# Patient Record
Sex: Female | Born: 1945 | Race: White | Hispanic: No | Smoking: Never smoker
Health system: Southern US, Community
[De-identification: ages and names within clinical notes are randomized; demographics above are authoritative.]

## PROBLEM LIST (undated history)

## (undated) DIAGNOSIS — M1990 Unspecified osteoarthritis, unspecified site: Secondary | ICD-10-CM

## (undated) DIAGNOSIS — I219 Acute myocardial infarction, unspecified: Secondary | ICD-10-CM

## (undated) DIAGNOSIS — K219 Gastro-esophageal reflux disease without esophagitis: Secondary | ICD-10-CM

## (undated) DIAGNOSIS — T7840XA Allergy, unspecified, initial encounter: Secondary | ICD-10-CM

## (undated) DIAGNOSIS — C801 Malignant (primary) neoplasm, unspecified: Secondary | ICD-10-CM

## (undated) HISTORY — PX: BACK SURGERY: SHX140

## (undated) HISTORY — PX: OTHER SURGICAL HISTORY: SHX169

## (undated) HISTORY — DX: Unspecified osteoarthritis, unspecified site: M19.90

## (undated) HISTORY — PX: SHOULDER SURGERY: SHX246

## (undated) HISTORY — DX: Allergy, unspecified, initial encounter: T78.40XA

## (undated) HISTORY — PX: CHOLECYSTECTOMY: SHX55

## (undated) HISTORY — PX: CARDIAC CATHETERIZATION: SHX172

## (undated) HISTORY — PX: BLADDER REPAIR: SHX76

---

## 2013-01-10 HISTORY — PX: COLONOSCOPY: SHX174

## 2017-02-27 ENCOUNTER — Inpatient Hospital Stay: Payer: Medicare Other | Admitting: Hematology and Oncology

## 2017-03-01 ENCOUNTER — Encounter: Payer: Self-pay | Admitting: Hematology and Oncology

## 2017-03-01 ENCOUNTER — Inpatient Hospital Stay: Payer: Medicare Other | Attending: Hematology and Oncology | Admitting: Hematology and Oncology

## 2017-03-01 VITALS — BP 142/94 | HR 73 | Temp 97.8°F | Resp 20 | Ht 62.5 in | Wt 155.4 lb

## 2017-03-01 DIAGNOSIS — C9190 Lymphoid leukemia, unspecified not having achieved remission: Secondary | ICD-10-CM | POA: Diagnosis not present

## 2017-03-01 DIAGNOSIS — Z79899 Other long term (current) drug therapy: Secondary | ICD-10-CM

## 2017-03-01 DIAGNOSIS — C9110 Chronic lymphocytic leukemia of B-cell type not having achieved remission: Secondary | ICD-10-CM | POA: Diagnosis present

## 2017-03-01 DIAGNOSIS — M1990 Unspecified osteoarthritis, unspecified site: Secondary | ICD-10-CM | POA: Diagnosis not present

## 2017-03-01 NOTE — Progress Notes (Signed)
Patient here today as new evaluation regarding CLL.  Patient referred by Burman Freestone, NP @ [facility].  Patient moved here from [place].  She is willing to sign release of information to have her records sent here.  Patient currently living with her daughter.

## 2017-03-01 NOTE — Progress Notes (Signed)
[place]

## 2017-03-01 NOTE — Progress Notes (Addendum)
[facility] day:  03/01/2017  Chief Complaint: Cheryl Briggs is a 72 y.o. female with CLL who is referred in consultation by Burman Freestone, NP for assessment and management.  HPI:  The patient was diagnosed with CLL in roughly 2015.  She presented with an elevated WBC.  She has been followed annually by oncology for several years.  WBC has been stable at < 14,000.  She denies any anemia or thrombocytopenia.  She has required no treatment.  CBC on 11/21/2016 revealed a hematocrit of 43.2, hemoglobin 14.6, MCV 90.8, platelets 376,000, WBC 14,800 with an ANC of 5210.  Absolute lymphocyte count (ALC) was 8332.  Creatinine was 0.80.  LFTs were normal.  CBC on 02/21/2017 revealed a hematocrit of 43.2, hemoglobin 14.6, MCV 91.8, platelets 330,000, WBC 14,300 with an Howard of 5019.  ALC was 8380.  Creatinine was 0.73.  LFTs were normal.  Symptomatically, patient is doing well overall. She denies acute concerns. Patient has no B symptoms. She has not had any recent infections. Patient has chronic osteoarthritis. She advises that all of her major joints (shoulders, hands, hip, back, and knee) are painful. Patient denies bleeding; no hematochezia, melena, or vaginal bleeding.  Last colonoscopy was in 2015.  Her mammogram is due.  Patient is eating well. Her weight has been stable. Patient denies pain in the clinic today.   Family history is notable for a sister with a mass in her colon or stomach.  Her mother had "cancer in her eye".   Past Medical History:  Diagnosis Date  . Allergy   . Arthritis     Past Surgical History:  Procedure Laterality Date  . BACK SURGERY    . BLADDER REPAIR    . carpel tunnell Bilateral   . CHOLECYSTECTOMY    . COLONOSCOPY  2015  . SHOULDER SURGERY Bilateral     History reviewed. No pertinent family history.  Social History:  reports that  has never smoked. She does not have any smokeless tobacco history on file.  She reports that she does not drink alcohol or use drugs.  The patient previously lived in [place], [place] for the past 40+ years.  She moved to [place] to be closer to her daughter. 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  Allergies: Not on File  Current Medications: Current Outpatient Medications  Medication Sig Dispense Refill  . cyclobenzaprine (FLEXERIL) 10 MG tablet Take 10 mg by mouth 3 (three) times daily as needed for muscle spasms.    . diclofenac (VOLTAREN) 75 MG EC tablet Take 75 mg by mouth daily.    . diclofenac sodium (VOLTAREN) 1 % GEL Apply topically 4 (four) times daily.    . metoprolol succinate (TOPROL-XL) 50 MG 24 hr tablet Take 50 mg by mouth daily.    . Multiple Vitamins-Minerals (MULTIVITAMIN WITH MINERALS) tablet Take 1 tablet by mouth daily.    . simvastatin (ZOCOR) 40 MG tablet Take 40 mg by mouth daily.     No current facility-administered medications for this visit.     Review of Systems:  GENERAL:  Feels good.  No fevers, sweats or weight loss. PERFORMANCE STATUS (ECOG):  0 HEENT:  New glasses.  No visual changes, runny nose, sore throat, mouth sores or tenderness. Lungs: No shortness of breath or cough.  No hemoptysis. Cardiac:  No chest pain, palpitations, orthopnea, or PND. Breasts:  No breast concerns.  Mammogram due. GI:  No nausea, vomiting, diarrhea, constipation, melena or hematochezia.  Colonoscopy 2015.   GU:  No urgency, frequency, dysuria, or hematuria. Musculoskeletal:  Osteoarthritis (shoulder, hands, hip, back, and knee).  No muscle tenderness. Extremities:  No pain or swelling. Skin:  No rashes or skin changes. Neuro:  No headache, numbness or weakness, balance or coordination issues. Endocrine:  No diabetes, thyroid issues, hot flashes or night sweats. Psych:  No mood changes, depression or anxiety. Pain:  No focal pain. Review of systems:  All other systems reviewed and found to be  negative.  Physical Exam: Blood pressure (!) 142/94, pulse 73, temperature 97.8 F (36.6 C), temperature source Tympanic, resp. rate 20, height 5' 2.5" (1.588 m), weight 155 lb 6.8 oz (70.5 kg). GENERAL:  Well developed, well nourished, woman sitting comfortably in the exam room in no acute distress. MENTAL STATUS:  Alert and oriented to person, place and time. HEAD:  Short styled blonde hair.  Normocephalic, atraumatic, face symmetric, no Cushingoid features. EYES:  Brown eyes.  Pupils equal round and reactive to light and accomodation.  No conjunctivitis or scleral icterus. ENT:  Oropharynx clear without lesion.  Tongue normal. Mucous membranes moist.  RESPIRATORY:  Clear to auscultation without rales, wheezes or rhonchi. CARDIOVASCULAR:  Regular rate and rhythm without murmur, rub or gallop. ABDOMEN:  Soft, non-tender, with active bowel sounds, and no hepatosplenomegaly.  No masses. SKIN:  No rashes, ulcers or lesions. EXTREMITIES: No edema, no skin discoloration or tenderness.  No palpable cords. LYMPH NODES: No palpable cervical, supraclavicular, axillary or inguinal adenopathy  NEUROLOGICAL: Unremarkable. PSYCH:  Appropriate.   No visits with results within 3 Day(s) from this visit.  Latest known visit with results is:  No results found for any previous visit.     Assessment:  Cheryl Briggs is a 72 y.o. 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  Hematocrit and platelet count are normal.  WBC has been between 14,000 - 15,000.  She denies any issues with infections.  Symptomatically, she denies any B symptoms.  She has osteoarthritis in multiple joints.  Exam reveals no adenopathy or hepatosplenomegaly.  Plan: 1.  Discuss diagnosis, staging, and management of CLL.  Discuss indications for treatment (B symptoms, rapidly increased WBC, recurrent infections, anemia, thrombocytopenia, disfiguring adenopathy or adenopathy causing compromised organ function).  2.  Obtain  records from [place]. 3.  RTC in 1 year for MD assessment and labs (CBC with diff).   Honor Loh, NP  03/01/2017, 2:22 PM   I saw and evaluated the patient, participating in the key portions of the service and reviewing pertinent diagnostic studies and records.  I reviewed the nurse practitioner's note and agree with the findings and the plan.  The assessment and plan were discussed with the patient.  Several questions were asked by the patient and answered.   Nolon Stalls, MD 03/01/2017,2:22 PM

## 2017-03-03 ENCOUNTER — Encounter: Payer: Self-pay | Admitting: Hematology and Oncology

## 2017-03-06 ENCOUNTER — Other Ambulatory Visit: Payer: Self-pay | Admitting: Family

## 2017-03-06 DIAGNOSIS — Z1239 Encounter for other screening for malignant neoplasm of breast: Secondary | ICD-10-CM

## 2017-03-15 ENCOUNTER — Ambulatory Visit
Admission: RE | Admit: 2017-03-15 | Discharge: 2017-03-15 | Disposition: A | Discharge status: Home / Self Care | Payer: Medicare Other | Source: Ambulatory Visit | Attending: Family | Admitting: Family

## 2017-03-15 ENCOUNTER — Inpatient Hospital Stay: Admission: RE | Admit: 2017-03-15 | Payer: Medicare Other | Source: Ambulatory Visit

## 2017-03-15 ENCOUNTER — Encounter: Payer: Self-pay | Admitting: Radiology

## 2017-03-15 DIAGNOSIS — Z1239 Encounter for other screening for malignant neoplasm of breast: Secondary | ICD-10-CM

## 2017-03-15 DIAGNOSIS — Z1231 Encounter for screening mammogram for malignant neoplasm of breast: Secondary | ICD-10-CM | POA: Diagnosis not present

## 2017-03-15 HISTORY — DX: Malignant (primary) neoplasm, unspecified: C80.1

## 2017-03-19 ENCOUNTER — Encounter: Payer: Self-pay | Admitting: Hematology and Oncology

## 2017-03-21 ENCOUNTER — Other Ambulatory Visit: Payer: Self-pay | Admitting: *Deleted

## 2017-03-21 ENCOUNTER — Inpatient Hospital Stay
Admission: RE | Admit: 2017-03-21 | Discharge: 2017-03-21 | Disposition: A | Discharge status: Home / Self Care | Payer: Self-pay | Source: Ambulatory Visit | Attending: *Deleted | Admitting: *Deleted

## 2017-03-21 DIAGNOSIS — Z9289 Personal history of other medical treatment: Secondary | ICD-10-CM

## 2017-10-06 ENCOUNTER — Emergency Department
Admission: EM | Admit: 2017-10-06 | Discharge: 2017-10-06 | Disposition: A | Discharge status: Home / Self Care | Payer: Medicare Other | Attending: Student in an Organized Health Care Education/Training Program | Admitting: Student in an Organized Health Care Education/Training Program

## 2017-10-06 ENCOUNTER — Emergency Department: Payer: Medicare Other

## 2017-10-06 ENCOUNTER — Encounter: Payer: Self-pay | Admitting: Emergency Medicine

## 2017-10-06 DIAGNOSIS — C9110 Chronic lymphocytic leukemia of B-cell type not having achieved remission: Secondary | ICD-10-CM | POA: Insufficient documentation

## 2017-10-06 DIAGNOSIS — J40 Bronchitis, not specified as acute or chronic: Secondary | ICD-10-CM

## 2017-10-06 DIAGNOSIS — Z79899 Other long term (current) drug therapy: Secondary | ICD-10-CM | POA: Diagnosis not present

## 2017-10-06 DIAGNOSIS — R05 Cough: Secondary | ICD-10-CM | POA: Diagnosis present

## 2017-10-06 DIAGNOSIS — J209 Acute bronchitis, unspecified: Secondary | ICD-10-CM | POA: Diagnosis not present

## 2017-10-06 LAB — COMPREHENSIVE METABOLIC PANEL
ALK PHOS: 75 U/L (ref 38–126)
ALT: 29 U/L (ref 0–44)
AST: 36 U/L (ref 15–41)
Albumin: 4.2 g/dL (ref 3.5–5.0)
Anion gap: 8 (ref 5–15)
BUN: 9 mg/dL (ref 8–23)
CALCIUM: 10 mg/dL (ref 8.9–10.3)
CO2: 27 mmol/L (ref 22–32)
CREATININE: 0.81 mg/dL (ref 0.44–1.00)
Chloride: 104 mmol/L (ref 98–111)
GFR calc Af Amer: >60 mL/min (ref >60)
GFR calc non Af Amer: >60 mL/min (ref >60)
Glucose, Bld: 107 mg/dL — ABNORMAL HIGH (ref 70–99)
Potassium: 4.4 mmol/L (ref 3.5–5.1)
Sodium: 139 mmol/L (ref 135–145)
Total Bilirubin: 0.6 mg/dL (ref 0.3–1.2)
Total Protein: 7.6 g/dL (ref 6.5–8.1)

## 2017-10-06 LAB — CBC
HEMATOCRIT: 44.8 % (ref 35.0–47.0)
HEMOGLOBIN: 15.4 g/dL (ref 12.0–16.0)
MCH: 32.7 pg (ref 26.0–34.0)
MCHC: 34.3 g/dL (ref 32.0–36.0)
MCV: 95.2 fL (ref 80.0–100.0)
Platelets: 345 10*3/uL (ref 150–440)
RBC: 4.71 MIL/uL (ref 3.80–5.20)
RDW: 13.1 % (ref 11.5–14.5)
WBC: 14.3 10*3/uL — ABNORMAL HIGH (ref 3.6–11.0)

## 2017-10-06 MED ORDER — ALBUTEROL SULFATE HFA 108 (90 BASE) MCG/ACT IN AERS: 2.0000 | INHALATION_SPRAY | Freq: Four times a day (QID) | RESPIRATORY_TRACT | 2 refills | Status: DC | PRN

## 2017-10-06 MED ORDER — PREDNISONE 20 MG PO TABS
40.0000 mg | ORAL_TABLET | Freq: Once | ORAL | Status: AC
  Administered 2017-10-06: 40 mg via ORAL
  Filled 2017-10-06: qty 2

## 2017-10-06 MED ORDER — PREDNISONE 20 MG PO TABS: 40.0000 mg | ORAL_TABLET | Freq: Every day | ORAL | 0 refills | Status: AC

## 2017-10-06 MED ORDER — AEROCHAMBER PLUS FLO-VU MEDIUM MISC
1.0000 | Freq: Once | Status: DC
  Filled 2017-10-06: qty 1

## 2017-10-06 MED ORDER — AEROCHAMBER MV MISC: 0 refills | Status: DC

## 2017-10-06 MED ORDER — ALBUTEROL SULFATE (2.5 MG/3ML) 0.083% IN NEBU
5.0000 mg | INHALATION_SOLUTION | Freq: Once | RESPIRATORY_TRACT | Status: AC
  Administered 2017-10-06: 5 mg via RESPIRATORY_TRACT
  Filled 2017-10-06: qty 6

## 2017-10-06 MED ORDER — IPRATROPIUM-ALBUTEROL 0.5-2.5 (3) MG/3ML IN SOLN
3.0000 mL | Freq: Once | RESPIRATORY_TRACT | Status: AC
  Administered 2017-10-06: 3 mL via RESPIRATORY_TRACT
  Filled 2017-10-06: qty 3

## 2017-10-06 NOTE — ED Provider Notes (Signed)
[facility] Emergency Department Provider Note    First MD Initiated Contact with Patient 10/06/17 1310     (approximate)  I have reviewed the triage vital signs and the nursing notes.   HISTORY  Chief Complaint Cough and Shortness of Breath    HPI Cheryl Briggs is a 72 y.o. female history of bronchitis presents the ER with several days of cough and shortness of breath.  Patient was seen at her family practice office on Tuesday and started on Levaquin.  Symptoms have not improved since then.  No fevers.  Denies any chest pain.  States she is coughing up yellow phlegm.  Patient arrives to the ER was given a nebulizer treatment with significant improvement in her symptoms.  Was not discharged or sent home from family practice clinic with inhalers or steroids.  States that she has had steroids in the past but has some intolerances to them as they cause redness in her face.  No true allergy before.  Has tolerated lower doses of steroids.    Past Medical History:  Diagnosis Date  . Allergy   . Arthritis   . Cancer ([place])    CLL   Family History  Problem Relation Age of Onset  . Breast cancer Neg Hx    Past Surgical History:  Procedure Laterality Date  . BACK SURGERY    . BLADDER REPAIR    . carpel tunnell Bilateral   . CHOLECYSTECTOMY    . COLONOSCOPY  2015  . SHOULDER SURGERY Bilateral    Patient Active Problem List   Diagnosis Date Noted  . CLL (chronic lymphocytic leukemia) ([place]) 03/01/2017      Prior to Admission medications   Medication Sig Start Date End Date Taking? Authorizing Provider  albuterol (PROVENTIL HFA;VENTOLIN HFA) 108 (90 Base) MCG/ACT inhaler Inhale 2 puffs into the lungs every 6 (six) hours as needed for wheezing or shortness of breath. 10/06/17   Merlyn Lot, MD  cyclobenzaprine (FLEXERIL) 10 MG tablet Take 10 mg by mouth 3 (three) times daily as needed for muscle spasms.    [provider]  diclofenac  (VOLTAREN) 75 MG EC tablet Take 75 mg by mouth daily. 02/20/17   [provider]  diclofenac sodium (VOLTAREN) 1 % GEL Apply topically 4 (four) times daily.    [provider]  metoprolol succinate (TOPROL-XL) 50 MG 24 hr tablet Take 50 mg by mouth daily. 01/19/17   [provider]  Multiple Vitamins-Minerals (MULTIVITAMIN WITH MINERALS) tablet Take 1 tablet by mouth daily.    [provider]  predniSONE (DELTASONE) 20 MG tablet Take 2 tablets (40 mg total) by mouth daily for 5 days. 10/06/17 10/11/17  Merlyn Lot, MD  simvastatin (ZOCOR) 40 MG tablet Take 40 mg by mouth daily.    [provider]    Allergies Sulfa antibiotics; Cymbalta [duloxetine hcl]; and Prednisone    Social History Social History   Tobacco Use  . Smoking status: Never Smoker  . Smokeless tobacco: Never Used  Substance Use Topics  . Alcohol use: No    Frequency: Never  . Drug use: No    Review of Systems Patient denies headaches, rhinorrhea, blurry vision, numbness, shortness of breath, chest pain, edema, cough, abdominal pain, nausea, vomiting, diarrhea, dysuria, fevers, rashes or hallucinations unless otherwise stated above in HPI. ____________________________________________   PHYSICAL EXAM:  VITAL SIGNS: Vitals:   10/06/17 1040  BP: (!) 155/141  Pulse: 94  Resp: 18  Temp: 97.8 F (  36.6 C)  SpO2: 98%    Constitutional: Alert and oriented.  Eyes: Conjunctivae are normal.  Head: Atraumatic. Nose: No congestion/rhinnorhea. Mouth/Throat: Mucous membranes are moist.   Neck: No stridor. Painless ROM.  Cardiovascular: Normal rate, regular rhythm. Grossly normal heart sounds.  Good peripheral circulation. Respiratory: Normal respiratory effort.  No retractions. Lungs with coarse bibasilar breath sounds with expiratory wheeze. Gastrointestinal: Soft and nontender. No distention. No abdominal bruits. No CVA tenderness. Genitourinary:  Musculoskeletal: No  lower extremity tenderness nor edema.  No joint effusions. Neurologic:  Normal speech and language. No gross focal neurologic deficits are appreciated. No facial droop Skin:  Skin is warm, dry and intact. No rash noted. Psychiatric: Mood and affect are normal. Speech and behavior are normal.  ____________________________________________   LABS (all labs ordered are listed, but only abnormal results are displayed)  Results for orders placed or performed during the hospital encounter of 10/06/17 (from the past 24 hour(s))  CBC     Status: Abnormal   Collection Time: 10/06/17 10:43 AM  Result Value Ref Range   WBC 14.3 (H) 3.6 - 11.0 K/uL   RBC 4.71 3.80 - 5.20 MIL/uL   Hemoglobin 15.4 12.0 - 16.0 g/dL   HCT 44.8 35.0 - 47.0 %   MCV 95.2 80.0 - 100.0 fL   MCH 32.7 26.0 - 34.0 pg   MCHC 34.3 32.0 - 36.0 g/dL   RDW 13.1 11.5 - 14.5 %   Platelets 345 150 - 440 K/uL  Comprehensive metabolic panel     Status: Abnormal   Collection Time: 10/06/17 10:43 AM  Result Value Ref Range   Sodium 139 135 - 145 mmol/L   Potassium 4.4 3.5 - 5.1 mmol/L   Chloride 104 98 - 111 mmol/L   CO2 27 22 - 32 mmol/L   Glucose, Bld 107 (H) 70 - 99 mg/dL   BUN 9 8 - 23 mg/dL   Creatinine, Ser 0.81 0.44 - 1.00 mg/dL   Calcium 10.0 8.9 - 10.3 mg/dL   Total Protein 7.6 6.5 - 8.1 g/dL   Albumin 4.2 3.5 - 5.0 g/dL   AST 36 15 - 41 U/L   ALT 29 0 - 44 U/L   Alkaline Phosphatase 75 38 - 126 U/L   Total Bilirubin 0.6 0.3 - 1.2 mg/dL   GFR calc non Af Amer >60 >60 mL/min   GFR calc Af Amer >60 >60 mL/min   Anion gap 8 5 - 15   ____________________________________________ ____________________________________________  RADIOLOGY  I personally reviewed all radiographic images ordered to evaluate for the above acute complaints and reviewed radiology reports and findings.  These findings were personally discussed with the patient.  Please see medical record for radiology  report.  ____________________________________________   PROCEDURES  Procedure(s) performed:  Procedures    Critical Care performed: no ____________________________________________   INITIAL IMPRESSION / ASSESSMENT AND PLAN / ED COURSE  Pertinent labs & imaging results that were available during my care of the patient were reviewed by me and considered in my medical decision making (see chart for details).   DDX: Asthma, copd, CHF, pna, ptx, malignancy, Pe, anemia   Cheryl Briggs is a 72 y.o. who presents to the ED with cough and symptoms as described above.  She is afebrile hemodynamically stable with no hypoxia.  Exam is most clinically consistent with acute bronchitis.  I do not appreciate any evidence of consolidation on chest x-ray.  Not clinically consistent with PE.  Given her significant improvement with  nebulizers will give additional dose of nebulizer and low-dose steroid.  Will ambulate with pulse oximeter to evaluate for any evidence of hypoxia.  Clinical Course as of Oct 06 1420  Fri Oct 06, 2017  1418 Patient reassessed.  She was able to ambulate without any hypoxia.  Symptoms improving after nebulizer treatments.  At this point do believe she stable and appropriate for trial of outpatient management now she is been appropriately started on inhalers and steroids.  Discussed signs and symptoms for which the patient should return to the Er.  Have discussed with the patient and available family all diagnostics and treatments performed thus far and all questions were answered to the best of my ability. The patient demonstrates understanding and agreement with plan.    [PR]    Clinical Course User Index [PR] Merlyn Lot, MD     As part of my medical decision making, I reviewed the following data within the [facility] notes reviewed and incorporated, Labs reviewed, notes from prior ED visits and [place] Controlled Substance  Database   ____________________________________________   FINAL CLINICAL IMPRESSION(S) / ED DIAGNOSES  Final diagnoses:  Bronchitis      NEW MEDICATIONS STARTED DURING THIS VISIT:  New Prescriptions   ALBUTEROL (PROVENTIL HFA;VENTOLIN HFA) 108 (90 BASE) MCG/ACT INHALER    Inhale 2 puffs into the lungs every 6 (six) hours as needed for wheezing or shortness of breath.   PREDNISONE (DELTASONE) 20 MG TABLET    Take 2 tablets (40 mg total) by mouth daily for 5 days.     Note:  This document was prepared using Dragon voice recognition software and may include unintentional dictation errors.    Merlyn Lot, MD 10/06/17 [phone]

## 2017-10-06 NOTE — ED Triage Notes (Signed)
Pt reports productive cough and SOB for a couple of weeks. Pt was dx'd with pneumonia on Monday and given abx but the sx's have gotten worse.

## 2017-10-06 NOTE — ED Notes (Signed)
Pt called from [facility] to treatment room, no response 

## 2018-02-28 ENCOUNTER — Inpatient Hospital Stay: Payer: Medicare Other

## 2018-02-28 ENCOUNTER — Inpatient Hospital Stay: Payer: Medicare Other | Attending: Hematology and Oncology | Admitting: Hematology and Oncology

## 2018-02-28 ENCOUNTER — Encounter: Payer: Self-pay | Admitting: Hematology and Oncology

## 2018-02-28 VITALS — BP 143/85 | HR 86 | Temp 97.7°F | Resp 16 | Wt 162.0 lb

## 2018-02-28 DIAGNOSIS — M1990 Unspecified osteoarthritis, unspecified site: Secondary | ICD-10-CM | POA: Diagnosis not present

## 2018-02-28 DIAGNOSIS — C9110 Chronic lymphocytic leukemia of B-cell type not having achieved remission: Secondary | ICD-10-CM | POA: Diagnosis not present

## 2018-02-28 DIAGNOSIS — Z791 Long term (current) use of non-steroidal anti-inflammatories (NSAID): Secondary | ICD-10-CM | POA: Insufficient documentation

## 2018-02-28 DIAGNOSIS — J069 Acute upper respiratory infection, unspecified: Secondary | ICD-10-CM

## 2018-02-28 DIAGNOSIS — Z79899 Other long term (current) drug therapy: Secondary | ICD-10-CM | POA: Diagnosis not present

## 2018-02-28 LAB — CBC WITH DIFFERENTIAL/PLATELET
Abs Immature Granulocytes: 0.03 10*3/uL (ref 0.00–0.07)
Basophils Absolute: 0.1 10*3/uL (ref 0.0–0.1)
Basophils Relative: 1 %
Eosinophils Absolute: 0.4 10*3/uL (ref 0.0–0.5)
Eosinophils Relative: 3 %
HCT: 45.4 % (ref 36.0–46.0)
Hemoglobin: 14.9 g/dL (ref 12.0–15.0)
Immature Granulocytes: 0 %
Lymphocytes Relative: 55 %
Lymphs Abs: 7.1 10*3/uL — ABNORMAL HIGH (ref 0.7–4.0)
MCH: 32.5 pg (ref 26.0–34.0)
MCHC: 32.8 g/dL (ref 30.0–36.0)
MCV: 98.9 fL (ref 80.0–100.0)
Monocytes Absolute: 0.6 10*3/uL (ref 0.1–1.0)
Monocytes Relative: 5 %
Neutro Abs: 4.5 10*3/uL (ref 1.7–7.7)
Neutrophils Relative %: 36 %
Platelets: 291 10*3/uL (ref 150–400)
RBC: 4.59 MIL/uL (ref 3.87–5.11)
RDW: 13.2 % (ref 11.5–15.5)
WBC: 12.7 10*3/uL — ABNORMAL HIGH (ref 4.0–10.5)
nRBC: 0 % (ref 0.0–0.2)

## 2018-02-28 NOTE — Progress Notes (Signed)
[facility] day:  02/28/2018  Chief Complaint: Cheryl Briggs is a 73 y.o. female with CLL who is seen for 1 year assessment.  HPI:  The patient last seen in the medical oncology clinic on 03/01/2017 for initial consultation.  She had been diagnosed with CLL in 2015.  WBC had been stable at < 14,000.  She denied any B symptoms.  Exam revealed no adenopathy or hepatosplenomegaly.  CBC on 10/06/2017 revealed a hematocrit of 44.8, hemoglobin 15.4, MCV 95.2, platelets 345,000, WBC 14,300.  Creatinine was 0.81.  LFTs were normal.  During the interim, patient has had several recurrent infections since 10/2017. Patient did not require admission for any of these infections. She denies fevers. Patient notes that she sat beside someone at church who was sick on Sunday, which has caused her to developed a recurrence of mild upper respiratory symptoms. She denies any significant cough. She has not experienced and sweats or weight loss. She denies any new areas of bruising, bleeding, or palpable adenopathy.   Patient advises that she maintains an adequate appetite. She is eating well. Weight today is 162 lb 0.6 oz (73.5 kg), which compared to her last visit to the clinic, represents a 7 pound increase.    Patient denies pain in the clinic today.   Past Medical History:  Diagnosis Date  . Allergy   . Arthritis   . Cancer ([place])    CLL    Past Surgical History:  Procedure Laterality Date  . BACK SURGERY    . BLADDER REPAIR    . carpel tunnell Bilateral   . CHOLECYSTECTOMY    . COLONOSCOPY  2015  . SHOULDER SURGERY Bilateral     Family History  Problem Relation Age of Onset  . Breast cancer Neg Hx     Social History:  reports that she has never smoked. She has never used smokeless tobacco. She reports that she does not drink alcohol or use drugs.  The patient previously lived in [place], [place] for the past 40+ years.  She moved to [place]  to be closer to her daughter. 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  Allergies:  Allergies  Allergen Reactions  . Corticosteroids     Other reaction(s): Headache  . Sulfa Antibiotics Anaphylaxis  . Clarithromycin     Other reaction(s): Other (See Comments) Rectal bleeding   . Cymbalta [Duloxetine Hcl] Diarrhea  . Prednisone Other (See Comments)    Color changes in face, headache and difficulty walking    Current Medications: Current Outpatient Medications  Medication Sig Dispense Refill  . albuterol (PROVENTIL HFA;VENTOLIN HFA) 108 (90 Base) MCG/ACT inhaler Inhale 2 puffs into the lungs every 6 (six) hours as needed for wheezing or shortness of breath. 1 Inhaler 2  . Bioflavonoid Products (ESTER C PO) Take 1,000 mg by mouth daily.    . Calcium Carb-Cholecalciferol (OS-CAL CALCIUM + D3) 500-200 MG-UNIT TABS Take 1 tablet by mouth daily.     . Chlorpheniramine Maleate (CHLOR-TABLETS PO) Take 1 tablet by mouth as needed.     . Cholecalciferol (VITAMIN D-1000 MAX ST) 25 MCG (1000 UT) tablet Take 3,000 Units by mouth daily.    Marland Kitchen desonide (DESOWEN) 0.05 % ointment desonide 0.05 % topical ointment    . diclofenac (VOLTAREN) 75 MG EC tablet Take 75 mg by mouth 2 (two) times daily.     . diclofenac sodium (VOLTAREN) 1 % GEL Apply  topically 4 (four) times daily.    Marland Kitchen gabapentin (NEURONTIN) 300 MG capsule Take 300 mg by mouth 2 (two) times daily.    . metoprolol succinate (TOPROL-XL) 50 MG 24 hr tablet Take 50 mg by mouth daily.    . Multiple Vitamins-Minerals (MULTIVITAMIN WITH MINERALS) tablet Take 1 tablet by mouth daily.    . Omeprazole (PRILOSEC PO) Take 1 tablet by mouth daily.     . simvastatin (ZOCOR) 40 MG tablet Take 20 mg by mouth daily.     Marland Kitchen Spacer/Aero-Holding Chambers (AEROCHAMBER MV) inhaler Use as instructed 1 each 0   No current facility-administered medications for this visit.     Review of Systems:  GENERAL:  No concerns.  No  fevers, sweats.  Weight up 7 pounds. PERFORMANCE STATUS (ECOG):  0 HEENT:  No visual changes, runny nose, sore throat, mouth sores or tenderness. Lungs:  Interval URI.  No shortness of breath or cough.  No hemoptysis. Cardiac:  No chest pain, palpitations, orthopnea, or PND. GI:  No nausea, vomiting, diarrhea, constipation, melena or hematochezia.  Colonoscopy 2015. GU:  No urgency, frequency, dysuria, or hematuria. Musculoskeletal:  Osteoarthritis.  No muscle tenderness. Extremities:  No pain or swelling. Skin:  No rashes or skin changes. Neuro:  No headache, numbness or weakness, balance or coordination issues. Endocrine:  No diabetes, thyroid issues, hot flashes or night sweats. Psych:  No mood changes, depression or anxiety. Pain:  No focal pain. Review of systems:  All other systems reviewed and found to be negative.   Physical Exam: Blood pressure (!) 143/85, pulse 86, temperature 97.7 F (36.5 C), temperature source Oral, resp. rate 16, weight 162 lb 0.6 oz (73.5 kg), SpO2 96 %. GENERAL:  Well developed, well nourished, woman sitting comfortably in the exam room in no acute distress. MENTAL STATUS:  Alert and oriented to person, place and time. HEAD:  Short styled blonde hair.  Normocephalic, atraumatic, face symmetric, no Cushingoid features. EYES:  Brown eyes.  Pupils equal round and reactive to light and accomodation.  No conjunctivitis or scleral icterus. ENT:  Oropharynx clear without lesion.  Tongue normal.  Partial metal plate.  Mucous membranes moist.  RESPIRATORY:  Clear to auscultation without rales, wheezes or rhonchi. CARDIOVASCULAR:  Regular rate and rhythm without murmur, rub or gallop. ABDOMEN:  Soft, non-tender, with active bowel sounds, and no hepatosplenomegaly.  No masses. SKIN:  No rashes, ulcers or lesions. EXTREMITIES: No edema, no skin discoloration or tenderness.  No palpable cords. LYMPH NODES: No palpable cervical, supraclavicular, axillary or inguinal  adenopathy  NEUROLOGICAL: Unremarkable. PSYCH:  Appropriate.    Appointment on 02/28/2018  Component Date Value Ref Range Status  . WBC 02/28/2018 12.7* 4.0 - 10.5 K/uL Final  . RBC 02/28/2018 4.59  3.87 - 5.11 MIL/uL Final  . Hemoglobin 02/28/2018 14.9  12.0 - 15.0 g/dL Final  . HCT 02/28/2018 45.4  36.0 - 46.0 % Final  . MCV 02/28/2018 98.9  80.0 - 100.0 fL Final  . MCH 02/28/2018 32.5  26.0 - 34.0 pg Final  . MCHC 02/28/2018 32.8  30.0 - 36.0 g/dL Final  . RDW 02/28/2018 13.2  11.5 - 15.5 % Final  . Platelets 02/28/2018 291  150 - 400 K/uL Final  . nRBC 02/28/2018 0.0  0.0 - 0.2 % Final  . Neutrophils Relative % 02/28/2018 36  % Final  . Neutro Abs 02/28/2018 4.5  1.7 - 7.7 K/uL Final  . Lymphocytes Relative 02/28/2018 55  % Final  .  Lymphs Abs 02/28/2018 7.1* 0.7 - 4.0 K/uL Final  . Monocytes Relative 02/28/2018 5  % Final  . Monocytes Absolute 02/28/2018 0.6  0.1 - 1.0 K/uL Final  . Eosinophils Relative 02/28/2018 3  % Final  . Eosinophils Absolute 02/28/2018 0.4  0.0 - 0.5 K/uL Final  . Basophils Relative 02/28/2018 1  % Final  . Basophils Absolute 02/28/2018 0.1  0.0 - 0.1 K/uL Final  . Immature Granulocytes 02/28/2018 0  % Final  . Abs Immature Granulocytes 02/28/2018 0.03  0.00 - 0.07 K/uL Final   Performed at [facility], [address], [place], [place] [address]     Assessment:  Winry Egnew is a 73 y.o. 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  Hematocrit and platelet count are normal.  WBC has ranged between 14,000 - 15,000.  Symptomatically, she denies any B symptoms.  She has had recurrent URI since 10/2017.  Exam reveals no adenopathy or hepatosplenomegaly.  Plan: 1.   Labs today:  CBC with diff, IgG, IgA. 2.   Chronic lymphocytic leukemia (CLL)  Hematocrit 45.4.  Hemoglobin 14.9.  Platelets 291,000.  WBC 12,700 ([place] 4500; ALC 7100)  Counts good.  Discuss interval infections.  None required hospitalization.  Discuss checking IgG  level (and IgA in the event of need for IVIG).  Discuss indications for treatment.  Continue surveillance. 3.   RTC in 1 year for MD assessment and labs (CBC with diff).   Honor Loh, NP  02/28/2018, 11:25 AM   I saw and evaluated the patient, participating in the key portions of the service and reviewing pertinent diagnostic studies and records.  I reviewed the nurse practitioner's note and agree with the findings and the plan.  The assessment and plan were discussed with the patient.  A few questions were asked by the patient and answered.   Nolon Stalls, MD 02/28/2018,11:25 AM

## 2018-02-28 NOTE — Progress Notes (Signed)
Pt here for follow up. Denies any concerns at this time.  

## 2018-03-01 LAB — IGA: IGA: 312 mg/dL (ref 64–422)

## 2018-03-01 LAB — IGG: IgG (Immunoglobin G), Serum: 1167 mg/dL (ref 700–1600)

## 2018-03-15 ENCOUNTER — Other Ambulatory Visit: Payer: Self-pay | Admitting: Family

## 2018-03-15 DIAGNOSIS — Z1231 Encounter for screening mammogram for malignant neoplasm of breast: Secondary | ICD-10-CM

## 2018-03-20 ENCOUNTER — Telehealth: Payer: Self-pay

## 2018-03-20 NOTE — Telephone Encounter (Signed)
Per patient request informed her of 02/28/2018 lab results. Patient verbalizes understanding and denies any further questions.

## 2018-03-20 NOTE — Telephone Encounter (Signed)
-----   Message from Secundino Ginger sent at 03/20/2018 12:23 PM EDT ----- Regarding: lab results Contact: [phone] Pt came 2/19 for labs. And would like results please call patient

## 2018-03-27 ENCOUNTER — Other Ambulatory Visit: Payer: Self-pay

## 2018-03-27 ENCOUNTER — Ambulatory Visit
Admission: RE | Admit: 2018-03-27 | Discharge: 2018-03-27 | Disposition: A | Discharge status: Home / Self Care | Payer: Medicare Other | Source: Ambulatory Visit | Attending: Family | Admitting: Family

## 2018-03-27 DIAGNOSIS — Z1231 Encounter for screening mammogram for malignant neoplasm of breast: Secondary | ICD-10-CM | POA: Insufficient documentation

## 2018-07-25 ENCOUNTER — Other Ambulatory Visit: Payer: Self-pay | Admitting: Obstetrics & Gynecology

## 2018-07-25 DIAGNOSIS — M810 Age-related osteoporosis without current pathological fracture: Secondary | ICD-10-CM

## 2018-08-03 ENCOUNTER — Other Ambulatory Visit: Payer: Medicare Other

## 2018-08-15 ENCOUNTER — Encounter (INDEPENDENT_AMBULATORY_CARE_PROVIDER_SITE_OTHER): Payer: Self-pay

## 2018-08-15 ENCOUNTER — Ambulatory Visit
Admission: RE | Admit: 2018-08-15 | Discharge: 2018-08-15 | Disposition: A | Discharge status: Home / Self Care | Payer: Medicare Other | Source: Ambulatory Visit | Attending: Obstetrics & Gynecology | Admitting: Obstetrics & Gynecology

## 2018-08-15 ENCOUNTER — Other Ambulatory Visit: Payer: Self-pay

## 2018-08-15 DIAGNOSIS — M810 Age-related osteoporosis without current pathological fracture: Secondary | ICD-10-CM

## 2019-02-28 ENCOUNTER — Inpatient Hospital Stay: Payer: Medicare Other | Admitting: Hematology and Oncology

## 2019-02-28 ENCOUNTER — Inpatient Hospital Stay: Payer: Medicare Other

## 2019-03-11 ENCOUNTER — Other Ambulatory Visit: Payer: Self-pay | Admitting: Family

## 2019-03-11 DIAGNOSIS — Z1231 Encounter for screening mammogram for malignant neoplasm of breast: Secondary | ICD-10-CM

## 2019-04-27 NOTE — Progress Notes (Signed)
[facility]  [address] [place], [place] [address] Phone: [phone]  Fax: [phone]   Clinic Day:  04/29/2019  Referring physician: Joyice Faster, FNP  Chief Complaint: Cheryl Briggs is a 74 y.o. 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  HPI: The patient was last seen in the medical oncology clinic on 02/28/2018. At that time, she denied any B symptoms. She had recurrent URI since 10/2017.  Exam revealed no adenopathy or hepatosplenomegaly. Hematocrit was 45.4, hemoglobin 14.9, platelets 291,000, WBC 12,700 (ANC 4,500; ALC 7,100).  IgG was 1,167. Surveillance continued.   Bilateral mammogram on 03/27/2018 revealed no malignancy.   Bone density on 08/15/2018 showed osteopenia with a T-score of -2.0 in the left neck femur.   During the interim, she felt "good" . She denies any infections. She denies any B symptoms. She denies any bleeding but some bruising. She denies any lumps or bumps. Patient has no breast concerns. She is scheduled for bilateral mammogram on 05/06/2019. She notes pain in her shoulders, hands, right hip and ankle secondary to osteoarthritis. She notes pain in her right wrist (new) secondary to osteoarthritis. She is active and walks everyday. She had her last COVID-19 vaccine on 03/20/2019 and felt "real bad" x 1 week afterwards. She notes after the first vaccine she had flu like symptoms.    Past Medical History:  Diagnosis Date  . Allergy   . Arthritis   . Cancer ([place])    CLL    Past Surgical History:  Procedure Laterality Date  . BACK SURGERY    . BLADDER REPAIR    . carpel tunnell Bilateral   . CHOLECYSTECTOMY    . COLONOSCOPY  2015  . SHOULDER SURGERY Bilateral     Family History  Problem Relation Age of Onset  . Breast cancer Neg Hx     Social History:  reports that she has never smoked. She has never used smokeless tobacco. She reports that she does not drink alcohol or use drugs. The  patient previously lived in [place], [place] for the past 40+ years.  She moved to [place] to be closer to her daughter. 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  Allergies:  Allergies  Allergen Reactions  . Corticosteroids     Other reaction(s): Headache  . Other Other (See Comments)  . Sulfa Antibiotics Anaphylaxis  . Clarithromycin     Other reaction(s): Other (See Comments) Rectal bleeding   . Cymbalta [Duloxetine Hcl] Diarrhea  . Prednisone Other (See Comments)    Color changes in face, headache and difficulty walking    Current Medications: Current Outpatient Medications  Medication Sig Dispense Refill  . Bioflavonoid Products (ESTER C PO) Take 1,000 mg by mouth daily.    . Calcium Carb-Cholecalciferol (OS-CAL CALCIUM + D3) 500-200 MG-UNIT TABS Take 1 tablet by mouth daily.     . Chlorpheniramine Maleate (CHLOR-TABLETS PO) Take 1 tablet by mouth as needed.     . Cholecalciferol (VITAMIN D-1000 MAX ST) 25 MCG (1000 UT) tablet Take 3,000 Units by mouth daily.    Marland Kitchen desonide (DESOWEN) 0.05 % ointment desonide 0.05 % topical ointment    . diclofenac (VOLTAREN) 75 MG EC tablet Take 75 mg by mouth 2 (two) times daily.     . diclofenac sodium (VOLTAREN) 1 % GEL Apply topically 4 (four) times daily.    Marland Kitchen gabapentin (NEURONTIN) 300 MG capsule Take 300 mg by  mouth 2 (two) times daily.    . metoprolol succinate (TOPROL-XL) 50 MG 24 hr tablet Take 50 mg by mouth daily.    . Multiple Vitamins-Minerals (MULTIVITAMIN WITH MINERALS) tablet Take 1 tablet by mouth daily.    . Omeprazole (PRILOSEC PO) Take 1 tablet by mouth daily.     . simvastatin (ZOCOR) 40 MG tablet Take 20 mg by mouth daily.     Marland Kitchen Spacer/Aero-Holding Chambers (AEROCHAMBER MV) inhaler Use as instructed 1 each 0  . albuterol (PROVENTIL HFA;VENTOLIN HFA) 108 (90 Base) MCG/ACT inhaler Inhale 2 puffs into the lungs every 6 (six) hours as needed for wheezing or shortness of breath.  (Patient not taking: Reported on 04/29/2019) 1 Inhaler 2   No current facility-administered medications for this visit.    Review of Systems  Constitutional: Positive for weight loss (4 lbs since 02/28/2018; intentional). Negative for chills, diaphoresis, fever and malaise/fatigue.       Feels "good". Active.  HENT: Negative for congestion, ear discharge, ear pain, hearing loss, nosebleeds, sinus pain, sore throat and tinnitus.   Eyes: Negative for blurred vision.  Respiratory: Negative for cough, hemoptysis, sputum production and shortness of breath.   Cardiovascular: Negative for chest pain, palpitations and leg swelling.  Gastrointestinal: Negative for abdominal pain, blood in stool, constipation, diarrhea, heartburn, melena, nausea and vomiting.       Colonoscopy 2015.  Genitourinary: Negative for dysuria, frequency, hematuria and urgency.  Musculoskeletal: Positive for joint pain (shoulders; hands; right ankle and hip, right wrist). Negative for back pain, myalgias and neck pain.       Osteoarthritis.  Skin: Negative for itching and rash.  Neurological: Negative for dizziness, tingling, sensory change, weakness and headaches.  Endo/Heme/Allergies: Bruises/bleeds easily (bruising only).  Psychiatric/Behavioral: Negative for depression and memory loss. The patient is not nervous/anxious and does not have insomnia.   All other systems reviewed and are negative.    Performance status (ECOG): 0  Vitals Blood pressure (!) 143/85, pulse 96, temperature (!) 97.5 F (36.4 C), temperature source Tympanic, resp. rate 18, height 5\' 2"  (1.575 m), weight 158 lb 2.9 oz (71.7 kg), SpO2 98 %.   Physical Exam  Constitutional: She is oriented to person, place, and time. She appears well-developed and well-nourished. No distress. Face mask in place.  HENT:  Head: Normocephalic and atraumatic.  Mouth/Throat: Oropharynx is clear and moist. No oropharyngeal exudate.  Short styled blonde hair.  Eyes:  Pupils are equal, round, and reactive to light. Conjunctivae and EOM are normal. No scleral icterus.  Brown eyes.  Cardiovascular: Normal rate, regular rhythm and normal heart sounds.  No murmur heard. Pulmonary/Chest: Effort normal and breath sounds normal. No respiratory distress. She has no wheezes. She has no rales. She exhibits no tenderness.  Abdominal: Soft. Bowel sounds are normal. She exhibits no distension. There is no abdominal tenderness. There is no rebound and no guarding.  Musculoskeletal:        General: No tenderness or edema. Normal range of motion.     Cervical back: Normal range of motion and neck supple.  Lymphadenopathy:    She has no cervical adenopathy.    She has no axillary adenopathy.       Right: No supraclavicular adenopathy present.       Left: No supraclavicular adenopathy present.  Neurological: She is alert and oriented to person, place, and time.  Skin: Skin is warm and dry. She is not diaphoretic.  Psychiatric: She has a normal mood and affect. Her  behavior is normal. Judgment and thought content normal.  Nursing note and vitals reviewed.   Appointment on 04/29/2019  Component Date Value Ref Range Status  . WBC 04/29/2019 14.1* 4.0 - 10.5 K/uL Final  . RBC 04/29/2019 4.55  3.87 - 5.11 MIL/uL Final  . Hemoglobin 04/29/2019 15.0  12.0 - 15.0 g/dL Final  . HCT 04/29/2019 44.3  36.0 - 46.0 % Final  . MCV 04/29/2019 97.4  80.0 - 100.0 fL Final  . MCH 04/29/2019 33.0  26.0 - 34.0 pg Final  . MCHC 04/29/2019 33.9  30.0 - 36.0 g/dL Final  . RDW 04/29/2019 12.7  11.5 - 15.5 % Final  . Platelets 04/29/2019 287  150 - 400 K/uL Final  . nRBC 04/29/2019 0.0  0.0 - 0.2 % Final   Performed at [facility], [address], [place], [place] [address]  . Neutrophils Relative % 04/29/2019 PENDING  % Incomplete  . Neutro Abs 04/29/2019 PENDING  1.7 - 7.7 K/uL Incomplete  . Band Neutrophils 04/29/2019 PENDING  % Incomplete  . Lymphocytes Relative  04/29/2019 PENDING  % Incomplete  . Lymphs Abs 04/29/2019 PENDING  0.7 - 4.0 K/uL Incomplete  . Monocytes Relative 04/29/2019 PENDING  % Incomplete  . Monocytes Absolute 04/29/2019 PENDING  0.1 - 1.0 K/uL Incomplete  . Eosinophils Relative 04/29/2019 PENDING  % Incomplete  . Eosinophils Absolute 04/29/2019 PENDING  0.0 - 0.5 K/uL Incomplete  . Basophils Relative 04/29/2019 PENDING  % Incomplete  . Basophils Absolute 04/29/2019 PENDING  0.0 - 0.1 K/uL Incomplete  . WBC Morphology 04/29/2019 PENDING   Incomplete  . RBC Morphology 04/29/2019 PENDING   Incomplete  . Smear Review 04/29/2019 PENDING   Incomplete  . Other 04/29/2019 PENDING  % Incomplete  . nRBC 04/29/2019 PENDING  0 /100 WBC Incomplete  . Metamyelocytes Relative 04/29/2019 PENDING  % Incomplete  . Myelocytes 04/29/2019 PENDING  % Incomplete  . Promyelocytes Relative 04/29/2019 PENDING  % Incomplete  . Blasts 04/29/2019 PENDING  % Incomplete  . Immature Granulocytes 04/29/2019 PENDING  % Incomplete  . Abs Immature Granulocytes 04/29/2019 PENDING  0.00 - 0.07 K/uL Incomplete    Assessment:  Cheryl Briggs is a 74 y.o. 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  Hematocrit and platelet count are normal.  WBC has ranged between 14,000 - 15,000.  Bilateral mammogram on 03/27/2018 revealed no malignancy.  Bone density on 08/15/2018 revealed osteopenia with a T-score of -2.0 in the left neck femur.  She had her last COVID-19 vaccine on 03/20/2019  Symptomatically, she feels good.  She denies any B symptoms.  Exam reveals no adenopathy or hepatosplenomegaly.  Plan: 1.   Labs today: CBC with diff. 2.   Chronic lymphocytic leukemia (CLL)             Hematocrit 44.3.  Hemoglobin 15.0.  Platelets 287,000.  WBC 18,100 ([place] 4200; ALC 8800)             Symptomatically, she is doing well.  She denies any fevers, sweats or weight loss.    Exam reveals no adenopathy or hepatosplenomegaly.    She has had no infections.    Counts  remain good.             Consider checking IgG level (and IgA in the event of need for IVIG).  No indication for treatment  Continue surveillance 3.   Health maintenance  Mammogram due. 4.   RTC in 1 year for MD assessment and labs (CBC with  diff).   I discussed the assessment and treatment plan with the patient.  The patient was provided an opportunity to ask questions and all were answered.  The patient agreed with the plan and demonstrated an understanding of the instructions.  The patient was advised to call back if the symptoms worsen or if the condition fails to improve as anticipated.    Lequita Asal, MD, PhD    04/29/2019, 11:34 AM  I, Selena Batten, am acting as scribe for [employer]. Mike Gip, MD, PhD.  I, Jesslynn Kruck C. Mike Gip, MD, have reviewed the above documentation for accuracy and completeness, and I agree with the above.

## 2019-04-29 ENCOUNTER — Inpatient Hospital Stay: Payer: Medicare Other | Attending: Hematology and Oncology

## 2019-04-29 ENCOUNTER — Encounter: Payer: Self-pay | Admitting: Hematology and Oncology

## 2019-04-29 ENCOUNTER — Other Ambulatory Visit: Payer: Self-pay

## 2019-04-29 ENCOUNTER — Inpatient Hospital Stay (HOSPITAL_BASED_OUTPATIENT_CLINIC_OR_DEPARTMENT_OTHER): Payer: Medicare Other | Admitting: Hematology and Oncology

## 2019-04-29 VITALS — BP 143/85 | HR 96 | Temp 97.5°F | Resp 18 | Ht 62.0 in | Wt 158.2 lb

## 2019-04-29 DIAGNOSIS — C9110 Chronic lymphocytic leukemia of B-cell type not having achieved remission: Secondary | ICD-10-CM

## 2019-04-29 DIAGNOSIS — C9190 Lymphoid leukemia, unspecified not having achieved remission: Secondary | ICD-10-CM | POA: Diagnosis not present

## 2019-04-29 LAB — CBC WITH DIFFERENTIAL/PLATELET
Abs Immature Granulocytes: 0.03 10*3/uL (ref 0.00–0.07)
Basophils Absolute: 0.1 10*3/uL (ref 0.0–0.1)
Basophils Relative: 1 %
Eosinophils Absolute: 0.3 10*3/uL (ref 0.0–0.5)
Eosinophils Relative: 2 %
HCT: 44.3 % (ref 36.0–46.0)
Hemoglobin: 15 g/dL (ref 12.0–15.0)
Immature Granulocytes: 0 %
Lymphocytes Relative: 63 %
Lymphs Abs: 8.8 10*3/uL — ABNORMAL HIGH (ref 0.7–4.0)
MCH: 33 pg (ref 26.0–34.0)
MCHC: 33.9 g/dL (ref 30.0–36.0)
MCV: 97.4 fL (ref 80.0–100.0)
Monocytes Absolute: 0.6 10*3/uL (ref 0.1–1.0)
Monocytes Relative: 4 %
Neutro Abs: 4.2 10*3/uL (ref 1.7–7.7)
Neutrophils Relative %: 30 %
Platelets: 287 10*3/uL (ref 150–400)
RBC Morphology: NONE SEEN
RBC: 4.55 MIL/uL (ref 3.87–5.11)
RDW: 12.7 % (ref 11.5–15.5)
WBC: 14.1 10*3/uL — ABNORMAL HIGH (ref 4.0–10.5)
nRBC: 0 % (ref 0.0–0.2)

## 2019-04-29 NOTE — Progress Notes (Signed)
New changes noted today 

## 2019-05-06 ENCOUNTER — Other Ambulatory Visit: Payer: Self-pay

## 2019-05-06 ENCOUNTER — Ambulatory Visit
Admission: RE | Admit: 2019-05-06 | Discharge: 2019-05-06 | Disposition: A | Discharge status: Home / Self Care | Payer: Medicare Other | Source: Ambulatory Visit | Attending: Family | Admitting: Family

## 2019-05-06 DIAGNOSIS — Z1231 Encounter for screening mammogram for malignant neoplasm of breast: Secondary | ICD-10-CM | POA: Diagnosis not present

## 2019-07-29 ENCOUNTER — Encounter: Payer: Self-pay | Admitting: Cardiovascular Disease

## 2019-08-02 ENCOUNTER — Encounter: Payer: Self-pay | Admitting: Cardiovascular Disease

## 2019-08-15 ENCOUNTER — Observation Stay (HOSPITAL_BASED_OUTPATIENT_CLINIC_OR_DEPARTMENT_OTHER)
Admit: 2019-08-15 | Discharge: 2019-08-15 | Disposition: A | Discharge status: Home / Self Care | Payer: Medicare Other | Attending: Internal Medicine | Admitting: Internal Medicine

## 2019-08-15 ENCOUNTER — Emergency Department: Payer: Medicare Other

## 2019-08-15 ENCOUNTER — Encounter: Payer: Self-pay | Admitting: Emergency Medicine

## 2019-08-15 ENCOUNTER — Other Ambulatory Visit: Payer: Self-pay

## 2019-08-15 ENCOUNTER — Inpatient Hospital Stay
Admission: EM | Admit: 2019-08-15 | Discharge: 2019-08-22 | DRG: 246 | Disposition: A | Discharge status: Home / Self Care | Payer: Medicare Other | Attending: Internal Medicine | Admitting: Internal Medicine

## 2019-08-15 DIAGNOSIS — I358 Other nonrheumatic aortic valve disorders: Secondary | ICD-10-CM | POA: Diagnosis present

## 2019-08-15 DIAGNOSIS — I4891 Unspecified atrial fibrillation: Secondary | ICD-10-CM | POA: Diagnosis not present

## 2019-08-15 DIAGNOSIS — Z20822 Contact with and (suspected) exposure to covid-19: Secondary | ICD-10-CM | POA: Diagnosis present

## 2019-08-15 DIAGNOSIS — I25110 Atherosclerotic heart disease of native coronary artery with unstable angina pectoris: Secondary | ICD-10-CM | POA: Diagnosis present

## 2019-08-15 DIAGNOSIS — I480 Paroxysmal atrial fibrillation: Secondary | ICD-10-CM | POA: Diagnosis not present

## 2019-08-15 DIAGNOSIS — Z79899 Other long term (current) drug therapy: Secondary | ICD-10-CM

## 2019-08-15 DIAGNOSIS — Z881 Allergy status to other antibiotic agents status: Secondary | ICD-10-CM

## 2019-08-15 DIAGNOSIS — Y840 Cardiac catheterization as the cause of abnormal reaction of the patient, or of later complication, without mention of misadventure at the time of the procedure: Secondary | ICD-10-CM | POA: Diagnosis not present

## 2019-08-15 DIAGNOSIS — Z7722 Contact with and (suspected) exposure to environmental tobacco smoke (acute) (chronic): Secondary | ICD-10-CM | POA: Diagnosis present

## 2019-08-15 DIAGNOSIS — C9110 Chronic lymphocytic leukemia of B-cell type not having achieved remission: Secondary | ICD-10-CM | POA: Diagnosis not present

## 2019-08-15 DIAGNOSIS — I10 Essential (primary) hypertension: Secondary | ICD-10-CM

## 2019-08-15 DIAGNOSIS — I110 Hypertensive heart disease with heart failure: Secondary | ICD-10-CM | POA: Diagnosis present

## 2019-08-15 DIAGNOSIS — R0789 Other chest pain: Secondary | ICD-10-CM | POA: Diagnosis not present

## 2019-08-15 DIAGNOSIS — Z888 Allergy status to other drugs, medicaments and biological substances status: Secondary | ICD-10-CM

## 2019-08-15 DIAGNOSIS — I959 Hypotension, unspecified: Secondary | ICD-10-CM | POA: Diagnosis not present

## 2019-08-15 DIAGNOSIS — Z882 Allergy status to sulfonamides status: Secondary | ICD-10-CM

## 2019-08-15 DIAGNOSIS — K219 Gastro-esophageal reflux disease without esophagitis: Secondary | ICD-10-CM | POA: Diagnosis present

## 2019-08-15 DIAGNOSIS — M1990 Unspecified osteoarthritis, unspecified site: Secondary | ICD-10-CM | POA: Diagnosis present

## 2019-08-15 DIAGNOSIS — I5031 Acute diastolic (congestive) heart failure: Secondary | ICD-10-CM | POA: Diagnosis present

## 2019-08-15 DIAGNOSIS — R001 Bradycardia, unspecified: Secondary | ICD-10-CM | POA: Diagnosis not present

## 2019-08-15 DIAGNOSIS — I340 Nonrheumatic mitral (valve) insufficiency: Secondary | ICD-10-CM | POA: Diagnosis not present

## 2019-08-15 DIAGNOSIS — I214 Non-ST elevation (NSTEMI) myocardial infarction: Secondary | ICD-10-CM | POA: Diagnosis not present

## 2019-08-15 DIAGNOSIS — R000 Tachycardia, unspecified: Secondary | ICD-10-CM | POA: Diagnosis not present

## 2019-08-15 DIAGNOSIS — T148XXA Other injury of unspecified body region, initial encounter: Secondary | ICD-10-CM | POA: Insufficient documentation

## 2019-08-15 DIAGNOSIS — S5011XA Contusion of right forearm, initial encounter: Secondary | ICD-10-CM | POA: Diagnosis not present

## 2019-08-15 DIAGNOSIS — I219 Acute myocardial infarction, unspecified: Secondary | ICD-10-CM

## 2019-08-15 DIAGNOSIS — I200 Unstable angina: Secondary | ICD-10-CM | POA: Insufficient documentation

## 2019-08-15 DIAGNOSIS — E785 Hyperlipidemia, unspecified: Secondary | ICD-10-CM | POA: Diagnosis present

## 2019-08-15 LAB — TROPONIN I (HIGH SENSITIVITY)
Troponin I (High Sensitivity): 171 ng/L (ref <18)
Troponin I (High Sensitivity): 206 ng/L (ref <18)
Troponin I (High Sensitivity): 341 ng/L (ref <18)
Troponin I (High Sensitivity): 232 ng/L (ref <18)

## 2019-08-15 LAB — ECHOCARDIOGRAM COMPLETE
AR max vel: 1.72 cm2
AV Area VTI: 2.29 cm2
AV Area mean vel: 1.56 cm2
AV Mean grad: 4 mmHg
AV Peak grad: 7.4 mmHg
Ao pk vel: 1.36 m/s
Area-P 1/2: 3.68 cm2
Height: 62 in
S' Lateral: 2.87 cm
Weight: 2529.12 oz

## 2019-08-15 LAB — BASIC METABOLIC PANEL
Anion gap: 10 (ref 5–15)
BUN: 7 mg/dL — ABNORMAL LOW (ref 8–23)
CO2: 25 mmol/L (ref 22–32)
Calcium: 9.7 mg/dL (ref 8.9–10.3)
Chloride: 101 mmol/L (ref 98–111)
Creatinine, Ser: 0.89 mg/dL (ref 0.44–1.00)
GFR calc Af Amer: >60 mL/min (ref >60)
GFR calc non Af Amer: >60 mL/min (ref >60)
Glucose, Bld: 132 mg/dL — ABNORMAL HIGH (ref 70–99)
Potassium: 4.4 mmol/L (ref 3.5–5.1)
Sodium: 136 mmol/L (ref 135–145)

## 2019-08-15 LAB — HEMOGLOBIN A1C
Hgb A1c MFr Bld: 6 % — ABNORMAL HIGH (ref 4.8–5.6)
Mean Plasma Glucose: 125.5 mg/dL

## 2019-08-15 LAB — APTT: aPTT: 30 seconds (ref 24–36)

## 2019-08-15 LAB — CBC
HCT: 45.9 % (ref 36.0–46.0)
Hemoglobin: 15.3 g/dL — ABNORMAL HIGH (ref 12.0–15.0)
MCH: 32.4 pg (ref 26.0–34.0)
MCHC: 33.3 g/dL (ref 30.0–36.0)
MCV: 97.2 fL (ref 80.0–100.0)
Platelets: 339 10*3/uL (ref 150–400)
RBC: 4.72 MIL/uL (ref 3.87–5.11)
RDW: 12.6 % (ref 11.5–15.5)
WBC: 18.4 10*3/uL — ABNORMAL HIGH (ref 4.0–10.5)
nRBC: 0 % (ref 0.0–0.2)

## 2019-08-15 LAB — LIPID PANEL
Cholesterol: 181 mg/dL (ref 0–200)
HDL: 66 mg/dL (ref >40)
LDL Cholesterol: 82 mg/dL (ref 0–99)
Total CHOL/HDL Ratio: 2.7 RATIO
Triglycerides: 164 mg/dL — ABNORMAL HIGH (ref <150)
VLDL: 33 mg/dL (ref 0–40)

## 2019-08-15 LAB — PROTIME-INR
INR: 1 (ref 0.8–1.2)
Prothrombin Time: 12.8 seconds (ref 11.4–15.2)

## 2019-08-15 LAB — HEPARIN LEVEL (UNFRACTIONATED): Heparin Unfractionated: 0.51 IU/mL (ref 0.30–0.70)

## 2019-08-15 LAB — SARS CORONAVIRUS 2 BY RT PCR (HOSPITAL ORDER, PERFORMED IN ~~LOC~~ HOSPITAL LAB): SARS Coronavirus 2: NEGATIVE

## 2019-08-15 MED ORDER — NITROGLYCERIN 0.4 MG SL SUBL: 0.4000 mg | SUBLINGUAL_TABLET | SUBLINGUAL | Status: DC | PRN

## 2019-08-15 MED ORDER — ASPIRIN 81 MG PO CHEW
81.0000 mg | CHEWABLE_TABLET | ORAL | Status: AC
  Administered 2019-08-16: 81 mg via ORAL
  Filled 2019-08-15: qty 1

## 2019-08-15 MED ORDER — SODIUM CHLORIDE 0.9% FLUSH
3.0000 mL | Freq: Two times a day (BID) | INTRAVENOUS | Status: DC
  Administered 2019-08-16 – 2019-08-22 (×6): 3 mL via INTRAVENOUS

## 2019-08-15 MED ORDER — SODIUM CHLORIDE 0.9 % WEIGHT BASED INFUSION
1.0000 mL/kg/h | INTRAVENOUS | Status: DC
  Administered 2019-08-16: 1 mL/kg/h via INTRAVENOUS

## 2019-08-15 MED ORDER — SODIUM CHLORIDE 0.9% FLUSH: 3.0000 mL | INTRAVENOUS | Status: DC | PRN

## 2019-08-15 MED ORDER — CARVEDILOL 6.25 MG PO TABS: 3.1250 mg | ORAL_TABLET | Freq: Two times a day (BID) | ORAL | Status: DC

## 2019-08-15 MED ORDER — ADULT MULTIVITAMIN W/MINERALS CH
1.0000 | ORAL_TABLET | Freq: Every day | ORAL | Status: DC
  Administered 2019-08-15 – 2019-08-22 (×6): 1 via ORAL
  Filled 2019-08-15 (×6): qty 1

## 2019-08-15 MED ORDER — METOPROLOL SUCCINATE ER 50 MG PO TB24: 50.0000 mg | ORAL_TABLET | Freq: Every day | ORAL | Status: DC

## 2019-08-15 MED ORDER — ONDANSETRON HCL 4 MG/2ML IJ SOLN
4.0000 mg | Freq: Four times a day (QID) | INTRAMUSCULAR | Status: DC | PRN
  Administered 2019-08-16: 4 mg via INTRAVENOUS

## 2019-08-15 MED ORDER — ASPIRIN 300 MG RE SUPP: 300.0000 mg | RECTAL | Status: AC

## 2019-08-15 MED ORDER — CALCIUM CARBONATE-VITAMIN D 500-200 MG-UNIT PO TABS
1.0000 | ORAL_TABLET | Freq: Two times a day (BID) | ORAL | Status: DC
  Administered 2019-08-16 – 2019-08-22 (×9): 1 via ORAL
  Filled 2019-08-15 (×12): qty 1

## 2019-08-15 MED ORDER — PANTOPRAZOLE SODIUM 40 MG PO TBEC
40.0000 mg | DELAYED_RELEASE_TABLET | Freq: Every day | ORAL | Status: DC
  Administered 2019-08-17 – 2019-08-22 (×5): 40 mg via ORAL
  Filled 2019-08-15 (×5): qty 1

## 2019-08-15 MED ORDER — ACETAMINOPHEN 325 MG PO TABS
650.0000 mg | ORAL_TABLET | ORAL | Status: DC | PRN
  Administered 2019-08-19 – 2019-08-20 (×3): 650 mg via ORAL
  Filled 2019-08-15 (×3): qty 2

## 2019-08-15 MED ORDER — SODIUM CHLORIDE 0.9 % IV SOLN: 2.0000 g | INTRAVENOUS | Status: DC

## 2019-08-15 MED ORDER — HEPARIN BOLUS VIA INFUSION
3900.0000 [IU] | Freq: Once | INTRAVENOUS | Status: AC
  Administered 2019-08-15: 3900 [IU] via INTRAVENOUS
  Filled 2019-08-15: qty 3900

## 2019-08-15 MED ORDER — SODIUM CHLORIDE 0.9 % WEIGHT BASED INFUSION
3.0000 mL/kg/h | INTRAVENOUS | Status: DC
  Administered 2019-08-16: 3 mL/kg/h via INTRAVENOUS

## 2019-08-15 MED ORDER — SODIUM CHLORIDE 0.9% FLUSH: 3.0000 mL | Freq: Once | INTRAVENOUS | Status: DC

## 2019-08-15 MED ORDER — HEPARIN (PORCINE) 25000 UT/250ML-% IV SOLN
750.0000 [IU]/h | INTRAVENOUS | Status: DC
  Administered 2019-08-15: 750 [IU]/h via INTRAVENOUS
  Filled 2019-08-15: qty 250

## 2019-08-15 MED ORDER — SIMVASTATIN 20 MG PO TABS
20.0000 mg | ORAL_TABLET | Freq: Every day | ORAL | Status: DC
  Administered 2019-08-15: 20 mg via ORAL
  Filled 2019-08-15: qty 1

## 2019-08-15 MED ORDER — ASPIRIN 81 MG PO CHEW: 324.0000 mg | CHEWABLE_TABLET | ORAL | Status: AC

## 2019-08-15 MED ORDER — SODIUM CHLORIDE 0.9 % IV SOLN: 250.0000 mL | INTRAVENOUS | Status: DC | PRN

## 2019-08-15 MED ORDER — DICLOFENAC SODIUM 1 % TD GEL
2.0000 g | Freq: Four times a day (QID) | TRANSDERMAL | Status: DC
  Administered 2019-08-15 (×2): 2 g via TOPICAL
  Filled 2019-08-15 (×2): qty 100

## 2019-08-15 MED ORDER — ASPIRIN EC 81 MG PO TBEC
81.0000 mg | DELAYED_RELEASE_TABLET | Freq: Every day | ORAL | Status: DC
  Administered 2019-08-17 – 2019-08-22 (×5): 81 mg via ORAL
  Filled 2019-08-15 (×5): qty 1

## 2019-08-15 MED ORDER — VITAMIN D3 25 MCG (1000 UNIT) PO TABS
3000.0000 [IU] | ORAL_TABLET | Freq: Every day | ORAL | Status: DC
  Administered 2019-08-15 – 2019-08-22 (×6): 3000 [IU] via ORAL
  Filled 2019-08-15 (×13): qty 3

## 2019-08-15 NOTE — Consult Note (Signed)
Cardiology Consultation:   Patient ID: Cheryl Briggs MRN: [id number]; DOB: [birth year]  Admit date: 08/15/2019 Date of Consult: 08/15/2019  Primary Care Provider: Joyice Faster, FNP Primary Cardiologist:[facility], Dr. Fletcher Anon Primary Electrophysiologist:  None    Patient Profile:   Cheryl Briggs is a 74 y.o. female with a hx of CLL, secondhand smoke exposure, and no known cardiac dz, and who is being seen today for the evaluation of NSTEMI at the request of Dr. Francine Graven.  History of Present Illness:   Cheryl Briggs is a 74 yo female with no previously known history of heart disease.    She does report exposure to secondhand smoke for approximately 20 years via her husband.  She reports no previously known history of hypertension.  She does not drink alcohol and denies any personal use of tobacco now or in the past.  No history of drug use.  She reports intermittent LEE but not recently. Usually, she walks in the afternoons to try and stay active.  For the last 2 weeks, she reports avoiding her normal afternoon walks due to chest discomfort.  This exertional chest discomfort then progressed to discomfort at rest.    This morning, 08/15/2019, and at approximately 7:45 AM, she was sitting in her chair and drinking her decaf coffee when she felt 10/10 chest pain and pressure in her center of her chest.  She reports associated shortness of breath, diaphoresis, nausea, emesis, and dizziness.  She also felt weakness and fatigue. She is uncertain if she had racing heart rate or palpitations.  She reports that the nausea resulted in emesis x1.  After this time, she backed up against her wall to support her. She is not certain what this emesis looked like at the time, as the room was dark.  She reports that her symptoms continued until shortly before EMS arrival.  She is uncertain what caused her chest pain to improve.  She reports that this episode was similar to those felt over the last 2 weeks;  however, this was much severe in intensity and concerning to her.  In the [facility] ED, labs significant for sodium 136, potassium 4.4, creatinine 0.89, BUN 7, hemoglobin 15.3, hematocrit 45.9. HS Tn 171  206 and still cycling. EKG NSR, 71 bpm, T wave inversion in inferior and anterior leads, low voltage QRS, poor R wave progression in precordial leads, QTC 452. CXR without active dz.    Heart Pathway Score:     Past Medical History:  Diagnosis Date   Allergy    Arthritis    Cancer ([place])    CLL    Past Surgical History:  Procedure Laterality Date   BACK SURGERY     BLADDER REPAIR     carpel tunnell Bilateral    CHOLECYSTECTOMY     COLONOSCOPY  2015   SHOULDER SURGERY Bilateral      Home Medications:  Prior to Admission medications   Medication Sig Start Date End Date Taking? Authorizing Provider  Bioflavonoid Products (ESTER C PO) Take 1,000 mg by mouth daily.   Yes [provider]  calcium citrate-vitamin D (CITRACAL+D) 315-200 MG-UNIT tablet Take 1 tablet by mouth 2 (two) times daily.   Yes [provider]  Chlorpheniramine Maleate (CHLOR-TABLETS PO) Take 1 tablet by mouth as needed.    Yes [provider]  Cholecalciferol (VITAMIN D-1000 MAX ST) 25 MCG (1000 UT) tablet Take 3,000 Units by mouth daily.   Yes [provider]  desonide (DESOWEN) 0.05 % ointment  desonide 0.05 % topical ointment 02/21/17  Yes [provider]  diclofenac (VOLTAREN) 75 MG EC tablet Take 75 mg by mouth 2 (two) times daily.  02/20/17  Yes [provider]  diclofenac sodium (VOLTAREN) 1 % GEL Apply topically 4 (four) times daily.   Yes [provider]  metoprolol succinate (TOPROL-XL) 50 MG 24 hr tablet Take 50 mg by mouth daily. 01/19/17  Yes [provider]  Multiple Vitamins-Minerals (MULTIVITAMIN WITH MINERALS) tablet Take 1 tablet by mouth daily.   Yes [provider]  Omeprazole (PRILOSEC PO) Take 1 tablet by mouth  daily.    Yes [provider]  simvastatin (ZOCOR) 40 MG tablet Take 20 mg by mouth daily.    Yes [provider]    Inpatient Medications: Scheduled Meds:  aspirin  324 mg Oral NOW   Or   aspirin  300 mg Rectal NOW   [START ON 08/16/2019] aspirin EC  81 mg Oral Daily   calcium-vitamin D  1 tablet Oral BID WC   cholecalciferol  3,000 Units Oral Daily   diclofenac sodium  2 g Topical QID   [START ON 08/16/2019] metoprolol succinate  50 mg Oral Daily   multivitamin with minerals  1 tablet Oral Daily   [START ON 08/16/2019] pantoprazole  40 mg Oral Daily   simvastatin  20 mg Oral q1800   sodium chloride flush  3 mL Intravenous Once   Continuous Infusions:  heparin 750 Units/hr (08/15/19 1322)   PRN Meds: acetaminophen, nitroGLYCERIN, ondansetron (ZOFRAN) IV  Allergies:    Allergies  Allergen Reactions   Corticosteroids     Other reaction(s): Headache   Other Other (See Comments)   Sulfa Antibiotics Anaphylaxis   Clarithromycin     Other reaction(s): Other (See Comments) Rectal bleeding    Cymbalta [Duloxetine Hcl] Diarrhea   Prednisone Other (See Comments)    Color changes in face, headache and difficulty walking    Social History:   Social History   Socioeconomic History   Marital status: Widowed    Spouse name: Not on file   Number of children: Not on file   Years of education: Not on file   Highest education level: Not on file  Occupational History   Not on file  Tobacco Use   Smoking status: Never Smoker   Smokeless tobacco: Never Used  Substance and Sexual Activity   Alcohol use: No   Drug use: No   Sexual activity: Not on file  Other Topics Concern   Not on file  Social History Narrative   Not on file   Social Determinants of Health   Financial Resource Strain:    Difficulty of Paying Living Expenses:   Food Insecurity:    Worried About [place] in the Last Year:    Arboriculturist in the  Last Year:   Transportation Needs:    Film/video editor (Medical):    Lack of Transportation (Non-Medical):   Physical Activity:    Days of Exercise per Week:    Minutes of Exercise per Session:   Stress:    Feeling of Stress :   Social Connections:    Frequency of Communication with Friends and Family:    Frequency of Social Gatherings with Friends and Family:    Attends Religious Services:    Active Member of Clubs or Organizations:    Attends Archivist Meetings:    Marital Status:   Intimate Partner Violence:  Fear of Current or Ex-Partner:    Emotionally Abused:    Physically Abused:    Sexually Abused:     Family History:   No known family history of heart disease. Family History  Problem Relation Age of Onset   Breast cancer Neg Hx      ROS:  Please see the history of present illness.  Review of Systems  Constitutional: Positive for diaphoresis and malaise/fatigue.  Respiratory: Positive for shortness of breath.   Cardiovascular: Positive for chest pain. Negative for palpitations and leg swelling.       Occasional lower extremity edema reported but not recently  Gastrointestinal: Positive for nausea and vomiting.  Musculoskeletal: Negative for falls.  Neurological: Positive for dizziness and weakness.    All other ROS reviewed and negative.     Physical Exam/Data:   Vitals:   08/15/19 1022  BP: 137/60  Pulse: 75  Resp: 16  Temp: 97.8 F (36.6 C)  TempSrc: Oral  SpO2: 96%  Weight: 71.7 kg  Height: 5\' 2"  (1.575 m)   No intake or output data in the 24 hours ending 08/15/19 1331 Last 3 Weights 08/15/2019 04/29/2019 02/28/2018  Weight (lbs) 158 lb 1.1 oz 158 lb 2.9 oz 162 lb 0.6 oz  Weight (kg) 71.7 kg 71.75 kg 73.5 kg     Body mass index is 28.91 kg/m.  General:  Well nourished, well developed, in no acute distress HEENT: normal Neck: no JVD Vascular: No carotid bruits; radial pulses 2+ bilaterally Cardiac:  normal S1,  S2; RRR; no murmur  Lungs:  clear to auscultation bilaterally, no wheezing, rhonchi or rales  Abd: soft, nontender, no hepatomegaly  Ext: no edema Musculoskeletal:  No deformities, BUE and BLE strength normal and equal Skin: warm and dry  Neuro:  No focal abnormalities noted Psych:  Normal affect   EKG:  The EKG was personally reviewed and demonstrates: EKG NSR, 71 bpm, T wave inversion in inferior and anterior leads, low voltage QRS, poor R wave progression in precordial leads, QTC 452. Telemetry:  Telemetry was personally reviewed and demonstrates:  NSR  Relevant CV Studies: Echo pending  Laboratory Data:  High Sensitivity Troponin:   Recent Labs  Lab 08/15/19 1024 08/15/19 1241  TROPONINIHS 171* 206*     Cardiac EnzymesNo results for input(s): TROPONINI in the last 168 hours. No results for input(s): TROPIPOC in the last 168 hours.  Chemistry Recent Labs  Lab 08/15/19 1024  NA 136  K 4.4  CL 101  CO2 25  GLUCOSE 132*  BUN 7*  CREATININE 0.89  CALCIUM 9.7  GFRNONAA >60  GFRAA >60  ANIONGAP 10    No results for input(s): PROT, ALBUMIN, AST, ALT, ALKPHOS, BILITOT in the last 168 hours. Hematology Recent Labs  Lab 08/15/19 1024  WBC 18.4*  RBC 4.72  HGB 15.3*  HCT 45.9  MCV 97.2  MCH 32.4  MCHC 33.3  RDW 12.6  PLT 339   BNPNo results for input(s): BNP, PROBNP in the last 168 hours.  DDimer No results for input(s): DDIMER in the last 168 hours.   Radiology/Studies:  DG Chest 2 View  Result Date: 08/15/2019 CLINICAL DATA:  Chest pain EXAM: CHEST - 2 VIEW COMPARISON:  10/06/2017 FINDINGS: Heart is normal size. Linear scarring at the left base. Right lung clear. No effusions or acute bony abnormality. Degenerative changes in the shoulders and thoracic spine. Leftward scoliosis in the thoracolumbar spine. IMPRESSION: Chronic changes.  No active disease. Electronically Signed  By: Rolm Baptise M.D.   On: 08/15/2019 11:12    Assessment and Plan:    NSTEMI --No current chest pain. Reports typical CP that started with exertion and now at rest. As outlined in HPI, sx are typical and concerning for cardiac ischemia. Risk factors include exposure to second hand smoke x20 years.  --High-sensitivity troponin 171  206.  --Continue to cycle Tn until peaked, down-trending. --EKG with TWI but without acute ST/T changes.  --Continue to monitor telemetry. --Echo results pending. --Continue IV heparin, ASA 81mg  daily.  --Continue PTA BB and statin. --Will check LDL, LFTs. Will check A1C.  --Daily BMET and CBC. Current renal function and Hgb stable.  --Continue PRN SL nitro.  --Scheduled for [facility] tomorrow 08/16/19. NPO after midnight.  --Risks and benefits of cardiac catheterization have been discussed with the patient.  These include bleeding, infection, kidney damage, stroke, heart attack, death.  The patient understands these risks and is willing to proceed.  Hypertension --Restarted on PTA BB with Toprol 50mg  daily. --Consider transition to shorter acting BB this admission with either metoprolol tartrate or carvedilol and to allow for easier titration during admission. Coreg could also offer more BP support over that of HR. If additional BP support needed, or if echo shows reduced EF or labs show comorbid DM2, consider also addition of Losartan.  GERD --Continue PPI.   For questions or updates, please contact [place] Please consult [email or web address] for contact info under     Signed, Arvil Chaco, PA-C  08/15/2019 1:31 PM

## 2019-08-15 NOTE — ED Triage Notes (Signed)
Pt comes into the ED via EMS from home with c/o CP that started around 745 with near syncope, CP has subsided but having generalized weakness. VSS.. ASA 324mg  taken at home PTA. No IV access. CBG 167, 97.8, a/ox4

## 2019-08-15 NOTE — Consult Note (Signed)
[facility] for Heparin Infusion Indication: chest pain/ACS  Allergies  Allergen Reactions   Corticosteroids     Other reaction(s): Headache   Other Other (See Comments)   Sulfa Antibiotics Anaphylaxis   Clarithromycin     Other reaction(s): Other (See Comments) Rectal bleeding    Cymbalta [Duloxetine Hcl] Diarrhea   Prednisone Other (See Comments)    Color changes in face, headache and difficulty walking    Patient Measurements: Height: 5\' 2"  (157.5 cm) Weight: 69.4 kg (152 lb 14.4 oz) IBW/kg (Calculated) : 50.1 Heparin Dosing Weight: 65.3 kg  Vital Signs: Temp: 98 F (36.7 C) (08/05 1954) Temp Source: Oral (08/05 1954) BP: 133/69 (08/05 1954) Pulse Rate: 80 (08/05 1954)  Labs: Recent Labs    08/15/19 1024 08/15/19 1241 08/15/19 1311 08/15/19 1656 08/15/19 2014  HGB 15.3*  --   --   --   --   HCT 45.9  --   --   --   --   PLT 339  --   --   --   --   APTT  --   --  30  --   --   LABPROT  --   --  12.8  --   --   INR  --   --  1.0  --   --   HEPARINUNFRC  --   --   --   --  0.51  CREATININE 0.89  --   --   --   --   TROPONINIHS 171* 206*  --  341*  --     Estimated Creatinine Clearance: 49.8 mL/min (by C-G formula based on SCr of 0.89 mg/dL).   Medications:  No anticoagulation prior to admission per chart review  Assessment: Patient is a 74 y/o F with medical history significant for CLL who presented to the ED with chief complaint of chest pain. Troponin 171. Pharmacy has been consulted to start heparin infusion for ACS.  Baseline H&H, platelets within normal limits. Baseline aPTT and PT-INR within normal limits.   Goal of Therapy:  Heparin level 0.3-0.7 units/ml Monitor platelets by anticoagulation protocol: Yes   Plan:  --8/5 at 2014 HL = 0.51, therapeutic x 1. Continue heparin at current rate of 750 units/hr --Re-check confirmatory heparin level tomorrow AM --Daily CBC per protocol  Benita Gutter 08/15/2019,8:47 PM

## 2019-08-15 NOTE — Consult Note (Signed)
[place] for Heparin Infusion Indication: chest pain/ACS  Allergies  Allergen Reactions  . Corticosteroids     Other reaction(s): Headache  . Other Other (See Comments)  . Sulfa Antibiotics Anaphylaxis  . Clarithromycin     Other reaction(s): Other (See Comments) Rectal bleeding   . Cymbalta [Duloxetine Hcl] Diarrhea  . Prednisone Other (See Comments)    Color changes in face, headache and difficulty walking    Patient Measurements: Height: 5\' 2"  (157.5 cm) Weight: 71.7 kg (158 lb 1.1 oz) IBW/kg (Calculated) : 50.1 Heparin Dosing Weight: 65.3 kg  Vital Signs: Temp: 97.8 F (36.6 C) (08/05 1022) Temp Source: Oral (08/05 1022) BP: 137/60 (08/05 1022) Pulse Rate: 75 (08/05 1022)  Labs: Recent Labs    08/15/19 1024  HGB 15.3*  HCT 45.9  PLT 339  CREATININE 0.89  TROPONINIHS 171*    Estimated Creatinine Clearance: 50.6 mL/min (by C-G formula based on SCr of 0.89 mg/dL).   Medications:  No anticoagulation prior to admission per chart review  Assessment: Patient is a 74 y/o F with medical history significant for CLL who presented to the ED with chief complaint of chest pain. Troponin 171. Pharmacy has been consulted to start heparin infusion for ACS.  Baseline H&H, platelets within normal limits. Baseline aPTT and PT-INR pending.   Goal of Therapy:  Heparin level 0.3-0.7 units/ml Monitor platelets by anticoagulation protocol: Yes   Plan:  --Heparin 3900 unit IV bolus x 1 followed by continuous infusion at 750 units/hr --Heparin level 6 hours after initiation of infusion --Daily CBC per protocol  Benita Gutter 08/15/2019,12:53 PM

## 2019-08-15 NOTE — H&P (Signed)
History and Physical    Cheryl Briggs TGY:[id number] DOB: June 21, 1945 DOA: 08/15/2019  PCP: Joyice Faster, FNP   Patient coming from: Home  I have personally briefly reviewed patient's old medical records in [place]  Chief Complaint: Chest pain  HPI: Cheryl Briggs is a 74 y.o. female with medical history significant for CLL who presents to the emergency room for evaluation of chest pain which she described as midsternal chest pressure, feeling like something was sitting on her chest.  She rated her pain an 8 x 10 intensity at its worst.  There was no radiation of the pain but was associated with diaphoresis, nausea, vomiting and shortness of breath.   She denies having any fever, chills, abdominal pain, no changes in her bowel habits, no urinary symptoms. Labs reveal a white count of 18.4, hemoglobin of 15.3 hematocrit of 45.9 and troponin of 171. Chest x-ray reviewed by me shows no acute findings. Twelve-lead ECG reviewed by me shows normal sinus rhythm with low voltage QRS.  Cheryl Course: Patient is a 74 year old female who presents to the ER for evaluation of chest pressure mostly midsternal nonradiating associated with shortness of breath and diaphoresis.  Twelve-lead EKG does not show acute ST-T wave changes.  Patient has an elevated troponin level.  She will be referred to observation status  Review of Systems: As per HPI otherwise 10 point review of systems negative.    Past Medical History:  Diagnosis Date  . Allergy   . Arthritis   . Cancer ([place])    CLL    Past Surgical History:  Procedure Laterality Date  . BACK SURGERY    . BLADDER REPAIR    . carpel tunnell Bilateral   . CHOLECYSTECTOMY    . COLONOSCOPY  2015  . SHOULDER SURGERY Bilateral      reports that she has never smoked. She has never used smokeless tobacco. She reports that she does not drink alcohol and does not use drugs.  Allergies  Allergen Reactions  . Corticosteroids     Other  reaction(s): Headache  . Other Other (See Comments)  . Sulfa Antibiotics Anaphylaxis  . Clarithromycin     Other reaction(s): Other (See Comments) Rectal bleeding   . Cymbalta [Duloxetine Hcl] Diarrhea  . Prednisone Other (See Comments)    Color changes in face, headache and difficulty walking    Family History  Problem Relation Age of Onset  . Breast cancer Neg Hx      Prior to Admission medications   Medication Sig Start Date End Date Taking? Authorizing Provider  Bioflavonoid Products (ESTER C PO) Take 1,000 mg by mouth daily.   Yes [provider]  calcium citrate-vitamin D (CITRACAL+D) 315-200 MG-UNIT tablet Take 1 tablet by mouth 2 (two) times daily.   Yes [provider]  Chlorpheniramine Maleate (CHLOR-TABLETS PO) Take 1 tablet by mouth as needed.    Yes [provider]  Cholecalciferol (VITAMIN D-1000 MAX ST) 25 MCG (1000 UT) tablet Take 3,000 Units by mouth daily.   Yes [provider]  desonide (DESOWEN) 0.05 % ointment desonide 0.05 % topical ointment 02/21/17  Yes [provider]  diclofenac (VOLTAREN) 75 MG EC tablet Take 75 mg by mouth 2 (two) times daily.  02/20/17  Yes [provider]  diclofenac sodium (VOLTAREN) 1 % GEL Apply topically 4 (four) times daily.   Yes [provider]  metoprolol succinate (TOPROL-XL) 50 MG 24 hr tablet Take 50 mg by mouth daily. 01/19/17  Yes [provider]  Multiple Vitamins-Minerals (MULTIVITAMIN WITH MINERALS) tablet Take 1 tablet by mouth daily.   Yes [provider]  Omeprazole (PRILOSEC PO) Take 1 tablet by mouth daily.    Yes [provider]  simvastatin (ZOCOR) 40 MG tablet Take 20 mg by mouth daily.    Yes [provider]    Physical Exam: Vitals:   08/15/19 1022  BP: 137/60  Pulse: 75  Resp: 16  Temp: 97.8 F (36.6 C)  TempSrc: Oral  SpO2: 96%  Weight: 71.7 kg  Height: 5\' 2"  (1.575 m)     Vitals:   08/15/19 1022    BP: 137/60  Pulse: 75  Resp: 16  Temp: 97.8 F (36.6 C)  TempSrc: Oral  SpO2: 96%  Weight: 71.7 kg  Height: 5\' 2"  (1.575 m)    Constitutional: NAD, alert and oriented x 3 Eyes: PERRL, lids and conjunctivae normal ENMT: Mucous membranes are moist.  Neck: normal, supple, no masses, no thyromegaly Respiratory: clear to auscultation bilaterally, no wheezing, no crackles. Normal respiratory effort. No accessory muscle use.  Cardiovascular: Regular rate and rhythm, no murmurs / rubs / gallops. No extremity edema. 2+ pedal pulses. No carotid bruits.  Abdomen: no tenderness, no masses palpated. No hepatosplenomegaly. Bowel sounds positive.  Musculoskeletal: no clubbing / cyanosis. No joint deformity upper and lower extremities.  Skin: no rashes, lesions, ulcers.  Neurologic: No gross focal neurologic deficit. Psychiatric: Normal mood and affect.   Labs on Admission: I have personally reviewed following labs and imaging studies  CBC: Recent Labs  Lab 08/15/19 1024  WBC 18.4*  HGB 15.3*  HCT 45.9  MCV 97.2  PLT 333   Basic Metabolic Panel: Recent Labs  Lab 08/15/19 1024  NA 136  K 4.4  CL 101  CO2 25  GLUCOSE 132*  BUN 7*  CREATININE 0.89  CALCIUM 9.7   GFR: Estimated Creatinine Clearance: 50.6 mL/min (by C-G formula based on SCr of 0.89 mg/dL). Liver Function Tests: No results for input(s): AST, ALT, ALKPHOS, BILITOT, PROT, ALBUMIN in the last 168 hours. No results for input(s): LIPASE, AMYLASE in the last 168 hours. No results for input(s): AMMONIA in the last 168 hours. Coagulation Profile: No results for input(s): INR, PROTIME in the last 168 hours. Cardiac Enzymes: No results for input(s): CKTOTAL, CKMB, CKMBINDEX, TROPONINI in the last 168 hours. BNP (last 3 results) No results for input(s): PROBNP in the last 8760 hours. HbA1C: No results for input(s): HGBA1C in the last 72 hours. CBG: No results for input(s): GLUCAP in the last 168 hours. Lipid  Profile: No results for input(s): CHOL, HDL, LDLCALC, TRIG, CHOLHDL, LDLDIRECT in the last 72 hours. Thyroid Function Tests: No results for input(s): TSH, T4TOTAL, FREET4, T3FREE, THYROIDAB in the last 72 hours. Anemia Panel: No results for input(s): VITAMINB12, FOLATE, FERRITIN, TIBC, IRON, RETICCTPCT in the last 72 hours. Urine analysis: No results found for: COLORURINE, APPEARANCEUR, [place], [place], GLUCOSEU, [place], BILIRUBINUR, KETONESUR, PROTEINUR, [place], NITRITE, LEUKOCYTESUR  Radiological Exams on Admission: DG Chest 2 View  Result Date: 08/15/2019 CLINICAL DATA:  Chest pain EXAM: CHEST - 2 VIEW COMPARISON:  10/06/2017 FINDINGS: Heart is normal size. Linear scarring at the left base. Right lung clear. No effusions or acute bony abnormality. Degenerative changes in the shoulders and thoracic spine. Leftward scoliosis in the thoracolumbar spine. IMPRESSION: Chronic changes.  No active disease. Electronically Signed   By: Rolm Baptise M.D.   On: 08/15/2019 11:12    EKG: Independently reviewed.  Sinus rhythm Low voltage QRS  Assessment/Plan Principal Problem:   NSTEMI (non-ST elevated myocardial infarction) (HCC) Active Problems:   CLL (chronic lymphocytic leukemia) ([place])    NSTEMI Patient presents to the ER for evaluation of chest pressure which was mostly midsternal and associated with shortness of breath and diaphoresis. Initial troponin is 171 Obtain serial troponin levels Start patient on a heparin drip Continue aspirin, beta-blockers and statins We will request cardiology consult   History of CLL Stable   DVT prophylaxis: Heparin Code Status: Full code Family Communication: Greater than 50% of time was spent discussing plan of care with patient at the bedside.  All questions and concerns have been addressed.  She verbalizes understanding and agrees with the plan. Disposition Plan: Back to previous home environment Consults called:  Cardiology    Nerea Bordenave MD [facility]     08/15/2019, 1:03 PM

## 2019-08-15 NOTE — ED Notes (Signed)
ECHO at bedside.

## 2019-08-15 NOTE — ED Triage Notes (Signed)
C/O chest pain this morning x 30 minutes.  STates pain has resolved.  AAOx3.  Skin warm and dry. NAD

## 2019-08-15 NOTE — H&P (View-Only) (Signed)
Cardiology Consultation:   Patient ID: Cheryl Briggs MRN: [id number]; DOB: Apr 07, 1945  Admit date: 08/15/2019 Date of Consult: 08/15/2019  Primary Care Provider: Joyice Faster, FNP Primary Cardiologist:[facility], Dr. Fletcher Anon Primary Electrophysiologist:  None    Patient Profile:   Cheryl Briggs is a 74 y.o. female with a hx of CLL, secondhand smoke exposure, and no known cardiac dz, and who is being seen today for the evaluation of NSTEMI at the request of Dr. Francine Graven.  History of Present Illness:   Cheryl Briggs is a 73 yo female with no previously known history of heart disease.    She does report exposure to secondhand smoke for approximately 20 years via her husband.  She reports no previously known history of hypertension.  She does not drink alcohol and denies any personal use of tobacco now or in the past.  No history of drug use.  She reports intermittent LEE but not recently. Usually, she walks in the afternoons to try and stay active.  For the last 2 weeks, she reports avoiding her normal afternoon walks due to chest discomfort.  This exertional chest discomfort then progressed to discomfort at rest.    This morning, 08/15/2019, and at approximately 7:45 AM, she was sitting in her chair and drinking her decaf coffee when she felt 10/10 chest pain and pressure in her center of her chest.  She reports associated shortness of breath, diaphoresis, nausea, emesis, and dizziness.  She also felt weakness and fatigue. She is uncertain if she had racing heart rate or palpitations.  She reports that the nausea resulted in emesis x1.  After this time, she backed up against her wall to support her. She is not certain what this emesis looked like at the time, as the room was dark.  She reports that her symptoms continued until shortly before EMS arrival.  She is uncertain what caused her chest pain to improve.  She reports that this episode was similar to those felt over the last 2 weeks;  however, this was much severe in intensity and concerning to her.  In the [facility] ED, labs significant for sodium 136, potassium 4.4, creatinine 0.89, BUN 7, hemoglobin 15.3, hematocrit 45.9. HS Tn 171  206 and still cycling. EKG NSR, 71 bpm, T wave inversion in inferior and anterior leads, low voltage QRS, poor R wave progression in precordial leads, QTC 452. CXR without active dz.    Heart Pathway Score:     Past Medical History:  Diagnosis Date   Allergy    Arthritis    Cancer ([place])    CLL    Past Surgical History:  Procedure Laterality Date   BACK SURGERY     BLADDER REPAIR     carpel tunnell Bilateral    CHOLECYSTECTOMY     COLONOSCOPY  2015   SHOULDER SURGERY Bilateral      Home Medications:  Prior to Admission medications   Medication Sig Start Date End Date Taking? Authorizing Provider  Bioflavonoid Products (ESTER C PO) Take 1,000 mg by mouth daily.   Yes [provider]  calcium citrate-vitamin D (CITRACAL+D) 315-200 MG-UNIT tablet Take 1 tablet by mouth 2 (two) times daily.   Yes [provider]  Chlorpheniramine Maleate (CHLOR-TABLETS PO) Take 1 tablet by mouth as needed.    Yes [provider]  Cholecalciferol (VITAMIN D-1000 MAX ST) 25 MCG (1000 UT) tablet Take 3,000 Units by mouth daily.   Yes [provider]  desonide (DESOWEN) 0.05 % ointment  desonide 0.05 % topical ointment 02/21/17  Yes [provider]  diclofenac (VOLTAREN) 75 MG EC tablet Take 75 mg by mouth 2 (two) times daily.  02/20/17  Yes [provider]  diclofenac sodium (VOLTAREN) 1 % GEL Apply topically 4 (four) times daily.   Yes [provider]  metoprolol succinate (TOPROL-XL) 50 MG 24 hr tablet Take 50 mg by mouth daily. 01/19/17  Yes [provider]  Multiple Vitamins-Minerals (MULTIVITAMIN WITH MINERALS) tablet Take 1 tablet by mouth daily.   Yes [provider]  Omeprazole (PRILOSEC PO) Take 1 tablet by mouth  daily.    Yes [provider]  simvastatin (ZOCOR) 40 MG tablet Take 20 mg by mouth daily.    Yes [provider]    Inpatient Medications: Scheduled Meds:  aspirin  324 mg Oral NOW   Or   aspirin  300 mg Rectal NOW   [START ON 08/16/2019] aspirin EC  81 mg Oral Daily   calcium-vitamin D  1 tablet Oral BID WC   cholecalciferol  3,000 Units Oral Daily   diclofenac sodium  2 g Topical QID   [START ON 08/16/2019] metoprolol succinate  50 mg Oral Daily   multivitamin with minerals  1 tablet Oral Daily   [START ON 08/16/2019] pantoprazole  40 mg Oral Daily   simvastatin  20 mg Oral q1800   sodium chloride flush  3 mL Intravenous Once   Continuous Infusions:  heparin 750 Units/hr (08/15/19 1322)   PRN Meds: acetaminophen, nitroGLYCERIN, ondansetron (ZOFRAN) IV  Allergies:    Allergies  Allergen Reactions   Corticosteroids     Other reaction(s): Headache   Other Other (See Comments)   Sulfa Antibiotics Anaphylaxis   Clarithromycin     Other reaction(s): Other (See Comments) Rectal bleeding    Cymbalta [Duloxetine Hcl] Diarrhea   Prednisone Other (See Comments)    Color changes in face, headache and difficulty walking    Social History:   Social History   Socioeconomic History   Marital status: Widowed    Spouse name: Not on file   Number of children: Not on file   Years of education: Not on file   Highest education level: Not on file  Occupational History   Not on file  Tobacco Use   Smoking status: Never Smoker   Smokeless tobacco: Never Used  Substance and Sexual Activity   Alcohol use: No   Drug use: No   Sexual activity: Not on file  Other Topics Concern   Not on file  Social History Narrative   Not on file   Social Determinants of Health   Financial Resource Strain:    Difficulty of Paying Living Expenses:   Food Insecurity:    Worried About [place] in the Last Year:    Arboriculturist in the  Last Year:   Transportation Needs:    Film/video editor (Medical):    Lack of Transportation (Non-Medical):   Physical Activity:    Days of Exercise per Week:    Minutes of Exercise per Session:   Stress:    Feeling of Stress :   Social Connections:    Frequency of Communication with Friends and Family:    Frequency of Social Gatherings with Friends and Family:    Attends Religious Services:    Active Member of Clubs or Organizations:    Attends Archivist Meetings:    Marital Status:   Intimate Partner Violence:  Fear of Current or Ex-Partner:    Emotionally Abused:    Physically Abused:    Sexually Abused:     Family History:   No known family history of heart disease. Family History  Problem Relation Age of Onset   Breast cancer Neg Hx      ROS:  Please see the history of present illness.  Review of Systems  Constitutional: Positive for diaphoresis and malaise/fatigue.  Respiratory: Positive for shortness of breath.   Cardiovascular: Positive for chest pain. Negative for palpitations and leg swelling.       Occasional lower extremity edema reported but not recently  Gastrointestinal: Positive for nausea and vomiting.  Musculoskeletal: Negative for falls.  Neurological: Positive for dizziness and weakness.    All other ROS reviewed and negative.     Physical Exam/Data:   Vitals:   08/15/19 1022  BP: 137/60  Pulse: 75  Resp: 16  Temp: 97.8 F (36.6 C)  TempSrc: Oral  SpO2: 96%  Weight: 71.7 kg  Height: 5\' 2"  (1.575 m)   No intake or output data in the 24 hours ending 08/15/19 1331 Last 3 Weights 08/15/2019 04/29/2019 02/28/2018  Weight (lbs) 158 lb 1.1 oz 158 lb 2.9 oz 162 lb 0.6 oz  Weight (kg) 71.7 kg 71.75 kg 73.5 kg     Body mass index is 28.91 kg/m.  General:  Well nourished, well developed, in no acute distress HEENT: normal Neck: no JVD Vascular: No carotid bruits; radial pulses 2+ bilaterally Cardiac:  normal S1,  S2; RRR; no murmur  Lungs:  clear to auscultation bilaterally, no wheezing, rhonchi or rales  Abd: soft, nontender, no hepatomegaly  Ext: no edema Musculoskeletal:  No deformities, BUE and BLE strength normal and equal Skin: warm and dry  Neuro:  No focal abnormalities noted Psych:  Normal affect   EKG:  The EKG was personally reviewed and demonstrates: EKG NSR, 71 bpm, T wave inversion in inferior and anterior leads, low voltage QRS, poor R wave progression in precordial leads, QTC 452. Telemetry:  Telemetry was personally reviewed and demonstrates:  NSR  Relevant CV Studies: Echo pending  Laboratory Data:  High Sensitivity Troponin:   Recent Labs  Lab 08/15/19 1024 08/15/19 1241  TROPONINIHS 171* 206*     Cardiac EnzymesNo results for input(s): TROPONINI in the last 168 hours. No results for input(s): TROPIPOC in the last 168 hours.  Chemistry Recent Labs  Lab 08/15/19 1024  NA 136  K 4.4  CL 101  CO2 25  GLUCOSE 132*  BUN 7*  CREATININE 0.89  CALCIUM 9.7  GFRNONAA >60  GFRAA >60  ANIONGAP 10    No results for input(s): PROT, ALBUMIN, AST, ALT, ALKPHOS, BILITOT in the last 168 hours. Hematology Recent Labs  Lab 08/15/19 1024  WBC 18.4*  RBC 4.72  HGB 15.3*  HCT 45.9  MCV 97.2  MCH 32.4  MCHC 33.3  RDW 12.6  PLT 339   BNPNo results for input(s): BNP, PROBNP in the last 168 hours.  DDimer No results for input(s): DDIMER in the last 168 hours.   Radiology/Studies:  DG Chest 2 View  Result Date: 08/15/2019 CLINICAL DATA:  Chest pain EXAM: CHEST - 2 VIEW COMPARISON:  10/06/2017 FINDINGS: Heart is normal size. Linear scarring at the left base. Right lung clear. No effusions or acute bony abnormality. Degenerative changes in the shoulders and thoracic spine. Leftward scoliosis in the thoracolumbar spine. IMPRESSION: Chronic changes.  No active disease. Electronically Signed  By: Rolm Baptise M.D.   On: 08/15/2019 11:12    Assessment and Plan:    NSTEMI --No current chest pain. Reports typical CP that started with exertion and now at rest. As outlined in HPI, sx are typical and concerning for cardiac ischemia. Risk factors include exposure to second hand smoke x20 years.  --High-sensitivity troponin 171  206.  --Continue to cycle Tn until peaked, down-trending. --EKG with TWI but without acute ST/T changes.  --Continue to monitor telemetry. --Echo results pending. --Continue IV heparin, ASA 81mg  daily.  --Continue PTA BB and statin. --Will check LDL, LFTs. Will check A1C.  --Daily BMET and CBC. Current renal function and Hgb stable.  --Continue PRN SL nitro.  --Scheduled for [facility] tomorrow 08/16/19. NPO after midnight.  --Risks and benefits of cardiac catheterization have been discussed with the patient.  These include bleeding, infection, kidney damage, stroke, heart attack, death.  The patient understands these risks and is willing to proceed.  Hypertension --Restarted on PTA BB with Toprol 50mg  daily. --Consider transition to shorter acting BB this admission with either metoprolol tartrate or carvedilol and to allow for easier titration during admission. Coreg could also offer more BP support over that of HR. If additional BP support needed, or if echo shows reduced EF or labs show comorbid DM2, consider also addition of Losartan.  GERD --Continue PPI.   For questions or updates, please contact [place] Please consult [email or web address] for contact info under     Signed, Arvil Chaco, PA-C  08/15/2019 1:31 PM

## 2019-08-15 NOTE — Progress Notes (Signed)
*  PRELIMINARY RESULTS* Echocardiogram 2D Echocardiogram has been performed.  Sherrie Sport 08/15/2019, 2:07 PM

## 2019-08-15 NOTE — Plan of Care (Signed)
  Problem: Education: Goal: Understanding of CV disease, CV risk reduction, and recovery process will improve Outcome: Progressing   Problem: Activity: Goal: Ability to return to baseline activity level will improve Outcome: Progressing   Problem: Cardiovascular: Goal: Ability to achieve and maintain adequate cardiovascular perfusion will improve Outcome: Progressing Goal: Vascular access site(s) Level 0-1 will be maintained Outcome: Progressing   

## 2019-08-15 NOTE — ED Provider Notes (Signed)
[facility] Emergency Department Provider Note    First MD Initiated Contact with Patient 08/15/19 1204     (approximate)  I have reviewed the triage vital signs and the nursing notes.   HISTORY  Chief Complaint Chest Pain    HPI Cheryl Briggs is a 74 y.o. female who presents to the ER for evaluation of midsternal chest pressure feeling something was sitting on her chest and describes as very severe pain without radiation but also had associated diaphoresis and shortness of breath during this episode.  It lasted quite a while and finally abated on its own.  States she has been having stuttering episodes of chest pain over the past week.  Denies any history of coronary disease.  She is currently pain-free but still feels some generalized malaise.  She does not smoke.  Does have a history cholesterol.  As well as high blood pressure.    Past Medical History:  Diagnosis Date  . Allergy   . Arthritis   . Cancer ([place])    CLL   Family History  Problem Relation Age of Onset  . Breast cancer Neg Hx    Past Surgical History:  Procedure Laterality Date  . BACK SURGERY    . BLADDER REPAIR    . carpel tunnell Bilateral   . CHOLECYSTECTOMY    . COLONOSCOPY  2015  . SHOULDER SURGERY Bilateral    Patient Active Problem List   Diagnosis Date Noted  . Unstable angina ([place]) 08/15/2019  . 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 Authorizing Provider  Bioflavonoid Products (ESTER C PO) Take 1,000 mg by mouth daily.   Yes [provider]  calcium citrate-vitamin D (CITRACAL+D) 315-200 MG-UNIT tablet Take 1 tablet by mouth 2 (two) times daily.   Yes [provider]  Chlorpheniramine Maleate (CHLOR-TABLETS PO) Take 1 tablet by mouth as needed.    Yes [provider]  Cholecalciferol (VITAMIN D-1000 MAX ST) 25 MCG (1000 UT) tablet Take 3,000  Units by mouth daily.   Yes [provider]  desonide (DESOWEN) 0.05 % ointment desonide 0.05 % topical ointment 02/21/17  Yes [provider]  diclofenac (VOLTAREN) 75 MG EC tablet Take 75 mg by mouth 2 (two) times daily.  02/20/17  Yes [provider]  diclofenac sodium (VOLTAREN) 1 % GEL Apply topically 4 (four) times daily.   Yes [provider]  metoprolol succinate (TOPROL-XL) 50 MG 24 hr tablet Take 50 mg by mouth daily. 01/19/17  Yes [provider]  Multiple Vitamins-Minerals (MULTIVITAMIN WITH MINERALS) tablet Take 1 tablet by mouth daily.   Yes [provider]  Omeprazole (PRILOSEC PO) Take 1 tablet by mouth daily.    Yes [provider]  simvastatin (ZOCOR) 40 MG tablet Take 20 mg by mouth daily.    Yes [provider]    Allergies Corticosteroids, Other, Sulfa antibiotics, Clarithromycin, Cymbalta [duloxetine hcl], and Prednisone    Social History Social History   Tobacco Use  . Smoking status: Never Smoker  . Smokeless tobacco: Never Used  Substance Use Topics  . Alcohol use: No  . Drug use: No    Review of Systems Patient denies headaches, rhinorrhea, blurry vision, numbness, shortness of breath, chest pain, edema, cough, abdominal pain, nausea, vomiting, diarrhea, dysuria, fevers, rashes or hallucinations unless otherwise stated above in HPI. ____________________________________________   PHYSICAL EXAM:  VITAL  SIGNS: Vitals:   08/15/19 1022  BP: 137/60  Pulse: 75  Resp: 16  Temp: 97.8 F (36.6 C)  SpO2: 96%    Constitutional: Alert and oriented.  Eyes: Conjunctivae are normal.  Head: Atraumatic. Nose: No congestion/rhinnorhea. Mouth/Throat: Mucous membranes are moist.   Neck: No stridor. Painless ROM.  Cardiovascular: Normal rate, regular rhythm. Grossly normal heart sounds.  Good peripheral circulation. Respiratory: Normal respiratory effort.  No retractions. Lungs  CTAB. Gastrointestinal: Soft and nontender. No distention. No abdominal bruits. No CVA tenderness. Genitourinary:  Musculoskeletal: No lower extremity tenderness nor edema.  No joint effusions. Neurologic:  Normal speech and language. No gross focal neurologic deficits are appreciated. No facial droop Skin:  Skin is warm, dry and intact. No rash noted. Psychiatric: Mood and affect are normal. Speech and behavior are normal.  ____________________________________________   LABS (all labs ordered are listed, but only abnormal results are displayed)  Results for orders placed or performed during the hospital encounter of 08/15/19 (from the past 24 hour(s))  Basic metabolic panel     Status: Abnormal   Collection Time: 08/15/19 10:24 AM  Result Value Ref Range   Sodium 136 135 - 145 mmol/L   Potassium 4.4 3.5 - 5.1 mmol/L   Chloride 101 98 - 111 mmol/L   CO2 25 22 - 32 mmol/L   Glucose, Bld 132 (H) 70 - 99 mg/dL   BUN 7 (L) 8 - 23 mg/dL   Creatinine, Ser 0.89 0.44 - 1.00 mg/dL   Calcium 9.7 8.9 - 10.3 mg/dL   GFR calc non Af Amer >60 >60 mL/min   GFR calc Af Amer >60 >60 mL/min   Anion gap 10 5 - 15  CBC     Status: Abnormal   Collection Time: 08/15/19 10:24 AM  Result Value Ref Range   WBC 18.4 (H) 4.0 - 10.5 K/uL   RBC 4.72 3.87 - 5.11 MIL/uL   Hemoglobin 15.3 (H) 12.0 - 15.0 g/dL   HCT 45.9 36 - 46 %   MCV 97.2 80.0 - 100.0 fL   MCH 32.4 26.0 - 34.0 pg   MCHC 33.3 30.0 - 36.0 g/dL   RDW 12.6 11.5 - 15.5 %   Platelets 339 150 - 400 K/uL   nRBC 0.0 0.0 - 0.2 %  Troponin I (High Sensitivity)     Status: Abnormal   Collection Time: 08/15/19 10:24 AM  Result Value Ref Range   Troponin I (High Sensitivity) 171 (HH) <18 ng/L   ____________________________________________  EKG My review and personal interpretation at Time: 10:23   Indication: chest pain  Rate: 70  Rhythm: sinus Axis: normal Other: normal intervals, no  stemi ____________________________________________  RADIOLOGY  I personally reviewed all radiographic images ordered to evaluate for the above acute complaints and reviewed radiology reports and findings.  These findings were personally discussed with the patient.  Please see medical record for radiology report.  ____________________________________________   PROCEDURES  Procedure(s) performed:  .Critical Care Performed by: Merlyn Lot, MD Authorized by: Merlyn Lot, MD   Critical care provider statement:    Critical care time (minutes):  35   Critical care time was exclusive of:  Separately billable procedures and treating other patients   Critical care was necessary to treat or prevent imminent or life-threatening deterioration of the following conditions:  Cardiac failure   Critical care was time spent personally by me on the following activities:  Development of treatment plan with patient or surrogate, discussions with consultants, evaluation  of patient's response to treatment, examination of patient, obtaining history from patient or surrogate, ordering and performing treatments and interventions, ordering and review of laboratory studies, ordering and review of radiographic studies, pulse oximetry, re-evaluation of patient's condition and review of old [place] performed: yes ____________________________________________   INITIAL IMPRESSION / ASSESSMENT AND PLAN / ED COURSE  Pertinent labs & imaging results that were available during my care of the patient were reviewed by me and considered in my medical decision making (see chart for details).   DDX: ACS, pericarditis, esophagitis, boerhaaves, pe, dissection, pna, bronchitis, costochondritis   Cheryl Briggs is a 74 y.o. who presents to the ED with symptoms concerning for ACS as described above.  Her EKG shows some nonspecific changes no STEMI criteria.  Troponin is elevated 170.  She is  currently pain-free.  She did receive aspirin prior to arrival.  Her abdominal exam is soft and benign.  She is vaccinated against Covid.  No sign of pneumonia.  Does not seem consistent with PE or dissection.  I will heparinize.  Will consult cardiology and discussed case with hospitalist for admission.     The patient was evaluated in Emergency Department today for the symptoms described in the history of present illness. He/she was evaluated in the context of the global COVID-19 pandemic, which necessitated consideration that the patient might be at risk for infection with the SARS-CoV-2 virus that causes COVID-19. Institutional protocols and algorithms that pertain to the evaluation of patients at risk for COVID-19 are in a state of rapid change based on information released by regulatory bodies including the CDC and federal and state organizations. These policies and algorithms were followed during the patient's care in the ED.  As part of my medical decision making, I reviewed the following data within the [place] notes reviewed and incorporated, Labs reviewed, notes from prior ED visits and [place] Controlled Substance Database   ____________________________________________   FINAL CLINICAL IMPRESSION(S) / ED DIAGNOSES  Final diagnoses:  Unstable angina ([place])      NEW MEDICATIONS STARTED DURING THIS VISIT:  New Prescriptions   No medications on file     Note:  This document was prepared using Dragon voice recognition software and may include unintentional dictation errors.    Merlyn Lot, MD 08/15/19 1243

## 2019-08-16 ENCOUNTER — Other Ambulatory Visit: Payer: Self-pay

## 2019-08-16 ENCOUNTER — Encounter
Admission: EM | Disposition: A | Discharge status: Home / Self Care | Payer: Self-pay | Source: Home / Self Care | Attending: Internal Medicine

## 2019-08-16 ENCOUNTER — Encounter: Payer: Self-pay | Admitting: Internal Medicine

## 2019-08-16 DIAGNOSIS — K219 Gastro-esophageal reflux disease without esophagitis: Secondary | ICD-10-CM | POA: Diagnosis present

## 2019-08-16 DIAGNOSIS — I2510 Atherosclerotic heart disease of native coronary artery without angina pectoris: Secondary | ICD-10-CM | POA: Diagnosis not present

## 2019-08-16 DIAGNOSIS — I25110 Atherosclerotic heart disease of native coronary artery with unstable angina pectoris: Secondary | ICD-10-CM | POA: Diagnosis present

## 2019-08-16 DIAGNOSIS — Y840 Cardiac catheterization as the cause of abnormal reaction of the patient, or of later complication, without mention of misadventure at the time of the procedure: Secondary | ICD-10-CM | POA: Diagnosis not present

## 2019-08-16 DIAGNOSIS — I10 Essential (primary) hypertension: Secondary | ICD-10-CM | POA: Diagnosis not present

## 2019-08-16 DIAGNOSIS — I110 Hypertensive heart disease with heart failure: Secondary | ICD-10-CM | POA: Diagnosis present

## 2019-08-16 DIAGNOSIS — M1990 Unspecified osteoarthritis, unspecified site: Secondary | ICD-10-CM | POA: Diagnosis present

## 2019-08-16 DIAGNOSIS — I358 Other nonrheumatic aortic valve disorders: Secondary | ICD-10-CM | POA: Diagnosis present

## 2019-08-16 DIAGNOSIS — I5031 Acute diastolic (congestive) heart failure: Secondary | ICD-10-CM

## 2019-08-16 DIAGNOSIS — I200 Unstable angina: Secondary | ICD-10-CM | POA: Diagnosis not present

## 2019-08-16 DIAGNOSIS — Z20822 Contact with and (suspected) exposure to covid-19: Secondary | ICD-10-CM | POA: Diagnosis present

## 2019-08-16 DIAGNOSIS — I219 Acute myocardial infarction, unspecified: Secondary | ICD-10-CM | POA: Diagnosis not present

## 2019-08-16 DIAGNOSIS — E785 Hyperlipidemia, unspecified: Secondary | ICD-10-CM | POA: Diagnosis present

## 2019-08-16 DIAGNOSIS — Z882 Allergy status to sulfonamides status: Secondary | ICD-10-CM | POA: Diagnosis not present

## 2019-08-16 DIAGNOSIS — C9110 Chronic lymphocytic leukemia of B-cell type not having achieved remission: Secondary | ICD-10-CM | POA: Diagnosis present

## 2019-08-16 DIAGNOSIS — S5011XA Contusion of right forearm, initial encounter: Secondary | ICD-10-CM | POA: Diagnosis not present

## 2019-08-16 DIAGNOSIS — I4891 Unspecified atrial fibrillation: Secondary | ICD-10-CM | POA: Diagnosis not present

## 2019-08-16 DIAGNOSIS — T148XXA Other injury of unspecified body region, initial encounter: Secondary | ICD-10-CM | POA: Diagnosis not present

## 2019-08-16 DIAGNOSIS — Z881 Allergy status to other antibiotic agents status: Secondary | ICD-10-CM | POA: Diagnosis not present

## 2019-08-16 DIAGNOSIS — R001 Bradycardia, unspecified: Secondary | ICD-10-CM | POA: Diagnosis not present

## 2019-08-16 DIAGNOSIS — E7800 Pure hypercholesterolemia, unspecified: Secondary | ICD-10-CM | POA: Diagnosis not present

## 2019-08-16 DIAGNOSIS — I97630 Postprocedural hematoma of a circulatory system organ or structure following a cardiac catheterization: Secondary | ICD-10-CM | POA: Diagnosis not present

## 2019-08-16 DIAGNOSIS — Z888 Allergy status to other drugs, medicaments and biological substances status: Secondary | ICD-10-CM | POA: Diagnosis not present

## 2019-08-16 DIAGNOSIS — I480 Paroxysmal atrial fibrillation: Secondary | ICD-10-CM | POA: Diagnosis not present

## 2019-08-16 DIAGNOSIS — Z79899 Other long term (current) drug therapy: Secondary | ICD-10-CM | POA: Diagnosis not present

## 2019-08-16 DIAGNOSIS — I959 Hypotension, unspecified: Secondary | ICD-10-CM | POA: Diagnosis not present

## 2019-08-16 DIAGNOSIS — Z7722 Contact with and (suspected) exposure to environmental tobacco smoke (acute) (chronic): Secondary | ICD-10-CM | POA: Diagnosis present

## 2019-08-16 DIAGNOSIS — I214 Non-ST elevation (NSTEMI) myocardial infarction: Secondary | ICD-10-CM | POA: Diagnosis present

## 2019-08-16 DIAGNOSIS — R000 Tachycardia, unspecified: Secondary | ICD-10-CM | POA: Diagnosis not present

## 2019-08-16 DIAGNOSIS — R0789 Other chest pain: Secondary | ICD-10-CM | POA: Diagnosis present

## 2019-08-16 HISTORY — PX: LEFT HEART CATH AND CORONARY ANGIOGRAPHY: CATH118249

## 2019-08-16 HISTORY — PX: CORONARY STENT INTERVENTION: CATH118234

## 2019-08-16 LAB — CBC
HCT: 40.8 % (ref 36.0–46.0)
HCT: 41.3 % (ref 36.0–46.0)
Hemoglobin: 14.2 g/dL (ref 12.0–15.0)
Hemoglobin: 14.5 g/dL (ref 12.0–15.0)
MCH: 33.1 pg (ref 26.0–34.0)
MCH: 33 pg (ref 26.0–34.0)
MCHC: 34.8 g/dL (ref 30.0–36.0)
MCHC: 35.1 g/dL (ref 30.0–36.0)
MCV: 95.1 fL (ref 80.0–100.0)
MCV: 94.1 fL (ref 80.0–100.0)
Platelets: 318 10*3/uL (ref 150–400)
Platelets: 306 10*3/uL (ref 150–400)
RBC: 4.29 MIL/uL (ref 3.87–5.11)
RBC: 4.39 MIL/uL (ref 3.87–5.11)
RDW: 13.2 % (ref 11.5–15.5)
RDW: 13.2 % (ref 11.5–15.5)
WBC: 16.8 10*3/uL — ABNORMAL HIGH (ref 4.0–10.5)
WBC: 19.5 10*3/uL — ABNORMAL HIGH (ref 4.0–10.5)
nRBC: 0.2 % (ref 0.0–0.2)
nRBC: 0 % (ref 0.0–0.2)

## 2019-08-16 LAB — BASIC METABOLIC PANEL
Anion gap: 9 (ref 5–15)
Anion gap: 9 (ref 5–15)
BUN: 8 mg/dL (ref 8–23)
BUN: 7 mg/dL — ABNORMAL LOW (ref 8–23)
CO2: 25 mmol/L (ref 22–32)
CO2: 24 mmol/L (ref 22–32)
Calcium: 9 mg/dL (ref 8.9–10.3)
Calcium: 8.9 mg/dL (ref 8.9–10.3)
Chloride: 106 mmol/L (ref 98–111)
Chloride: 104 mmol/L (ref 98–111)
Creatinine, Ser: 0.71 mg/dL (ref 0.44–1.00)
Creatinine, Ser: 0.7 mg/dL (ref 0.44–1.00)
GFR calc Af Amer: >60 mL/min (ref >60)
GFR calc Af Amer: >60 mL/min (ref >60)
GFR calc non Af Amer: >60 mL/min (ref >60)
GFR calc non Af Amer: >60 mL/min (ref >60)
Glucose, Bld: 111 mg/dL — ABNORMAL HIGH (ref 70–99)
Glucose, Bld: 168 mg/dL — ABNORMAL HIGH (ref 70–99)
Potassium: 4.7 mmol/L (ref 3.5–5.1)
Potassium: 3.8 mmol/L (ref 3.5–5.1)
Sodium: 140 mmol/L (ref 135–145)
Sodium: 137 mmol/L (ref 135–145)

## 2019-08-16 LAB — POCT ACTIVATED CLOTTING TIME
Activated Clotting Time: 263 seconds
Activated Clotting Time: 268 seconds
Activated Clotting Time: 296 seconds

## 2019-08-16 LAB — TSH: TSH: 2.048 u[IU]/mL (ref 0.350–4.500)

## 2019-08-16 LAB — HEPARIN LEVEL (UNFRACTIONATED): Heparin Unfractionated: 0.59 IU/mL (ref 0.30–0.70)

## 2019-08-16 SURGERY — LEFT HEART CATH AND CORONARY ANGIOGRAPHY
Anesthesia: Moderate Sedation

## 2019-08-16 MED ORDER — NITROGLYCERIN IN D5W 200-5 MCG/ML-% IV SOLN: 0.0000 ug/min | INTRAVENOUS | Status: DC

## 2019-08-16 MED ORDER — ATROPINE SULFATE 1 MG/10ML IJ SOSY
PREFILLED_SYRINGE | INTRAMUSCULAR | Status: AC
  Filled 2019-08-16: qty 10

## 2019-08-16 MED ORDER — FUROSEMIDE 10 MG/ML IJ SOLN: 40.0000 mg | Freq: Once | INTRAMUSCULAR | Status: AC

## 2019-08-16 MED ORDER — TIROFIBAN (AGGRASTAT) BOLUS VIA INFUSION
INTRAVENOUS | Status: DC | PRN
  Administered 2019-08-16: 1735 ug via INTRAVENOUS

## 2019-08-16 MED ORDER — HEPARIN SODIUM (PORCINE) 1000 UNIT/ML IJ SOLN
INTRAMUSCULAR | Status: DC | PRN
  Administered 2019-08-16 (×2): 3500 [IU] via INTRAVENOUS
  Administered 2019-08-16: 2000 [IU] via INTRAVENOUS

## 2019-08-16 MED ORDER — IOHEXOL 300 MG/ML  SOLN
INTRAMUSCULAR | Status: DC | PRN
  Administered 2019-08-16: 160 mL

## 2019-08-16 MED ORDER — AMIODARONE HCL IN DEXTROSE 360-4.14 MG/200ML-% IV SOLN
60.0000 mg/h | INTRAVENOUS | Status: DC
  Administered 2019-08-16 (×2): 60 mg/h via INTRAVENOUS
  Filled 2019-08-16 (×2): qty 200

## 2019-08-16 MED ORDER — MIDAZOLAM HCL 2 MG/2ML IJ SOLN
INTRAMUSCULAR | Status: DC | PRN
  Administered 2019-08-16 (×3): 1 mg via INTRAVENOUS

## 2019-08-16 MED ORDER — ONDANSETRON HCL 4 MG/2ML IJ SOLN
INTRAMUSCULAR | Status: DC | PRN
  Administered 2019-08-16: 4 mg via INTRAVENOUS

## 2019-08-16 MED ORDER — SODIUM CHLORIDE 0.9 % IV SOLN: 250.0000 mL | INTRAVENOUS | Status: DC | PRN

## 2019-08-16 MED ORDER — TIROFIBAN HCL IV 12.5 MG/250 ML
INTRAVENOUS | Status: AC | PRN
  Administered 2019-08-16: 0.15 ug/kg/min via INTRAVENOUS

## 2019-08-16 MED ORDER — HEPARIN SODIUM (PORCINE) 1000 UNIT/ML IJ SOLN
INTRAMUSCULAR | Status: AC
  Filled 2019-08-16: qty 1

## 2019-08-16 MED ORDER — FENTANYL CITRATE (PF) 100 MCG/2ML IJ SOLN
INTRAMUSCULAR | Status: AC
  Filled 2019-08-16: qty 2

## 2019-08-16 MED ORDER — NOREPINEPHRINE BITARTRATE 1 MG/ML IV SOLN
INTRAVENOUS | Status: AC
  Filled 2019-08-16: qty 4

## 2019-08-16 MED ORDER — ATROPINE SULFATE 1 MG/10ML IJ SOSY
PREFILLED_SYRINGE | INTRAMUSCULAR | Status: DC | PRN
  Administered 2019-08-16: 1 mg via INTRAVENOUS

## 2019-08-16 MED ORDER — TICAGRELOR 90 MG PO TABS
ORAL_TABLET | ORAL | Status: AC
  Filled 2019-08-16: qty 2

## 2019-08-16 MED ORDER — NITROGLYCERIN 1 MG/10 ML FOR IR/CATH LAB
INTRA_ARTERIAL | Status: AC
  Filled 2019-08-16: qty 20

## 2019-08-16 MED ORDER — METOPROLOL TARTRATE 25 MG PO TABS
25.0000 mg | ORAL_TABLET | Freq: Four times a day (QID) | ORAL | Status: DC
  Administered 2019-08-16: 25 mg via ORAL
  Filled 2019-08-16 (×2): qty 1

## 2019-08-16 MED ORDER — ADENOSINE (DIAGNOSTIC) FOR INTRACORONARY USE
INTRAVENOUS | Status: DC | PRN
  Administered 2019-08-16 (×2): 120 ug via INTRACORONARY
  Administered 2019-08-16 (×2): 72 ug via INTRACORONARY

## 2019-08-16 MED ORDER — SODIUM CHLORIDE 0.9 % IV SOLN
INTRAVENOUS | Status: DC | PRN
  Administered 2019-08-16: 10 ug/min via INTRAVENOUS

## 2019-08-16 MED ORDER — HEPARIN (PORCINE) IN NACL 1000-0.9 UT/500ML-% IV SOLN
INTRAVENOUS | Status: DC | PRN
  Administered 2019-08-16 (×2): 100 mL

## 2019-08-16 MED ORDER — SODIUM CHLORIDE 0.9% FLUSH
3.0000 mL | INTRAVENOUS | Status: DC | PRN
  Administered 2019-08-17: 3 mL via INTRAVENOUS

## 2019-08-16 MED ORDER — METOPROLOL TARTRATE 5 MG/5ML IV SOLN
INTRAVENOUS | Status: DC | PRN
  Administered 2019-08-16: 2.5 mg via INTRAVENOUS

## 2019-08-16 MED ORDER — AMIODARONE HCL IN DEXTROSE 360-4.14 MG/200ML-% IV SOLN
30.0000 mg/h | INTRAVENOUS | Status: DC
  Administered 2019-08-16 (×2): 30 mg/h via INTRAVENOUS
  Filled 2019-08-16: qty 200

## 2019-08-16 MED ORDER — METOPROLOL TARTRATE 25 MG PO TABS
25.0000 mg | ORAL_TABLET | Freq: Two times a day (BID) | ORAL | Status: DC
  Administered 2019-08-17 – 2019-08-18 (×4): 25 mg via ORAL
  Filled 2019-08-16 (×4): qty 1

## 2019-08-16 MED ORDER — MIDAZOLAM HCL 2 MG/2ML IJ SOLN
INTRAMUSCULAR | Status: AC
  Filled 2019-08-16: qty 2

## 2019-08-16 MED ORDER — VERAPAMIL HCL 2.5 MG/ML IV SOLN
INTRAVENOUS | Status: AC
  Filled 2019-08-16: qty 2

## 2019-08-16 MED ORDER — VERAPAMIL HCL 2.5 MG/ML IV SOLN
INTRAVENOUS | Status: DC | PRN
  Administered 2019-08-16: 2.5 mg via INTRA_ARTERIAL

## 2019-08-16 MED ORDER — SODIUM CHLORIDE 0.9 % IV BOLUS
INTRAVENOUS | Status: AC | PRN
  Administered 2019-08-16: 200 mL via INTRAVENOUS

## 2019-08-16 MED ORDER — FENTANYL CITRATE (PF) 100 MCG/2ML IJ SOLN
INTRAMUSCULAR | Status: DC | PRN
  Administered 2019-08-16 (×3): 25 ug via INTRAVENOUS

## 2019-08-16 MED ORDER — LABETALOL HCL 5 MG/ML IV SOLN: 10.0000 mg | INTRAVENOUS | Status: AC | PRN

## 2019-08-16 MED ORDER — ADENOSINE 6 MG/2ML IV SOLN
INTRAVENOUS | Status: AC
  Filled 2019-08-16: qty 2

## 2019-08-16 MED ORDER — TICAGRELOR 90 MG PO TABS
90.0000 mg | ORAL_TABLET | Freq: Two times a day (BID) | ORAL | Status: DC
  Administered 2019-08-17 – 2019-08-22 (×9): 90 mg via ORAL
  Filled 2019-08-16 (×10): qty 1

## 2019-08-16 MED ORDER — NITROGLYCERIN 1 MG/10 ML FOR IR/CATH LAB
INTRA_ARTERIAL | Status: DC | PRN
  Administered 2019-08-16 (×2): 200 ug via INTRACORONARY
  Administered 2019-08-16: 100 ug via INTRACORONARY
  Administered 2019-08-16: 200 ug via INTRACORONARY

## 2019-08-16 MED ORDER — CHLORHEXIDINE GLUCONATE CLOTH 2 % EX PADS
6.0000 | MEDICATED_PAD | Freq: Every day | CUTANEOUS | Status: DC
  Administered 2019-08-16 – 2019-08-21 (×3): 6 via TOPICAL

## 2019-08-16 MED ORDER — NITROGLYCERIN IN D5W 200-5 MCG/ML-% IV SOLN
INTRAVENOUS | Status: AC | PRN
  Administered 2019-08-16: 10 ug/min via INTRAVENOUS

## 2019-08-16 MED ORDER — AMIODARONE LOAD VIA INFUSION
150.0000 mg | Freq: Once | INTRAVENOUS | Status: AC
  Administered 2019-08-16: 150 mg via INTRAVENOUS
  Filled 2019-08-16: qty 83.34

## 2019-08-16 MED ORDER — AMIODARONE HCL IN DEXTROSE 360-4.14 MG/200ML-% IV SOLN
INTRAVENOUS | Status: AC
  Filled 2019-08-16: qty 200

## 2019-08-16 MED ORDER — TIROFIBAN HCL IN NACL 5-0.9 MG/100ML-% IV SOLN
0.1500 ug/kg/min | INTRAVENOUS | Status: AC
  Administered 2019-08-16 – 2019-08-17 (×2): 0.15 ug/kg/min via INTRAVENOUS
  Filled 2019-08-16 (×5): qty 100

## 2019-08-16 MED ORDER — HEPARIN (PORCINE) IN NACL 1000-0.9 UT/500ML-% IV SOLN
INTRAVENOUS | Status: AC
  Filled 2019-08-16: qty 1000

## 2019-08-16 MED ORDER — METOPROLOL TARTRATE 5 MG/5ML IV SOLN
INTRAVENOUS | Status: AC
  Filled 2019-08-16: qty 5

## 2019-08-16 MED ORDER — MORPHINE SULFATE (PF) 2 MG/ML IV SOLN: 2.0000 mg | INTRAVENOUS | Status: AC | PRN

## 2019-08-16 MED ORDER — SODIUM CHLORIDE 0.9% FLUSH
3.0000 mL | Freq: Two times a day (BID) | INTRAVENOUS | Status: DC
  Administered 2019-08-16 – 2019-08-17 (×3): 3 mL via INTRAVENOUS

## 2019-08-16 MED ORDER — AMIODARONE HCL 200 MG PO TABS
200.0000 mg | ORAL_TABLET | Freq: Two times a day (BID) | ORAL | Status: DC
  Administered 2019-08-17 – 2019-08-18 (×5): 200 mg via ORAL
  Filled 2019-08-16 (×5): qty 1

## 2019-08-16 MED ORDER — METOCLOPRAMIDE HCL 5 MG/ML IJ SOLN
5.0000 mg | Freq: Three times a day (TID) | INTRAMUSCULAR | Status: DC
  Administered 2019-08-16 – 2019-08-21 (×16): 5 mg via INTRAVENOUS
  Filled 2019-08-16 (×17): qty 2

## 2019-08-16 MED ORDER — LIDOCAINE HCL (PF) 1 % IJ SOLN
INTRAMUSCULAR | Status: DC | PRN
  Administered 2019-08-16: 2 mL via SUBCUTANEOUS

## 2019-08-16 MED ORDER — HYDRALAZINE HCL 20 MG/ML IJ SOLN: 10.0000 mg | INTRAMUSCULAR | Status: AC | PRN

## 2019-08-16 MED ORDER — ONDANSETRON HCL 4 MG/2ML IJ SOLN
INTRAMUSCULAR | Status: AC
  Filled 2019-08-16: qty 2

## 2019-08-16 MED ORDER — MUPIROCIN 2 % EX OINT
1.0000 "application " | TOPICAL_OINTMENT | Freq: Two times a day (BID) | CUTANEOUS | Status: AC
  Administered 2019-08-16 – 2019-08-21 (×6): 1 via NASAL
  Filled 2019-08-16 (×2): qty 22

## 2019-08-16 MED ORDER — ATORVASTATIN CALCIUM 80 MG PO TABS
80.0000 mg | ORAL_TABLET | Freq: Every day | ORAL | Status: DC
  Administered 2019-08-16 – 2019-08-22 (×6): 80 mg via ORAL
  Filled 2019-08-16: qty 4
  Filled 2019-08-16 (×4): qty 1
  Filled 2019-08-16: qty 4
  Filled 2019-08-16: qty 1

## 2019-08-16 MED ORDER — LIDOCAINE HCL (PF) 1 % IJ SOLN
INTRAMUSCULAR | Status: AC
  Filled 2019-08-16: qty 30

## 2019-08-16 MED ORDER — HEPARIN SODIUM (PORCINE) 5000 UNIT/ML IJ SOLN
5000.0000 [IU] | Freq: Three times a day (TID) | INTRAMUSCULAR | Status: DC
  Administered 2019-08-16 – 2019-08-20 (×9): 5000 [IU] via SUBCUTANEOUS
  Filled 2019-08-16 (×9): qty 1

## 2019-08-16 MED ORDER — TICAGRELOR 90 MG PO TABS
ORAL_TABLET | ORAL | Status: DC | PRN
  Administered 2019-08-16: 180 mg via ORAL

## 2019-08-16 MED ORDER — FUROSEMIDE 10 MG/ML IJ SOLN
INTRAMUSCULAR | Status: AC
  Administered 2019-08-16: 40 mg via INTRAVENOUS
  Filled 2019-08-16: qty 4

## 2019-08-16 SURGICAL SUPPLY — 17 items
BALLN MINITREK RX 2.0X12 (BALLOONS) ×2
BALLN ~~LOC~~ TREK RX 2.75X8 (BALLOONS) ×2
BALLOON MINITREK RX 2.0X12 (BALLOONS) IMPLANT
BALLOON ~~LOC~~ TREK RX 2.75X8 (BALLOONS) IMPLANT
CATH 5F 110X4 TIG (CATHETERS) ×1 IMPLANT
CATH VISTA GUIDE 6FR JR4 (CATHETERS) ×1 IMPLANT
DEVICE INFLAT 30 PLUS (MISCELLANEOUS) ×1 IMPLANT
DEVICE RAD TR BAND REGULAR (VASCULAR PRODUCTS) ×1 IMPLANT
GLIDESHEATH SLEND SS 6F .021 (SHEATH) ×1 IMPLANT
GUIDEWIRE INQWIRE 1.5J.035X260 (WIRE) IMPLANT
INQWIRE 1.5J .035X260CM (WIRE) ×2
KIT MANI 3VAL PERCEP (MISCELLANEOUS) ×2 IMPLANT
PACK CARDIAC CATH (CUSTOM PROCEDURE TRAY) ×2 IMPLANT
STENT SYNERGY DES 2.25X20 (Permanent Stent) ×1 IMPLANT
WIRE G HI TQ BMW 190 (WIRE) ×1 IMPLANT
WIRE HITORQ VERSACORE ST 145CM (WIRE) ×1 IMPLANT
WIRE RUNTHROUGH .014X180CM (WIRE) ×1 IMPLANT

## 2019-08-16 NOTE — Progress Notes (Signed)
Informed by [facility] nurse that pt would be tranferring to [id number] due to complications. Report given to [facility] RN at bedside and belongings taken to pt's new room.

## 2019-08-16 NOTE — Plan of Care (Signed)
  Problem: Education: Goal: Individualized Educational Video(s) Outcome: Progressing Pt declined to watch cardiac cath video.  Verbalized understanding of pending procedure.  NPO status maintained.

## 2019-08-16 NOTE — Progress Notes (Addendum)
Progress Note    Cheryl Briggs  NGE:[id number] DOB: 09/15/1945  DOA: 08/15/2019 PCP: Joyice Faster, FNP      Brief Narrative:    Medical records reviewed and are as summarized below:  Cheryl Briggs is a 74 y.o. female with medical history significant for hyperlipidemia, CLL, who presented to the hospital with midsternal chest pain.  Her troponins were elevated and she was admitted to the hospital for acute NSTEMI.  She was seen in consultation by the cardiologist and she underwent left heart catheterization. "  Complex PCI to distal RCA, rPDA and rPLA with drug-eluting stent was performed.  During PCI, she developed transient bradycardia and hypotension and subsequently developed tachycardia and hypotension after receiving atropine and norepinephrine. She then developed atrial fibrillation with RVR requiring IV amiodarone infusion.   Assessment/Plan:   Principal Problem:   NSTEMI (non-ST elevated myocardial infarction) ([facility]) Active Problems:   CLL (chronic lymphocytic leukemia) (HCC)   Atrial fibrillation with RVR (HCC)   Acute NSTEMI s/p PCI with placement of drug-eluting stent: Continue aspirin, Brilinta, troponin infusion, Lipitor and metoprolol.  Atrial fibrillation with RVR: Converted to normal sinus rhythm.  She is on IV amiodarone infusion.  Taper off amiodarone as able.  Acute diastolic heart failure: She was given IV Lasix 40 mg x 1 dose during PCI.  Per cardiologist, LVEDP  was moderately elevated at the time of catheterization.  2D echo showed EF estimated at 55 to 60%, mild LVH.  Hyperlipidemia: Continue Lipitor  Hypotension and bradycardia during PCI : Resolved.  She required IV atropine and norepinephrine.    CLL/leukocytosis: Outpatient follow-up with oncologist.   Body mass index is 27.98 kg/m.  Diet Order            Diet Heart Room service appropriate? Yes; Fluid consistency: Thin  Diet effective now                        Medications:   . aspirin EC  81 mg Oral Daily  . atorvastatin  80 mg Oral Daily  . calcium-vitamin D  1 tablet Oral BID WC  . Chlorhexidine Gluconate Cloth  6 each Topical Daily  . cholecalciferol  3,000 Units Oral Daily  . heparin  5,000 Units Subcutaneous Q8H  . metoCLOPramide (REGLAN) injection  5 mg Intravenous Q8H  . metoprolol tartrate  25 mg Oral Q6H  . multivitamin with minerals  1 tablet Oral Daily  . ondansetron      . pantoprazole  40 mg Oral Daily  . sodium chloride flush  3 mL Intravenous Once  . sodium chloride flush  3 mL Intravenous Q12H  . sodium chloride flush  3 mL Intravenous Q12H  . ticagrelor  90 mg Oral BID   Continuous Infusions: . sodium chloride    . amiodarone 60 mg/hr (08/16/19 1341)   Followed by  . amiodarone    . nitroGLYCERIN Stopped (08/16/19 1404)  . tirofiban       Anti-infectives (From admission, onward)   Start     Dose/Rate Route Frequency Ordered Stop   08/15/19 1600  cefTRIAXone (ROCEPHIN) 2 g in sodium chloride 0.9 % 100 mL IVPB  Status:  Discontinued        2 g 200 mL/hr over 30 Minutes Intravenous Every 24 hours 08/15/19 1557 08/15/19 1558             Family Communication/Anticipated D/C date and plan/Code Status   DVT prophylaxis: heparin  injection 5,000 Units Start: 08/16/19 2200     Code Status: Full Code  Family Communication: Plan discussed with her daughter, Cheryl Briggs, at the bedside Disposition Plan:    Status is: Inpatient  Remains inpatient appropriate because:IV treatments appropriate due to intensity of illness or inability to take PO and Inpatient level of care appropriate due to severity of illness   Dispo: The patient is from: Home              Anticipated d/c is to: Home              Anticipated d/c date is: 3 days              Patient currently is not medically stable to d/c.           Subjective:   No chest pain or shortness of breath.  She feels  better.  Objective:    Vitals:   08/16/19 1130 08/16/19 1230 08/16/19 1300 08/16/19 1400  BP: 121/81 100/65 (!) 85/56 (!) 111/58  Pulse: (!) 106 94 79 71  Resp: 18 (!) 35 19 17  Temp:  98.3 F (36.8 C)    TempSrc:  Axillary    SpO2: 93% 97% 95% 98%  Weight:      Height:       No data found.   Intake/Output Summary (Last 24 hours) at 08/16/2019 1530 Last data filed at 08/16/2019 0711 Gross per 24 hour  Intake 621.26 ml  Output 1450 ml  Net -828.74 ml   Filed Weights   08/15/19 1022 08/15/19 1705 08/16/19 0824  Weight: 71.7 kg 69.4 kg 69.4 kg    Exam:  GEN: NAD SKIN: No rash.  No bleeding from right radial artery. EYES: EOMI ENT: MMM CV: RRR PULM: CTA B ABD: soft, ND, NT, +BS CNS: AAO x 3, non focal EXT: No edema or tenderness   Data Reviewed:   I have personally reviewed following labs and imaging studies:  Labs: Labs show the following:   Basic Metabolic Panel: Recent Labs  Lab 08/15/19 1024 08/16/19 0508  NA 136 140  K 4.4 4.7  CL 101 106  CO2 25 25  GLUCOSE 132* 111*  BUN 7* 8  CREATININE 0.89 0.71  CALCIUM 9.7 9.0   GFR Estimated Creatinine Clearance: 55.4 mL/min (by C-G formula based on SCr of 0.71 mg/dL). Liver Function Tests: No results for input(s): AST, ALT, ALKPHOS, BILITOT, PROT, ALBUMIN in the last 168 hours. No results for input(s): LIPASE, AMYLASE in the last 168 hours. No results for input(s): AMMONIA in the last 168 hours. Coagulation profile Recent Labs  Lab 08/15/19 1311  INR 1.0    CBC: Recent Labs  Lab 08/15/19 1024 08/16/19 0508  WBC 18.4* 16.8*  HGB 15.3* 14.2  HCT 45.9 40.8  MCV 97.2 95.1  PLT 339 318   Cardiac Enzymes: No results for input(s): CKTOTAL, CKMB, CKMBINDEX, TROPONINI in the last 168 hours. BNP (last 3 results) No results for input(s): PROBNP in the last 8760 hours. CBG: No results for input(s): GLUCAP in the last 168 hours. D-Dimer: No results for input(s): DDIMER in the last 72 hours. Hgb  A1c: Recent Labs    08/15/19 1024  HGBA1C 6.0*   Lipid Profile: Recent Labs    08/15/19 1024  CHOL 181  HDL 66  LDLCALC 82  TRIG 164*  CHOLHDL 2.7   Thyroid function studies: Recent Labs    08/16/19 0508  TSH 2.048   Anemia work up:  No results for input(s): VITAMINB12, FOLATE, FERRITIN, TIBC, IRON, RETICCTPCT in the last 72 hours. Sepsis Labs: Recent Labs  Lab 08/15/19 1024 08/16/19 0508  WBC 18.4* 16.8*    Microbiology Recent Results (from the past 240 hour(s))  SARS Coronavirus 2 by RT PCR (hospital order, performed in [facility] hospital lab) Nasopharyngeal Nasopharyngeal Swab     Status: None   Collection Time: 08/15/19 12:41 PM   Specimen: Nasopharyngeal Swab  Result Value Ref Range Status   SARS Coronavirus 2 NEGATIVE NEGATIVE Final    Comment: (NOTE) SARS-CoV-2 target nucleic acids are NOT DETECTED.  The SARS-CoV-2 RNA is generally detectable in upper and lower respiratory specimens during the acute phase of infection. The lowest concentration of SARS-CoV-2 viral copies this assay can detect is 250 copies / mL. A negative result does not preclude SARS-CoV-2 infection and should not be used as the sole basis for treatment or other patient management decisions.  A negative result may occur with improper specimen collection / handling, submission of specimen other than nasopharyngeal swab, presence of viral mutation(s) within the areas targeted by this assay, and inadequate number of viral copies (<250 copies / mL). A negative result must be combined with clinical observations, patient history, and epidemiological information.  Fact Sheet for Patients:   StrictlyIdeas.no  Fact Sheet for Healthcare Providers: BankingDealers.co.za  This test is not yet approved or  cleared by the [place] FDA and has been authorized for detection and/or diagnosis of SARS-CoV-2 by FDA under an Emergency Use  Authorization (EUA).  This EUA will remain in effect (meaning this test can be used) for the duration of the COVID-19 declaration under Section 564(b)(1) of the Act, 21 U.S.C. section 360bbb-3(b)(1), unless the authorization is terminated or revoked sooner.  Performed at [facility], [address] [place], [place] [address]     Procedures and diagnostic studies:  DG Chest 2 View  Result Date: 08/15/2019 CLINICAL DATA:  Chest pain EXAM: CHEST - 2 VIEW COMPARISON:  10/06/2017 FINDINGS: Heart is normal size. Linear scarring at the left base. Right lung clear. No effusions or acute bony abnormality. Degenerative changes in the shoulders and thoracic spine. Leftward scoliosis in the thoracolumbar spine. IMPRESSION: Chronic changes.  No active disease. Electronically Signed   By: Rolm Baptise M.D.   On: 08/15/2019 11:12   CARDIAC CATHETERIZATION  Result Date: 08/16/2019  A drug-eluting stent was successfully placed using a STENT SYNERGY DES 2.25X20.  A stent was successfully placed.  Conclusions: 1. 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 2. Moderately elevated left ventricular filling pressure. 3. Complex PCI to distal RCA, rPDA, and rPLA using Synergy 2.25 x 20 mm drug-eluting stent extending from the distal RCA into the rPDA, as well as angioplasty of the ostial rPL branch.  Intervention was complicated by no-reflow with transient hypotension/bradycardia and subsequent development of atrial fibrillation with rapid ventricular response.  Final angiogram shows 0% residual stenosis in the distal RCA/rPDA and 10% residual stenosis at ostial rPL with TIMI-2 flow. Recommendations: 1. Transfer to ICU for aggressive medical therapy and post-PCI monitoring. 2. Continue tirofiban infusion for 18  hours. 3. Dual antiplatelet therapy with aspirin and ticagrelor for at least 12 months. 4. Titrate nitroglycerine infusion for relief of chest pain. 5. Continue amiodarone infusion for rate control and hopefully conversion to  sinus rhythm.  If patient remains in atrial fibrillation tomorrow, IV heparin should be started (will defer heparin at this time given tirofiban infusion). 6. Aggressive secondary prevention. 7. Anticipate relook catheterization on Monday with PCI to the LCx.  I favor medical therapy for D2, given relatively small vessel size and need to stent back to the LAD, were intervention undertaken. Nelva Bush, MD [facility]  ECHOCARDIOGRAM COMPLETE  Result Date: 08/15/2019    ECHOCARDIOGRAM REPORT   Patient Name:   Cheryl Briggs Date of Exam: 08/15/2019 Medical Rec #:  [id number]         Height:       62.0 in Accession #:    [id number]        Weight:       158.1 lb Date of Birth:  02/27/1945         BSA:          1.730 m Patient Age:    43 years          BP:           137/60 mmHg Patient Gender: F                 HR:           75 bpm. Exam Location:  [facility] Procedure: 2D Echo, Cardiac Doppler and Color Doppler Indications:     NSTEMI 121.4  History:         Patient has no prior history of Echocardiogram examinations. No                  cardiac history listed in chart.  Sonographer:     Sherrie Sport RDCS (AE) Referring Phys:  [place] Diagnosing Phys: Kathlyn Sacramento MD IMPRESSIONS  1. Left ventricular ejection fraction, by estimation, is 55 to 60%. The left ventricle has normal function. The left ventricle has no regional wall motion abnormalities. There is mild left ventricular hypertrophy. Left ventricular diastolic parameters were normal.  2. Right ventricular systolic function is normal. The right ventricular size is normal. There is normal pulmonary artery systolic pressure.  3. The mitral valve is normal in structure. Mild mitral valve regurgitation. No evidence of mitral  stenosis.  4. The aortic valve is abnormal. Aortic valve regurgitation is not visualized. Mild to moderate aortic valve sclerosis/calcification is present, without any evidence of aortic stenosis. FINDINGS  Left Ventricle: Left ventricular ejection fraction, by estimation, is 55 to 60%. The left ventricle has normal function. The left ventricle has no regional wall motion abnormalities. The left ventricular internal cavity size was normal in size. There is  mild left ventricular hypertrophy. Left ventricular diastolic parameters were normal. Right Ventricle: The right ventricular size is normal. No increase in right ventricular wall thickness. Right ventricular systolic function is normal. There is normal pulmonary artery systolic pressure. The tricuspid regurgitant velocity is 2.17 m/s, and  with an assumed right atrial pressure of 10 mmHg, the estimated right ventricular systolic pressure is 72.5 mmHg. Left Atrium: Left atrial size was normal in size. Right Atrium: Right atrial size was normal in size. Pericardium: There is no evidence of pericardial effusion. Mitral Valve: The mitral valve is normal in structure. Normal mobility of the mitral valve leaflets. Mild mitral valve regurgitation. No evidence of mitral valve stenosis. Tricuspid Valve: The tricuspid valve is normal in structure. Tricuspid valve regurgitation is trivial. No evidence of tricuspid stenosis. Aortic Valve: The aortic valve is abnormal. Aortic valve regurgitation is not visualized.  Mild to moderate aortic valve sclerosis/calcification is present, without any evidence of aortic stenosis. Aortic valve mean gradient measures 4.0 mmHg. Aortic valve peak gradient measures 7.4 mmHg. Aortic valve area, by VTI measures 2.29 cm. Pulmonic Valve: The pulmonic valve was normal in structure. Pulmonic valve regurgitation is not visualized. No evidence of pulmonic stenosis. Aorta: The aortic root is normal in size and structure. Venous: The inferior vena  cava was not well visualized. IAS/Shunts: No atrial level shunt detected by color flow Doppler.  LEFT VENTRICLE PLAX 2D LVIDd:         4.34 cm  Diastology LVIDs:         2.87 cm  LV e' lateral:   7.29 cm/s LV PW:         1.28 cm  LV E/e' lateral: 12.7 LV IVS:        0.91 cm  LV e' medial:    6.42 cm/s LVOT diam:     2.00 cm  LV E/e' medial:  14.4 LV SV:         64 LV SV Index:   37 LVOT Area:     3.14 cm  RIGHT VENTRICLE RV Basal diam:  2.02 cm RV S prime:     12.20 cm/s TAPSE (M-mode): 3.5 cm LEFT ATRIUM             Index       RIGHT ATRIUM           Index LA diam:        3.30 cm 1.91 cm/m  RA Area:     11.00 cm LA Vol (A2C):   21.2 ml 12.26 ml/m RA Volume:   24.30 ml  14.05 ml/m LA Vol (A4C):   32.3 ml 18.67 ml/m LA Biplane Vol: 26.7 ml 15.44 ml/m  AORTIC VALVE                   PULMONIC VALVE AV Area (Vmax):    1.72 cm    PV Vmax:        0.61 m/s AV Area (Vmean):   1.56 cm    PV Peak grad:   1.5 mmHg AV Area (VTI):     2.29 cm    RVOT Peak grad: 2 mmHg AV Vmax:           136.33 cm/s AV Vmean:          92.233 cm/s AV VTI:            0.280 m AV Peak Grad:      7.4 mmHg AV Mean Grad:      4.0 mmHg LVOT Vmax:         74.60 cm/s LVOT Vmean:        45.900 cm/s LVOT VTI:          0.204 m LVOT/AV VTI ratio: 0.73  AORTA Ao Root diam: 2.70 cm MITRAL VALVE               TRICUSPID VALVE MV Area (PHT): 3.68 cm    TR Peak grad:   18.8 mmHg MV Decel Time: 206 msec    TR Vmax:        217.00 cm/s MV E velocity: 92.60 cm/s MV A velocity: 89.20 cm/s  SHUNTS MV E/A ratio:  1.04        Systemic VTI:  0.20 m  Systemic Diam: 2.00 cm Kathlyn Sacramento MD Electronically signed by Kathlyn Sacramento MD Signature Date/Time: 08/15/2019/4:07:35 PM    Final                LOS: 0 days   [facility]   Pager [phone]. If 7PM-7AM, please contact night-coverage at [email or web address]     08/16/2019, 3:30 PM

## 2019-08-16 NOTE — Progress Notes (Signed)
Progress Note  Patient Name: Cheryl Briggs Date of Encounter: 08/16/2019  [facility] Cardiologist: Thurston Hole  Subjective   Patient without complaints precatheterization.  She was found to have multivessel CAD and underwent PCI to the distal RCA complicated by no reflow into the distal branches.  During the procedure she had up to 8/10 chest pain, which has almost completely resolved.  Patient complains of some intermittent nausea.  No shortness of breath.  In addition, during the catheterization the patient had transient bradycardia and hypotension, with subsequent development of tachycardia and hypertension after receiving atropine and norepinephrine.  She will ultimately converted to atrial fibrillation with rapid ventricular response, prompting initiation of amiodarone infusion.  Inpatient Medications    Scheduled Meds: . ondansetron      . [MAR Hold] aspirin  324 mg Oral NOW   Or  . [MAR Hold] aspirin  300 mg Rectal NOW  . [MAR Hold] aspirin EC  81 mg Oral Daily  . atorvastatin  80 mg Oral Daily  . [MAR Hold] calcium-vitamin D  1 tablet Oral BID WC  . [MAR Hold] cholecalciferol  3,000 Units Oral Daily  . heparin  5,000 Units Subcutaneous Q8H  . metoprolol tartrate  25 mg Oral Q6H  . [MAR Hold] multivitamin with minerals  1 tablet Oral Daily  . [MAR Hold] pantoprazole  40 mg Oral Daily  . [MAR Hold] sodium chloride flush  3 mL Intravenous Once  . [MAR Hold] sodium chloride flush  3 mL Intravenous Q12H  . sodium chloride flush  3 mL Intravenous Q12H  . ticagrelor  90 mg Oral BID   Continuous Infusions: . sodium chloride    . amiodarone 60 mg/hr (08/16/19 1016)   Followed by  . amiodarone    . nitroGLYCERIN    . tirofiban     PRN Meds: sodium chloride, [MAR Hold] acetaminophen, hydrALAZINE, labetalol, morphine injection, [MAR Hold] nitroGLYCERIN, [MAR Hold] ondansetron (ZOFRAN) IV, sodium chloride flush   Vital Signs    Vitals:   08/16/19 1045 08/16/19 1100  08/16/19 1115 08/16/19 1130  BP: 130/87 (!) 126/92 117/73 121/81  Pulse: (!) 120 (!) 111 (!) 124 (!) 106  Resp: (!) 21 (!) 23 20 18   Temp:      TempSrc:      SpO2: 92% 92% 94% 93%  Weight:      Height:        Intake/Output Summary (Last 24 hours) at 08/16/2019 1224 Last data filed at 08/16/2019 0711 Gross per 24 hour  Intake 621.26 ml  Output 1450 ml  Net -828.74 ml   Last 3 Weights 08/16/2019 08/15/2019 08/15/2019  Weight (lbs) 153 lb 152 lb 14.4 oz 158 lb 1.1 oz  Weight (kg) 69.4 kg 69.355 kg 71.7 kg      Telemetry    Predominately sinus rhythm.  Patient converted to atrial fibrillation with rapid ventricular response during this morning's catheterization, which she remains in. - Personally Reviewed  ECG    Atrial fibrillation with rapid ventricular response, low voltage, and nonspecific ST/T changes. - Personally Reviewed  Physical Exam   GEN: No acute distress.   Neck: No JVD Cardiac:  Tachycardic and irregularly irregular without murmurs. Respiratory:  Mildly diminished breath sounds at the lung bases. GI: Soft, nontender, non-distended  MS: No edema; No deformity. Neuro:  Nonfocal  Psych: Normal affect   Labs    High Sensitivity Troponin:   Recent Labs  Lab 08/15/19 1024 08/15/19 1241 08/15/19 1656 08/15/19 2014  TROPONINIHS 171* 206* 341* 232*      Chemistry Recent Labs  Lab 08/15/19 1024 08/16/19 0508  NA 136 140  K 4.4 4.7  CL 101 106  CO2 25 25  GLUCOSE 132* 111*  BUN 7* 8  CREATININE 0.89 0.71  CALCIUM 9.7 9.0  GFRNONAA >60 >60  GFRAA >60 >60  ANIONGAP 10 9     Hematology Recent Labs  Lab 08/15/19 1024 08/16/19 0508  WBC 18.4* 16.8*  RBC 4.72 4.29  HGB 15.3* 14.2  HCT 45.9 40.8  MCV 97.2 95.1  MCH 32.4 33.1  MCHC 33.3 34.8  RDW 12.6 13.2  PLT 339 318    BNPNo results for input(s): BNP, PROBNP in the last 168 hours.   DDimer No results for input(s): DDIMER in the last 168 hours.   Radiology    DG Chest 2 View  Result  Date: 08/15/2019 CLINICAL DATA:  Chest pain EXAM: CHEST - 2 VIEW COMPARISON:  10/06/2017 FINDINGS: Heart is normal size. Linear scarring at the left base. Right lung clear. No effusions or acute bony abnormality. Degenerative changes in the shoulders and thoracic spine. Leftward scoliosis in the thoracolumbar spine. IMPRESSION: Chronic changes.  No active disease. Electronically Signed   By: Rolm Baptise M.D.   On: 08/15/2019 11:12   CARDIAC CATHETERIZATION  Result Date: 08/16/2019  A drug-eluting stent was successfully placed using a STENT SYNERGY DES 2.25X20.  A stent was successfully placed.  Conclusions: 1. 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 2. Moderately elevated left ventricular filling pressure. 3. Complex PCI to distal RCA, rPDA, and rPLA using Synergy 2.25 x 20 mm drug-eluting stent extending from the distal RCA into the rPDA, as well as angioplasty of the ostial rPL branch.  Intervention was complicated by no-reflow with transient hypotension/bradycardia and subsequent development of atrial fibrillation with rapid ventricular response.  Final angiogram shows 0% residual stenosis in the distal RCA/rPDA and 10% residual stenosis at ostial rPL with TIMI-2 flow. Recommendations: 1. Transfer to ICU for aggressive medical therapy and post-PCI monitoring. 2. Continue tirofiban infusion for 18 hours. 3. Dual antiplatelet therapy with aspirin and ticagrelor for at least 12 months. 4. Titrate nitroglycerine infusion for relief of chest pain. 5. Continue amiodarone infusion for rate control and hopefully conversion to sinus rhythm.  If patient remains in atrial fibrillation tomorrow, IV heparin should be started (will defer heparin at this time given tirofiban infusion). 6. Aggressive secondary prevention. 7.  Anticipate relook catheterization on Monday with PCI to the LCx.  I favor medical therapy for D2, given relatively small vessel size and need to stent back to the LAD, were intervention undertaken. Nelva Bush, MD [facility]  ECHOCARDIOGRAM COMPLETE  Result Date: 08/15/2019    ECHOCARDIOGRAM REPORT   Patient Name:   Cheryl Briggs Date of Exam: 08/15/2019 Medical Rec #:  [id number]         Height:       62.0 in Accession #:    [id number]        Weight:       158.1 lb Date of Birth:  Oct 01, 1945         BSA:          1.730 m Patient Age:    36 years          BP:  137/60 mmHg Patient Gender: F                 HR:           75 bpm. Exam Location:  [facility] Procedure: 2D Echo, Cardiac Doppler and Color Doppler Indications:     NSTEMI 121.4  History:         Patient has no prior history of Echocardiogram examinations. No                  cardiac history listed in chart.  Sonographer:     Sherrie Sport RDCS (AE) Referring Phys:  [place] Diagnosing Phys: Kathlyn Sacramento MD IMPRESSIONS  1. Left ventricular ejection fraction, by estimation, is 55 to 60%. The left ventricle has normal function. The left ventricle has no regional wall motion abnormalities. There is mild left ventricular hypertrophy. Left ventricular diastolic parameters were normal.  2. Right ventricular systolic function is normal. The right ventricular size is normal. There is normal pulmonary artery systolic pressure.  3. The mitral valve is normal in structure. Mild mitral valve regurgitation. No evidence of mitral stenosis.  4. The aortic valve is abnormal. Aortic valve regurgitation is not visualized. Mild to moderate aortic valve sclerosis/calcification is present, without any evidence of aortic stenosis. FINDINGS  Left Ventricle: Left ventricular ejection fraction, by estimation, is 55 to 60%. The left ventricle has normal function. The left ventricle has no regional wall motion abnormalities. The left ventricular internal  cavity size was normal in size. There is  mild left ventricular hypertrophy. Left ventricular diastolic parameters were normal. Right Ventricle: The right ventricular size is normal. No increase in right ventricular wall thickness. Right ventricular systolic function is normal. There is normal pulmonary artery systolic pressure. The tricuspid regurgitant velocity is 2.17 m/s, and  with an assumed right atrial pressure of 10 mmHg, the estimated right ventricular systolic pressure is 37.1 mmHg. Left Atrium: Left atrial size was normal in size. Right Atrium: Right atrial size was normal in size. Pericardium: There is no evidence of pericardial effusion. Mitral Valve: The mitral valve is normal in structure. Normal mobility of the mitral valve leaflets. Mild mitral valve regurgitation. No evidence of mitral valve stenosis. Tricuspid Valve: The tricuspid valve is normal in structure. Tricuspid valve regurgitation is trivial. No evidence of tricuspid stenosis. Aortic Valve: The aortic valve is abnormal. Aortic valve regurgitation is not visualized. Mild to moderate aortic valve sclerosis/calcification is present, without any evidence of aortic stenosis. Aortic valve mean gradient measures 4.0 mmHg. Aortic valve peak gradient measures 7.4 mmHg. Aortic valve area, by VTI measures 2.29 cm. Pulmonic Valve: The pulmonic valve was normal in structure. Pulmonic valve regurgitation is not visualized. No evidence of pulmonic stenosis. Aorta: The aortic root is normal in size and structure. Venous: The inferior vena cava was not well visualized. IAS/Shunts: No atrial level shunt detected by color flow Doppler.  LEFT VENTRICLE PLAX 2D LVIDd:         4.34 cm  Diastology LVIDs:         2.87 cm  LV e' lateral:   7.29 cm/s LV PW:         1.28 cm  LV E/e' lateral: 12.7 LV IVS:        0.91 cm  LV e' medial:    6.42 cm/s LVOT diam:     2.00 cm  LV E/e' medial:  14.4 LV SV:         64 LV SV Index:  37 LVOT Area:     3.14 cm  RIGHT  VENTRICLE RV Basal diam:  2.02 cm RV S prime:     12.20 cm/s TAPSE (M-mode): 3.5 cm LEFT ATRIUM             Index       RIGHT ATRIUM           Index LA diam:        3.30 cm 1.91 cm/m  RA Area:     11.00 cm LA Vol (A2C):   21.2 ml 12.26 ml/m RA Volume:   24.30 ml  14.05 ml/m LA Vol (A4C):   32.3 ml 18.67 ml/m LA Biplane Vol: 26.7 ml 15.44 ml/m  AORTIC VALVE                   PULMONIC VALVE AV Area (Vmax):    1.72 cm    PV Vmax:        0.61 m/s AV Area (Vmean):   1.56 cm    PV Peak grad:   1.5 mmHg AV Area (VTI):     2.29 cm    RVOT Peak grad: 2 mmHg AV Vmax:           136.33 cm/s AV Vmean:          92.233 cm/s AV VTI:            0.280 m AV Peak Grad:      7.4 mmHg AV Mean Grad:      4.0 mmHg LVOT Vmax:         74.60 cm/s LVOT Vmean:        45.900 cm/s LVOT VTI:          0.204 m LVOT/AV VTI ratio: 0.73  AORTA Ao Root diam: 2.70 cm MITRAL VALVE               TRICUSPID VALVE MV Area (PHT): 3.68 cm    TR Peak grad:   18.8 mmHg MV Decel Time: 206 msec    TR Vmax:        217.00 cm/s MV E velocity: 92.60 cm/s MV A velocity: 89.20 cm/s  SHUNTS MV E/A ratio:  1.04        Systemic VTI:  0.20 m                            Systemic Diam: 2.00 cm Kathlyn Sacramento MD Electronically signed by Kathlyn Sacramento MD Signature Date/Time: 08/15/2019/4:07:35 PM    Final     Cardiac Studies   LHC/PCI (08/16/19): Conclusions: 1. 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 2. Moderately elevated left ventricular filling pressure. 3. Complex PCI to distal RCA, rPDA, and rPLA using Synergy 2.25 x 20 mm drug-eluting stent extending from the distal RCA into the rPDA, as well as angioplasty of the ostial rPL branch.  Intervention was complicated by no-reflow with transient hypotension/bradycardia and subsequent development of atrial  fibrillation with rapid ventricular response.  Final angiogram shows 0% residual stenosis in the distal RCA/rPDA and 10% residual stenosis at ostial rPL with TIMI-2 flow.  TTE (08/15/19): 1. Left ventricular ejection fraction, by estimation, is 55 to 60%. The  left ventricle has normal function. The left ventricle has no regional  wall motion abnormalities.  There is mild left ventricular hypertrophy.  Left ventricular diastolic parameters  were normal.  2. Right ventricular systolic function is normal. The right ventricular  size is normal. There is normal pulmonary artery systolic pressure.  3. The mitral valve is normal in structure. Mild mitral valve  regurgitation. No evidence of mitral stenosis.  4. The aortic valve is abnormal. Aortic valve regurgitation is not  visualized. Mild to moderate aortic valve sclerosis/calcification is  present, without any evidence of aortic stenosis.   Patient Profile     74 y.o. 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  Assessment & Plan    NSTEMI: Patient found to have multivessel CAD on catheterization today, with culprit felt to be thrombotic lesion of the distal RCA.  She underwent PCI to the distal RCA, RPDA, and RPL complicated by no-reflow after stent post dilation with resultant hypotension, bradycardia, and ultimate development of atrial fibrillation with rapid ventricular response.  Patient transferred to ICU for ongoing management.  Continue tirofiban infusion for 18 hours.  Dual antiplatelet therapy with aspirin and ticagrelor for at least 12 months.  Plan for relook catheterization and likely PCI to LCx on Monday with Dr. Fletcher Anon.  Transition metoprolol to metoprolol tartrate 25 mg twice daily, as blood pressure allows, for improved heart rate control and antianginal therapy.  Obtain limited echo tomorrow to reevaluate LVEF following complex PCI and transient no-flow involving the  distal RCA.  Aggressive secondary prevention; escalate statin therapy to atorvastatin 80 mg daily.  Atrial fibrillation with rapid ventricular response: This developed during complicated PCI, likely mediated by acute ischemia as well as administration of atropine and norepinephrine.  Norepinephrine is off at this time.  Continue amiodarone infusion as well as metoprolol tartrate 25 mg every 6 hours.  If patient does not convert to sinus rhythm, consider initiation of heparin infusion tomorrow once tirofiban infusion has finished.  Check TSH.  Acute HFpEF: LVEF noted to be normal on echocardiogram yesterday though LVEDP was moderately elevated at the time of catheterization.  Give furosemide 40 mg IV x1, with redosing as needed based on urine output and symptoms.  Heart rate and rhythm control, as outlined above.  Hyperlipidemia: LDL 82 on admission.  Patient previously on simvastatin.  Switch simvastatin to atorvastatin 80 mg daily for target LDL less than 70.    For questions or updates, please contact [place] Please consult [email or web address] for contact info under [facility]  Signed, Nelva Bush, MD  08/16/2019, 12:24 PM

## 2019-08-16 NOTE — Consult Note (Signed)
CRITICAL CARE PROGRESS NOTE    Name: Cheryl Briggs MRN: [id number] DOB: 1945/06/07     LOS: 0   SUBJECTIVE FINDINGS & SIGNIFICANT EVENTS    Patient description:  Patient is seen at bedside and discussed with daughter.  She was admitted with CP due to STEMI s/p PCI, during procedure developed AFrVR and required amiodarone infusion with upgrade to ICU.   Lines/tubes :  Microbiology/Sepsis markers: Results for orders placed or performed during the hospital encounter of 08/15/19  SARS Coronavirus 2 by RT PCR (hospital order, performed in [facility] hospital lab) Nasopharyngeal Nasopharyngeal Swab     Status: None   Collection Time: 08/15/19 12:41 PM   Specimen: Nasopharyngeal Swab  Result Value Ref Range Status   SARS Coronavirus 2 NEGATIVE NEGATIVE Final    Comment: (NOTE) SARS-CoV-2 target nucleic acids are NOT DETECTED.  The SARS-CoV-2 RNA is generally detectable in upper and lower respiratory specimens during the acute phase of infection. The lowest concentration of SARS-CoV-2 viral copies this assay can detect is 250 copies / mL. A negative result does not preclude SARS-CoV-2 infection and should not be used as the sole basis for treatment or other patient management decisions.  A negative result may occur with improper specimen collection / handling, submission of specimen other than nasopharyngeal swab, presence of viral mutation(s) within the areas targeted by this assay, and inadequate number of viral copies (<250 copies / mL). A negative result must be combined with clinical observations, patient history, and epidemiological information.  Fact Sheet for Patients:   StrictlyIdeas.no  Fact Sheet for Healthcare  Providers: BankingDealers.co.za  This test is not yet approved or  cleared by the [place] FDA and has been authorized for detection and/or diagnosis of SARS-CoV-2 by FDA under an Emergency Use Authorization (EUA).  This EUA will remain in effect (meaning this test can be used) for the duration of the COVID-19 declaration under Section 564(b)(1) of the Act, 21 U.S.C. section 360bbb-3(b)(1), unless the authorization is terminated or revoked sooner.  Performed at [facility], [address], [place], [place] [address]     Anti-infectives:  Anti-infectives (From admission, onward)   Start     Dose/Rate Route Frequency Ordered Stop   08/15/19 1600  cefTRIAXone (ROCEPHIN) 2 g in sodium chloride 0.9 % 100 mL IVPB  Status:  Discontinued        2 g 200 mL/hr over 30 Minutes Intravenous Every 24 hours 08/15/19 1557 08/15/19 1558       Consults: Treatment Team:  Wellington Hampshire, MD    PAST MEDICAL HISTORY   Past Medical History:  Diagnosis Date  . Allergy   . Arthritis   . 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 BACK SURGERY    . BLADDER REPAIR    . carpel tunnell Bilateral   . CHOLECYSTECTOMY    . COLONOSCOPY  2015  . SHOULDER SURGERY Bilateral      FAMILY HISTORY   Family History  Problem Relation Age of Onset  . Breast cancer Neg Hx      SOCIAL HISTORY   Social History   Tobacco Use  . Smoking status: Never Smoker  . Smokeless tobacco: Never Used  Substance Use Topics  . Alcohol use: No  . Drug use: No     MEDICATIONS   Current Medication:  Current Facility-Administered Medications:  .  0.9 %  sodium chloride infusion, 250 mL, Intravenous, PRN, End, Harrell Gave, MD .  acetaminophen (TYLENOL) tablet 650 mg, 650 mg, Oral, Q4H PRN, End, Harrell Gave, MD .  amiodarone (NEXTERONE PREMIX) 360-4.14 MG/200ML-% (1.8 mg/mL) IV infusion, 60 mg/hr, Intravenous,  Continuous, Last Rate: 33.3 mL/hr at 08/16/19 1016, 60 mg/hr at 08/16/19 1016 **FOLLOWED BY** amiodarone (NEXTERONE PREMIX) 360-4.14 MG/200ML-% (1.8 mg/mL) IV infusion, 30 mg/hr, Intravenous, Continuous, End, Christopher, MD .  aspirin chewable tablet 324 mg, 324 mg, Oral, NOW **OR** aspirin suppository 300 mg, 300 mg, Rectal, NOW, End, Harrell Gave, MD .  aspirin EC tablet 81 mg, 81 mg, Oral, Daily, End, Christopher, MD .  atorvastatin (LIPITOR) tablet 80 mg, 80 mg, Oral, Daily, End, Harrell Gave, MD .  calcium-vitamin D (OSCAL WITH D) 500-200 MG-UNIT per tablet 1 tablet, 1 tablet, Oral, BID WC, End, Christopher, MD .  cholecalciferol (VITAMIN D) tablet 3,000 Units, 3,000 Units, Oral, Daily, End, Christopher, MD, 3,000 Units at 08/15/19 1719 .  heparin injection 5,000 Units, 5,000 Units, Subcutaneous, Q8H, End, Christopher, MD .  hydrALAZINE (APRESOLINE) injection 10 mg, 10 mg, Intravenous, Q20 Min PRN, End, Christopher, MD .  labetalol (NORMODYNE) injection 10 mg, 10 mg, Intravenous, Q10 min PRN, End, Christopher, MD .  metoCLOPramide (REGLAN) injection 5 mg, 5 mg, Intravenous, Q8H, Jelena Malicoat, MD .  metoprolol tartrate (LOPRESSOR) tablet 25 mg, 25 mg, Oral, Q6H, End, Christopher, MD, 25 mg at 08/16/19 1134 .  morphine 2 MG/ML injection 2 mg, 2 mg, Intravenous, Q1H PRN, End, Harrell Gave, MD .  multivitamin with minerals tablet 1 tablet, 1 tablet, Oral, Daily, End, Christopher, MD, 1 tablet at 08/15/19 1719 .  nitroGLYCERIN (NITROSTAT) SL tablet 0.4 mg, 0.4 mg, Sublingual, Q5 Min x 3 PRN, End, Christopher, MD .  nitroGLYCERIN 50 mg in dextrose 5 % 250 mL (0.2 mg/mL) infusion, 0-200 mcg/min, Intravenous, Titrated, End, Christopher, MD .  ondansetron (ZOFRAN) 4 MG/2ML injection, , , ,  .  ondansetron (ZOFRAN) injection 4 mg, 4 mg, Intravenous, Q6H PRN, End, Christopher, MD, 4 mg at 08/16/19 1153 .  pantoprazole (PROTONIX) EC tablet 40 mg, 40 mg, Oral, Daily, End, Christopher, MD .  sodium  chloride flush (NS) 0.9 % injection 3 mL, 3 mL, Intravenous, Once, End, Christopher, MD .  sodium chloride flush (NS) 0.9 % injection 3 mL, 3 mL, Intravenous, Q12H, End, Christopher, MD .  sodium chloride flush (NS) 0.9 % injection 3 mL, 3 mL, Intravenous, Q12H, End, Christopher, MD .  sodium chloride flush (NS) 0.9 % injection 3 mL, 3 mL, Intravenous, PRN, End, Harrell Gave, MD .  ticagrelor (BRILINTA) tablet 90 mg, 90 mg, Oral, BID, End, Christopher, MD .  tirofiban (AGGRASTAT) infusion 50 mcg/mL 100 mL, 0.15 mcg/kg/min, Intravenous, Continuous, Jennye Boroughs, MD    ALLERGIES   Corticosteroids, Other, Sulfa antibiotics, Clarithromycin, Cymbalta [duloxetine hcl], and Prednisone    REVIEW OF SYSTEMS     10 point ROS done and is negative except nausea  PHYSICAL EXAMINATION   Vital Signs: Temp:  [97.7 F (36.5 C)-98.7 F (37.1 C)] 98.3 F (36.8 C) (08/06 1230) Pulse Rate:  [77-124] 94 (08/06 1230) Resp:  [14-35] 35 (08/06 1230) BP: (100-177)/(64-92) 100/65 (08/06 1230) SpO2:  [92 %-100 %] 97 % (08/06 1230) Weight:  [69.4 kg] 69.4 kg (08/06 0824)  GENERAL:age appropriate  HEAD: Normocephalic, atraumatic.  EYES: Pupils equal, round, reactive to light.  No scleral icterus.  MOUTH: Moist mucosal membrane. NECK: Supple. No thyromegaly. No nodules. No JVD.  PULMONARY: CTAB CARDIOVASCULAR: S1 and S2. Regular rate  and rhythm. No murmurs, rubs, or gallops.  GASTROINTESTINAL: Soft, nontender, non-distended. No masses. Positive bowel sounds. No hepatosplenomegaly.  MUSCULOSKELETAL: No swelling, clubbing, or edema.  NEUROLOGIC: Mild distress due to acute illness SKIN:intact,warm,dry   PERTINENT DATA     Infusions: . sodium chloride    . amiodarone 60 mg/hr (08/16/19 1016)   Followed by  . amiodarone    . nitroGLYCERIN    . tirofiban     Scheduled Medications: . aspirin  324 mg Oral NOW   Or  . aspirin  300 mg Rectal NOW  . aspirin EC  81 mg Oral Daily  . atorvastatin   80 mg Oral Daily  . calcium-vitamin D  1 tablet Oral BID WC  . cholecalciferol  3,000 Units Oral Daily  . heparin  5,000 Units Subcutaneous Q8H  . metoCLOPramide (REGLAN) injection  5 mg Intravenous Q8H  . metoprolol tartrate  25 mg Oral Q6H  . multivitamin with minerals  1 tablet Oral Daily  . ondansetron      . pantoprazole  40 mg Oral Daily  . sodium chloride flush  3 mL Intravenous Once  . sodium chloride flush  3 mL Intravenous Q12H  . sodium chloride flush  3 mL Intravenous Q12H  . ticagrelor  90 mg Oral BID   PRN Medications: sodium chloride, acetaminophen, hydrALAZINE, labetalol, morphine injection, nitroGLYCERIN, ondansetron (ZOFRAN) IV, sodium chloride flush Hemodynamic parameters:   Intake/Output: 08/05 0701 - 08/06 0700 In: 621.3 [P.O.:240; I.V.:381.3] Out: 1250 [Urine:1250]  Ventilator  Settings:     LAB RESULTS:  Basic Metabolic Panel: Recent Labs  Lab 08/15/19 1024 08/16/19 0508  NA 136 140  K 4.4 4.7  CL 101 106  CO2 25 25  GLUCOSE 132* 111*  BUN 7* 8  CREATININE 0.89 0.71  CALCIUM 9.7 9.0   Liver Function Tests: No results for input(s): AST, ALT, ALKPHOS, BILITOT, PROT, ALBUMIN in the last 168 hours. No results for input(s): LIPASE, AMYLASE in the last 168 hours. No results for input(s): AMMONIA in the last 168 hours. CBC: Recent Labs  Lab 08/15/19 1024 08/16/19 0508  WBC 18.4* 16.8*  HGB 15.3* 14.2  HCT 45.9 40.8  MCV 97.2 95.1  PLT 339 318   Cardiac Enzymes: No results for input(s): CKTOTAL, CKMB, CKMBINDEX, TROPONINI in the last 168 hours. BNP: Invalid input(s): POCBNP CBG: No results for input(s): GLUCAP in the last 168 hours.     IMAGING RESULTS:  Imaging: DG Chest 2 View  Result Date: 08/15/2019 CLINICAL DATA:  Chest pain EXAM: CHEST - 2 VIEW COMPARISON:  10/06/2017 FINDINGS: Heart is normal size. Linear scarring at the left base. Right lung clear. No effusions or acute bony abnormality. Degenerative changes in the  shoulders and thoracic spine. Leftward scoliosis in the thoracolumbar spine. IMPRESSION: Chronic changes.  No active disease. Electronically Signed   By: Rolm Baptise M.D.   On: 08/15/2019 11:12   CARDIAC CATHETERIZATION  Result Date: 08/16/2019  A drug-eluting stent was successfully placed using a STENT SYNERGY DES 2.25X20.  A stent was successfully placed.  Conclusions: 1. 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 2. Moderately elevated left ventricular filling pressure. 3. Complex PCI to distal RCA, rPDA, and rPLA using Synergy 2.25 x 20 mm drug-eluting stent extending from the distal RCA into the rPDA, as well as  angioplasty of the ostial rPL branch.  Intervention was complicated by no-reflow with transient hypotension/bradycardia and subsequent development of atrial fibrillation with rapid ventricular response.  Final angiogram shows 0% residual stenosis in the distal RCA/rPDA and 10% residual stenosis at ostial rPL with TIMI-2 flow. Recommendations: 1. Transfer to ICU for aggressive medical therapy and post-PCI monitoring. 2. Continue tirofiban infusion for 18 hours. 3. Dual antiplatelet therapy with aspirin and ticagrelor for at least 12 months. 4. Titrate nitroglycerine infusion for relief of chest pain. 5. Continue amiodarone infusion for rate control and hopefully conversion to sinus rhythm.  If patient remains in atrial fibrillation tomorrow, IV heparin should be started (will defer heparin at this time given tirofiban infusion). 6. Aggressive secondary prevention. 7. Anticipate relook catheterization on Monday with PCI to the LCx.  I favor medical therapy for D2, given relatively small vessel size and need to stent back to the LAD, were intervention undertaken. Nelva Bush, MD [facility]  ECHOCARDIOGRAM COMPLETE  Result Date: 08/15/2019    ECHOCARDIOGRAM REPORT   Patient Name:   Cheryl Briggs Date of Exam: 08/15/2019 Medical Rec #:  [id number]         Height:       62.0 in Accession #:    [id number]        Weight:       158.1 lb Date of Birth:  April 14, 1945         BSA:          1.730 m Patient Age:    74 years          BP:           137/60 mmHg Patient Gender: F                 HR:           75 bpm. Exam Location:  [facility] Procedure: 2D Echo, Cardiac Doppler and Color Doppler Indications:     NSTEMI 121.4  History:         Patient has no prior history of Echocardiogram examinations. No                  cardiac history listed in chart.  Sonographer:     Sherrie Sport RDCS (AE) Referring Phys:  [place] Diagnosing Phys: Kathlyn Sacramento MD IMPRESSIONS  1. Left ventricular ejection fraction, by estimation, is 55 to 60%. The left ventricle has normal function. The left ventricle has no regional wall motion abnormalities. There is mild left ventricular hypertrophy. Left ventricular diastolic parameters were normal.  2. Right ventricular systolic function is normal. The right ventricular size is normal. There is normal pulmonary artery systolic pressure.  3. The mitral valve is normal in structure. Mild mitral valve regurgitation. No evidence of mitral stenosis.  4. The aortic valve is abnormal. Aortic valve regurgitation is not visualized. Mild to moderate aortic valve sclerosis/calcification is present, without any evidence of aortic stenosis. FINDINGS  Left Ventricle: Left ventricular ejection fraction, by estimation, is 55 to 60%. The left ventricle has normal function. The left ventricle has no regional wall motion abnormalities. The left ventricular internal cavity size was normal in size. There is  mild left ventricular hypertrophy. Left ventricular diastolic parameters were normal. Right Ventricle: The right ventricular size is normal. No increase in right ventricular wall  thickness. Right ventricular systolic function is normal. There is normal pulmonary artery systolic pressure. The tricuspid regurgitant velocity is 2.17 m/s, and  with an assumed  right atrial pressure of 10 mmHg, the estimated right ventricular systolic pressure is 06.2 mmHg. Left Atrium: Left atrial size was normal in size. Right Atrium: Right atrial size was normal in size. Pericardium: There is no evidence of pericardial effusion. Mitral Valve: The mitral valve is normal in structure. Normal mobility of the mitral valve leaflets. Mild mitral valve regurgitation. No evidence of mitral valve stenosis. Tricuspid Valve: The tricuspid valve is normal in structure. Tricuspid valve regurgitation is trivial. No evidence of tricuspid stenosis. Aortic Valve: The aortic valve is abnormal. Aortic valve regurgitation is not visualized. Mild to moderate aortic valve sclerosis/calcification is present, without any evidence of aortic stenosis. Aortic valve mean gradient measures 4.0 mmHg. Aortic valve peak gradient measures 7.4 mmHg. Aortic valve area, by VTI measures 2.29 cm. Pulmonic Valve: The pulmonic valve was normal in structure. Pulmonic valve regurgitation is not visualized. No evidence of pulmonic stenosis. Aorta: The aortic root is normal in size and structure. Venous: The inferior vena cava was not well visualized. IAS/Shunts: No atrial level shunt detected by color flow Doppler.  LEFT VENTRICLE PLAX 2D LVIDd:         4.34 cm  Diastology LVIDs:         2.87 cm  LV e' lateral:   7.29 cm/s LV PW:         1.28 cm  LV E/e' lateral: 12.7 LV IVS:        0.91 cm  LV e' medial:    6.42 cm/s LVOT diam:     2.00 cm  LV E/e' medial:  14.4 LV SV:         64 LV SV Index:   37 LVOT Area:     3.14 cm  RIGHT VENTRICLE RV Basal diam:  2.02 cm RV S prime:     12.20 cm/s TAPSE (M-mode): 3.5 cm LEFT ATRIUM             Index       RIGHT ATRIUM           Index LA diam:        3.30 cm 1.91 cm/m  RA Area:     11.00 cm LA Vol (A2C):    21.2 ml 12.26 ml/m RA Volume:   24.30 ml  14.05 ml/m LA Vol (A4C):   32.3 ml 18.67 ml/m LA Biplane Vol: 26.7 ml 15.44 ml/m  AORTIC VALVE                   PULMONIC VALVE AV Area (Vmax):    1.72 cm    PV Vmax:        0.61 m/s AV Area (Vmean):   1.56 cm    PV Peak grad:   1.5 mmHg AV Area (VTI):     2.29 cm    RVOT Peak grad: 2 mmHg AV Vmax:           136.33 cm/s AV Vmean:          92.233 cm/s AV VTI:            0.280 m AV Peak Grad:      7.4 mmHg AV Mean Grad:      4.0 mmHg LVOT Vmax:         74.60 cm/s LVOT Vmean:        45.900 cm/s LVOT VTI:          0.204 m LVOT/AV VTI ratio: 0.73  AORTA Ao Root diam: 2.70 cm MITRAL VALVE  TRICUSPID VALVE MV Area (PHT): 3.68 cm    TR Peak grad:   18.8 mmHg MV Decel Time: 206 msec    TR Vmax:        217.00 cm/s MV E velocity: 92.60 cm/s MV A velocity: 89.20 cm/s  SHUNTS MV E/A ratio:  1.04        Systemic VTI:  0.20 m                            Systemic Diam: 2.00 cm Kathlyn Sacramento MD Electronically signed by Kathlyn Sacramento MD Signature Date/Time: 08/15/2019/4:07:35 PM    Final    @PROBHOSP @ CARDIAC CATHETERIZATION  Result Date: 08/16/2019  A drug-eluting stent was successfully placed using a STENT SYNERGY DES 2.25X20.  A stent was successfully placed.  Conclusions: 1. 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 2. Moderately elevated left ventricular filling pressure. 3. Complex PCI to distal RCA, rPDA, and rPLA using Synergy 2.25 x 20 mm drug-eluting stent extending from the distal RCA into the rPDA, as well as angioplasty of the ostial rPL branch.  Intervention was complicated by no-reflow with transient hypotension/bradycardia and subsequent development of atrial fibrillation with rapid ventricular response.  Final angiogram shows 0% residual stenosis in the  distal RCA/rPDA and 10% residual stenosis at ostial rPL with TIMI-2 flow. Recommendations: 1. Transfer to ICU for aggressive medical therapy and post-PCI monitoring. 2. Continue tirofiban infusion for 18 hours. 3. Dual antiplatelet therapy with aspirin and ticagrelor for at least 12 months. 4. Titrate nitroglycerine infusion for relief of chest pain. 5. Continue amiodarone infusion for rate control and hopefully conversion to sinus rhythm.  If patient remains in atrial fibrillation tomorrow, IV heparin should be started (will defer heparin at this time given tirofiban infusion). 6. Aggressive secondary prevention. 7. Anticipate relook catheterization on Monday with PCI to the LCx.  I favor medical therapy for D2, given relatively small vessel size and need to stent back to the LAD, were intervention undertaken. Nelva Bush, MD [facility]  ECHOCARDIOGRAM COMPLETE  Result Date: 08/15/2019    ECHOCARDIOGRAM REPORT   Patient Name:   Cheryl Briggs Date of Exam: 08/15/2019 Medical Rec #:  [id number]         Height:       62.0 in Accession #:    [id number]        Weight:       158.1 lb Date of Birth:  [birth year]         BSA:          1.730 m Patient Age:    76 years          BP:           137/60 mmHg Patient Gender: F                 HR:           75 bpm. Exam Location:  [facility] Procedure: 2D Echo, Cardiac Doppler and Color Doppler Indications:     NSTEMI 121.4  History:         Patient has no prior history of Echocardiogram examinations. No                  cardiac history listed in chart.  Sonographer:  Sherrie Sport RDCS (AE) Referring Phys:  [id number] Royce Macadamia AGBATA Diagnosing Phys: Kathlyn Sacramento MD IMPRESSIONS  1. Left ventricular ejection fraction, by estimation, is 55 to 60%. The left ventricle has normal function. The left ventricle has no regional wall motion abnormalities. There is mild left ventricular hypertrophy. Left ventricular diastolic parameters were normal.  2. Right ventricular systolic function  is normal. The right ventricular size is normal. There is normal pulmonary artery systolic pressure.  3. The mitral valve is normal in structure. Mild mitral valve regurgitation. No evidence of mitral stenosis.  4. The aortic valve is abnormal. Aortic valve regurgitation is not visualized. Mild to moderate aortic valve sclerosis/calcification is present, without any evidence of aortic stenosis. FINDINGS  Left Ventricle: Left ventricular ejection fraction, by estimation, is 55 to 60%. The left ventricle has normal function. The left ventricle has no regional wall motion abnormalities. The left ventricular internal cavity size was normal in size. There is  mild left ventricular hypertrophy. Left ventricular diastolic parameters were normal. Right Ventricle: The right ventricular size is normal. No increase in right ventricular wall thickness. Right ventricular systolic function is normal. There is normal pulmonary artery systolic pressure. The tricuspid regurgitant velocity is 2.17 m/s, and  with an assumed right atrial pressure of 10 mmHg, the estimated right ventricular systolic pressure is 95.0 mmHg. Left Atrium: Left atrial size was normal in size. Right Atrium: Right atrial size was normal in size. Pericardium: There is no evidence of pericardial effusion. Mitral Valve: The mitral valve is normal in structure. Normal mobility of the mitral valve leaflets. Mild mitral valve regurgitation. No evidence of mitral valve stenosis. Tricuspid Valve: The tricuspid valve is normal in structure. Tricuspid valve regurgitation is trivial. No evidence of tricuspid stenosis. Aortic Valve: The aortic valve is abnormal. Aortic valve regurgitation is not visualized. Mild to moderate aortic valve sclerosis/calcification is present, without any evidence of aortic stenosis. Aortic valve mean gradient measures 4.0 mmHg. Aortic valve peak gradient measures 7.4 mmHg. Aortic valve area, by VTI measures 2.29 cm. Pulmonic Valve: The  pulmonic valve was normal in structure. Pulmonic valve regurgitation is not visualized. No evidence of pulmonic stenosis. Aorta: The aortic root is normal in size and structure. Venous: The inferior vena cava was not well visualized. IAS/Shunts: No atrial level shunt detected by color flow Doppler.  LEFT VENTRICLE PLAX 2D LVIDd:         4.34 cm  Diastology LVIDs:         2.87 cm  LV e' lateral:   7.29 cm/s LV PW:         1.28 cm  LV E/e' lateral: 12.7 LV IVS:        0.91 cm  LV e' medial:    6.42 cm/s LVOT diam:     2.00 cm  LV E/e' medial:  14.4 LV SV:         64 LV SV Index:   37 LVOT Area:     3.14 cm  RIGHT VENTRICLE RV Basal diam:  2.02 cm RV S prime:     12.20 cm/s TAPSE (M-mode): 3.5 cm LEFT ATRIUM             Index       RIGHT ATRIUM           Index LA diam:        3.30 cm 1.91 cm/m  RA Area:     11.00 cm LA Vol (A2C):   21.2 ml 12.26 ml/m RA  Volume:   24.30 ml  14.05 ml/m LA Vol (A4C):   32.3 ml 18.67 ml/m LA Biplane Vol: 26.7 ml 15.44 ml/m  AORTIC VALVE                   PULMONIC VALVE AV Area (Vmax):    1.72 cm    PV Vmax:        0.61 m/s AV Area (Vmean):   1.56 cm    PV Peak grad:   1.5 mmHg AV Area (VTI):     2.29 cm    RVOT Peak grad: 2 mmHg AV Vmax:           136.33 cm/s AV Vmean:          92.233 cm/s AV VTI:            0.280 m AV Peak Grad:      7.4 mmHg AV Mean Grad:      4.0 mmHg LVOT Vmax:         74.60 cm/s LVOT Vmean:        45.900 cm/s LVOT VTI:          0.204 m LVOT/AV VTI ratio: 0.73  AORTA Ao Root diam: 2.70 cm MITRAL VALVE               TRICUSPID VALVE MV Area (PHT): 3.68 cm    TR Peak grad:   18.8 mmHg MV Decel Time: 206 msec    TR Vmax:        217.00 cm/s MV E velocity: 92.60 cm/s MV A velocity: 89.20 cm/s  SHUNTS MV E/A ratio:  1.04        Systemic VTI:  0.20 m                            Systemic Diam: 2.00 cm Kathlyn Sacramento MD Electronically signed by Kathlyn Sacramento MD Signature Date/Time: 08/15/2019/4:07:35 PM    Final      ASSESSMENT AND PLAN    -Multidisciplinary  rounds held today  NSTEMI  - s/p PCI- cardio on case - thrombotic lesion of the distal RCA -appreciate input   - tirofiban  - asprin and ticagrelor - 80mth  - repeat LHC Monday  - metop tartrate 25 bid   - tte  -statin 80mg  qhs  Atrial fibrillation with rapid ventricular response -developed during PCI -amiodarone  ICU monitoring  GI/Nutrition GI PROPHYLAXIS as indicated DIET-->TF's as tolerated Constipation protocol as indicated  ENDO - ICU hypoglycemic\Hyperglycemia protocol -check FSBS per protocol   ELECTROLYTES -follow labs as needed -replace as needed -pharmacy consultation   DVT/GI PRX ordered -SCDs  TRANSFUSIONS AS NEEDED MONITOR FSBS ASSESS the need for LABS as needed   Critical care provider statement:    Critical care time (minutes):  33   Critical care time was exclusive of:  Separately billable procedures and treating other patients   Critical care was necessary to treat or prevent imminent or life-threatening deterioration of the following conditions:  NSTEMi, AFrvr, advanced age with comorbid conditions   Critical care was time spent personally by me on the following activities:  Development of treatment plan with patient or surrogate, discussions with consultants, evaluation of patient's response to treatment, examination of patient, obtaining history from patient or surrogate, ordering and performing treatments and interventions, ordering and review of laboratory studies and re-evaluation of patient's condition.  I assumed direction of critical care for this patient from another  provider in my specialty: no    This document was prepared using Dragon voice recognition software and may include unintentional dictation errors.    Ottie Glazier, M.D.  Division of [place]

## 2019-08-16 NOTE — Interval H&P Note (Signed)
History and Physical Interval Note:  08/16/2019 8:52 AM  Cheryl Briggs  has presented today for surgery, with the diagnosis of Non ST Elevation Myocardial Infarction.  The various methods of treatment have been discussed with the patient and family. After consideration of risks, benefits and other options for treatment, the patient has consented to  Procedure(s): LEFT HEART CATH AND CORONARY ANGIOGRAPHY (N/A) as a surgical intervention.  The patient's history has been reviewed, patient examined, no change in status, stable for surgery.  I have reviewed the patient's chart and labs.  Questions were answered to the patient's satisfaction.    Cath Lab Visit (complete for each Cath Lab visit)  Clinical Evaluation Leading to the Procedure:   ACS: Yes.    Non-ACS:  N/A  Cheryl Briggs

## 2019-08-16 NOTE — Progress Notes (Signed)
Brief Pharmacy Note  Pharmacy consulted for Aggrastat. Order for Aggrastat 0.15 mcg/kg/min x 18 hours. Will follow up CBC with morning labs to assess platelets.  Patient also started on amiodarone for afib. No significant drug interactions at this time.  Will continue to monitor patient.  Dorena Bodo, PharmD

## 2019-08-16 NOTE — Consult Note (Signed)
[place] for Heparin Infusion Indication: chest pain/ACS  Allergies  Allergen Reactions  . Corticosteroids     Other reaction(s): Headache  . Other Other (See Comments)  . Sulfa Antibiotics Anaphylaxis  . Clarithromycin     Other reaction(s): Other (See Comments) Rectal bleeding   . Cymbalta [Duloxetine Hcl] Diarrhea  . Prednisone Other (See Comments)    Color changes in face, headache and difficulty walking    Patient Measurements: Height: 5\' 2"  (157.5 cm) Weight: 69.4 kg (152 lb 14.4 oz) IBW/kg (Calculated) : 50.1 Heparin Dosing Weight: 65.3 kg  Vital Signs: Temp: 98.7 F (37.1 C) (08/06 0513) Temp Source: Oral (08/06 0513) BP: 140/87 (08/06 0513) Pulse Rate: 80 (08/06 0513)  Labs: Recent Labs    08/15/19 1024 08/15/19 1024 08/15/19 1241 08/15/19 1311 08/15/19 1656 08/15/19 2014 08/16/19 0508  HGB 15.3*  --   --   --   --   --  14.2  HCT 45.9  --   --   --   --   --  40.8  PLT 339  --   --   --   --   --  318  APTT  --   --   --  30  --   --   --   LABPROT  --   --   --  12.8  --   --   --   INR  --   --   --  1.0  --   --   --   HEPARINUNFRC  --   --   --   --   --  0.51 0.59  CREATININE 0.89  --   --   --   --   --  0.71  TROPONINIHS 171*   < > 206*  --  341* 232*  --    < > = values in this interval not displayed.    Estimated Creatinine Clearance: 55.4 mL/min (by C-G formula based on SCr of 0.71 mg/dL).   Medications:  No anticoagulation prior to admission per chart review  Assessment: Patient is a 74 y/o F with medical history significant for CLL who presented to the ED with chief complaint of chest pain. Troponin 171. Pharmacy has been consulted to start heparin infusion for ACS.  Baseline H&H, platelets within normal limits. Baseline aPTT and PT-INR within normal limits.   Goal of Therapy:  Heparin level 0.3-0.7 units/ml Monitor platelets by anticoagulation protocol: Yes   Plan:  --8/6 at 0508 HL =  0.59, therapeutic x 2. CBC stable.  Continue heparin at current rate of 750 units/hr --Daily CBC & HL per protocol  Hart Robinsons A 08/16/2019,6:12 AM

## 2019-08-17 ENCOUNTER — Inpatient Hospital Stay (HOSPITAL_COMMUNITY)
Admit: 2019-08-17 | Discharge: 2019-08-17 | Disposition: A | Discharge status: Home / Self Care | Payer: Medicare Other | Attending: Internal Medicine | Admitting: Internal Medicine

## 2019-08-17 DIAGNOSIS — I214 Non-ST elevation (NSTEMI) myocardial infarction: Secondary | ICD-10-CM

## 2019-08-17 LAB — ECHOCARDIOGRAM LIMITED
AR max vel: 1.3 cm2
AV Peak grad: 13.1 mmHg
Ao pk vel: 1.81 m/s
Area-P 1/2: 4.21 cm2
Height: 62 in
S' Lateral: 2.46 cm
Weight: 2447.99 oz

## 2019-08-17 LAB — CBC
HCT: 42.3 % (ref 36.0–46.0)
Hemoglobin: 13.9 g/dL (ref 12.0–15.0)
MCH: 32.4 pg (ref 26.0–34.0)
MCHC: 32.9 g/dL (ref 30.0–36.0)
MCV: 98.6 fL (ref 80.0–100.0)
Platelets: 298 10*3/uL (ref 150–400)
RBC: 4.29 MIL/uL (ref 3.87–5.11)
RDW: 13.2 % (ref 11.5–15.5)
WBC: 18.1 10*3/uL — ABNORMAL HIGH (ref 4.0–10.5)
nRBC: 0 % (ref 0.0–0.2)

## 2019-08-17 LAB — BASIC METABOLIC PANEL
Anion gap: 12 (ref 5–15)
BUN: 6 mg/dL — ABNORMAL LOW (ref 8–23)
CO2: 22 mmol/L (ref 22–32)
Calcium: 8.8 mg/dL — ABNORMAL LOW (ref 8.9–10.3)
Chloride: 104 mmol/L (ref 98–111)
Creatinine, Ser: 0.8 mg/dL (ref 0.44–1.00)
GFR calc Af Amer: >60 mL/min (ref >60)
GFR calc non Af Amer: >60 mL/min (ref >60)
Glucose, Bld: 116 mg/dL — ABNORMAL HIGH (ref 70–99)
Potassium: 3.5 mmol/L (ref 3.5–5.1)
Sodium: 138 mmol/L (ref 135–145)

## 2019-08-17 LAB — MAGNESIUM: Magnesium: 2.1 mg/dL (ref 1.7–2.4)

## 2019-08-17 MED ORDER — SODIUM CHLORIDE 0.9% FLUSH: 3.0000 mL | INTRAVENOUS | Status: DC | PRN

## 2019-08-17 MED ORDER — SODIUM CHLORIDE 0.9 % IV SOLN: 250.0000 mL | INTRAVENOUS | Status: DC | PRN

## 2019-08-17 MED ORDER — SODIUM CHLORIDE 0.9% FLUSH: 3.0000 mL | Freq: Two times a day (BID) | INTRAVENOUS | Status: DC

## 2019-08-17 MED ORDER — SODIUM CHLORIDE 0.9 % WEIGHT BASED INFUSION
1.0000 mL/kg/h | INTRAVENOUS | Status: DC
  Administered 2019-08-19: 1 mL/kg/h via INTRAVENOUS

## 2019-08-17 MED ORDER — SODIUM CHLORIDE 0.9 % WEIGHT BASED INFUSION
3.0000 mL/kg/h | INTRAVENOUS | Status: DC
  Administered 2019-08-19: 3 mL/kg/h via INTRAVENOUS

## 2019-08-17 NOTE — Progress Notes (Signed)
Good day. Up in chair. Up in room. No complaints of chest pain.

## 2019-08-17 NOTE — Progress Notes (Signed)
CRITICAL CARE PROGRESS NOTE    Name: Cheryl Briggs MRN: [id number] DOB: May 23, 1945     LOS: 1   SUBJECTIVE FINDINGS & SIGNIFICANT EVENTS    Patient description:  Patient is seen at bedside and discussed with daughter.  She was admitted with CP due to STEMI s/p PCI, during procedure developed AFrVR and required amiodarone infusion with upgrade to ICU.     08/17/19- patient is stable, reports clinical improvement with resolution of nausea. Denies chest discomfort.  Off all infusions this am. Plan to optimize for downgrade to medical floor with additional cardiac workup in process including LHC.    Lines/tubes :  Microbiology/Sepsis markers: Results for orders placed or performed during the hospital encounter of 08/15/19  SARS Coronavirus 2 by RT PCR (hospital order, performed in [facility] hospital lab) Nasopharyngeal Nasopharyngeal Swab     Status: None   Collection Time: 08/15/19 12:41 PM   Specimen: Nasopharyngeal Swab  Result Value Ref Range Status   SARS Coronavirus 2 NEGATIVE NEGATIVE Final    Comment: (NOTE) SARS-CoV-2 target nucleic acids are NOT DETECTED.  The SARS-CoV-2 RNA is generally detectable in upper and lower respiratory specimens during the acute phase of infection. The lowest concentration of SARS-CoV-2 viral copies this assay can detect is 250 copies / mL. A negative result does not preclude SARS-CoV-2 infection and should not be used as the sole basis for treatment or other patient management decisions.  A negative result may occur with improper specimen collection / handling, submission of specimen other than nasopharyngeal swab, presence of viral mutation(s) within the areas targeted by this assay, and inadequate number of viral copies (<250 copies / mL). A negative result must  be combined with clinical observations, patient history, and epidemiological information.  Fact Sheet for Patients:   StrictlyIdeas.no  Fact Sheet for Healthcare Providers: BankingDealers.co.za  This test is not yet approved or  cleared by the [place] FDA and has been authorized for detection and/or diagnosis of SARS-CoV-2 by FDA under an Emergency Use Authorization (EUA).  This EUA will remain in effect (meaning this test can be used) for the duration of the COVID-19 declaration under Section 564(b)(1) of the Act, 21 U.S.C. section 360bbb-3(b)(1), unless the authorization is terminated or revoked sooner.  Performed at [facility], [address], [place], [place] [address]     Anti-infectives:  Anti-infectives (From admission, onward)   Start     Dose/Rate Route Frequency Ordered Stop   08/15/19 1600  cefTRIAXone (ROCEPHIN) 2 g in sodium chloride 0.9 % 100 mL IVPB  Status:  Discontinued        2 g 200 mL/hr over 30 Minutes Intravenous Every 24 hours 08/15/19 1557 08/15/19 1558       Consults: Treatment Team:  Wellington Hampshire, MD Ottie Glazier, MD    PAST MEDICAL HISTORY   Past Medical History:  Diagnosis Date  . Allergy   . Arthritis   . Cancer ([place])    CLL     SURGICAL HISTORY   Past Surgical History:  Procedure Laterality Date  . BACK SURGERY    . BLADDER REPAIR    . carpel tunnell Bilateral   . CHOLECYSTECTOMY    . COLONOSCOPY  2015  . SHOULDER SURGERY Bilateral      FAMILY HISTORY   Family History  Problem Relation Age of Onset  . Breast cancer Neg Hx      SOCIAL HISTORY   Social History   Tobacco Use  .  Smoking status: Never Smoker  . Smokeless tobacco: Never Used  Substance Use Topics  . Alcohol use: No  . Drug use: No     MEDICATIONS   Current Medication:  Current Facility-Administered Medications:  .  0.9 %  sodium chloride infusion, 250 mL, Intravenous,  PRN, End, Christopher, MD .  acetaminophen (TYLENOL) tablet 650 mg, 650 mg, Oral, Q4H PRN, End, Christopher, MD .  amiodarone (PACERONE) tablet 200 mg, 200 mg, Oral, BID, Larey Dresser, MD, 200 mg at 08/17/19 0000 .  aspirin EC tablet 81 mg, 81 mg, Oral, Daily, End, Christopher, MD .  atorvastatin (LIPITOR) tablet 80 mg, 80 mg, Oral, Daily, End, Christopher, MD, 80 mg at 08/16/19 1758 .  calcium-vitamin D (OSCAL WITH D) 500-200 MG-UNIT per tablet 1 tablet, 1 tablet, Oral, BID WC, End, Christopher, MD, 1 tablet at 08/16/19 1758 .  Chlorhexidine Gluconate Cloth 2 % PADS 6 each, 6 each, Topical, Daily, Ottie Glazier, MD, 6 each at 08/16/19 1420 .  cholecalciferol (VITAMIN D) tablet 3,000 Units, 3,000 Units, Oral, Daily, End, Christopher, MD, 3,000 Units at 08/15/19 1719 .  heparin injection 5,000 Units, 5,000 Units, Subcutaneous, Q8H, End, Christopher, MD, 5,000 Units at 08/17/19 0552 .  metoCLOPramide (REGLAN) injection 5 mg, 5 mg, Intravenous, Q8H, Lonette Stevison, MD, 5 mg at 08/17/19 0553 .  metoprolol tartrate (LOPRESSOR) tablet 25 mg, 25 mg, Oral, BID, End, Christopher, MD .  morphine 2 MG/ML injection 2 mg, 2 mg, Intravenous, Q1H PRN, End, Christopher, MD .  multivitamin with minerals tablet 1 tablet, 1 tablet, Oral, Daily, End, Christopher, MD, 1 tablet at 08/15/19 1719 .  mupirocin ointment (BACTROBAN) 2 % 1 application, 1 application, Nasal, BID, Jennye Boroughs, MD, 1 application at 97/98/92 2211 .  nitroGLYCERIN (NITROSTAT) SL tablet 0.4 mg, 0.4 mg, Sublingual, Q5 Min x 3 PRN, End, Christopher, MD .  nitroGLYCERIN 50 mg in dextrose 5 % 250 mL (0.2 mg/mL) infusion, 0-200 mcg/min, Intravenous, Titrated, End, Harrell Gave, MD, Held at 08/16/19 1404 .  ondansetron (ZOFRAN) injection 4 mg, 4 mg, Intravenous, Q6H PRN, End, Christopher, MD, 4 mg at 08/16/19 1153 .  pantoprazole (PROTONIX) EC tablet 40 mg, 40 mg, Oral, Daily, End, Christopher, MD .  sodium chloride flush (NS) 0.9 % injection 3  mL, 3 mL, Intravenous, Once, End, Christopher, MD .  sodium chloride flush (NS) 0.9 % injection 3 mL, 3 mL, Intravenous, Q12H, End, Christopher, MD, 3 mL at 08/16/19 2214 .  sodium chloride flush (NS) 0.9 % injection 3 mL, 3 mL, Intravenous, Q12H, End, Christopher, MD, 3 mL at 08/16/19 2213 .  sodium chloride flush (NS) 0.9 % injection 3 mL, 3 mL, Intravenous, PRN, End, Harrell Gave, MD .  ticagrelor (BRILINTA) tablet 90 mg, 90 mg, Oral, BID, End, Harrell Gave, MD    ALLERGIES   Corticosteroids, Other, Sulfa antibiotics, Clarithromycin, Cymbalta [duloxetine hcl], and Prednisone    REVIEW OF SYSTEMS     10 point ROS done and is negative except nausea  PHYSICAL EXAMINATION   Vital Signs: Temp:  [98.3 F (36.8 C)-98.9 F (37.2 C)] 98.9 F (37.2 C) (08/07 0400) Pulse Rate:  [58-124] 69 (08/07 0600) Resp:  [15-35] 18 (08/07 0600) BP: (85-158)/(56-92) 133/61 (08/07 0600) SpO2:  [92 %-98 %] 96 % (08/07 0600)  GENERAL:age appropriate  HEAD: Normocephalic, atraumatic.  EYES: Pupils equal, round, reactive to light.  No scleral icterus.  MOUTH: Moist mucosal membrane. NECK: Supple. No thyromegaly. No nodules. No JVD.  PULMONARY: CTAB CARDIOVASCULAR: S1 and S2. Regular rate  and rhythm. No murmurs, rubs, or gallops.  GASTROINTESTINAL: Soft, nontender, non-distended. No masses. Positive bowel sounds. No hepatosplenomegaly.  MUSCULOSKELETAL: No swelling, clubbing, or edema.  NEUROLOGIC: Mild distress due to acute illness SKIN:intact,warm,dry   PERTINENT DATA     Infusions: . sodium chloride    . nitroGLYCERIN Stopped (08/16/19 1404)   Scheduled Medications: . amiodarone  200 mg Oral BID  . aspirin EC  81 mg Oral Daily  . atorvastatin  80 mg Oral Daily  . calcium-vitamin D  1 tablet Oral BID WC  . Chlorhexidine Gluconate Cloth  6 each Topical Daily  . cholecalciferol  3,000 Units Oral Daily  . heparin  5,000 Units Subcutaneous Q8H  . metoCLOPramide (REGLAN) injection  5 mg  Intravenous Q8H  . metoprolol tartrate  25 mg Oral BID  . multivitamin with minerals  1 tablet Oral Daily  . mupirocin ointment  1 application Nasal BID  . pantoprazole  40 mg Oral Daily  . sodium chloride flush  3 mL Intravenous Once  . sodium chloride flush  3 mL Intravenous Q12H  . sodium chloride flush  3 mL Intravenous Q12H  . ticagrelor  90 mg Oral BID   PRN Medications: sodium chloride, acetaminophen, morphine injection, nitroGLYCERIN, ondansetron (ZOFRAN) IV, sodium chloride flush Hemodynamic parameters:   Intake/Output: 08/06 0701 - 08/07 0700 In: 326.5 [I.V.:326.5] Out: 7425 [ZDGLO:7564]  Ventilator  Settings:     LAB RESULTS:  Basic Metabolic Panel: Recent Labs  Lab 08/15/19 1024 08/15/19 1024 08/16/19 0508 08/16/19 0508 08/16/19 1756 08/17/19 0732  NA 136  --  140  --  137 138  K 4.4   < > 4.7   < > 3.8 3.5  CL 101  --  106  --  104 104  CO2 25  --  25  --  24 22  GLUCOSE 132*  --  111*  --  168* 116*  BUN 7*  --  8  --  7* 6*  CREATININE 0.89  --  0.71  --  0.70 0.80  CALCIUM 9.7  --  9.0  --  8.9 8.8*   < > = values in this interval not displayed.   Liver Function Tests: No results for input(s): AST, ALT, ALKPHOS, BILITOT, PROT, ALBUMIN in the last 168 hours. No results for input(s): LIPASE, AMYLASE in the last 168 hours. No results for input(s): AMMONIA in the last 168 hours. CBC: Recent Labs  Lab 08/15/19 1024 08/16/19 0508 08/16/19 1756 08/17/19 0732  WBC 18.4* 16.8* 19.5* 18.1*  HGB 15.3* 14.2 14.5 13.9  HCT 45.9 40.8 41.3 42.3  MCV 97.2 95.1 94.1 98.6  PLT 339 318 306 298   Cardiac Enzymes: No results for input(s): CKTOTAL, CKMB, CKMBINDEX, TROPONINI in the last 168 hours. BNP: Invalid input(s): POCBNP CBG: No results for input(s): GLUCAP in the last 168 hours.     IMAGING RESULTS:  Imaging: DG Chest 2 View  Result Date: 08/15/2019 CLINICAL DATA:  Chest pain EXAM: CHEST - 2 VIEW COMPARISON:  10/06/2017 FINDINGS: Heart is  normal size. Linear scarring at the left base. Right lung clear. No effusions or acute bony abnormality. Degenerative changes in the shoulders and thoracic spine. Leftward scoliosis in the thoracolumbar spine. IMPRESSION: Chronic changes.  No active disease. Electronically Signed   By: Rolm Baptise M.D.   On: 08/15/2019 11:12   CARDIAC CATHETERIZATION  Result Date: 08/16/2019  A drug-eluting stent was successfully placed using a STENT SYNERGY DES 2.25X20.  A stent  was successfully placed.  Conclusions: 1. 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 2. Moderately elevated left ventricular filling pressure. 3. Complex PCI to distal RCA, rPDA, and rPLA using Synergy 2.25 x 20 mm drug-eluting stent extending from the distal RCA into the rPDA, as well as angioplasty of the ostial rPL branch.  Intervention was complicated by no-reflow with transient hypotension/bradycardia and subsequent development of atrial fibrillation with rapid ventricular response.  Final angiogram shows 0% residual stenosis in the distal RCA/rPDA and 10% residual stenosis at ostial rPL with TIMI-2 flow. Recommendations: 1. Transfer to ICU for aggressive medical therapy and post-PCI monitoring. 2. Continue tirofiban infusion for 18 hours. 3. Dual antiplatelet therapy with aspirin and ticagrelor for at least 12 months. 4. Titrate nitroglycerine infusion for relief of chest pain. 5. Continue amiodarone infusion for rate control and hopefully conversion to sinus rhythm.  If patient remains in atrial fibrillation tomorrow, IV heparin should be started (will defer heparin at this time given tirofiban infusion). 6. Aggressive secondary prevention. 7. Anticipate relook catheterization on Monday with PCI to the LCx.  I favor medical therapy for D2, given  relatively small vessel size and need to stent back to the LAD, were intervention undertaken. Nelva Bush, MD [facility]  ECHOCARDIOGRAM COMPLETE  Result Date: 08/15/2019    ECHOCARDIOGRAM REPORT   Patient Name:   GORDIE BELVIN Date of Exam: 08/15/2019 Medical Rec #:  [id number]         Height:       62.0 in Accession #:    [id number]        Weight:       158.1 lb Date of Birth:  September 29, 1945         BSA:          1.730 m Patient Age:    74 years          BP:           137/60 mmHg Patient Gender: F                 HR:           75 bpm. Exam Location:  [facility] Procedure: 2D Echo, Cardiac Doppler and Color Doppler Indications:     NSTEMI 121.4  History:         Patient has no prior history of Echocardiogram examinations. No                  cardiac history listed in chart.  Sonographer:     Sherrie Sport RDCS (AE) Referring Phys:  [place] Diagnosing Phys: Kathlyn Sacramento MD IMPRESSIONS  1. Left ventricular ejection fraction, by estimation, is 55 to 60%. The left ventricle has normal function. The left ventricle has no regional wall motion abnormalities. There is mild left ventricular hypertrophy. Left ventricular diastolic parameters were normal.  2. Right ventricular systolic function is normal. The right ventricular size is normal. There is normal pulmonary artery systolic pressure.  3. The mitral valve is normal in structure. Mild mitral valve regurgitation. No evidence of mitral stenosis.  4. The aortic valve is abnormal. Aortic valve regurgitation is not visualized. Mild to moderate aortic valve sclerosis/calcification is present, without any evidence of aortic stenosis. FINDINGS  Left Ventricle: Left ventricular ejection fraction, by estimation, is 55 to 60%. The left  ventricle has normal function. The left ventricle has no regional wall motion abnormalities. The left ventricular internal cavity size was normal in size. There is  mild left ventricular hypertrophy. Left ventricular diastolic  parameters were normal. Right Ventricle: The right ventricular size is normal. No increase in right ventricular wall thickness. Right ventricular systolic function is normal. There is normal pulmonary artery systolic pressure. The tricuspid regurgitant velocity is 2.17 m/s, and  with an assumed right atrial pressure of 10 mmHg, the estimated right ventricular systolic pressure is 52.8 mmHg. Left Atrium: Left atrial size was normal in size. Right Atrium: Right atrial size was normal in size. Pericardium: There is no evidence of pericardial effusion. Mitral Valve: The mitral valve is normal in structure. Normal mobility of the mitral valve leaflets. Mild mitral valve regurgitation. No evidence of mitral valve stenosis. Tricuspid Valve: The tricuspid valve is normal in structure. Tricuspid valve regurgitation is trivial. No evidence of tricuspid stenosis. Aortic Valve: The aortic valve is abnormal. Aortic valve regurgitation is not visualized. Mild to moderate aortic valve sclerosis/calcification is present, without any evidence of aortic stenosis. Aortic valve mean gradient measures 4.0 mmHg. Aortic valve peak gradient measures 7.4 mmHg. Aortic valve area, by VTI measures 2.29 cm. Pulmonic Valve: The pulmonic valve was normal in structure. Pulmonic valve regurgitation is not visualized. No evidence of pulmonic stenosis. Aorta: The aortic root is normal in size and structure. Venous: The inferior vena cava was not well visualized. IAS/Shunts: No atrial level shunt detected by color flow Doppler.  LEFT VENTRICLE PLAX 2D LVIDd:         4.34 cm  Diastology LVIDs:         2.87 cm  LV e' lateral:   7.29 cm/s LV PW:         1.28 cm  LV E/e' lateral: 12.7 LV IVS:        0.91 cm  LV e' medial:    6.42 cm/s LVOT diam:     2.00 cm  LV E/e' medial:  14.4 LV SV:         64 LV SV Index:   37 LVOT Area:     3.14 cm  RIGHT VENTRICLE RV Basal diam:  2.02 cm RV S prime:     12.20 cm/s TAPSE (M-mode): 3.5 cm LEFT ATRIUM              Index       RIGHT ATRIUM           Index LA diam:        3.30 cm 1.91 cm/m  RA Area:     11.00 cm LA Vol (A2C):   21.2 ml 12.26 ml/m RA Volume:   24.30 ml  14.05 ml/m LA Vol (A4C):   32.3 ml 18.67 ml/m LA Biplane Vol: 26.7 ml 15.44 ml/m  AORTIC VALVE                   PULMONIC VALVE AV Area (Vmax):    1.72 cm    PV Vmax:        0.61 m/s AV Area (Vmean):   1.56 cm    PV Peak grad:   1.5 mmHg AV Area (VTI):     2.29 cm    RVOT Peak grad: 2 mmHg AV Vmax:           136.33 cm/s AV Vmean:          92.233 cm/s AV VTI:  0.280 m AV Peak Grad:      7.4 mmHg AV Mean Grad:      4.0 mmHg LVOT Vmax:         74.60 cm/s LVOT Vmean:        45.900 cm/s LVOT VTI:          0.204 m LVOT/AV VTI ratio: 0.73  AORTA Ao Root diam: 2.70 cm MITRAL VALVE               TRICUSPID VALVE MV Area (PHT): 3.68 cm    TR Peak grad:   18.8 mmHg MV Decel Time: 206 msec    TR Vmax:        217.00 cm/s MV E velocity: 92.60 cm/s MV A velocity: 89.20 cm/s  SHUNTS MV E/A ratio:  1.04        Systemic VTI:  0.20 m                            Systemic Diam: 2.00 cm Kathlyn Sacramento MD Electronically signed by Kathlyn Sacramento MD Signature Date/Time: 08/15/2019/4:07:35 PM    Final    @PROBHOSP @ CARDIAC CATHETERIZATION  Result Date: 08/16/2019  A drug-eluting stent was successfully placed using a STENT SYNERGY DES 2.25X20.  A stent was successfully placed.  Conclusions: 1. 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 2. Moderately elevated left ventricular filling pressure. 3. Complex PCI to distal RCA, rPDA, and rPLA using Synergy 2.25 x 20 mm drug-eluting stent extending from the distal RCA into the rPDA, as well as angioplasty of the ostial rPL branch.  Intervention was complicated by no-reflow with transient hypotension/bradycardia and subsequent  development of atrial fibrillation with rapid ventricular response.  Final angiogram shows 0% residual stenosis in the distal RCA/rPDA and 10% residual stenosis at ostial rPL with TIMI-2 flow. Recommendations: 1. Transfer to ICU for aggressive medical therapy and post-PCI monitoring. 2. Continue tirofiban infusion for 18 hours. 3. Dual antiplatelet therapy with aspirin and ticagrelor for at least 12 months. 4. Titrate nitroglycerine infusion for relief of chest pain. 5. Continue amiodarone infusion for rate control and hopefully conversion to sinus rhythm.  If patient remains in atrial fibrillation tomorrow, IV heparin should be started (will defer heparin at this time given tirofiban infusion). 6. Aggressive secondary prevention. 7. Anticipate relook catheterization on Monday with PCI to the LCx.  I favor medical therapy for D2, given relatively small vessel size and need to stent back to the LAD, were intervention undertaken. Nelva Bush, MD [facility]    ASSESSMENT AND PLAN    -Multidisciplinary rounds held today  NSTEMI  - s/p PCI- cardio on case - thrombotic lesion of the distal RCA -appreciate input   - tirofiban  - asprin and ticagrelor - 57mth  - repeat LHC Monday  - metop tartrate 25 bid   - tte  -statin 80mg  qhs  Atrial fibrillation with rapid ventricular response -developed during PCI -amiodarone  ICU monitoring  GI/Nutrition GI PROPHYLAXIS as indicated DIET-->TF's as tolerated Constipation protocol as indicated  ENDO - ICU hypoglycemic\Hyperglycemia protocol -check FSBS per protocol   ELECTROLYTES -follow labs as needed -replace as needed -pharmacy consultation   DVT/GI PRX ordered -SCDs  TRANSFUSIONS AS NEEDED MONITOR FSBS ASSESS the need for LABS as needed   Critical care  provider statement:    Critical care time (minutes):  33   Critical care time was exclusive of:  Separately billable procedures and treating other patients   Critical care was  necessary to treat or prevent imminent or life-threatening deterioration of the following conditions:  NSTEMi, AFrvr, advanced age with comorbid conditions   Critical care was time spent personally by me on the following activities:  Development of treatment plan with patient or surrogate, discussions with consultants, evaluation of patient's response to treatment, examination of patient, obtaining history from patient or surrogate, ordering and performing treatments and interventions, ordering and review of laboratory studies and re-evaluation of patient's condition.  I assumed direction of critical care for this patient from another provider in my specialty: no    This document was prepared using Dragon voice recognition software and may include unintentional dictation errors.    Ottie Glazier, M.D.  Division of [place]

## 2019-08-17 NOTE — Progress Notes (Signed)
Patients HR 55-60 in SR. Cardiology contacted and verbal order by Dr Waldon Reining to D/c Amiodarone GTTs and transition patient to 200 mg of Amiodarone BID.

## 2019-08-17 NOTE — Progress Notes (Signed)
Progress Note  Patient Name: Cheryl Briggs Date of Encounter: 08/17/2019  [facility] Cardiologist: No primary care provider on file.   Subjective   Feeling well.  No recurrent chest pain.  Breathing stable. Denies palpitations.   Inpatient Medications    Scheduled Meds: . amiodarone  200 mg Oral BID  . aspirin EC  81 mg Oral Daily  . atorvastatin  80 mg Oral Daily  . calcium-vitamin D  1 tablet Oral BID WC  . Chlorhexidine Gluconate Cloth  6 each Topical Daily  . cholecalciferol  3,000 Units Oral Daily  . heparin  5,000 Units Subcutaneous Q8H  . metoCLOPramide (REGLAN) injection  5 mg Intravenous Q8H  . metoprolol tartrate  25 mg Oral BID  . multivitamin with minerals  1 tablet Oral Daily  . mupirocin ointment  1 application Nasal BID  . pantoprazole  40 mg Oral Daily  . sodium chloride flush  3 mL Intravenous Once  . sodium chloride flush  3 mL Intravenous Q12H  . [START ON 08/18/2019] sodium chloride flush  3 mL Intravenous Q12H  . ticagrelor  90 mg Oral BID   Continuous Infusions: . [START ON 08/18/2019] sodium chloride    . [START ON 08/19/2019] sodium chloride     Followed by  . [START ON 08/19/2019] sodium chloride    . nitroGLYCERIN Stopped (08/16/19 1404)   PRN Meds: [START ON 08/18/2019] sodium chloride, acetaminophen, nitroGLYCERIN, ondansetron (ZOFRAN) IV, [START ON 08/18/2019] sodium chloride flush   Vital Signs    Vitals:   08/17/19 0900 08/17/19 0931 08/17/19 1000 08/17/19 1100  BP: (!) 140/58 (!) 140/58 (!) 140/58 135/64  Pulse: 87 95 86 79  Resp: (!) 21  (!) 22 (!) 24  Temp:      TempSrc:      SpO2: 96%  94% 93%  Weight:      Height:        Intake/Output Summary (Last 24 hours) at 08/17/2019 1412 Last data filed at 08/17/2019 0400 Gross per 24 hour  Intake 202.28 ml  Output 1350 ml  Net -1147.72 ml   Last 3 Weights 08/16/2019 08/15/2019 08/15/2019  Weight (lbs) 153 lb 152 lb 14.4 oz 158 lb 1.1 oz  Weight (kg) 69.4 kg 69.355 kg 71.7 kg       Telemetry    Sinus rhythm.  No events  - Personally Reviewed  ECG    08/16/2019: Atrial fibrillation.  Rate 107 bpm.  Inferior ST elevation. - Personally Reviewed  Physical Exam   VS:  BP 135/64   Pulse 79   Temp 98.1 F (36.7 C)   Resp (!) 24   Ht 5\' 2"  (1.575 m)   Wt 69.4 kg   SpO2 93%   BMI 27.98 kg/m  , BMI Body mass index is 27.98 kg/m. GENERAL:  Well appearing HEENT: Pupils equal round and reactive, fundi not visualized, oral mucosa unremarkable NECK:  No jugular venous distention, waveform within normal limits, carotid upstroke brisk and symmetric, no bruits LUNGS:  Clear to auscultation bilaterally HEART:  RRR.  PMI not displaced or sustained,S1 and S2 within normal limits, no S3, no S4, no clicks, no rubs, mo murmurs ABD:  Flat, positive bowel sounds normal in frequency in pitch, no bruits, no rebound, no guarding, no midline pulsatile mass, no hepatomegaly, no splenomegaly EXT:  2 plus pulses throughout, no edema, no cyanosis no clubbing.  Right wrist cath site clean, dry, and intact. SKIN:  No rashes no nodules NEURO:  Cranial nerves II through XII grossly intact, motor grossly intact throughout [facility]:  Cognitively intact, oriented to person place and time]  Labs    High Sensitivity Troponin:   Recent Labs  Lab 08/15/19 1024 08/15/19 1241 08/15/19 1656 08/15/19 2014  TROPONINIHS 171* 206* 341* 232*      Chemistry Recent Labs  Lab 08/16/19 0508 08/16/19 1756 08/17/19 0732  NA 140 137 138  K 4.7 3.8 3.5  CL 106 104 104  CO2 25 24 22   GLUCOSE 111* 168* 116*  BUN 8 7* 6*  CREATININE 0.71 0.70 0.80  CALCIUM 9.0 8.9 8.8*  GFRNONAA >60 >60 >60  GFRAA >60 >60 >60  ANIONGAP 9 9 12      Hematology Recent Labs  Lab 08/16/19 0508 08/16/19 1756 08/17/19 0732  WBC 16.8* 19.5* 18.1*  RBC 4.29 4.39 4.29  HGB 14.2 14.5 13.9  HCT 40.8 41.3 42.3  MCV 95.1 94.1 98.6  MCH 33.1 33.0 32.4  MCHC 34.8 35.1 32.9  RDW 13.2 13.2 13.2  PLT 318 306 298     BNPNo results for input(s): BNP, PROBNP in the last 168 hours.   DDimer No results for input(s): DDIMER in the last 168 hours.   Radiology    CARDIAC CATHETERIZATION  Result Date: 08/16/2019  A drug-eluting stent was successfully placed using a STENT SYNERGY DES 2.25X20.  A stent was successfully placed.  Conclusions: 1. 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 2. Moderately elevated left ventricular filling pressure. 3. Complex PCI to distal RCA, rPDA, and rPLA using Synergy 2.25 x 20 mm drug-eluting stent extending from the distal RCA into the rPDA, as well as angioplasty of the ostial rPL branch.  Intervention was complicated by no-reflow with transient hypotension/bradycardia and subsequent development of atrial fibrillation with rapid ventricular response.  Final angiogram shows 0% residual stenosis in the distal RCA/rPDA and 10% residual stenosis at ostial rPL with TIMI-2 flow. Recommendations: 1. Transfer to ICU for aggressive medical therapy and post-PCI monitoring. 2. Continue tirofiban infusion for 18 hours. 3. Dual antiplatelet therapy with aspirin and ticagrelor for at least 12 months. 4. Titrate nitroglycerine infusion for relief of chest pain. 5. Continue amiodarone infusion for rate control and hopefully conversion to sinus rhythm.  If patient remains in atrial fibrillation tomorrow, IV heparin should be started (will defer heparin at this time given tirofiban infusion). 6. Aggressive secondary prevention. 7. Anticipate relook catheterization on Monday with PCI to the LCx.  I favor medical therapy for D2, given relatively small vessel size and need to stent back to the LAD, were intervention undertaken. Nelva Bush, MD [facility]   Cardiac Studies   Echo 08/17/19: IMPRESSIONS     1. Left ventricular ejection fraction, by estimation, is 60 to 65%. The  left ventricle has normal function. The left ventricle has no regional  wall motion abnormalities. There is mild concentric left ventricular  hypertrophy. Left ventricular diastolic  parameters are consistent with Grade I diastolic dysfunction (impaired  relaxation). Elevated left ventricular end-diastolic pressure.  2. Right ventricular systolic function is normal. The right ventricular  size is normal.  3. The mitral valve is normal in structure. Trivial mitral valve  regurgitation. No evidence of mitral stenosis.  4. The aortic valve is tricuspid. Aortic valve regurgitation is not  visualized. No aortic stenosis is present.  LHC 08/16/19: Conclusions: 1. 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 2. Moderately elevated left ventricular filling pressure. 3. Complex PCI to distal RCA, rPDA, and rPLA using Synergy 2.25 x 20 mm drug-eluting stent extending from the distal RCA into the rPDA, as well as angioplasty of the ostial rPL branch.  Intervention was complicated by no-reflow with transient hypotension/bradycardia and subsequent development of atrial fibrillation with rapid ventricular response.  Final angiogram shows 0% residual stenosis in the distal RCA/rPDA and 10% residual stenosis at ostial rPL with TIMI-2 flow.  Recommendations: 1. Transfer to ICU for aggressive medical therapy and post-PCI monitoring. 2. Continue tirofiban infusion for 18 hours. 3. Dual antiplatelet therapy with aspirin and ticagrelor for at least 12 months. 4. Titrate nitroglycerine infusion for relief of chest pain. 5. Continue amiodarone infusion for rate control and hopefully conversion to sinus rhythm.  If patient remains in atrial  fibrillation tomorrow, IV heparin should be started (will defer heparin at this time given tirofiban infusion). 6. Aggressive secondary prevention. 7. Anticipate relook catheterization on Monday with PCI to the LCx.  I favor medical therapy for D2, given relatively small vessel size and need to stent back to the LAD, were intervention undertaken.   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  She was found to have multivessel disease at cath and underwent PCI to the distal RCA/PDA and RPL.  Assessment & Plan    # NSTEMI: # Pure hypercholesterolemia:  Ms. Arseneau was found to have multivessel CAD on cath.  Patient underwent PCI to the distal RCA/RPDA, and RPL.  A drug-eluting stent was placed from the distal RCA into the RPDA.  She had angioplasty of an ostial RPL branch.  This was complicated by no-reflow after post stent dilatation.  She developed hypotension, bradycardia, and atrial fibrillation with rapid ventricular response.  She was started on amiodarone and tirofiban.  Metoprolol has been started as well.  Ultimately her angiogram revealed 0% rersidual stenosis in the distal RCA/RPDA and 10% in the ostial RPL.  Tirofiban has been discontinued.  She will go for left circumflex PCI on Monday.  Per Dr. And planning for medical management of her 80% D2 lesion.  She is now feeling much better and has no recurrent chest pain.  Echo today showed preserved systolic function without any wall normal motion abnormalities.  LDL was 82 this admission.  Simvastatin was switched to atorvastatin 80 mg.  She will need repeat lipids and a CMP in 6 to 8 weeks.  Plan to continue aspirin and ticagrelor for 12 months.  # PAF:   Ms. Hackbart developed paroxysmal atrial fibrillation with rapid ventricular response.  She became hypotensive and bradycardic.  She was initially on IV amiodarone and this was transitioned to oral today.  Would continue this  at least through her heart cath on Monday.  No plan for oral anticoagulation at this time as this was her first episode and occurred in the setting of no reflow at her cath.       For questions or updates, please contact [place] Please consult [email or web address] for contact info under        Signed, Skeet Latch, MD  08/17/2019, 2:12 PM

## 2019-08-17 NOTE — Progress Notes (Signed)
Progress Note    Cheryl Briggs  JME:[id number] DOB: 04/16/1945  DOA: 08/15/2019 PCP: Joyice Faster, FNP      Brief Narrative:    Medical records reviewed and are as summarized below:  Cheryl Briggs is a 74 y.o. female with medical history significant for hyperlipidemia, CLL, who presented to the hospital with midsternal chest pain.  Her troponins were elevated and she was admitted to the hospital for acute NSTEMI.  She was seen in consultation by the cardiologist and she underwent left heart catheterization. "  Complex PCI to distal RCA, rPDA and rPLA with drug-eluting stent was performed.  During PCI, she developed transient bradycardia and hypotension and subsequently developed tachycardia and hypotension after receiving atropine and norepinephrine. She then developed atrial fibrillation with RVR requiring IV amiodarone infusion.   Assessment/Plan:   Principal Problem:   NSTEMI (non-ST elevated myocardial infarction) ([place]) Active Problems:   CLL (chronic lymphocytic leukemia) (HCC)   Atrial fibrillation with RVR (HCC)   Acute NSTEMI s/p PCI with placement of drug-eluting stent: Continue aspirin, Brilinta, troponin infusion, Lipitor and metoprolol.  Plan for repeat left heart cath on Monday, 08/19/2019  Atrial fibrillation with RVR: Converted to normal sinus rhythm.  IV amiodarone has been switched to oral amiodarone.    Acute diastolic heart failure: Resolved.  She was given IV Lasix 40 mg x 1 dose during PCI.  Per cardiologist, LVEDP  was moderately elevated at the time of catheterization.  2D echo showed EF estimated at 55 to 60%, mild LVH.  Hyperlipidemia: Continue Lipitor  Hypotension and bradycardia during PCI : Resolved.  She required IV atropine and norepinephrine.    CLL/leukocytosis: Outpatient follow-up with oncologist.  Transfer from stepdown unit to progressive care unit   Body mass index is 27.98 kg/m.  Diet Order            Diet NPO time  specified Except for: Sips with Meds  Diet effective midnight           Diet Heart Room service appropriate? Yes; Fluid consistency: Thin  Diet effective now                       Medications:   . amiodarone  200 mg Oral BID  . aspirin EC  81 mg Oral Daily  . atorvastatin  80 mg Oral Daily  . calcium-vitamin D  1 tablet Oral BID WC  . Chlorhexidine Gluconate Cloth  6 each Topical Daily  . cholecalciferol  3,000 Units Oral Daily  . heparin  5,000 Units Subcutaneous Q8H  . metoCLOPramide (REGLAN) injection  5 mg Intravenous Q8H  . metoprolol tartrate  25 mg Oral BID  . multivitamin with minerals  1 tablet Oral Daily  . mupirocin ointment  1 application Nasal BID  . pantoprazole  40 mg Oral Daily  . sodium chloride flush  3 mL Intravenous Once  . sodium chloride flush  3 mL Intravenous Q12H  . [START ON 08/18/2019] sodium chloride flush  3 mL Intravenous Q12H  . ticagrelor  90 mg Oral BID   Continuous Infusions: . [START ON 08/18/2019] sodium chloride    . [START ON 08/19/2019] sodium chloride     Followed by  . [START ON 08/19/2019] sodium chloride    . nitroGLYCERIN Stopped (08/16/19 1404)     Anti-infectives (From admission, onward)   Start     Dose/Rate Route Frequency Ordered Stop   08/15/19 1600  cefTRIAXone (ROCEPHIN) 2  g in sodium chloride 0.9 % 100 mL IVPB  Status:  Discontinued        2 g 200 mL/hr over 30 Minutes Intravenous Every 24 hours 08/15/19 1557 08/15/19 1558             Family Communication/Anticipated D/C date and plan/Code Status   DVT prophylaxis: heparin injection 5,000 Units Start: 08/16/19 2200     Code Status: Full Code  Family Communication: Plan discussed with her daughter, [name], at the bedside Disposition Plan:    Status is: Inpatient  Remains inpatient appropriate because:IV treatments appropriate due to intensity of illness or inability to take PO and Inpatient level of care appropriate due to severity of  illness   Dispo: The patient is from: Home              Anticipated d/c is to: Home              Anticipated d/c date is: 3 days              Patient currently is not medically stable to d/c.           Subjective:   No complaints.  No chest pain or shortness of breath.  Objective:    Vitals:   08/17/19 0900 08/17/19 0931 08/17/19 1000 08/17/19 1100  BP: (!) 140/58 (!) 140/58 (!) 140/58 135/64  Pulse: 87 95 86 79  Resp: (!) 21  (!) 22 (!) 24  Temp:      TempSrc:      SpO2: 96%  94% 93%  Weight:      Height:       No data found.   Intake/Output Summary (Last 24 hours) at 08/17/2019 1420 Last data filed at 08/17/2019 0400 Gross per 24 hour  Intake 202.28 ml  Output 1350 ml  Net -1147.72 ml   Filed Weights   08/15/19 1022 08/15/19 1705 08/16/19 0824  Weight: 71.7 kg 69.4 kg 69.4 kg    Exam:  GEN: NAD SKIN: Warm and dry.  No rash. EYES: EOMI ENT: MMM CV: RRR PULM: No wheezing or rales heard ABD: soft, ND, NT, +BS CNS: AAO x 3, non focal EXT: No edema or tenderness   Data Reviewed:   I have personally reviewed following labs and imaging studies:  Labs: Labs show the following:   Basic Metabolic Panel: Recent Labs  Lab 08/15/19 1024 08/15/19 1024 08/16/19 0508 08/16/19 0508 08/16/19 1756 08/17/19 0732 08/17/19 0741  NA 136  --  140  --  137 138  --   K 4.4   < > 4.7   < > 3.8 3.5  --   CL 101  --  106  --  104 104  --   CO2 25  --  25  --  24 22  --   GLUCOSE 132*  --  111*  --  168* 116*  --   BUN 7*  --  8  --  7* 6*  --   CREATININE 0.89  --  0.71  --  0.70 0.80  --   CALCIUM 9.7  --  9.0  --  8.9 8.8*  --   MG  --   --   --   --   --   --  2.1   < > = values in this interval not displayed.   GFR Estimated Creatinine Clearance: 55.4 mL/min (by C-G formula based on SCr of 0.8 mg/dL). Liver Function Tests: No results  for input(s): AST, ALT, ALKPHOS, BILITOT, PROT, ALBUMIN in the last 168 hours. No results for input(s): LIPASE,  AMYLASE in the last 168 hours. No results for input(s): AMMONIA in the last 168 hours. Coagulation profile Recent Labs  Lab 08/15/19 1311  INR 1.0    CBC: Recent Labs  Lab 08/15/19 1024 08/16/19 0508 08/16/19 1756 08/17/19 0732  WBC 18.4* 16.8* 19.5* 18.1*  HGB 15.3* 14.2 14.5 13.9  HCT 45.9 40.8 41.3 42.3  MCV 97.2 95.1 94.1 98.6  PLT 339 318 306 298   Cardiac Enzymes: No results for input(s): CKTOTAL, CKMB, CKMBINDEX, TROPONINI in the last 168 hours. BNP (last 3 results) No results for input(s): PROBNP in the last 8760 hours. CBG: No results for input(s): GLUCAP in the last 168 hours. D-Dimer: No results for input(s): DDIMER in the last 72 hours. Hgb A1c: Recent Labs    08/15/19 1024  HGBA1C 6.0*   Lipid Profile: Recent Labs    08/15/19 1024  CHOL 181  HDL 66  LDLCALC 82  TRIG 164*  CHOLHDL 2.7   Thyroid function studies: Recent Labs    08/16/19 0508  TSH 2.048   Anemia work up: No results for input(s): VITAMINB12, FOLATE, FERRITIN, TIBC, IRON, RETICCTPCT in the last 72 hours. Sepsis Labs: Recent Labs  Lab 08/15/19 1024 08/16/19 0508 08/16/19 1756 08/17/19 0732  WBC 18.4* 16.8* 19.5* 18.1*    Microbiology Recent Results (from the past 240 hour(s))  SARS Coronavirus 2 by RT PCR (hospital order, performed in [facility] hospital lab) Nasopharyngeal Nasopharyngeal Swab     Status: None   Collection Time: 08/15/19 12:41 PM   Specimen: Nasopharyngeal Swab  Result Value Ref Range Status   SARS Coronavirus 2 NEGATIVE NEGATIVE Final    Comment: (NOTE) SARS-CoV-2 target nucleic acids are NOT DETECTED.  The SARS-CoV-2 RNA is generally detectable in upper and lower respiratory specimens during the acute phase of infection. The lowest concentration of SARS-CoV-2 viral copies this assay can detect is 250 copies / mL. A negative result does not preclude SARS-CoV-2 infection and should not be used as the sole basis for treatment or other patient  management decisions.  A negative result may occur with improper specimen collection / handling, submission of specimen other than nasopharyngeal swab, presence of viral mutation(s) within the areas targeted by this assay, and inadequate number of viral copies (<250 copies / mL). A negative result must be combined with clinical observations, patient history, and epidemiological information.  Fact Sheet for Patients:   StrictlyIdeas.no  Fact Sheet for Healthcare Providers: BankingDealers.co.za  This test is not yet approved or  cleared by the [place] FDA and has been authorized for detection and/or diagnosis of SARS-CoV-2 by FDA under an Emergency Use Authorization (EUA).  This EUA will remain in effect (meaning this test can be used) for the duration of the COVID-19 declaration under Section 564(b)(1) of the Act, 21 U.S.C. section 360bbb-3(b)(1), unless the authorization is terminated or revoked sooner.  Performed at [facility], [address] [place], [place] [address]     Procedures and diagnostic studies:  CARDIAC CATHETERIZATION  Result Date: 08/16/2019  A drug-eluting stent was successfully placed using a STENT SYNERGY DES 2.25X20.  A stent was successfully placed.  Conclusions: 1. 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 2. Moderately elevated  left ventricular filling pressure. 3. Complex PCI to distal RCA, rPDA, and rPLA using Synergy 2.25 x 20 mm drug-eluting stent extending from the distal RCA into the rPDA, as well as angioplasty of the ostial rPL branch.  Intervention was complicated by no-reflow with transient hypotension/bradycardia and subsequent development of atrial fibrillation with rapid ventricular  response.  Final angiogram shows 0% residual stenosis in the distal RCA/rPDA and 10% residual stenosis at ostial rPL with TIMI-2 flow. Recommendations: 1. Transfer to ICU for aggressive medical therapy and post-PCI monitoring. 2. Continue tirofiban infusion for 18 hours. 3. Dual antiplatelet therapy with aspirin and ticagrelor for at least 12 months. 4. Titrate nitroglycerine infusion for relief of chest pain. 5. Continue amiodarone infusion for rate control and hopefully conversion to sinus rhythm.  If patient remains in atrial fibrillation tomorrow, IV heparin should be started (will defer heparin at this time given tirofiban infusion). 6. Aggressive secondary prevention. 7. Anticipate relook catheterization on Monday with PCI to the LCx.  I favor medical therapy for D2, given relatively small vessel size and need to stent back to the LAD, were intervention undertaken. Nelva Bush, MD [place]              LOS: 1 day   [facility]   Pager [phone]. If 7PM-7AM, please contact night-coverage at [email or web address]     08/17/2019, 2:20 PM

## 2019-08-17 NOTE — Progress Notes (Signed)
*  PRELIMINARY RESULTS* Echocardiogram 2D Echocardiogram has been performed.  Cheryl Briggs 08/17/2019, 12:02 PM

## 2019-08-18 DIAGNOSIS — E7800 Pure hypercholesterolemia, unspecified: Secondary | ICD-10-CM

## 2019-08-18 LAB — SURGICAL PCR SCREEN
MRSA, PCR: NEGATIVE
Staphylococcus aureus: NEGATIVE

## 2019-08-18 NOTE — H&P (View-Only) (Signed)
Progress Note  Patient Name: Cheryl Briggs Date of Encounter: 08/18/2019  [place] Cardiologist: No primary care provider on file.   Subjective   Feeling well.  No recurrent chest pain.  Breathing stable. Denies palpitations.   Inpatient Medications    Scheduled Meds: . amiodarone  200 mg Oral BID  . aspirin EC  81 mg Oral Daily  . atorvastatin  80 mg Oral Daily  . calcium-vitamin D  1 tablet Oral BID WC  . Chlorhexidine Gluconate Cloth  6 each Topical Daily  . cholecalciferol  3,000 Units Oral Daily  . heparin  5,000 Units Subcutaneous Q8H  . metoCLOPramide (REGLAN) injection  5 mg Intravenous Q8H  . metoprolol tartrate  25 mg Oral BID  . multivitamin with minerals  1 tablet Oral Daily  . mupirocin ointment  1 application Nasal BID  . pantoprazole  40 mg Oral Daily  . sodium chloride flush  3 mL Intravenous Once  . sodium chloride flush  3 mL Intravenous Q12H  . sodium chloride flush  3 mL Intravenous Q12H  . ticagrelor  90 mg Oral BID   Continuous Infusions: . sodium chloride    . [START ON 08/19/2019] sodium chloride     Followed by  . [START ON 08/19/2019] sodium chloride     PRN Meds: sodium chloride, acetaminophen, nitroGLYCERIN, ondansetron (ZOFRAN) IV, sodium chloride flush   Vital Signs    Vitals:   08/17/19 2121 08/18/19 0419 08/18/19 0828 08/18/19 1118  BP: 135/65 127/65 139/67 111/70  Pulse: 84 86 92 80  Resp: 20 20 18 17   Temp: 98.2 F (36.8 C) 98.6 F (37 C) 97.6 F (36.4 C) 98.1 F (36.7 C)  TempSrc: Oral Oral Oral Oral  SpO2: 95% 96% 93% 93%  Weight: 69.7 kg 68.5 kg    Height: 5\' 3"  (1.6 m)       Intake/Output Summary (Last 24 hours) at 08/18/2019 1257 Last data filed at 08/18/2019 9147 Gross per 24 hour  Intake 240 ml  Output 450 ml  Net -210 ml   Last 3 Weights 08/18/2019 08/17/2019 08/16/2019  Weight (lbs) 151 lb 153 lb 9.6 oz 153 lb  Weight (kg) 68.493 kg 69.673 kg 69.4 kg      Telemetry    Sinus rhythm, sinus tachycardia.   PVCs.  No events  - Personally Reviewed  ECG    08/16/2019: Atrial fibrillation.  Rate 107 bpm.  Inferior ST elevation. - Personally Reviewed  Physical Exam   VS:  BP 111/70 (BP Location: Left Arm)   Pulse 80   Temp 98.1 F (36.7 C) (Oral)   Resp 17   Ht 5\' 3"  (1.6 m)   Wt 68.5 kg   SpO2 93%   BMI 26.75 kg/m  , BMI Body mass index is 26.75 kg/m. GENERAL:  Well appearing HEENT: Pupils equal round and reactive, fundi not visualized, oral mucosa unremarkable NECK:  No jugular venous distention, waveform within normal limits, carotid upstroke brisk and symmetric, no bruits LUNGS:  Clear to auscultation bilaterally HEART:  RRR.  PMI not displaced or sustained,S1 and S2 within normal limits, no S3, no S4, no clicks, no rubs, mo murmurs ABD:  Flat, positive bowel sounds normal in frequency in pitch, no bruits, no rebound, no guarding, no midline pulsatile mass, no hepatomegaly, no splenomegaly EXT:  2 plus pulses throughout, no edema, no cyanosis no clubbing.  Right wrist cath site clean, dry, and intact. SKIN:  No rashes no nodules NEURO:  Cranial  nerves II through XII grossly intact, motor grossly intact throughout [facility]:  Cognitively intact, oriented to person place and time]  Labs    High Sensitivity Troponin:   Recent Labs  Lab 08/15/19 1024 08/15/19 1241 08/15/19 1656 08/15/19 2014  TROPONINIHS 171* 206* 341* 232*      Chemistry Recent Labs  Lab 08/16/19 0508 08/16/19 1756 08/17/19 0732  NA 140 137 138  K 4.7 3.8 3.5  CL 106 104 104  CO2 25 24 22   GLUCOSE 111* 168* 116*  BUN 8 7* 6*  CREATININE 0.71 0.70 0.80  CALCIUM 9.0 8.9 8.8*  GFRNONAA >60 >60 >60  GFRAA >60 >60 >60  ANIONGAP 9 9 12      Hematology Recent Labs  Lab 08/16/19 0508 08/16/19 1756 08/17/19 0732  WBC 16.8* 19.5* 18.1*  RBC 4.29 4.39 4.29  HGB 14.2 14.5 13.9  HCT 40.8 41.3 42.3  MCV 95.1 94.1 98.6  MCH 33.1 33.0 32.4  MCHC 34.8 35.1 32.9  RDW 13.2 13.2 13.2  PLT 318 306 298     BNPNo results for input(s): BNP, PROBNP in the last 168 hours.   DDimer No results for input(s): DDIMER in the last 168 hours.   Radiology    ECHOCARDIOGRAM LIMITED  Result Date: 08/17/2019    ECHOCARDIOGRAM LIMITED REPORT   Patient Name:   JERIS EASTERLY Date of Exam: 08/17/2019 Medical Rec #:  [id number]         Height:       62.0 in Accession #:    [id number]        Weight:       153.0 lb Date of Birth:  Jun 29, 1945         BSA:          1.706 m Patient Age:    36 years          BP:           140/58 mmHg Patient Gender: F                 HR:           95 bpm. Exam Location:  [facility] Procedure: 2D Echo, Limited Echo, Cardiac Doppler and Color Doppler Indications:     Acute myocardial infarction 410  History:         Patient has prior history of Echocardiogram examinations.  Sonographer:     Alyse Low Roar Referring Phys:  [place] Diagnosing Phys: Skeet Latch MD IMPRESSIONS  1. Left ventricular ejection fraction, by estimation, is 60 to 65%. The left ventricle has normal function. The left ventricle has no regional wall motion abnormalities. There is mild concentric left ventricular hypertrophy. Left ventricular diastolic parameters are consistent with Grade I diastolic dysfunction (impaired relaxation). Elevated left ventricular end-diastolic pressure.  2. Right ventricular systolic function is normal. The right ventricular size is normal.  3. The mitral valve is normal in structure. Trivial mitral valve regurgitation. No evidence of mitral stenosis.  4. The aortic valve is tricuspid. Aortic valve regurgitation is not visualized. No aortic stenosis is present. FINDINGS  Left Ventricle: Left ventricular ejection fraction, by estimation, is 60 to 65%. The left ventricle has normal function. The left ventricle has no regional wall motion abnormalities. The left ventricular internal cavity size was normal in size. There is  mild concentric left ventricular hypertrophy. Elevated left  ventricular end-diastolic pressure. Right Ventricle: The right ventricular size is normal. No increase in right ventricular wall thickness. Right ventricular systolic  function is normal. Left Atrium: Left atrial size was normal in size. Right Atrium: Right atrial size was normal in size. Pericardium: Trivial pericardial effusion is present. Mitral Valve: The mitral valve is normal in structure. Normal mobility of the mitral valve leaflets. Trivial mitral valve regurgitation. No evidence of mitral valve stenosis. Tricuspid Valve: The tricuspid valve is normal in structure. Tricuspid valve regurgitation is trivial. No evidence of tricuspid stenosis. Aortic Valve: The aortic valve is tricuspid. Aortic valve regurgitation is not visualized. No aortic stenosis is present. Aortic valve peak gradient measures 13.1 mmHg. Pulmonic Valve: The pulmonic valve was normal in structure. Pulmonic valve regurgitation is not visualized. No evidence of pulmonic stenosis. Aorta: The aortic root is normal in size and structure. Venous: The inferior vena cava was not well visualized. IAS/Shunts: No atrial level shunt detected by color flow Doppler. LEFT VENTRICLE PLAX 2D LVIDd:         3.64 cm  Diastology LVIDs:         2.46 cm  LV e' lateral:   7.51 cm/s LV PW:         1.04 cm  LV E/e' lateral: 11.8 LV IVS:        1.21 cm  LV e' medial:    4.35 cm/s LVOT diam:     1.70 cm  LV E/e' medial:  20.4 LVOT Area:     2.27 cm  RIGHT VENTRICLE RV Mid diam:    2.95 cm RV S prime:     14.90 cm/s TAPSE (M-mode): 2.0 cm LEFT ATRIUM             Index       RIGHT ATRIUM          Index LA diam:        3.50 cm 2.05 cm/m  RA Area:     9.52 cm LA Vol (A2C):   32.5 ml 19.05 ml/m RA Volume:   19.70 ml 11.55 ml/m LA Vol (A4C):   26.2 ml 15.36 ml/m LA Biplane Vol: 29.1 ml 17.06 ml/m  AORTIC VALVE                PULMONIC VALVE AV Area (Vmax): 1.30 cm    PV Vmax:        1.07 m/s AV Vmax:        181.00 cm/s PV Peak grad:   4.6 mmHg AV Peak Grad:   13.1  mmHg   RVOT Peak grad: 2 mmHg LVOT Vmax:      104.00 cm/s  AORTA Ao Root diam: 2.50 cm MITRAL VALVE                TRICUSPID VALVE MV Area (PHT): 4.21 cm     TR Peak grad:   29.2 mmHg MV Decel Time: 180 msec     TR Vmax:        270.00 cm/s MV E velocity: 88.80 cm/s MV A velocity: 108.00 cm/s  SHUNTS MV E/A ratio:  0.82         Systemic Diam: 1.70 cm MV A Prime:    11.9 cm/s Skeet Latch MD Electronically signed by Skeet Latch MD Signature Date/Time: 08/17/2019/2:21:07 PM    Final     Cardiac Studies   Echo 08/17/19: IMPRESSIONS    1. Left ventricular ejection fraction, by estimation, is 60 to 65%. The  left ventricle has normal function. The left ventricle has no regional  wall motion abnormalities. There is mild concentric left ventricular  hypertrophy.  Left ventricular diastolic  parameters are consistent with Grade I diastolic dysfunction (impaired  relaxation). Elevated left ventricular end-diastolic pressure.  2. Right ventricular systolic function is normal. The right ventricular  size is normal.  3. The mitral valve is normal in structure. Trivial mitral valve  regurgitation. No evidence of mitral stenosis.  4. The aortic valve is tricuspid. Aortic valve regurgitation is not  visualized. No aortic stenosis is present.   LHC 08/16/19: Conclusions: 1. 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 2. Moderately elevated left ventricular filling pressure. 3. Complex PCI to distal RCA, rPDA, and rPLA using Synergy 2.25 x 20 mm drug-eluting stent extending from the distal RCA into the rPDA, as well as angioplasty of the ostial rPL branch.  Intervention was complicated by no-reflow with transient hypotension/bradycardia and subsequent development of atrial fibrillation with rapid ventricular  response.  Final angiogram shows 0% residual stenosis in the distal RCA/rPDA and 10% residual stenosis at ostial rPL with TIMI-2 flow.  Recommendations: 1. Transfer to ICU for aggressive medical therapy and post-PCI monitoring. 2. Continue tirofiban infusion for 18 hours. 3. Dual antiplatelet therapy with aspirin and ticagrelor for at least 12 months. 4. Titrate nitroglycerine infusion for relief of chest pain. 5. Continue amiodarone infusion for rate control and hopefully conversion to sinus rhythm.  If patient remains in atrial fibrillation tomorrow, IV heparin should be started (will defer heparin at this time given tirofiban infusion). 6. Aggressive secondary prevention. 7. Anticipate relook catheterization on Monday with PCI to the LCx.  I favor medical therapy for D2, given relatively small vessel size and need to stent back to the LAD, were intervention undertaken.   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  She was found to have multivessel disease at cath and underwent PCI to the distal RCA/PDA and RPL.  Assessment & Plan    # NSTEMI: # Pure hypercholesterolemia:  Ms. Szczerba was found to have multivessel CAD on cath.  Patient underwent PCI to the distal RCA/RPDA, and RPL.  A drug-eluting stent was placed from the distal RCA into the RPDA.  She had angioplasty of an ostial RPL branch.  This was complicated by no-reflow after post stent dilatation.  She developed hypotension, bradycardia, and atrial fibrillation with rapid ventricular response.  She was started on amiodarone and tirofiban.  Metoprolol has been started as well.  Ultimately her angiogram revealed 0% rersidual stenosis in the distal RCA/RPDA and 10% in the ostial RPL.  Tirofiban has been discontinued.  She will go for left circumflex PCI on Monday.  Per Dr. Saunders Revel planning for medical management of her 80% D2 lesion.  She also has a chronically occluded OM  lesion.  She is now feeling much better and has no recurrent chest pain.  Echo today showed preserved systolic function without any wall normal motion abnormalities.  LDL was 82 this admission.  Simvastatin was switched to atorvastatin 80 mg.  She will need repeat lipids and a CMP in 6 to 8 weeks.  Plan to continue aspirin and ticagrelor for 12 months.  NPO after midnight for PCI tomorrow.  BMP in the AM.  Risks and benefits of cardiac catheterization have been discussed with the patient.  The patient understands that risks included but are not limited  to stroke (1 in 1000), death (1 in 44), kidney failure [usually temporary] (1 in 500), bleeding (1 in 200), allergic reaction [possibly serious] (1 in 200). The patient understands and agrees to proceed.   # PAF:  Ms. Nicodemus developed paroxysmal atrial fibrillation with rapid ventricular response.  She became hypotensive and bradycardic.  She was initially on IV amiodarone and this was transitioned to oral.  Would continue this at least through her heart cath on Monday.  No plan for oral anticoagulation at this time as this was her first episode and occurred in the setting of no reflow at her cath.  Continue metoprolol.       For questions or updates, please contact [place] Please consult [email or web address] for contact info under        Signed, Skeet Latch, MD  08/18/2019, 12:57 PM

## 2019-08-18 NOTE — Progress Notes (Signed)
Progress Note    Cheryl Briggs  IWL:[id number] DOB: 1945/07/12  DOA: 08/15/2019 PCP: Joyice Faster, FNP      Brief Narrative:    Medical records reviewed and are as summarized below:  Cheryl Briggs is a 74 y.o. female with medical history significant for hyperlipidemia, CLL, who presented to the hospital with midsternal chest pain.  Her troponins were elevated and she was admitted to the hospital for acute NSTEMI.  She was seen in consultation by the cardiologist and she underwent left heart catheterization. "  Complex PCI to distal RCA, rPDA and rPLA with drug-eluting stent was performed.  During PCI, she developed transient bradycardia and hypotension and subsequently developed tachycardia and hypotension after receiving atropine and norepinephrine. She then developed atrial fibrillation with RVR requiring IV amiodarone infusion.   Assessment/Plan:   Principal Problem:   NSTEMI (non-ST elevated myocardial infarction) ([place]) Active Problems:   CLL (chronic lymphocytic leukemia) (HCC)   Atrial fibrillation with RVR (HCC)   Acute NSTEMI s/p PCI with placement of drug-eluting stent: Continue aspirin, Brilinta, troponin infusion, Lipitor and metoprolol.  Plan for repeat left heart cath on Monday, 08/19/2019.  Monitor on progressive cardiac unit  Atrial fibrillation with RVR: Converted to normal sinus rhythm.  Continue amiodarone  Acute diastolic heart failure: Resolved.  She was given IV Lasix 40 mg x 1 dose during PCI.  Per cardiologist, LVEDP  was moderately elevated at the time of catheterization.  2D echo showed EF estimated at 55 to 60%, mild LVH.  Hyperlipidemia: Continue Lipitor  Hypotension and bradycardia during PCI : Resolved.  She required IV atropine and norepinephrine.    CLL/leukocytosis: Outpatient follow-up with oncologist.    Body mass index is 26.75 kg/m.  Diet Order            Diet NPO time specified Except for: Sips with Meds  Diet  effective midnight           Diet Heart Room service appropriate? Yes; Fluid consistency: Thin  Diet effective now                       Medications:   . amiodarone  200 mg Oral BID  . aspirin EC  81 mg Oral Daily  . atorvastatin  80 mg Oral Daily  . calcium-vitamin D  1 tablet Oral BID WC  . Chlorhexidine Gluconate Cloth  6 each Topical Daily  . cholecalciferol  3,000 Units Oral Daily  . heparin  5,000 Units Subcutaneous Q8H  . metoCLOPramide (REGLAN) injection  5 mg Intravenous Q8H  . metoprolol tartrate  25 mg Oral BID  . multivitamin with minerals  1 tablet Oral Daily  . mupirocin ointment  1 application Nasal BID  . pantoprazole  40 mg Oral Daily  . sodium chloride flush  3 mL Intravenous Once  . sodium chloride flush  3 mL Intravenous Q12H  . sodium chloride flush  3 mL Intravenous Q12H  . ticagrelor  90 mg Oral BID   Continuous Infusions: . sodium chloride    . [START ON 08/19/2019] sodium chloride     Followed by  . [START ON 08/19/2019] sodium chloride       Anti-infectives (From admission, onward)   Start     Dose/Rate Route Frequency Ordered Stop   08/15/19 1600  cefTRIAXone (ROCEPHIN) 2 g in sodium chloride 0.9 % 100 mL IVPB  Status:  Discontinued        2 g  200 mL/hr over 30 Minutes Intravenous Every 24 hours 08/15/19 1557 08/15/19 1558             Family Communication/Anticipated D/C date and plan/Code Status   DVT prophylaxis: heparin injection 5,000 Units Start: 08/16/19 2200     Code Status: Full Code  Family Communication: Plan discussed with her daughter, [name], at the bedside Disposition Plan:    Status is: Inpatient  Remains inpatient appropriate because:IV treatments appropriate due to intensity of illness or inability to take PO and Inpatient level of care appropriate due to severity of illness   Dispo: The patient is from: Home              Anticipated d/c is to: Home              Anticipated d/c date is: 3 days               Patient currently is not medically stable to d/c.           Subjective:   She feels okay.  No dizziness, palpitations, chest pain or shortness of breath.  Objective:    Vitals:   08/17/19 2121 08/18/19 0419 08/18/19 0828 08/18/19 1118  BP: 135/65 127/65 139/67 111/70  Pulse: 84 86 92 80  Resp: 20 20 18 17   Temp: 98.2 F (36.8 C) 98.6 F (37 C) 97.6 F (36.4 C) 98.1 F (36.7 C)  TempSrc: Oral Oral Oral Oral  SpO2: 95% 96% 93% 93%  Weight: 69.7 kg 68.5 kg    Height: 5\' 3"  (1.6 m)      No data found.   Intake/Output Summary (Last 24 hours) at 08/18/2019 1359 Last data filed at 08/18/2019 9476 Gross per 24 hour  Intake 240 ml  Output 450 ml  Net -210 ml   Filed Weights   08/16/19 0824 08/17/19 2121 08/18/19 0419  Weight: 69.4 kg 69.7 kg 68.5 kg    Exam:  GEN: No acute distress SKIN: Warm and dry.  No rash. EYES: Anicteric ENT: MMM CV: RRR PULM: Clear to auscultation bilaterally ABD: soft, ND, NT, +BS CNS: AAO x 3, non focal EXT: No edema or tenderness   Data Reviewed:   I have personally reviewed following labs and imaging studies:  Labs: Labs show the following:   Basic Metabolic Panel: Recent Labs  Lab 08/15/19 1024 08/15/19 1024 08/16/19 0508 08/16/19 0508 08/16/19 1756 08/17/19 0732 08/17/19 0741  NA 136  --  140  --  137 138  --   K 4.4   < > 4.7   < > 3.8 3.5  --   CL 101  --  106  --  104 104  --   CO2 25  --  25  --  24 22  --   GLUCOSE 132*  --  111*  --  168* 116*  --   BUN 7*  --  8  --  7* 6*  --   CREATININE 0.89  --  0.71  --  0.70 0.80  --   CALCIUM 9.7  --  9.0  --  8.9 8.8*  --   MG  --   --   --   --   --   --  2.1   < > = values in this interval not displayed.   GFR Estimated Creatinine Clearance: 56.4 mL/min (by C-G formula based on SCr of 0.8 mg/dL). Liver Function Tests: No results for input(s): AST, ALT, ALKPHOS, BILITOT, PROT,  ALBUMIN in the last 168 hours. No results for input(s): LIPASE, AMYLASE in  the last 168 hours. No results for input(s): AMMONIA in the last 168 hours. Coagulation profile Recent Labs  Lab 08/15/19 1311  INR 1.0    CBC: Recent Labs  Lab 08/15/19 1024 08/16/19 0508 08/16/19 1756 08/17/19 0732  WBC 18.4* 16.8* 19.5* 18.1*  HGB 15.3* 14.2 14.5 13.9  HCT 45.9 40.8 41.3 42.3  MCV 97.2 95.1 94.1 98.6  PLT 339 318 306 298   Cardiac Enzymes: No results for input(s): CKTOTAL, CKMB, CKMBINDEX, TROPONINI in the last 168 hours. BNP (last 3 results) No results for input(s): PROBNP in the last 8760 hours. CBG: No results for input(s): GLUCAP in the last 168 hours. D-Dimer: No results for input(s): DDIMER in the last 72 hours. Hgb A1c: No results for input(s): HGBA1C in the last 72 hours. Lipid Profile: No results for input(s): CHOL, HDL, LDLCALC, TRIG, CHOLHDL, LDLDIRECT in the last 72 hours. Thyroid function studies: Recent Labs    08/16/19 0508  TSH 2.048   Anemia work up: No results for input(s): VITAMINB12, FOLATE, FERRITIN, TIBC, IRON, RETICCTPCT in the last 72 hours. Sepsis Labs: Recent Labs  Lab 08/15/19 1024 08/16/19 0508 08/16/19 1756 08/17/19 0732  WBC 18.4* 16.8* 19.5* 18.1*    Microbiology Recent Results (from the past 240 hour(s))  SARS Coronavirus 2 by RT PCR (hospital order, performed in [facility] hospital lab) Nasopharyngeal Nasopharyngeal Swab     Status: None   Collection Time: 08/15/19 12:41 PM   Specimen: Nasopharyngeal Swab  Result Value Ref Range Status   SARS Coronavirus 2 NEGATIVE NEGATIVE Final    Comment: (NOTE) SARS-CoV-2 target nucleic acids are NOT DETECTED.  The SARS-CoV-2 RNA is generally detectable in upper and lower respiratory specimens during the acute phase of infection. The lowest concentration of SARS-CoV-2 viral copies this assay can detect is 250 copies / mL. A negative result does not preclude SARS-CoV-2 infection and should not be used as the sole basis for treatment or other patient management  decisions.  A negative result may occur with improper specimen collection / handling, submission of specimen other than nasopharyngeal swab, presence of viral mutation(s) within the areas targeted by this assay, and inadequate number of viral copies (<250 copies / mL). A negative result must be combined with clinical observations, patient history, and epidemiological information.  Fact Sheet for Patients:   StrictlyIdeas.no  Fact Sheet for Healthcare Providers: BankingDealers.co.za  This test is not yet approved or  cleared by the [place] FDA and has been authorized for detection and/or diagnosis of SARS-CoV-2 by FDA under an Emergency Use Authorization (EUA).  This EUA will remain in effect (meaning this test can be used) for the duration of the COVID-19 declaration under Section 564(b)(1) of the Act, 21 U.S.C. section 360bbb-3(b)(1), unless the authorization is terminated or revoked sooner.  Performed at [facility], [address], [place], [place] [address]     Procedures and diagnostic studies:  ECHOCARDIOGRAM LIMITED  Result Date: 08/17/2019    ECHOCARDIOGRAM LIMITED REPORT   Patient Name:   AMAND LEMOINE Date of Exam: 08/17/2019 Medical Rec #:  [id number]         Height:       62.0 in Accession #:    [id number]        Weight:       153.0 lb Date of Birth:  11-Oct-1945         BSA:  1.706 m Patient Age:    57 years          BP:           140/58 mmHg Patient Gender: F                 HR:           95 bpm. Exam Location:  [facility] Procedure: 2D Echo, Limited Echo, Cardiac Doppler and Color Doppler Indications:     Acute myocardial infarction 410  History:         Patient has prior history of Echocardiogram examinations.  Sonographer:     Alyse Low Roar Referring Phys:  [place] Diagnosing Phys: Skeet Latch MD IMPRESSIONS  1. Left ventricular ejection fraction, by estimation, is 60 to 65%. The left  ventricle has normal function. The left ventricle has no regional wall motion abnormalities. There is mild concentric left ventricular hypertrophy. Left ventricular diastolic parameters are consistent with Grade I diastolic dysfunction (impaired relaxation). Elevated left ventricular end-diastolic pressure.  2. Right ventricular systolic function is normal. The right ventricular size is normal.  3. The mitral valve is normal in structure. Trivial mitral valve regurgitation. No evidence of mitral stenosis.  4. The aortic valve is tricuspid. Aortic valve regurgitation is not visualized. No aortic stenosis is present. FINDINGS  Left Ventricle: Left ventricular ejection fraction, by estimation, is 60 to 65%. The left ventricle has normal function. The left ventricle has no regional wall motion abnormalities. The left ventricular internal cavity size was normal in size. There is  mild concentric left ventricular hypertrophy. Elevated left ventricular end-diastolic pressure. Right Ventricle: The right ventricular size is normal. No increase in right ventricular wall thickness. Right ventricular systolic function is normal. Left Atrium: Left atrial size was normal in size. Right Atrium: Right atrial size was normal in size. Pericardium: Trivial pericardial effusion is present. Mitral Valve: The mitral valve is normal in structure. Normal mobility of the mitral valve leaflets. Trivial mitral valve regurgitation. No evidence of mitral valve stenosis. Tricuspid Valve: The tricuspid valve is normal in structure. Tricuspid valve regurgitation is trivial. No evidence of tricuspid stenosis. Aortic Valve: The aortic valve is tricuspid. Aortic valve regurgitation is not visualized. No aortic stenosis is present. Aortic valve peak gradient measures 13.1 mmHg. Pulmonic Valve: The pulmonic valve was normal in structure. Pulmonic valve regurgitation is not visualized. No evidence of pulmonic stenosis. Aorta: The aortic root is normal  in size and structure. Venous: The inferior vena cava was not well visualized. IAS/Shunts: No atrial level shunt detected by color flow Doppler. LEFT VENTRICLE PLAX 2D LVIDd:         3.64 cm  Diastology LVIDs:         2.46 cm  LV e' lateral:   7.51 cm/s LV PW:         1.04 cm  LV E/e' lateral: 11.8 LV IVS:        1.21 cm  LV e' medial:    4.35 cm/s LVOT diam:     1.70 cm  LV E/e' medial:  20.4 LVOT Area:     2.27 cm  RIGHT VENTRICLE RV Mid diam:    2.95 cm RV S prime:     14.90 cm/s TAPSE (M-mode): 2.0 cm LEFT ATRIUM             Index       RIGHT ATRIUM          Index LA diam:  3.50 cm 2.05 cm/m  RA Area:     9.52 cm LA Vol (A2C):   32.5 ml 19.05 ml/m RA Volume:   19.70 ml 11.55 ml/m LA Vol (A4C):   26.2 ml 15.36 ml/m LA Biplane Vol: 29.1 ml 17.06 ml/m  AORTIC VALVE                PULMONIC VALVE AV Area (Vmax): 1.30 cm    PV Vmax:        1.07 m/s AV Vmax:        181.00 cm/s PV Peak grad:   4.6 mmHg AV Peak Grad:   13.1 mmHg   RVOT Peak grad: 2 mmHg LVOT Vmax:      104.00 cm/s  AORTA Ao Root diam: 2.50 cm MITRAL VALVE                TRICUSPID VALVE MV Area (PHT): 4.21 cm     TR Peak grad:   29.2 mmHg MV Decel Time: 180 msec     TR Vmax:        270.00 cm/s MV E velocity: 88.80 cm/s MV A velocity: 108.00 cm/s  SHUNTS MV E/A ratio:  0.82         Systemic Diam: 1.70 cm MV A Prime:    11.9 cm/s Skeet Latch MD Electronically signed by Skeet Latch MD Signature Date/Time: 08/17/2019/2:21:07 PM    Final                LOS: 2 days   [facility]   Pager [phone]. If 7PM-7AM, please contact night-coverage at [email or web address]     08/18/2019, 1:59 PM

## 2019-08-18 NOTE — Progress Notes (Signed)
Progress Note  Patient Name: Cheryl Briggs Date of Encounter: 08/18/2019  [place] Cardiologist: No primary care provider on file.   Subjective   Feeling well.  No recurrent chest pain.  Breathing stable. Denies palpitations.   Inpatient Medications    Scheduled Meds: . amiodarone  200 mg Oral BID  . aspirin EC  81 mg Oral Daily  . atorvastatin  80 mg Oral Daily  . calcium-vitamin D  1 tablet Oral BID WC  . Chlorhexidine Gluconate Cloth  6 each Topical Daily  . cholecalciferol  3,000 Units Oral Daily  . heparin  5,000 Units Subcutaneous Q8H  . metoCLOPramide (REGLAN) injection  5 mg Intravenous Q8H  . metoprolol tartrate  25 mg Oral BID  . multivitamin with minerals  1 tablet Oral Daily  . mupirocin ointment  1 application Nasal BID  . pantoprazole  40 mg Oral Daily  . sodium chloride flush  3 mL Intravenous Once  . sodium chloride flush  3 mL Intravenous Q12H  . sodium chloride flush  3 mL Intravenous Q12H  . ticagrelor  90 mg Oral BID   Continuous Infusions: . sodium chloride    . [START ON 08/19/2019] sodium chloride     Followed by  . [START ON 08/19/2019] sodium chloride     PRN Meds: sodium chloride, acetaminophen, nitroGLYCERIN, ondansetron (ZOFRAN) IV, sodium chloride flush   Vital Signs    Vitals:   08/17/19 2121 08/18/19 0419 08/18/19 0828 08/18/19 1118  BP: 135/65 127/65 139/67 111/70  Pulse: 84 86 92 80  Resp: 20 20 18 17   Temp: 98.2 F (36.8 C) 98.6 F (37 C) 97.6 F (36.4 C) 98.1 F (36.7 C)  TempSrc: Oral Oral Oral Oral  SpO2: 95% 96% 93% 93%  Weight: 69.7 kg 68.5 kg    Height: 5\' 3"  (1.6 m)       Intake/Output Summary (Last 24 hours) at 08/18/2019 1257 Last data filed at 08/18/2019 4540 Gross per 24 hour  Intake 240 ml  Output 450 ml  Net -210 ml   Last 3 Weights 08/18/2019 08/17/2019 08/16/2019  Weight (lbs) 151 lb 153 lb 9.6 oz 153 lb  Weight (kg) 68.493 kg 69.673 kg 69.4 kg      Telemetry    Sinus rhythm, sinus tachycardia.   PVCs.  No events  - Personally Reviewed  ECG    08/16/2019: Atrial fibrillation.  Rate 107 bpm.  Inferior ST elevation. - Personally Reviewed  Physical Exam   VS:  BP 111/70 (BP Location: Left Arm)   Pulse 80   Temp 98.1 F (36.7 C) (Oral)   Resp 17   Ht 5\' 3"  (1.6 m)   Wt 68.5 kg   SpO2 93%   BMI 26.75 kg/m  , BMI Body mass index is 26.75 kg/m. GENERAL:  Well appearing HEENT: Pupils equal round and reactive, fundi not visualized, oral mucosa unremarkable NECK:  No jugular venous distention, waveform within normal limits, carotid upstroke brisk and symmetric, no bruits LUNGS:  Clear to auscultation bilaterally HEART:  RRR.  PMI not displaced or sustained,S1 and S2 within normal limits, no S3, no S4, no clicks, no rubs, mo murmurs ABD:  Flat, positive bowel sounds normal in frequency in pitch, no bruits, no rebound, no guarding, no midline pulsatile mass, no hepatomegaly, no splenomegaly EXT:  2 plus pulses throughout, no edema, no cyanosis no clubbing.  Right wrist cath site clean, dry, and intact. SKIN:  No rashes no nodules NEURO:  Cranial  nerves II through XII grossly intact, motor grossly intact throughout [facility]:  Cognitively intact, oriented to person place and time]  Labs    High Sensitivity Troponin:   Recent Labs  Lab 08/15/19 1024 08/15/19 1241 08/15/19 1656 08/15/19 2014  TROPONINIHS 171* 206* 341* 232*      Chemistry Recent Labs  Lab 08/16/19 0508 08/16/19 1756 08/17/19 0732  NA 140 137 138  K 4.7 3.8 3.5  CL 106 104 104  CO2 25 24 22   GLUCOSE 111* 168* 116*  BUN 8 7* 6*  CREATININE 0.71 0.70 0.80  CALCIUM 9.0 8.9 8.8*  GFRNONAA >60 >60 >60  GFRAA >60 >60 >60  ANIONGAP 9 9 12      Hematology Recent Labs  Lab 08/16/19 0508 08/16/19 1756 08/17/19 0732  WBC 16.8* 19.5* 18.1*  RBC 4.29 4.39 4.29  HGB 14.2 14.5 13.9  HCT 40.8 41.3 42.3  MCV 95.1 94.1 98.6  MCH 33.1 33.0 32.4  MCHC 34.8 35.1 32.9  RDW 13.2 13.2 13.2  PLT 318 306 298     BNPNo results for input(s): BNP, PROBNP in the last 168 hours.   DDimer No results for input(s): DDIMER in the last 168 hours.   Radiology    ECHOCARDIOGRAM LIMITED  Result Date: 08/17/2019    ECHOCARDIOGRAM LIMITED REPORT   Patient Name:   Cheryl Briggs Date of Exam: 08/17/2019 Medical Rec #:  [id number]         Height:       62.0 in Accession #:    [id number]        Weight:       153.0 lb Date of Birth:  Aug 02, 1945         BSA:          1.706 m Patient Age:    74 years          BP:           140/58 mmHg Patient Gender: F                 HR:           95 bpm. Exam Location:  [facility] Procedure: 2D Echo, Limited Echo, Cardiac Doppler and Color Doppler Indications:     Acute myocardial infarction 410  History:         Patient has prior history of Echocardiogram examinations.  Sonographer:     Alyse Low Roar Referring Phys:  [place] Diagnosing Phys: Skeet Latch MD IMPRESSIONS  1. Left ventricular ejection fraction, by estimation, is 60 to 65%. The left ventricle has normal function. The left ventricle has no regional wall motion abnormalities. There is mild concentric left ventricular hypertrophy. Left ventricular diastolic parameters are consistent with Grade I diastolic dysfunction (impaired relaxation). Elevated left ventricular end-diastolic pressure.  2. Right ventricular systolic function is normal. The right ventricular size is normal.  3. The mitral valve is normal in structure. Trivial mitral valve regurgitation. No evidence of mitral stenosis.  4. The aortic valve is tricuspid. Aortic valve regurgitation is not visualized. No aortic stenosis is present. FINDINGS  Left Ventricle: Left ventricular ejection fraction, by estimation, is 60 to 65%. The left ventricle has normal function. The left ventricle has no regional wall motion abnormalities. The left ventricular internal cavity size was normal in size. There is  mild concentric left ventricular hypertrophy. Elevated left  ventricular end-diastolic pressure. Right Ventricle: The right ventricular size is normal. No increase in right ventricular wall thickness. Right ventricular systolic  function is normal. Left Atrium: Left atrial size was normal in size. Right Atrium: Right atrial size was normal in size. Pericardium: Trivial pericardial effusion is present. Mitral Valve: The mitral valve is normal in structure. Normal mobility of the mitral valve leaflets. Trivial mitral valve regurgitation. No evidence of mitral valve stenosis. Tricuspid Valve: The tricuspid valve is normal in structure. Tricuspid valve regurgitation is trivial. No evidence of tricuspid stenosis. Aortic Valve: The aortic valve is tricuspid. Aortic valve regurgitation is not visualized. No aortic stenosis is present. Aortic valve peak gradient measures 13.1 mmHg. Pulmonic Valve: The pulmonic valve was normal in structure. Pulmonic valve regurgitation is not visualized. No evidence of pulmonic stenosis. Aorta: The aortic root is normal in size and structure. Venous: The inferior vena cava was not well visualized. IAS/Shunts: No atrial level shunt detected by color flow Doppler. LEFT VENTRICLE PLAX 2D LVIDd:         3.64 cm  Diastology LVIDs:         2.46 cm  LV e' lateral:   7.51 cm/s LV PW:         1.04 cm  LV E/e' lateral: 11.8 LV IVS:        1.21 cm  LV e' medial:    4.35 cm/s LVOT diam:     1.70 cm  LV E/e' medial:  20.4 LVOT Area:     2.27 cm  RIGHT VENTRICLE RV Mid diam:    2.95 cm RV S prime:     14.90 cm/s TAPSE (M-mode): 2.0 cm LEFT ATRIUM             Index       RIGHT ATRIUM          Index LA diam:        3.50 cm 2.05 cm/m  RA Area:     9.52 cm LA Vol (A2C):   32.5 ml 19.05 ml/m RA Volume:   19.70 ml 11.55 ml/m LA Vol (A4C):   26.2 ml 15.36 ml/m LA Biplane Vol: 29.1 ml 17.06 ml/m  AORTIC VALVE                PULMONIC VALVE AV Area (Vmax): 1.30 cm    PV Vmax:        1.07 m/s AV Vmax:        181.00 cm/s PV Peak grad:   4.6 mmHg AV Peak Grad:   13.1  mmHg   RVOT Peak grad: 2 mmHg LVOT Vmax:      104.00 cm/s  AORTA Ao Root diam: 2.50 cm MITRAL VALVE                TRICUSPID VALVE MV Area (PHT): 4.21 cm     TR Peak grad:   29.2 mmHg MV Decel Time: 180 msec     TR Vmax:        270.00 cm/s MV E velocity: 88.80 cm/s MV A velocity: 108.00 cm/s  SHUNTS MV E/A ratio:  0.82         Systemic Diam: 1.70 cm MV A Prime:    11.9 cm/s Skeet Latch MD Electronically signed by Skeet Latch MD Signature Date/Time: 08/17/2019/2:21:07 PM    Final     Cardiac Studies   Echo 08/17/19: IMPRESSIONS    1. Left ventricular ejection fraction, by estimation, is 60 to 65%. The  left ventricle has normal function. The left ventricle has no regional  wall motion abnormalities. There is mild concentric left ventricular  hypertrophy.  Left ventricular diastolic  parameters are consistent with Grade I diastolic dysfunction (impaired  relaxation). Elevated left ventricular end-diastolic pressure.  2. Right ventricular systolic function is normal. The right ventricular  size is normal.  3. The mitral valve is normal in structure. Trivial mitral valve  regurgitation. No evidence of mitral stenosis.  4. The aortic valve is tricuspid. Aortic valve regurgitation is not  visualized. No aortic stenosis is present.   LHC 08/16/19: Conclusions: 1. 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 2. Moderately elevated left ventricular filling pressure. 3. Complex PCI to distal RCA, rPDA, and rPLA using Synergy 2.25 x 20 mm drug-eluting stent extending from the distal RCA into the rPDA, as well as angioplasty of the ostial rPL branch.  Intervention was complicated by no-reflow with transient hypotension/bradycardia and subsequent development of atrial fibrillation with rapid ventricular  response.  Final angiogram shows 0% residual stenosis in the distal RCA/rPDA and 10% residual stenosis at ostial rPL with TIMI-2 flow.  Recommendations: 1. Transfer to ICU for aggressive medical therapy and post-PCI monitoring. 2. Continue tirofiban infusion for 18 hours. 3. Dual antiplatelet therapy with aspirin and ticagrelor for at least 12 months. 4. Titrate nitroglycerine infusion for relief of chest pain. 5. Continue amiodarone infusion for rate control and hopefully conversion to sinus rhythm.  If patient remains in atrial fibrillation tomorrow, IV heparin should be started (will defer heparin at this time given tirofiban infusion). 6. Aggressive secondary prevention. 7. Anticipate relook catheterization on Monday with PCI to the LCx.  I favor medical therapy for D2, given relatively small vessel size and need to stent back to the LAD, were intervention undertaken.   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  She was found to have multivessel disease at cath and underwent PCI to the distal RCA/PDA and RPL.  Assessment & Plan    # NSTEMI: # Pure hypercholesterolemia:  Ms. Voisin was found to have multivessel CAD on cath.  Patient underwent PCI to the distal RCA/RPDA, and RPL.  A drug-eluting stent was placed from the distal RCA into the RPDA.  She had angioplasty of an ostial RPL branch.  This was complicated by no-reflow after post stent dilatation.  She developed hypotension, bradycardia, and atrial fibrillation with rapid ventricular response.  She was started on amiodarone and tirofiban.  Metoprolol has been started as well.  Ultimately her angiogram revealed 0% rersidual stenosis in the distal RCA/RPDA and 10% in the ostial RPL.  Tirofiban has been discontinued.  She will go for left circumflex PCI on Monday.  Per Dr. Saunders Revel planning for medical management of her 80% D2 lesion.  She also has a chronically occluded OM  lesion.  She is now feeling much better and has no recurrent chest pain.  Echo today showed preserved systolic function without any wall normal motion abnormalities.  LDL was 82 this admission.  Simvastatin was switched to atorvastatin 80 mg.  She will need repeat lipids and a CMP in 6 to 8 weeks.  Plan to continue aspirin and ticagrelor for 12 months.  NPO after midnight for PCI tomorrow.  BMP in the AM.  Risks and benefits of cardiac catheterization have been discussed with the patient.  The patient understands that risks included but are not limited  to stroke (1 in 1000), death (1 in 28), kidney failure [usually temporary] (1 in 500), bleeding (1 in 200), allergic reaction [possibly serious] (1 in 200). The patient understands and agrees to proceed.   # PAF:  Ms. Vonruden developed paroxysmal atrial fibrillation with rapid ventricular response.  She became hypotensive and bradycardic.  She was initially on IV amiodarone and this was transitioned to oral.  Would continue this at least through her heart cath on Monday.  No plan for oral anticoagulation at this time as this was her first episode and occurred in the setting of no reflow at her cath.  Continue metoprolol.       For questions or updates, please contact [place] Please consult [email or web address] for contact info under        Signed, Skeet Latch, MD  08/18/2019, 12:57 PM

## 2019-08-18 NOTE — Plan of Care (Signed)
  Problem: Activity: Goal: Ability to return to baseline activity level will improve Outcome: Progressing   Problem: Cardiovascular: Goal: Ability to achieve and maintain adequate cardiovascular perfusion will improve Outcome: Progressing Goal: Vascular access site(s) Level 0-1 will be maintained Outcome: Progressing   Problem: Activity: Goal: Ability to return to baseline activity level will improve Outcome: Progressing   Problem: Cardiovascular: Goal: Ability to achieve and maintain adequate cardiovascular perfusion will improve Outcome: Progressing Goal: Vascular access site(s) Level 0-1 will be maintained Outcome: Progressing   Problem: Pain Managment: Goal: General experience of comfort will improve Outcome: Progressing

## 2019-08-18 NOTE — Progress Notes (Signed)
Pt arrived from ICU, A&O x 4. Tele monitor placed and called to CCMD. VS taken, skin assessed, oriented to room.

## 2019-08-19 ENCOUNTER — Encounter
Admission: EM | Disposition: A | Discharge status: Home / Self Care | Payer: Self-pay | Source: Home / Self Care | Attending: Internal Medicine

## 2019-08-19 ENCOUNTER — Other Ambulatory Visit: Payer: Self-pay

## 2019-08-19 ENCOUNTER — Encounter: Payer: Self-pay | Admitting: Internal Medicine

## 2019-08-19 ENCOUNTER — Telehealth: Payer: Self-pay

## 2019-08-19 DIAGNOSIS — E785 Hyperlipidemia, unspecified: Secondary | ICD-10-CM

## 2019-08-19 DIAGNOSIS — I2510 Atherosclerotic heart disease of native coronary artery without angina pectoris: Secondary | ICD-10-CM

## 2019-08-19 HISTORY — PX: CORONARY STENT INTERVENTION: CATH118234

## 2019-08-19 LAB — BASIC METABOLIC PANEL
Anion gap: 9 (ref 5–15)
BUN: 9 mg/dL (ref 8–23)
CO2: 23 mmol/L (ref 22–32)
Calcium: 8.9 mg/dL (ref 8.9–10.3)
Chloride: 106 mmol/L (ref 98–111)
Creatinine, Ser: 0.76 mg/dL (ref 0.44–1.00)
GFR calc Af Amer: >60 mL/min (ref >60)
GFR calc non Af Amer: >60 mL/min (ref >60)
Glucose, Bld: 108 mg/dL — ABNORMAL HIGH (ref 70–99)
Potassium: 3.6 mmol/L (ref 3.5–5.1)
Sodium: 138 mmol/L (ref 135–145)

## 2019-08-19 LAB — CBC
HCT: 39.4 % (ref 36.0–46.0)
Hemoglobin: 13.6 g/dL (ref 12.0–15.0)
MCH: 32.9 pg (ref 26.0–34.0)
MCHC: 34.5 g/dL (ref 30.0–36.0)
MCV: 95.4 fL (ref 80.0–100.0)
Platelets: 283 10*3/uL (ref 150–400)
RBC: 4.13 MIL/uL (ref 3.87–5.11)
RDW: 13.1 % (ref 11.5–15.5)
WBC: 16 10*3/uL — ABNORMAL HIGH (ref 4.0–10.5)
nRBC: 0 % (ref 0.0–0.2)

## 2019-08-19 LAB — GLUCOSE, CAPILLARY: Glucose-Capillary: 151 mg/dL — ABNORMAL HIGH (ref 70–99)

## 2019-08-19 LAB — POCT ACTIVATED CLOTTING TIME: Activated Clotting Time: 285 seconds

## 2019-08-19 SURGERY — LEFT HEART CATH AND CORONARY ANGIOGRAPHY
Anesthesia: Moderate Sedation

## 2019-08-19 SURGERY — CORONARY STENT INTERVENTION
Anesthesia: Moderate Sedation | Laterality: Left

## 2019-08-19 MED ORDER — ASPIRIN 81 MG PO CHEW
CHEWABLE_TABLET | ORAL | Status: AC
  Administered 2019-08-19: 81 mg
  Filled 2019-08-19: qty 1

## 2019-08-19 MED ORDER — MIDAZOLAM HCL 2 MG/2ML IJ SOLN
INTRAMUSCULAR | Status: DC | PRN
  Administered 2019-08-19: 1 mg via INTRAVENOUS

## 2019-08-19 MED ORDER — TICAGRELOR 90 MG PO TABS: 90.0000 mg | ORAL_TABLET | Freq: Two times a day (BID) | ORAL | 0 refills | Status: DC

## 2019-08-19 MED ORDER — HEPARIN (PORCINE) IN NACL 1000-0.9 UT/500ML-% IV SOLN
INTRAVENOUS | Status: DC | PRN
  Administered 2019-08-19: 500 mL

## 2019-08-19 MED ORDER — NITROGLYCERIN 1 MG/10 ML FOR IR/CATH LAB
INTRA_ARTERIAL | Status: DC | PRN
  Administered 2019-08-19: 200 ug via INTRA_ARTERIAL
  Administered 2019-08-19: 200 ug via INTRACORONARY

## 2019-08-19 MED ORDER — MIDAZOLAM HCL 2 MG/2ML IJ SOLN
INTRAMUSCULAR | Status: AC
  Filled 2019-08-19: qty 2

## 2019-08-19 MED ORDER — SODIUM CHLORIDE 0.9 % IV SOLN: INTRAVENOUS | Status: AC

## 2019-08-19 MED ORDER — AMIODARONE HCL 200 MG PO TABS: 200.0000 mg | ORAL_TABLET | Freq: Every day | ORAL | 0 refills | Status: DC

## 2019-08-19 MED ORDER — HEPARIN (PORCINE) IN NACL 1000-0.9 UT/500ML-% IV SOLN
INTRAVENOUS | Status: AC
  Filled 2019-08-19: qty 1000

## 2019-08-19 MED ORDER — METOPROLOL SUCCINATE ER 50 MG PO TB24
50.0000 mg | ORAL_TABLET | Freq: Every day | ORAL | Status: DC
  Administered 2019-08-19: 50 mg via ORAL
  Filled 2019-08-19: qty 1

## 2019-08-19 MED ORDER — NITROGLYCERIN 1 MG/10 ML FOR IR/CATH LAB
INTRA_ARTERIAL | Status: AC
  Filled 2019-08-19: qty 10

## 2019-08-19 MED ORDER — TICAGRELOR 90 MG PO TABS
ORAL_TABLET | ORAL | Status: DC | PRN
  Administered 2019-08-19: 90 mg via ORAL

## 2019-08-19 MED ORDER — VERAPAMIL HCL 2.5 MG/ML IV SOLN
INTRAVENOUS | Status: DC | PRN
  Administered 2019-08-19 (×2): 2.5 mg via INTRA_ARTERIAL

## 2019-08-19 MED ORDER — SODIUM CHLORIDE 0.9 % IV SOLN: 250.0000 mL | INTRAVENOUS | Status: DC | PRN

## 2019-08-19 MED ORDER — SODIUM CHLORIDE 0.9% FLUSH: 3.0000 mL | INTRAVENOUS | Status: DC | PRN

## 2019-08-19 MED ORDER — SODIUM CHLORIDE 0.9% FLUSH
3.0000 mL | Freq: Two times a day (BID) | INTRAVENOUS | Status: DC
  Administered 2019-08-20 – 2019-08-22 (×4): 3 mL via INTRAVENOUS

## 2019-08-19 MED ORDER — ASPIRIN 81 MG PO TBEC: 81.0000 mg | DELAYED_RELEASE_TABLET | Freq: Every day | ORAL | Status: DC

## 2019-08-19 MED ORDER — FENTANYL CITRATE (PF) 100 MCG/2ML IJ SOLN
INTRAMUSCULAR | Status: DC | PRN
  Administered 2019-08-19: 25 ug via INTRAVENOUS

## 2019-08-19 MED ORDER — TICAGRELOR 90 MG PO TABS
ORAL_TABLET | ORAL | Status: AC
  Filled 2019-08-19: qty 1

## 2019-08-19 MED ORDER — HEPARIN SODIUM (PORCINE) 1000 UNIT/ML IJ SOLN
INTRAMUSCULAR | Status: AC
  Filled 2019-08-19: qty 1

## 2019-08-19 MED ORDER — VERAPAMIL HCL 2.5 MG/ML IV SOLN
INTRAVENOUS | Status: AC
  Filled 2019-08-19: qty 2

## 2019-08-19 MED ORDER — HEPARIN SODIUM (PORCINE) 1000 UNIT/ML IJ SOLN
INTRAMUSCULAR | Status: DC | PRN
  Administered 2019-08-19: 1500 [IU] via INTRAVENOUS
  Administered 2019-08-19: 7000 [IU] via INTRAVENOUS

## 2019-08-19 MED ORDER — FENTANYL CITRATE (PF) 100 MCG/2ML IJ SOLN
INTRAMUSCULAR | Status: AC
  Filled 2019-08-19: qty 2

## 2019-08-19 MED ORDER — AMIODARONE HCL 200 MG PO TABS: 200.0000 mg | ORAL_TABLET | Freq: Every day | ORAL | Status: DC

## 2019-08-19 MED ORDER — IOHEXOL 300 MG/ML  SOLN
INTRAMUSCULAR | Status: DC | PRN
  Administered 2019-08-19: 70 mL

## 2019-08-19 MED ORDER — ATORVASTATIN CALCIUM 80 MG PO TABS: 80.0000 mg | ORAL_TABLET | Freq: Every day | ORAL | 0 refills | Status: DC

## 2019-08-19 SURGICAL SUPPLY — 12 items
CATH INFINITI JR4 5F (CATHETERS) ×1 IMPLANT
CATH LAUNCHER 5F EBU3.5 (CATHETERS) ×1 IMPLANT
CATH LAUNCHER 6FR EBU3.5 (CATHETERS) ×1 IMPLANT
DEVICE INFLAT 30 PLUS (MISCELLANEOUS) ×1 IMPLANT
DEVICE RAD TR BAND REGULAR (VASCULAR PRODUCTS) ×1 IMPLANT
GLIDESHEATH SLEND SS 6F .021 (SHEATH) ×1 IMPLANT
GUIDEWIRE INQWIRE 1.5J.035X260 (WIRE) IMPLANT
INQWIRE 1.5J .035X260CM (WIRE) ×2
KIT MANI 3VAL PERCEP (MISCELLANEOUS) ×2 IMPLANT
PACK CARDIAC CATH (CUSTOM PROCEDURE TRAY) ×2 IMPLANT
STENT RESOLUTE ONYX 2.75X18 (Permanent Stent) ×1 IMPLANT
WIRE RUNTHROUGH .014X180CM (WIRE) ×1 IMPLANT

## 2019-08-19 NOTE — Progress Notes (Signed)
Hematoma size reduced overall but still present.  Blood pressure cuff removed, koban wrapped around patients arm.  Vss, hemoglobin stable.  Patient has some pain in arm when touched/moved.

## 2019-08-19 NOTE — Care Management Important Message (Signed)
Important Message  Patient Details  Name: Nickia Boesen MRN: [id number] Date of Birth: [birth year]   Medicare Important Message Given:  Yes     Dannette Barbara 08/19/2019, 11:56 AM

## 2019-08-19 NOTE — Progress Notes (Signed)
Progress Note  Patient Name: Cheryl Briggs Date of Encounter: 08/19/2019  [facility] Cardiologist:Dr. Fletcher Anon  Subjective   The patient underwent left heart catheterization today which showed widely patent RCA stent with normal flow distally.  I proceeded with PCI of the distal left circumflex without immediate complications.  She is doing well now with no chest pain or shortness of breath.  Inpatient Medications    Scheduled Meds: . [MAR Hold] amiodarone  200 mg Oral BID  . [MAR Hold] aspirin EC  81 mg Oral Daily  . [MAR Hold] atorvastatin  80 mg Oral Daily  . [MAR Hold] calcium-vitamin D  1 tablet Oral BID WC  . [MAR Hold] Chlorhexidine Gluconate Cloth  6 each Topical Daily  . [MAR Hold] cholecalciferol  3,000 Units Oral Daily  . [MAR Hold] heparin  5,000 Units Subcutaneous Q8H  . [MAR Hold] metoCLOPramide (REGLAN) injection  5 mg Intravenous Q8H  . [MAR Hold] metoprolol tartrate  25 mg Oral BID  . [MAR Hold] multivitamin with minerals  1 tablet Oral Daily  . [MAR Hold] mupirocin ointment  1 application Nasal BID  . [MAR Hold] pantoprazole  40 mg Oral Daily  . [MAR Hold] sodium chloride flush  3 mL Intravenous Once  . [MAR Hold] sodium chloride flush  3 mL Intravenous Q12H  . sodium chloride flush  3 mL Intravenous Q12H  . [MAR Hold] ticagrelor  90 mg Oral BID   Continuous Infusions: . sodium chloride    . sodium chloride     PRN Meds: sodium chloride, [MAR Hold] acetaminophen, [MAR Hold] nitroGLYCERIN, [MAR Hold] ondansetron (ZOFRAN) IV, sodium chloride flush   Vital Signs    Vitals:   08/19/19 0534 08/19/19 0718 08/19/19 0839 08/19/19 0845  BP: 124/66 130/72 134/60 128/62  Pulse: 69 87 88 86  Resp: 18  18 20   Temp: 98.4 F (36.9 C) 98.4 F (36.9 C)    TempSrc: Oral Oral    SpO2: 94% 96% 93% 93%  Weight: 68 kg     Height:        Intake/Output Summary (Last 24 hours) at 08/19/2019 0858 Last data filed at 08/19/2019 7106 Gross per 24 hour  Intake 580 ml    Output 1150 ml  Net -570 ml   Last 3 Weights 08/19/2019 08/18/2019 08/17/2019  Weight (lbs) 149 lb 14.6 oz 151 lb 153 lb 9.6 oz  Weight (kg) 68 kg 68.493 kg 69.673 kg      Telemetry    Sinus rhythm, sinus tachycardia.  PVCs.  No events  - Personally Reviewed  ECG    08/16/2019: Atrial fibrillation.  Rate 107 bpm.  Inferior ST elevation. - Personally Reviewed  Physical Exam   VS:  BP 128/62   Pulse 86   Temp 98.4 F (36.9 C) (Oral)   Resp 20   Ht 5\' 3"  (1.6 m)   Wt 68 kg   SpO2 93%   BMI 26.56 kg/m  , BMI Body mass index is 26.56 kg/m. GENERAL:  Well appearing HEENT: Pupils equal round and reactive, fundi not visualized, oral mucosa unremarkable NECK:  No jugular venous distention, waveform within normal limits, carotid upstroke brisk and symmetric, no bruits LUNGS:  Clear to auscultation bilaterally HEART:  RRR.  PMI not displaced or sustained,S1 and S2 within normal limits, no S3, no S4, no clicks, no rubs, mo murmurs ABD:  Flat, positive bowel sounds normal in frequency in pitch, no bruits, no rebound, no guarding, no midline pulsatile mass,  no hepatomegaly, no splenomegaly EXT:  2 plus pulses throughout, no edema, no cyanosis no clubbing.  Right wrist cath site clean, dry, and intact. SKIN:  No rashes no nodules NEURO:  Cranial nerves II through XII grossly intact, motor grossly intact throughout PSYCH:  Cognitively intact, oriented to person place and time]  Labs    High Sensitivity Troponin:   Recent Labs  Lab 08/15/19 1024 08/15/19 1241 08/15/19 1656 08/15/19 2014  TROPONINIHS 171* 206* 341* 232*      Chemistry Recent Labs  Lab 08/16/19 1756 08/17/19 0732 08/19/19 0621  NA 137 138 138  K 3.8 3.5 3.6  CL 104 104 106  CO2 24 22 23   GLUCOSE 168* 116* 108*  BUN 7* 6* 9  CREATININE 0.70 0.80 0.76  CALCIUM 8.9 8.8* 8.9  GFRNONAA >60 >60 >60  GFRAA >60 >60 >60  ANIONGAP 9 12 9      Hematology Recent Labs  Lab 08/16/19 0508 08/16/19 1756  08/17/19 0732  WBC 16.8* 19.5* 18.1*  RBC 4.29 4.39 4.29  HGB 14.2 14.5 13.9  HCT 40.8 41.3 42.3  MCV 95.1 94.1 98.6  MCH 33.1 33.0 32.4  MCHC 34.8 35.1 32.9  RDW 13.2 13.2 13.2  PLT 318 306 298    BNPNo results for input(s): BNP, PROBNP in the last 168 hours.   DDimer No results for input(s): DDIMER in the last 168 hours.   Radiology    CARDIAC CATHETERIZATION  Result Date: 08/19/2019  2nd Diag lesion is 80% stenosed.  1st Mrg lesion is 100% stenosed.  Prox Cx to Mid Cx lesion is 60% stenosed.  Dist Cx lesion is 80% stenosed.  Mid RCA lesion is 55% stenosed.  RPAV lesion is 10% stenosed.  Balloon angioplasty was performed.  Previously placed Dist RCA drug-eluting stents are widely patent with 0% stenosed side branch in RPDA.  Post intervention, there is a 0% residual stenosis.  A drug-eluting stent was successfully placed using a STENT RESOLUTE ONYX 2.75X18.  1.  Widely patent RCA stent with normal flow distally.  Unchanged left coronary artery disease. 2.  Mildly elevated left ventricular end-diastolic pressure at 14 mmHg. 3.  Successful direct stenting of the distal left circumflex. Recommendations: Continue dual antiplatelet therapy for 1 year. Aggressive treatment of risk factors. If the patient remains stable, she can potentially be discharged home in the afternoon.   ECHOCARDIOGRAM LIMITED  Result Date: 08/17/2019    ECHOCARDIOGRAM LIMITED REPORT   Patient Name:   Cheryl Briggs Date of Exam: 08/17/2019 Medical Rec #:  [id number]         Height:       62.0 in Accession #:    [id number]        Weight:       153.0 lb Date of Birth:  12/08/1945         BSA:          1.706 m Patient Age:    74 years          BP:           140/58 mmHg Patient Gender: F                 HR:           95 bpm. Exam Location:  [facility] Procedure: 2D Echo, Limited Echo, Cardiac Doppler and Color Doppler Indications:     Acute myocardial infarction 410  History:         Patient has prior history of  Echocardiogram examinations.  Sonographer:     Alyse Low Roar Referring Phys:  [place] Diagnosing Phys: Skeet Latch MD IMPRESSIONS  1. Left ventricular ejection fraction, by estimation, is 60 to 65%. The left ventricle has normal function. The left ventricle has no regional wall motion abnormalities. There is mild concentric left ventricular hypertrophy. Left ventricular diastolic parameters are consistent with Grade I diastolic dysfunction (impaired relaxation). Elevated left ventricular end-diastolic pressure.  2. Right ventricular systolic function is normal. The right ventricular size is normal.  3. The mitral valve is normal in structure. Trivial mitral valve regurgitation. No evidence of mitral stenosis.  4. The aortic valve is tricuspid. Aortic valve regurgitation is not visualized. No aortic stenosis is present. FINDINGS  Left Ventricle: Left ventricular ejection fraction, by estimation, is 60 to 65%. The left ventricle has normal function. The left ventricle has no regional wall motion abnormalities. The left ventricular internal cavity size was normal in size. There is  mild concentric left ventricular hypertrophy. Elevated left ventricular end-diastolic pressure. Right Ventricle: The right ventricular size is normal. No increase in right ventricular wall thickness. Right ventricular systolic function is normal. Left Atrium: Left atrial size was normal in size. Right Atrium: Right atrial size was normal in size. Pericardium: Trivial pericardial effusion is present. Mitral Valve: The mitral valve is normal in structure. Normal mobility of the mitral valve leaflets. Trivial mitral valve regurgitation. No evidence of mitral valve stenosis. Tricuspid Valve: The tricuspid valve is normal in structure. Tricuspid valve regurgitation is trivial. No evidence of tricuspid stenosis. Aortic Valve: The aortic valve is tricuspid. Aortic valve regurgitation is not visualized. No aortic stenosis is  present. Aortic valve peak gradient measures 13.1 mmHg. Pulmonic Valve: The pulmonic valve was normal in structure. Pulmonic valve regurgitation is not visualized. No evidence of pulmonic stenosis. Aorta: The aortic root is normal in size and structure. Venous: The inferior vena cava was not well visualized. IAS/Shunts: No atrial level shunt detected by color flow Doppler. LEFT VENTRICLE PLAX 2D LVIDd:         3.64 cm  Diastology LVIDs:         2.46 cm  LV e' lateral:   7.51 cm/s LV PW:         1.04 cm  LV E/e' lateral: 11.8 LV IVS:        1.21 cm  LV e' medial:    4.35 cm/s LVOT diam:     1.70 cm  LV E/e' medial:  20.4 LVOT Area:     2.27 cm  RIGHT VENTRICLE RV Mid diam:    2.95 cm RV S prime:     14.90 cm/s TAPSE (M-mode): 2.0 cm LEFT ATRIUM             Index       RIGHT ATRIUM          Index LA diam:        3.50 cm 2.05 cm/m  RA Area:     9.52 cm LA Vol (A2C):   32.5 ml 19.05 ml/m RA Volume:   19.70 ml 11.55 ml/m LA Vol (A4C):   26.2 ml 15.36 ml/m LA Biplane Vol: 29.1 ml 17.06 ml/m  AORTIC VALVE                PULMONIC VALVE AV Area (Vmax): 1.30 cm    PV Vmax:        1.07 m/s AV Vmax:        181.00 cm/s PV Peak  grad:   4.6 mmHg AV Peak Grad:   13.1 mmHg   RVOT Peak grad: 2 mmHg LVOT Vmax:      104.00 cm/s  AORTA Ao Root diam: 2.50 cm MITRAL VALVE                TRICUSPID VALVE MV Area (PHT): 4.21 cm     TR Peak grad:   29.2 mmHg MV Decel Time: 180 msec     TR Vmax:        270.00 cm/s MV E velocity: 88.80 cm/s MV A velocity: 108.00 cm/s  SHUNTS MV E/A ratio:  0.82         Systemic Diam: 1.70 cm MV A Prime:    11.9 cm/s Skeet Latch MD Electronically signed by Skeet Latch MD Signature Date/Time: 08/17/2019/2:21:07 PM    Final     Cardiac Studies   Echo 08/17/19: IMPRESSIONS    1. Left ventricular ejection fraction, by estimation, is 60 to 65%. The  left ventricle has normal function. The left ventricle has no regional  wall motion abnormalities. There is mild concentric left ventricular   hypertrophy. Left ventricular diastolic  parameters are consistent with Grade I diastolic dysfunction (impaired  relaxation). Elevated left ventricular end-diastolic pressure.  2. Right ventricular systolic function is normal. The right ventricular  size is normal.  3. The mitral valve is normal in structure. Trivial mitral valve  regurgitation. No evidence of mitral stenosis.  4. The aortic valve is tricuspid. Aortic valve regurgitation is not  visualized. No aortic stenosis is present.   LHC 08/16/19: Conclusions: 1. 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 2. Moderately elevated left ventricular filling pressure. 3. Complex PCI to distal RCA, rPDA, and rPLA using Synergy 2.25 x 20 mm drug-eluting stent extending from the distal RCA into the rPDA, as well as angioplasty of the ostial rPL branch.  Intervention was complicated by no-reflow with transient hypotension/bradycardia and subsequent development of atrial fibrillation with rapid ventricular response.  Final angiogram shows 0% residual stenosis in the distal RCA/rPDA and 10% residual stenosis at ostial rPL with TIMI-2 flow.  Recommendations: 1. Transfer to ICU for aggressive medical therapy and post-PCI monitoring. 2. Continue tirofiban infusion for 18 hours. 3. Dual antiplatelet therapy with aspirin and ticagrelor for at least 12 months. 4. Titrate nitroglycerine infusion for relief of chest pain. 5. Continue amiodarone infusion for rate control and hopefully conversion to sinus rhythm.  If patient remains in atrial fibrillation tomorrow, IV heparin should be started (will defer heparin at this time given tirofiban infusion). 6. Aggressive secondary prevention. 7. Anticipate relook catheterization on Monday with PCI to the LCx.  I  favor medical therapy for D2, given relatively small vessel size and need to stent back to the LAD, were intervention undertaken.   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  She was found to have multivessel disease at cath and underwent PCI to the distal RCA/PDA and RPL.  Assessment & Plan    1.  Non-ST elevation myocardial infarction: Cardiac catheterization showed significant two-vessel coronary artery disease.  Status post distal RCA bifurcation PCI and drug-eluting stent placement on Friday.  This was complicated by no reflow which was treated successfully with intracoronary adenosine.  She had transient hypotension  and A. fib with RVR that has all resolved.  Relook angiography this morning showed patent RCA stent with normal flow distally.  Left circumflex disease was unchanged and I performed successful drug-eluting stent placement to the distal left circumflex. Continue dual antiplatelet therapy with aspirin and ticagrelor for at least 12 months. The patient was referred to cardiac rehab. Continue aggressive treatment of risk factors. Ejection fraction was normal. I switch metoprolol back to Toprol.  2.  Post PCI A. fib with RVR: I suspect that this was likely due to ischemia.  No need for anticoagulation.  She is currently on amiodarone.  I decrease the dose to 200 mg once daily with plans to stop this after 1 month.  3.  Hyperlipidemia: Continue high-dose atorvastatin.  If the patient remains stable today, she can be discharged home in the afternoon. I will arrange follow-up in our office in 1 to 2 weeks.       For questions or updates, please contact [place] Please consult [email or web address] for contact info under        Signed, Kathlyn Sacramento, MD  08/19/2019, 8:58 AM

## 2019-08-19 NOTE — Progress Notes (Signed)
Patient radial site is oozing.  Bandage changed, MD aware.  Will continue to monitor.

## 2019-08-19 NOTE — Telephone Encounter (Signed)
TCM....  Patient is being discharged      They are scheduled to see Mickle Plumb 8/23 at 230   They were seen for NSTEMI  They need to be seen within 1-2 weeks     Please call

## 2019-08-19 NOTE — Telephone Encounter (Signed)
-----   Message from Wellington Hampshire, MD sent at 08/19/2019 10:13 AM EDT ----- TCM follow-up needed in 1 to 2 weeks with me or APP.  Non-STEMI possible discharge in the afternoon today.

## 2019-08-19 NOTE — Progress Notes (Signed)
The patient had cardiac cath and PCI procedure earlier this morning and we were done by 830.  During that catheterization, I had some resistance advancing the 6 [place] EBU guide catheter just before the elbow.  Just, the catheter was removed and additional verapamil was given for suspected spasm.  I elected to use a 5 [place] guide instead which advanced without any resistance at all.  The patient had no issues after the TR band was applied with no evidence of bleeding or hematoma. She was supposed to go home this afternoon but just before planned discharge after 3 PM, she had sudden involvement of right forearm hematoma above the puncture site and below the elbow.  This was enlarging.  By my exam, there was no evidence of compartment syndrome as she had normal sensation and normal radial and ulnar pulses.  I placed manual pressure followed by placing a blood pressure cuff around the area and inflated to 80 millimeters of mercury.  We left this on for about 30 minutes and after that there was significant improvement.  Koban pressure dressing was applied with improvement.  Again distal pulses were palpable.  The patient's discharge was canceled and will monitor the patient overnight.

## 2019-08-19 NOTE — Telephone Encounter (Signed)
TOC- currently admitted call 08/20/19.

## 2019-08-19 NOTE — Discharge Summary (Signed)
Physician Discharge Summary  Cheryl Briggs XTG:[id number] DOB: August 13, 1945 DOA: 08/15/2019  PCP: Joyice Faster, FNP  Admit date: 08/15/2019 Discharge date: 08/19/2019  Discharge disposition: Home   Recommendations for Outpatient Follow-Up:   Follow-up with cardiologist as scheduled Follow-up with cardiac rehab as scheduled   Discharge Diagnosis:   Principal Problem:   NSTEMI (non-ST elevated myocardial infarction) ([place]) Active Problems:   CLL (chronic lymphocytic leukemia) ([place])   Atrial fibrillation with RVR ([place])    Discharge Condition: Stable.  Diet recommendation:  Diet Order            Diet Heart Room service appropriate? Yes; Fluid consistency: Thin  Diet effective now           Diet - low sodium heart healthy                   Code Status: Full Code     Hospital Course:   Ms. Cheryl Briggs is a 74 y.o. female with medical history significant for hyperlipidemia, CLL, who presented to the hospital with midsternal chest pain.  Her troponins were elevated and she was admitted to the hospital for acute NSTEMI.  She was seen in consultation by the cardiologist and she underwent left heart catheterization on 08/16/2019. "  Complex PCI to distal RCA, rPDA and rPLA with drug-eluting stent was performed.  During PCI, she developed transient bradycardia and hypotension and subsequently developed tachycardia and hypotension after receiving atropine and norepinephrine. She then developed atrial fibrillation with RVR requiring IV amiodarone infusion.  She underwent repeat left heart cath on 08/19/2019.  Drug-eluting stent was successfully placed in the distal left circumflex.  Condition has improved significantly.  She has no chest pain.  She has been cleared by cardiologist for discharge.  Discharge plan was discussed with the patient and her daughter at the bedside.  All discharge medications were discussed.    Medical Consultants:    [facility]  cardiology   Discharge Exam:    Vitals:   08/19/19 1030 08/19/19 1100 08/19/19 1130 08/19/19 1200  BP: 123/63 137/66 138/67 130/78  Pulse: 81 90 78 83  Resp: 20 17 19 20   Temp:      TempSrc:      SpO2: 95% 96% 97% 97%  Weight:      Height:         GEN: NAD SKIN: No rash.  EYES: EOMI ENT: MMM CV: RRR PULM: CTA B ABD: soft, ND, NT, +BS CNS: AAO x 3, non focal EXT: No edema or tenderness   The results of significant diagnostics from this hospitalization (including imaging, microbiology, ancillary and laboratory) are listed below for reference.     Procedures and Diagnostic Studies:   ECHOCARDIOGRAM LIMITED  Result Date: 08/17/2019    ECHOCARDIOGRAM LIMITED REPORT   Patient Name:   Cheryl Briggs Date of Exam: 08/17/2019 Medical Rec #:  [id number]         Height:       62.0 in Accession #:    [id number]        Weight:       153.0 lb Date of Birth:  [birth year]         BSA:          1.706 m Patient Age:    3 years          BP:           140/58 mmHg Patient Gender: F  HR:           95 bpm. Exam Location:  [facility] Procedure: 2D Echo, Limited Echo, Cardiac Doppler and Color Doppler Indications:     Acute myocardial infarction 410  History:         Patient has prior history of Echocardiogram examinations.  Sonographer:     Alyse Low Roar Referring Phys:  [place] Diagnosing Phys: Skeet Latch MD IMPRESSIONS  1. Left ventricular ejection fraction, by estimation, is 60 to 65%. The left ventricle has normal function. The left ventricle has no regional wall motion abnormalities. There is mild concentric left ventricular hypertrophy. Left ventricular diastolic parameters are consistent with Grade I diastolic dysfunction (impaired relaxation). Elevated left ventricular end-diastolic pressure.  2. Right ventricular systolic function is normal. The right ventricular size is normal.  3. The mitral valve is normal in structure. Trivial mitral valve regurgitation. No  evidence of mitral stenosis.  4. The aortic valve is tricuspid. Aortic valve regurgitation is not visualized. No aortic stenosis is present. FINDINGS  Left Ventricle: Left ventricular ejection fraction, by estimation, is 60 to 65%. The left ventricle has normal function. The left ventricle has no regional wall motion abnormalities. The left ventricular internal cavity size was normal in size. There is  mild concentric left ventricular hypertrophy. Elevated left ventricular end-diastolic pressure. Right Ventricle: The right ventricular size is normal. No increase in right ventricular wall thickness. Right ventricular systolic function is normal. Left Atrium: Left atrial size was normal in size. Right Atrium: Right atrial size was normal in size. Pericardium: Trivial pericardial effusion is present. Mitral Valve: The mitral valve is normal in structure. Normal mobility of the mitral valve leaflets. Trivial mitral valve regurgitation. No evidence of mitral valve stenosis. Tricuspid Valve: The tricuspid valve is normal in structure. Tricuspid valve regurgitation is trivial. No evidence of tricuspid stenosis. Aortic Valve: The aortic valve is tricuspid. Aortic valve regurgitation is not visualized. No aortic stenosis is present. Aortic valve peak gradient measures 13.1 mmHg. Pulmonic Valve: The pulmonic valve was normal in structure. Pulmonic valve regurgitation is not visualized. No evidence of pulmonic stenosis. Aorta: The aortic root is normal in size and structure. Venous: The inferior vena cava was not well visualized. IAS/Shunts: No atrial level shunt detected by color flow Doppler. LEFT VENTRICLE PLAX 2D LVIDd:         3.64 cm  Diastology LVIDs:         2.46 cm  LV e' lateral:   7.51 cm/s LV PW:         1.04 cm  LV E/e' lateral: 11.8 LV IVS:        1.21 cm  LV e' medial:    4.35 cm/s LVOT diam:     1.70 cm  LV E/e' medial:  20.4 LVOT Area:     2.27 cm  RIGHT VENTRICLE RV Mid diam:    2.95 cm RV S prime:     14.90  cm/s TAPSE (M-mode): 2.0 cm LEFT ATRIUM             Index       RIGHT ATRIUM          Index LA diam:        3.50 cm 2.05 cm/m  RA Area:     9.52 cm LA Vol (A2C):   32.5 ml 19.05 ml/m RA Volume:   19.70 ml 11.55 ml/m LA Vol (A4C):   26.2 ml 15.36 ml/m LA Biplane Vol: 29.1 ml 17.06 ml/m  AORTIC VALVE                PULMONIC VALVE AV Area (Vmax): 1.30 cm    PV Vmax:        1.07 m/s AV Vmax:        181.00 cm/s PV Peak grad:   4.6 mmHg AV Peak Grad:   13.1 mmHg   RVOT Peak grad: 2 mmHg LVOT Vmax:      104.00 cm/s  AORTA Ao Root diam: 2.50 cm MITRAL VALVE                TRICUSPID VALVE MV Area (PHT): 4.21 cm     TR Peak grad:   29.2 mmHg MV Decel Time: 180 msec     TR Vmax:        270.00 cm/s MV E velocity: 88.80 cm/s MV A velocity: 108.00 cm/s  SHUNTS MV E/A ratio:  0.82         Systemic Diam: 1.70 cm MV A Prime:    11.9 cm/s Skeet Latch MD Electronically signed by Skeet Latch MD Signature Date/Time: 08/17/2019/2:21:07 PM    Final      Labs:   Basic Metabolic Panel: Recent Labs  Lab 08/15/19 1024 08/15/19 1024 08/16/19 4401 08/16/19 0272 08/16/19 1756 08/16/19 1756 08/17/19 0732 08/17/19 0741 08/19/19 0621  NA 136  --  140  --  137  --  138  --  138  K 4.4   < > 4.7   < > 3.8   < > 3.5  --  3.6  CL 101  --  106  --  104  --  104  --  106  CO2 25  --  25  --  24  --  22  --  23  GLUCOSE 132*  --  111*  --  168*  --  116*  --  108*  BUN 7*  --  8  --  7*  --  6*  --  9  CREATININE 0.89  --  0.71  --  0.70  --  0.80  --  0.76  CALCIUM 9.7  --  9.0  --  8.9  --  8.8*  --  8.9  MG  --   --   --   --   --   --   --  2.1  --    < > = values in this interval not displayed.   GFR Estimated Creatinine Clearance: 56.2 mL/min (by C-G formula based on SCr of 0.76 mg/dL). Liver Function Tests: No results for input(s): AST, ALT, ALKPHOS, BILITOT, PROT, ALBUMIN in the last 168 hours. No results for input(s): LIPASE, AMYLASE in the last 168 hours. No results for input(s): AMMONIA in the  last 168 hours. Coagulation profile Recent Labs  Lab 08/15/19 1311  INR 1.0    CBC: Recent Labs  Lab 08/15/19 1024 08/16/19 0508 08/16/19 1756 08/17/19 0732  WBC 18.4* 16.8* 19.5* 18.1*  HGB 15.3* 14.2 14.5 13.9  HCT 45.9 40.8 41.3 42.3  MCV 97.2 95.1 94.1 98.6  PLT 339 318 306 298   Cardiac Enzymes: No results for input(s): CKTOTAL, CKMB, CKMBINDEX, TROPONINI in the last 168 hours. BNP: Invalid input(s): POCBNP CBG: Recent Labs  Lab 08/16/19 1205  GLUCAP 151*   D-Dimer No results for input(s): DDIMER in the last 72 hours. Hgb A1c No results for input(s): HGBA1C in the last 72 hours. Lipid Profile No results for input(s): CHOL, HDL, LDLCALC,  TRIG, CHOLHDL, LDLDIRECT in the last 72 hours. Thyroid function studies No results for input(s): TSH, T4TOTAL, T3FREE, THYROIDAB in the last 72 hours.  Invalid input(s): FREET3 Anemia work up No results for input(s): VITAMINB12, FOLATE, FERRITIN, TIBC, IRON, RETICCTPCT in the last 72 hours. Microbiology Recent Results (from the past 240 hour(s))  SARS Coronavirus 2 by RT PCR (hospital order, performed in [facility] hospital lab) Nasopharyngeal Nasopharyngeal Swab     Status: None   Collection Time: 08/15/19 12:41 PM   Specimen: Nasopharyngeal Swab  Result Value Ref Range Status   SARS Coronavirus 2 NEGATIVE NEGATIVE Final    Comment: (NOTE) SARS-CoV-2 target nucleic acids are NOT DETECTED.  The SARS-CoV-2 RNA is generally detectable in upper and lower respiratory specimens during the acute phase of infection. The lowest concentration of SARS-CoV-2 viral copies this assay can detect is 250 copies / mL. A negative result does not preclude SARS-CoV-2 infection and should not be used as the sole basis for treatment or other patient management decisions.  A negative result may occur with improper specimen collection / handling, submission of specimen other than nasopharyngeal swab, presence of viral mutation(s) within  the areas targeted by this assay, and inadequate number of viral copies (<250 copies / mL). A negative result must be combined with clinical observations, patient history, and epidemiological information.  Fact Sheet for Patients:   StrictlyIdeas.no  Fact Sheet for Healthcare Providers: BankingDealers.co.za  This test is not yet approved or  cleared by the [place] FDA and has been authorized for detection and/or diagnosis of SARS-CoV-2 by FDA under an Emergency Use Authorization (EUA).  This EUA will remain in effect (meaning this test can be used) for the duration of the COVID-19 declaration under Section 564(b)(1) of the Act, 21 U.S.C. section 360bbb-3(b)(1), unless the authorization is terminated or revoked sooner.  Performed at [facility], [address], [place], [place] [address]   Surgical PCR screen     Status: None   Collection Time: 08/16/19 10:25 PM   Specimen: Nasal Mucosa; Nasal Swab  Result Value Ref Range Status   MRSA, PCR NEGATIVE NEGATIVE Final   Staphylococcus aureus NEGATIVE NEGATIVE Final    Comment: (NOTE) The Xpert SA Assay (FDA approved for NASAL specimens in patients 33 years of age and older), is one component of a comprehensive surveillance program. It is not intended to diagnose infection nor to guide or monitor treatment. Performed at [facility], [address], [place], [place] [address]      Discharge Instructions:   Discharge Instructions    AMB Referral to Cardiac Rehabilitation - Phase II   Complete by: As directed    Diagnosis:  Coronary Stents NSTEMI     After initial evaluation and assessments completed: Virtual Based Care may be provided alone or in conjunction with Phase 2 Cardiac Rehab based on patient barriers.: Yes   AMB Referral to Cardiac Rehabilitation - Phase II   Complete by: As directed    Diagnosis:  NSTEMI Coronary Stents     After initial  evaluation and assessments completed: Virtual Based Care may be provided alone or in conjunction with Phase 2 Cardiac Rehab based on patient barriers.: Yes   Diet - low sodium heart healthy   Complete by: As directed    Discharge instructions   Complete by: As directed    Follow-up with cardiologist as scheduled.   Increase activity slowly   Complete by: As directed      Allergies  as of 08/19/2019      Reactions   Corticosteroids    Other reaction(s): Headache   Other Other (See Comments)   Sulfa Antibiotics Anaphylaxis   Clarithromycin    Other reaction(s): Other (See Comments) Rectal bleeding   Cymbalta [duloxetine Hcl] Diarrhea   Prednisone Other (See Comments)   Color changes in face, headache and difficulty walking      Medication List    STOP taking these medications   diclofenac 75 MG EC tablet Commonly known as: VOLTAREN   diclofenac sodium 1 % Gel Commonly known as: VOLTAREN   simvastatin 40 MG tablet Commonly known as: ZOCOR     TAKE these medications   amiodarone 200 MG tablet Commonly known as: PACERONE Take 1 tablet (200 mg total) by mouth daily. Start taking on: August 20, 2019   aspirin 81 MG EC tablet Take 1 tablet (81 mg total) by mouth daily. Swallow whole.   atorvastatin 80 MG tablet Commonly known as: LIPITOR Take 1 tablet (80 mg total) by mouth daily.   calcium citrate-vitamin D 315-200 MG-UNIT tablet Commonly known as: CITRACAL+D Take 1 tablet by mouth 2 (two) times daily.   CHLOR-TABLETS PO Take 1 tablet by mouth as needed.   desonide 0.05 % ointment Commonly known as: DESOWEN desonide 0.05 % topical ointment   ESTER C PO Take 1,000 mg by mouth daily.   metoprolol succinate 50 MG 24 hr tablet Commonly known as: TOPROL-XL Take 50 mg by mouth daily.   multivitamin with minerals tablet Take 1 tablet by mouth daily.   PRILOSEC PO Take 1 tablet by mouth daily.   ticagrelor 90 MG Tabs tablet Commonly known as: BRILINTA Take 1  tablet (90 mg total) by mouth 2 (two) times daily.   Vitamin D-1000 Max St 25 MCG (1000 UT) tablet Generic drug: Cholecalciferol Take 3,000 Units by mouth daily.       Follow-up Information    Wellington Hampshire, MD On 09/02/2019.   Specialty: Cardiology Why: Appointment at 2:30pm Contact information: [place] [id number] [phone]                Time coordinating discharge: 32 minutes  Signed:  Jennye Boroughs  [facility] 08/19/2019, 2:52 PM

## 2019-08-19 NOTE — Interval H&P Note (Signed)
Cath Lab Visit (complete for each Cath Lab visit)  Clinical Evaluation Leading to the Procedure:   ACS: Yes.    Non-ACS:  n/a    History and Physical Interval Note:  08/19/2019 7:54 AM  Cheryl Briggs  has presented today for surgery, with the diagnosis of PCI  LT    Myocardial infarction.  The various methods of treatment have been discussed with the patient and family. After consideration of risks, benefits and other options for treatment, the patient has consented to  Procedure(s): CORONARY STENT INTERVENTION (Left) as a surgical intervention.  The patient's history has been reviewed, patient examined, no change in status, stable for surgery.  I have reviewed the patient's chart and labs.  Questions were answered to the patient's satisfaction.     Kathlyn [place]

## 2019-08-19 NOTE — Progress Notes (Signed)
Patient has developed large hematoma in right arm.  Dr. Fletcher Anon paged and came to bedside.  Blood pressure cuff put on patient to reduce size of hematoma.  Pulses intact, VSS.  Hemoglobin ordered.

## 2019-08-20 DIAGNOSIS — I219 Acute myocardial infarction, unspecified: Secondary | ICD-10-CM

## 2019-08-20 DIAGNOSIS — T148XXA Other injury of unspecified body region, initial encounter: Secondary | ICD-10-CM

## 2019-08-20 DIAGNOSIS — I200 Unstable angina: Secondary | ICD-10-CM

## 2019-08-20 DIAGNOSIS — I10 Essential (primary) hypertension: Secondary | ICD-10-CM

## 2019-08-20 LAB — BASIC METABOLIC PANEL
Anion gap: 10 (ref 5–15)
BUN: 9 mg/dL (ref 8–23)
CO2: 21 mmol/L — ABNORMAL LOW (ref 22–32)
Calcium: 8.9 mg/dL (ref 8.9–10.3)
Chloride: 107 mmol/L (ref 98–111)
Creatinine, Ser: 0.69 mg/dL (ref 0.44–1.00)
GFR calc Af Amer: >60 mL/min (ref >60)
GFR calc non Af Amer: >60 mL/min (ref >60)
Glucose, Bld: 102 mg/dL — ABNORMAL HIGH (ref 70–99)
Potassium: 3.4 mmol/L — ABNORMAL LOW (ref 3.5–5.1)
Sodium: 138 mmol/L (ref 135–145)

## 2019-08-20 LAB — CBC
HCT: 39.2 % (ref 36.0–46.0)
Hemoglobin: 13.1 g/dL (ref 12.0–15.0)
MCH: 32.7 pg (ref 26.0–34.0)
MCHC: 33.4 g/dL (ref 30.0–36.0)
MCV: 97.8 fL (ref 80.0–100.0)
Platelets: 288 10*3/uL (ref 150–400)
RBC: 4.01 MIL/uL (ref 3.87–5.11)
RDW: 13 % (ref 11.5–15.5)
WBC: 14 10*3/uL — ABNORMAL HIGH (ref 4.0–10.5)
nRBC: 0 % (ref 0.0–0.2)

## 2019-08-20 MED ORDER — METOPROLOL SUCCINATE ER 100 MG PO TB24
100.0000 mg | ORAL_TABLET | Freq: Every day | ORAL | Status: DC
  Administered 2019-08-20 – 2019-08-22 (×3): 100 mg via ORAL
  Filled 2019-08-20 (×3): qty 1

## 2019-08-20 MED ORDER — SODIUM CHLORIDE 0.9 % IV SOLN: INTRAVENOUS | Status: DC

## 2019-08-20 MED ORDER — METOPROLOL SUCCINATE ER 50 MG PO TB24: 75.0000 mg | ORAL_TABLET | Freq: Every day | ORAL | 0 refills | Status: DC

## 2019-08-20 MED ORDER — POTASSIUM CHLORIDE CRYS ER 20 MEQ PO TBCR
40.0000 meq | EXTENDED_RELEASE_TABLET | Freq: Once | ORAL | Status: AC
  Administered 2019-08-20: 40 meq via ORAL
  Filled 2019-08-20: qty 2

## 2019-08-20 MED ORDER — METOPROLOL SUCCINATE ER 100 MG PO TB24: 100.0000 mg | ORAL_TABLET | Freq: Every day | ORAL | 0 refills | Status: DC

## 2019-08-20 NOTE — Consult Note (Signed)
[facility] Consult Note  MRN : [id number]  Cheryl Briggs is a 74 y.o. (Jul 09, 1945) female who presents with chief complaint of  Chief Complaint  Patient presents with  . Chest Pain   History of Present Illness: Cheryl Briggs is a 74 year old female with medical history significant for CLL who presents to the emergency room for evaluation of chest pain which she described as midsternal chest pressure, feeling like something was sitting on her chest.  She rated her pain an 8 x 10 intensity at its worst.  There was no radiation of the pain but was associated with diaphoresis, nausea, vomiting and shortness of breath.   She denies having any fever, chills, abdominal pain, no changes in her bowel habits, no urinary symptoms.  She underwent a cardiac catheterization on Friday and a second one yesterday both via the right radial artery.  The patient developed a hematoma proximal to the arteriotomy site.  Treated with compression wrap.  Patient notes that her arm is "softer" and has less discomfort today however her cardiac team noted the recurrence of the hematoma when compression was removed.  Vascular surgery was consulted by Dr. Saunders Revel for possible endovascular intervention.   Current Facility-Administered Medications  Medication Dose Route Frequency Provider Last Rate Last Admin  . 0.9 %  sodium chloride infusion  250 mL Intravenous PRN Kathlyn Sacramento A, MD      . acetaminophen (TYLENOL) tablet 650 mg  650 mg Oral Q4H PRN Wellington Hampshire, MD   650 mg at 08/20/19 1401  . aspirin EC tablet 81 mg  81 mg Oral Daily Kathlyn Sacramento A, MD   81 mg at 08/20/19 0949  . atorvastatin (LIPITOR) tablet 80 mg  80 mg Oral Daily Kathlyn Sacramento A, MD   80 mg at 08/20/19 0949  . calcium-vitamin D (OSCAL WITH D) 500-200 MG-UNIT per tablet 1 tablet  1 tablet Oral BID WC Wellington Hampshire, MD   1 tablet at 08/20/19 0949  . Chlorhexidine Gluconate Cloth 2 % PADS 6 each  6  each Topical Daily Wellington Hampshire, MD   6 each at 08/17/19 1200  . cholecalciferol (VITAMIN D) tablet 3,000 Units  3,000 Units Oral Daily Wellington Hampshire, MD   3,000 Units at 08/20/19 0949  . heparin injection 5,000 Units  5,000 Units Subcutaneous Q8H Wellington Hampshire, MD   5,000 Units at 08/20/19 0521  . metoCLOPramide (REGLAN) injection 5 mg  5 mg Intravenous Q8H Arida, Muhammad A, MD   5 mg at 08/20/19 1401  . metoprolol succinate (TOPROL-XL) 24 hr tablet 100 mg  100 mg Oral Daily Christell Faith M, PA-C   100 mg at 08/20/19 0949  . multivitamin with minerals tablet 1 tablet  1 tablet Oral Daily Wellington Hampshire, MD   1 tablet at 08/20/19 0949  . mupirocin ointment (BACTROBAN) 2 % 1 application  1 application Nasal BID Wellington Hampshire, MD   1 application at 07/10/14 0950  . nitroGLYCERIN (NITROSTAT) SL tablet 0.4 mg  0.4 mg Sublingual Q5 Min x 3 PRN Kathlyn Sacramento A, MD      . ondansetron (ZOFRAN) injection 4 mg  4 mg Intravenous Q6H PRN Wellington Hampshire, MD   4 mg at 08/16/19 1153  . pantoprazole (PROTONIX) EC tablet 40 mg  40 mg Oral Daily Kathlyn Sacramento A, MD   40 mg at 08/20/19 0949  . sodium chloride flush (NS) 0.9 % injection 3 mL  3 mL Intravenous Once Kathlyn Sacramento A, MD      . sodium chloride flush (NS) 0.9 % injection 3 mL  3 mL Intravenous Q12H Wellington Hampshire, MD   3 mL at 08/20/19 0950  . sodium chloride flush (NS) 0.9 % injection 3 mL  3 mL Intravenous Q12H Wellington Hampshire, MD   3 mL at 08/20/19 0950  . sodium chloride flush (NS) 0.9 % injection 3 mL  3 mL Intravenous PRN Wellington Hampshire, MD      . ticagrelor (BRILINTA) tablet 90 mg  90 mg Oral BID Wellington Hampshire, MD   90 mg at 08/20/19 1497   Past Medical History:  Diagnosis Date  . Allergy   . Arthritis   . Cancer ([place])    CLL   Past Surgical History:  Procedure Laterality Date  . BACK SURGERY    . BLADDER REPAIR    . carpel tunnell Bilateral   . CHOLECYSTECTOMY    . COLONOSCOPY  2015  . CORONARY  STENT INTERVENTION N/A 08/16/2019   Procedure: CORONARY STENT INTERVENTION;  Surgeon: Nelva Bush, MD;  Location: [facility];  Service: Cardiovascular;  Laterality: N/A;  . CORONARY STENT INTERVENTION Left 08/19/2019   Procedure: CORONARY STENT INTERVENTION;  Surgeon: Wellington Hampshire, MD;  Location: [facility];  Service: Cardiovascular;  Laterality: Left;  . LEFT HEART CATH AND CORONARY ANGIOGRAPHY N/A 08/16/2019   Procedure: LEFT HEART CATH AND CORONARY ANGIOGRAPHY;  Surgeon: Nelva Bush, MD;  Location: [facility];  Service: Cardiovascular;  Laterality: N/A;  . SHOULDER SURGERY Bilateral    Social History Social History   Tobacco Use  . Smoking status: Never Smoker  . Smokeless tobacco: Never Used  Substance Use Topics  . Alcohol use: No  . Drug use: No   Family History Family History  Problem Relation Age of Onset  . Breast cancer Neg Hx   Denies family history of peripheral artery disease, venous disease or renal disease.  Allergies  Allergen Reactions  . Corticosteroids     Other reaction(s): Headache  . Other Other (See Comments)  . Sulfa Antibiotics Anaphylaxis  . Clarithromycin     Other reaction(s): Other (See Comments) Rectal bleeding   . Cymbalta [Duloxetine Hcl] Diarrhea  . Prednisone Other (See Comments)    Color changes in face, headache and difficulty walking   REVIEW OF SYSTEMS (Negative unless checked)  Constitutional: [] Weight loss  [] Fever  [] Chills Cardiac: [] Chest pain   [] Chest pressure   [] Palpitations   [] Shortness of breath when laying flat   [] Shortness of breath at rest   [] Shortness of breath with exertion. Vascular:  [] Pain in legs with walking   [] Pain in legs at rest   [] Pain in legs when laying flat   [] Claudication   [] Pain in feet when walking  [] Pain in feet at rest  [] Pain in feet when laying flat   [] History of DVT   [] Phlebitis   [] Swelling in legs   [] Varicose veins   [] Non-healing ulcers Pulmonary:    [] Uses home oxygen   [] Productive cough   [] Hemoptysis   [] Wheeze  [] COPD   [] Asthma Neurologic:  [] Dizziness  [] Blackouts   [] Seizures   [] History of stroke   [] History of TIA  [] Aphasia   [] Temporary blindness   [] Dysphagia   [] Weakness or numbness in arms   [] Weakness or numbness in legs Musculoskeletal:  [] Arthritis   [] Joint swelling   [] Joint pain   []   Low back pain Hematologic:  [] Easy bruising  [] Easy bleeding   [] Hypercoagulable state   [] Anemic  [] Hepatitis Gastrointestinal:  [] Blood in stool   [] Vomiting blood  [] Gastroesophageal reflux/heartburn   [] Difficulty swallowing. Genitourinary:  [] Chronic kidney disease   [] Difficult urination  [] Frequent urination  [] Burning with urination   [] Blood in urine Skin:  [] Rashes   [] Ulcers   [] Wounds Psychological:  [] History of anxiety   []  History of major depression.  Positive for right arm hematoma  Physical Examination  Vitals:   08/19/19 1933 08/20/19 0442 08/20/19 0808 08/20/19 1116  BP: (!) 151/72 (!) 151/62 (!) 151/71 122/69  Pulse: 81 77 80 78  Resp:   18 18  Temp: 98.4 F (36.9 C) 98.7 F (37.1 C) 98.3 F (36.8 C) 98.5 F (36.9 C)  TempSrc: Oral Oral  Oral  SpO2: 96% 97% 94% 95%  Weight:  69 kg    Height:       Body mass index is 26.95 kg/m. Gen:  WD/WN, NAD Head: [place]/AT, No temporalis wasting. Prominent temp pulse not noted. Ear/Nose/Throat: Hearing grossly intact, nares w/o erythema or drainage, oropharynx w/o Erythema/Exudate Eyes: Sclera non-icteric, conjunctiva clear Neck: Trachea midline.  No JVD.  Pulmonary:  Good air movement, respirations not labored, equal bilaterally.  Cardiac: RRR, normal S1, S2. Vascular:  Vessel Right Left  Radial Palpable Palpable  Ulnar Palpable Palpable  Brachial Palpable Palpable  Carotid Palpable, without bruit Palpable, without bruit  Aorta Not palpable N/A  Femoral Palpable Palpable  Popliteal Palpable Palpable  PT Palpable Palpable  DP Palpable Palpable   Right upper  extremity: Upper arm soft / lower arm soft.  Compression dressing intact clean and dry.  And some edema to hand however is warm.  Motor/sensory is intact.  Gastrointestinal: soft, non-tender/non-distended. No guarding/reflex.  Musculoskeletal: M/S 5/5 throughout.  Extremities without ischemic changes.  No deformity or atrophy. No edema. Neurologic: Sensation grossly intact in extremities.  Symmetrical.  Speech is fluent. Motor exam as listed above. Psychiatric: Judgment intact, Mood & affect appropriate for pt's clinical situation. Dermatologic: No rashes or ulcers noted.  No cellulitis or open wounds. Lymph : No Cervical, Axillary, or Inguinal lymphadenopathy.  CBC Lab Results  Component Value Date   WBC 14.0 (H) 08/20/2019   HGB 13.1 08/20/2019   HCT 39.2 08/20/2019   MCV 97.8 08/20/2019   PLT 288 08/20/2019   BMET    Component Value Date/Time   NA 138 08/20/2019 0504   K 3.4 (L) 08/20/2019 0504   CL 107 08/20/2019 0504   CO2 21 (L) 08/20/2019 0504   GLUCOSE 102 (H) 08/20/2019 0504   BUN 9 08/20/2019 0504   CREATININE 0.69 08/20/2019 0504   CALCIUM 8.9 08/20/2019 0504   GFRNONAA >60 08/20/2019 0504   GFRAA >60 08/20/2019 0504   Estimated Creatinine Clearance: 56.6 mL/min (by C-G formula based on SCr of 0.69 mg/dL).  COAG Lab Results  Component Value Date   INR 1.0 08/15/2019   Radiology DG Chest 2 View  Result Date: 08/15/2019 CLINICAL DATA:  Chest pain EXAM: CHEST - 2 VIEW COMPARISON:  10/06/2017 FINDINGS: Heart is normal size. Linear scarring at the left base. Right lung clear. No effusions or acute bony abnormality. Degenerative changes in the shoulders and thoracic spine. Leftward scoliosis in the thoracolumbar spine. IMPRESSION: Chronic changes.  No active disease. Electronically Signed   By: Rolm Baptise M.D.   On: 08/15/2019 11:12   CARDIAC CATHETERIZATION  Result Date: 08/19/2019  2nd Diag lesion  is 80% stenosed.  1st Mrg lesion is 100% stenosed.  Prox Cx to  Mid Cx lesion is 60% stenosed.  Dist Cx lesion is 80% stenosed.  Mid RCA lesion is 55% stenosed.  RPAV lesion is 10% stenosed.  Balloon angioplasty was performed.  Previously placed Dist RCA drug-eluting stents are widely patent with 0% stenosed side branch in RPDA.  Post intervention, there is a 0% residual stenosis.  A drug-eluting stent was successfully placed using a STENT RESOLUTE ONYX 2.75X18.  1.  Widely patent RCA stent with normal flow distally.  Unchanged left coronary artery disease. 2.  Mildly elevated left ventricular end-diastolic pressure at 14 mmHg. 3.  Successful direct stenting of the distal left circumflex. Recommendations: Continue dual antiplatelet therapy for 1 year. Aggressive treatment of risk factors. If the patient remains stable, she can potentially be discharged home in the afternoon.   CARDIAC CATHETERIZATION  Result Date: 08/16/2019  A drug-eluting stent was successfully placed using a STENT SYNERGY DES 2.25X20.  A stent was successfully placed.  Conclusions: 1. 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 2. Moderately elevated left ventricular filling pressure. 3. Complex PCI to distal RCA, rPDA, and rPLA using Synergy 2.25 x 20 mm drug-eluting stent extending from the distal RCA into the rPDA, as well as angioplasty of the ostial rPL branch.  Intervention was complicated by no-reflow with transient hypotension/bradycardia and subsequent development of atrial fibrillation with rapid ventricular response.  Final angiogram shows 0% residual stenosis in the distal RCA/rPDA and 10% residual stenosis at ostial rPL with TIMI-2 flow. Recommendations: 1. Transfer to ICU for aggressive medical therapy and post-PCI monitoring. 2. Continue tirofiban infusion for 18 hours. 3. Dual  antiplatelet therapy with aspirin and ticagrelor for at least 12 months. 4. Titrate nitroglycerine infusion for relief of chest pain. 5. Continue amiodarone infusion for rate control and hopefully conversion to sinus rhythm.  If patient remains in atrial fibrillation tomorrow, IV heparin should be started (will defer heparin at this time given tirofiban infusion). 6. Aggressive secondary prevention. 7. Anticipate relook catheterization on Monday with PCI to the LCx.  I favor medical therapy for D2, given relatively small vessel size and need to stent back to the LAD, were intervention undertaken. Nelva Bush, MD [facility]  ECHOCARDIOGRAM COMPLETE  Result Date: 08/15/2019    ECHOCARDIOGRAM REPORT   Patient Name:   Cheryl Briggs Date of Exam: 08/15/2019 Medical Rec #:  [id number]         Height:       62.0 in Accession #:    [id number]        Weight:       158.1 lb Date of Birth:  05-Sep-1945         BSA:          1.730 m Patient Age:    74 years          BP:           137/60 mmHg Patient Gender: F                 HR:           75 bpm. Exam Location:  [facility] Procedure: 2D Echo, Cardiac Doppler and Color Doppler Indications:     NSTEMI 121.4  History:  Patient has no prior history of Echocardiogram examinations. No                  cardiac history listed in chart.  Sonographer:     Sherrie Sport RDCS (AE) Referring Phys:  [place] Diagnosing Phys: Kathlyn Sacramento MD IMPRESSIONS  1. Left ventricular ejection fraction, by estimation, is 55 to 60%. The left ventricle has normal function. The left ventricle has no regional wall motion abnormalities. There is mild left ventricular hypertrophy. Left ventricular diastolic parameters were normal.  2. Right ventricular systolic function is normal. The right ventricular size is normal. There is normal pulmonary artery systolic pressure.  3. The mitral valve is normal in structure. Mild mitral valve regurgitation. No evidence of mitral stenosis.  4. The  aortic valve is abnormal. Aortic valve regurgitation is not visualized. Mild to moderate aortic valve sclerosis/calcification is present, without any evidence of aortic stenosis. FINDINGS  Left Ventricle: Left ventricular ejection fraction, by estimation, is 55 to 60%. The left ventricle has normal function. The left ventricle has no regional wall motion abnormalities. The left ventricular internal cavity size was normal in size. There is  mild left ventricular hypertrophy. Left ventricular diastolic parameters were normal. Right Ventricle: The right ventricular size is normal. No increase in right ventricular wall thickness. Right ventricular systolic function is normal. There is normal pulmonary artery systolic pressure. The tricuspid regurgitant velocity is 2.17 m/s, and  with an assumed right atrial pressure of 10 mmHg, the estimated right ventricular systolic pressure is 94.8 mmHg. Left Atrium: Left atrial size was normal in size. Right Atrium: Right atrial size was normal in size. Pericardium: There is no evidence of pericardial effusion. Mitral Valve: The mitral valve is normal in structure. Normal mobility of the mitral valve leaflets. Mild mitral valve regurgitation. No evidence of mitral valve stenosis. Tricuspid Valve: The tricuspid valve is normal in structure. Tricuspid valve regurgitation is trivial. No evidence of tricuspid stenosis. Aortic Valve: The aortic valve is abnormal. Aortic valve regurgitation is not visualized. Mild to moderate aortic valve sclerosis/calcification is present, without any evidence of aortic stenosis. Aortic valve mean gradient measures 4.0 mmHg. Aortic valve peak gradient measures 7.4 mmHg. Aortic valve area, by VTI measures 2.29 cm. Pulmonic Valve: The pulmonic valve was normal in structure. Pulmonic valve regurgitation is not visualized. No evidence of pulmonic stenosis. Aorta: The aortic root is normal in size and structure. Venous: The inferior vena cava was not well  visualized. IAS/Shunts: No atrial level shunt detected by color flow Doppler.  LEFT VENTRICLE PLAX 2D LVIDd:         4.34 cm  Diastology LVIDs:         2.87 cm  LV e' lateral:   7.29 cm/s LV PW:         1.28 cm  LV E/e' lateral: 12.7 LV IVS:        0.91 cm  LV e' medial:    6.42 cm/s LVOT diam:     2.00 cm  LV E/e' medial:  14.4 LV SV:         64 LV SV Index:   37 LVOT Area:     3.14 cm  RIGHT VENTRICLE RV Basal diam:  2.02 cm RV S prime:     12.20 cm/s TAPSE (M-mode): 3.5 cm LEFT ATRIUM             Index       RIGHT ATRIUM  Index LA diam:        3.30 cm 1.91 cm/m  RA Area:     11.00 cm LA Vol (A2C):   21.2 ml 12.26 ml/m RA Volume:   24.30 ml  14.05 ml/m LA Vol (A4C):   32.3 ml 18.67 ml/m LA Biplane Vol: 26.7 ml 15.44 ml/m  AORTIC VALVE                   PULMONIC VALVE AV Area (Vmax):    1.72 cm    PV Vmax:        0.61 m/s AV Area (Vmean):   1.56 cm    PV Peak grad:   1.5 mmHg AV Area (VTI):     2.29 cm    RVOT Peak grad: 2 mmHg AV Vmax:           136.33 cm/s AV Vmean:          92.233 cm/s AV VTI:            0.280 m AV Peak Grad:      7.4 mmHg AV Mean Grad:      4.0 mmHg LVOT Vmax:         74.60 cm/s LVOT Vmean:        45.900 cm/s LVOT VTI:          0.204 m LVOT/AV VTI ratio: 0.73  AORTA Ao Root diam: 2.70 cm MITRAL VALVE               TRICUSPID VALVE MV Area (PHT): 3.68 cm    TR Peak grad:   18.8 mmHg MV Decel Time: 206 msec    TR Vmax:        217.00 cm/s MV E velocity: 92.60 cm/s MV A velocity: 89.20 cm/s  SHUNTS MV E/A ratio:  1.04        Systemic VTI:  0.20 m                            Systemic Diam: 2.00 cm Kathlyn Sacramento MD Electronically signed by Kathlyn Sacramento MD Signature Date/Time: 08/15/2019/4:07:35 PM    Final    ECHOCARDIOGRAM LIMITED  Result Date: 08/17/2019    ECHOCARDIOGRAM LIMITED REPORT   Patient Name:   Cheryl Briggs Date of Exam: 08/17/2019 Medical Rec #:  [id number]         Height:       62.0 in Accession #:    [id number]        Weight:       153.0 lb Date of Birth:   [birth year]         BSA:          1.706 m Patient Age:    35 years          BP:           140/58 mmHg Patient Gender: F                 HR:           95 bpm. Exam Location:  [facility] Procedure: 2D Echo, Limited Echo, Cardiac Doppler and Color Doppler Indications:     Acute myocardial infarction 410  History:         Patient has prior history of Echocardiogram examinations.  Sonographer:     Alyse Low Roar Referring Phys:  [place] Diagnosing Phys: Skeet Latch MD IMPRESSIONS  1. Left ventricular ejection fraction, by estimation,  is 60 to 65%. The left ventricle has normal function. The left ventricle has no regional wall motion abnormalities. There is mild concentric left ventricular hypertrophy. Left ventricular diastolic parameters are consistent with Grade I diastolic dysfunction (impaired relaxation). Elevated left ventricular end-diastolic pressure.  2. Right ventricular systolic function is normal. The right ventricular size is normal.  3. The mitral valve is normal in structure. Trivial mitral valve regurgitation. No evidence of mitral stenosis.  4. The aortic valve is tricuspid. Aortic valve regurgitation is not visualized. No aortic stenosis is present. FINDINGS  Left Ventricle: Left ventricular ejection fraction, by estimation, is 60 to 65%. The left ventricle has normal function. The left ventricle has no regional wall motion abnormalities. The left ventricular internal cavity size was normal in size. There is  mild concentric left ventricular hypertrophy. Elevated left ventricular end-diastolic pressure. Right Ventricle: The right ventricular size is normal. No increase in right ventricular wall thickness. Right ventricular systolic function is normal. Left Atrium: Left atrial size was normal in size. Right Atrium: Right atrial size was normal in size. Pericardium: Trivial pericardial effusion is present. Mitral Valve: The mitral valve is normal in structure. Normal mobility of the mitral valve  leaflets. Trivial mitral valve regurgitation. No evidence of mitral valve stenosis. Tricuspid Valve: The tricuspid valve is normal in structure. Tricuspid valve regurgitation is trivial. No evidence of tricuspid stenosis. Aortic Valve: The aortic valve is tricuspid. Aortic valve regurgitation is not visualized. No aortic stenosis is present. Aortic valve peak gradient measures 13.1 mmHg. Pulmonic Valve: The pulmonic valve was normal in structure. Pulmonic valve regurgitation is not visualized. No evidence of pulmonic stenosis. Aorta: The aortic root is normal in size and structure. Venous: The inferior vena cava was not well visualized. IAS/Shunts: No atrial level shunt detected by color flow Doppler. LEFT VENTRICLE PLAX 2D LVIDd:         3.64 cm  Diastology LVIDs:         2.46 cm  LV e' lateral:   7.51 cm/s LV PW:         1.04 cm  LV E/e' lateral: 11.8 LV IVS:        1.21 cm  LV e' medial:    4.35 cm/s LVOT diam:     1.70 cm  LV E/e' medial:  20.4 LVOT Area:     2.27 cm  RIGHT VENTRICLE RV Mid diam:    2.95 cm RV S prime:     14.90 cm/s TAPSE (M-mode): 2.0 cm LEFT ATRIUM             Index       RIGHT ATRIUM          Index LA diam:        3.50 cm 2.05 cm/m  RA Area:     9.52 cm LA Vol (A2C):   32.5 ml 19.05 ml/m RA Volume:   19.70 ml 11.55 ml/m LA Vol (A4C):   26.2 ml 15.36 ml/m LA Biplane Vol: 29.1 ml 17.06 ml/m  AORTIC VALVE                PULMONIC VALVE AV Area (Vmax): 1.30 cm    PV Vmax:        1.07 m/s AV Vmax:        181.00 cm/s PV Peak grad:   4.6 mmHg AV Peak Grad:   13.1 mmHg   RVOT Peak grad: 2 mmHg LVOT Vmax:      104.00 cm/s  AORTA  Ao Root diam: 2.50 cm MITRAL VALVE                TRICUSPID VALVE MV Area (PHT): 4.21 cm     TR Peak grad:   29.2 mmHg MV Decel Time: 180 msec     TR Vmax:        270.00 cm/s MV E velocity: 88.80 cm/s MV A velocity: 108.00 cm/s  SHUNTS MV E/A ratio:  0.82         Systemic Diam: 1.70 cm MV A Prime:    11.9 cm/s Skeet Latch MD Electronically signed by Skeet Latch MD Signature Date/Time: 08/17/2019/2:21:07 PM    Final    Assessment/Plan The patient is a 74 year old female that is post cardiac catheterization x2 both via the right radial artery now with hematoma.  1.  Right Forearm Hematoma: Patient status post cardiac catheterization x2 via the radial artery now with hematoma proximal to arteriotomy site.  Currently, hematoma is controlled with compression dressing however when dressing is removed hematoma will recur.  There is no acute vascular compromise noted to the right extremity at this time and the patient's hemoglobin is relatively stable.  Recommend compression and elevation.  Will reassess in the morning.  If hematoma reoccurs we will plan on a right upper extremity angiogram with possible stenting noted the injured area.  Plan discussed in detail with the patient and her family member at bedside.  All questions answered.  If the hematoma occurs in the a.m. patient is willing to proceed.  Will make NPO after midnight.  2. NSTEMI: s/p cardiac catherization x2 via radial artery On ASA, Brilinta, statin for medical management Asymptomatic at this time  3. CLL Continued care with oncologist  Discussed with Dr. Mayme Genta, PA-C  08/20/2019 4:09 PM  This note was created with Dragon medical transcription system.  Any error is purely unintentional

## 2019-08-20 NOTE — Progress Notes (Addendum)
Progress Note    Cheryl Briggs  JKK:[id number] DOB: [birth year]  DOA: 08/15/2019 PCP: Joyice Faster, FNP      Brief Narrative:    Medical records reviewed and are as summarized below:  Cheryl Briggs is a 74 y.o. female with medical history significant for hyperlipidemia, CLL, who presented to the hospital with midsternal chest pain.  Her troponins were elevated and she was admitted to the hospital for acute NSTEMI.  She was seen in consultation by the cardiologist and she underwent left heart catheterization. "  Complex PCI to distal RCA, rPDA and rPLA with drug-eluting stent was performed.  During PCI, she developed transient bradycardia and hypotension and subsequently developed tachycardia and hypotension after receiving atropine and norepinephrine. She then developed atrial fibrillation with RVR requiring IV amiodarone infusion.  She underwent repeat left heart cath on 08/19/2019.  Drug-eluting stent was successfully placed in the distal left circumflex.    Unfortunately, she developed right forearm hematoma secondary to PCI via right radial artery access.   Assessment/Plan:   Principal Problem:   NSTEMI (non-ST elevated myocardial infarction) ([place]) Active Problems:   CLL (chronic lymphocytic leukemia) (HCC)   Atrial fibrillation with RVR (HCC)   Hematoma   Acute NSTEMI s/p PCI with placement of drug-eluting stent to distal RCA and distal left circumflex: Continue aspirin, Brilinta, Lipitor and metoprolol.  Right forearm hematoma: Complication from left heart catheterization via right radial artery access.  Hematoma recurred after compression dressing was removed.  Continue compression dressing.  Patient was evaluated by vascular surgeon today.  Continue to monitor.  Plan for possible angiogram and stent placement to the injured area if there is no improvement.   Discontinue prophylactic heparin  Atrial fibrillation with RVR: Converted to normal sinus rhythm.   Amiodarone has been discontinued by cardiologist.    Acute diastolic heart failure: Resolved.  She was given IV Lasix 40 mg x 1 dose during PCI.  Per cardiologist, LVEDP  was moderately elevated at the time of catheterization.  2D echo showed EF estimated at 55 to 60%, mild LVH.  Hyperlipidemia: Continue Lipitor  Hypotension and bradycardia during PCI : Resolved.  She required IV atropine and norepinephrine.    CLL/leukocytosis: Outpatient follow-up with oncologist.    Body mass index is 26.95 kg/m.  Diet Order            Diet NPO time specified  Diet effective midnight           Diet Heart Room service appropriate? Yes; Fluid consistency: Thin  Diet effective now           Diet - low sodium heart healthy                       Medications:   . aspirin EC  81 mg Oral Daily  . atorvastatin  80 mg Oral Daily  . calcium-vitamin D  1 tablet Oral BID WC  . Chlorhexidine Gluconate Cloth  6 each Topical Daily  . cholecalciferol  3,000 Units Oral Daily  . metoCLOPramide (REGLAN) injection  5 mg Intravenous Q8H  . metoprolol succinate  100 mg Oral Daily  . multivitamin with minerals  1 tablet Oral Daily  . mupirocin ointment  1 application Nasal BID  . pantoprazole  40 mg Oral Daily  . sodium chloride flush  3 mL Intravenous Once  . sodium chloride flush  3 mL Intravenous Q12H  . sodium chloride flush  3 mL Intravenous  Q12H  . ticagrelor  90 mg Oral BID   Continuous Infusions: . sodium chloride    . [START ON 08/21/2019] sodium chloride       Anti-infectives (From admission, onward)   Start     Dose/Rate Route Frequency Ordered Stop   08/15/19 1600  cefTRIAXone (ROCEPHIN) 2 g in sodium chloride 0.9 % 100 mL IVPB  Status:  Discontinued        2 g 200 mL/hr over 30 Minutes Intravenous Every 24 hours 08/15/19 1557 08/15/19 1558             Family Communication/Anticipated D/C date and plan/Code Status   DVT prophylaxis: Place and maintain sequential  compression device Start: 08/20/19 1711     Code Status: Full Code  Family Communication: Plan discussed with her daughter, Cheryl Briggs, at the bedside Disposition Plan:    Status is: Inpatient  Remains inpatient appropriate because:IV treatments appropriate due to intensity of illness or inability to take PO and Inpatient level of care appropriate due to severity of illness   Dispo: The patient is from: Home              Anticipated d/c is to: Home              Anticipated d/c date is: 1 day              Patient currently is not medically stable to d/c.           Subjective:   Discharge was canceled yesterday because she developed hematoma on the right forearm.  C/o swelling the right forearm.  She only has mild pain in the right forearm.  Objective:    Vitals:   08/20/19 0442 08/20/19 0808 08/20/19 1116 08/20/19 1643  BP: (!) 151/62 (!) 151/71 122/69 (!) 127/55  Pulse: 77 80 78 79  Resp:  18 18 18   Temp: 98.7 F (37.1 C) 98.3 F (36.8 C) 98.5 F (36.9 C) 98.1 F (36.7 C)  TempSrc: Oral  Oral Oral  SpO2: 97% 94% 95% 92%  Weight: 69 kg     Height:       No data found.   Intake/Output Summary (Last 24 hours) at 08/20/2019 1710 Last data filed at 08/20/2019 0950 Gross per 24 hour  Intake 483 ml  Output 900 ml  Net -417 ml   Filed Weights   08/18/19 0419 08/19/19 0534 08/20/19 0442  Weight: 68.5 kg 68 kg 69 kg    Exam:  GEN: No acute distress SKIN: Warm and dry.  No rash. EYES: Anicteric ENT: MMM CV: RRR PULM: No wheezing or rales heard ABD: soft, ND, NT, +BS CNS: AAO x 3, non focal EXT: Swelling and mild tenderness of right forearm.  Compression dressing in place.  She has a good radial pulse on the right wrist.  She is able to move all her fingers.   Data Reviewed:   I have personally reviewed following labs and imaging studies:  Labs: Labs show the following:   Basic Metabolic Panel: Recent Labs  Lab 08/16/19 0508 08/16/19 0508  08/16/19 1756 08/16/19 1756 08/17/19 0732 08/17/19 0732 08/17/19 0741 08/19/19 0621 08/20/19 0504  NA 140  --  137  --  138  --   --  138 138  K 4.7   < > 3.8   < > 3.5   < >  --  3.6 3.4*  CL 106  --  104  --  104  --   --  106 107  CO2 25  --  24  --  22  --   --  23 21*  GLUCOSE 111*  --  168*  --  116*  --   --  108* 102*  BUN 8  --  7*  --  6*  --   --  9 9  CREATININE 0.71  --  0.70  --  0.80  --   --  0.76 0.69  CALCIUM 9.0  --  8.9  --  8.8*  --   --  8.9 8.9  MG  --   --   --   --   --   --  2.1  --   --    < > = values in this interval not displayed.   GFR Estimated Creatinine Clearance: 56.6 mL/min (by C-G formula based on SCr of 0.69 mg/dL). Liver Function Tests: No results for input(s): AST, ALT, ALKPHOS, BILITOT, PROT, ALBUMIN in the last 168 hours. No results for input(s): LIPASE, AMYLASE in the last 168 hours. No results for input(s): AMMONIA in the last 168 hours. Coagulation profile Recent Labs  Lab 08/15/19 1311  INR 1.0    CBC: Recent Labs  Lab 08/16/19 0508 08/16/19 1756 08/17/19 0732 08/19/19 1607 08/20/19 0504  WBC 16.8* 19.5* 18.1* 16.0* 14.0*  HGB 14.2 14.5 13.9 13.6 13.1  HCT 40.8 41.3 42.3 39.4 39.2  MCV 95.1 94.1 98.6 95.4 97.8  PLT 318 306 298 283 288   Cardiac Enzymes: No results for input(s): CKTOTAL, CKMB, CKMBINDEX, TROPONINI in the last 168 hours. BNP (last 3 results) No results for input(s): PROBNP in the last 8760 hours. CBG: Recent Labs  Lab 08/16/19 1205  GLUCAP 151*   D-Dimer: No results for input(s): DDIMER in the last 72 hours. Hgb A1c: No results for input(s): HGBA1C in the last 72 hours. Lipid Profile: No results for input(s): CHOL, HDL, LDLCALC, TRIG, CHOLHDL, LDLDIRECT in the last 72 hours. Thyroid function studies: No results for input(s): TSH, T4TOTAL, T3FREE, THYROIDAB in the last 72 hours.  Invalid input(s): FREET3 Anemia work up: No results for input(s): VITAMINB12, FOLATE, FERRITIN, TIBC, IRON,  RETICCTPCT in the last 72 hours. Sepsis Labs: Recent Labs  Lab 08/16/19 1756 08/17/19 0732 08/19/19 1607 08/20/19 0504  WBC 19.5* 18.1* 16.0* 14.0*    Microbiology Recent Results (from the past 240 hour(s))  SARS Coronavirus 2 by RT PCR (hospital order, performed in [facility] hospital lab) Nasopharyngeal Nasopharyngeal Swab     Status: None   Collection Time: 08/15/19 12:41 PM   Specimen: Nasopharyngeal Swab  Result Value Ref Range Status   SARS Coronavirus 2 NEGATIVE NEGATIVE Final    Comment: (NOTE) SARS-CoV-2 target nucleic acids are NOT DETECTED.  The SARS-CoV-2 RNA is generally detectable in upper and lower respiratory specimens during the acute phase of infection. The lowest concentration of SARS-CoV-2 viral copies this assay can detect is 250 copies / mL. A negative result does not preclude SARS-CoV-2 infection and should not be used as the sole basis for treatment or other patient management decisions.  A negative result may occur with improper specimen collection / handling, submission of specimen other than nasopharyngeal swab, presence of viral mutation(s) within the areas targeted by this assay, and inadequate number of viral copies (<250 copies / mL). A negative result must be combined with clinical observations, patient history, and epidemiological information.  Fact Sheet for Patients:   StrictlyIdeas.no  Fact Sheet for Healthcare Providers: BankingDealers.co.za  This  test is not yet approved or  cleared by the [place] and has been authorized for detection and/or diagnosis of SARS-CoV-2 by FDA under an Emergency Use Authorization (EUA).  This EUA will remain in effect (meaning this test can be used) for the duration of the COVID-19 declaration under Section 564(b)(1) of the Act, 21 U.S.C. section 360bbb-3(b)(1), unless the authorization is terminated or revoked sooner.  Performed at [facility], [address] [place], [place] [address]   Surgical PCR screen     Status: None   Collection Time: 08/16/19 10:25 PM   Specimen: Nasal Mucosa; Nasal Swab  Result Value Ref Range Status   MRSA, PCR NEGATIVE NEGATIVE Final   Staphylococcus aureus NEGATIVE NEGATIVE Final    Comment: (NOTE) The Xpert SA Assay (FDA approved for NASAL specimens in patients 57 years of age and older), is one component of a comprehensive surveillance program. It is not intended to diagnose infection nor to guide or monitor treatment. Performed at [facility], [address] [place], [place] [address]     Procedures and diagnostic studies:  CARDIAC CATHETERIZATION  Result Date: 08/19/2019  2nd Diag lesion is 80% stenosed.  1st Mrg lesion is 100% stenosed.  Prox Cx to Mid Cx lesion is 60% stenosed.  Dist Cx lesion is 80% stenosed.  Mid RCA lesion is 55% stenosed.  RPAV lesion is 10% stenosed.  Balloon angioplasty was performed.  Previously placed Dist RCA drug-eluting stents are widely patent with 0% stenosed side branch in RPDA.  Post intervention, there is a 0% residual stenosis.  A drug-eluting stent was successfully placed using a STENT RESOLUTE ONYX 2.75X18.  1.  Widely patent RCA stent with normal flow distally.  Unchanged left coronary artery disease. 2.  Mildly elevated left ventricular end-diastolic pressure at 14 mmHg. 3.  Successful direct stenting of the distal left circumflex. Recommendations: Continue dual antiplatelet therapy for 1 year. Aggressive treatment of risk factors. If the patient remains stable, she can potentially be discharged home in the afternoon.               LOS: 4 days   [facility]   Pager [phone]. If 7PM-7AM, please contact night-coverage at [email or web address]     08/20/2019, 5:10 PM

## 2019-08-20 NOTE — Progress Notes (Signed)
Progress Note  Patient Name: Cheryl Briggs Date of Encounter: 08/20/2019  Primary Cardiologist: Fletcher Anon  Subjective   No chest pain, dyspnea, palpitations, dizziness, presyncope, or syncope. She underwent repeat LHC 8/9 which showed a widely patent RCA stent with normal flow distally.  She underwent successful PCI to distal LCx without immediate complications.  Post procedure, her discharge was canceled in the afternoon secondary to right forearm hematoma above the puncture site and below the elbow that was enlarging.  There was no evidence of compartment syndrome with normal sensation and normal radial/ulnar pulses.  Manual pressure followed by BP cuff inflated to 80 mmHg around the area along with Koban dressing were undertaken.  With this, she notes improvement in discomfort and significant improvement in swelling.  Post-cath labs stable.  BP mildly elevated in the 419F systolic.  Inpatient Medications    Scheduled Meds: . aspirin EC  81 mg Oral Daily  . atorvastatin  80 mg Oral Daily  . calcium-vitamin D  1 tablet Oral BID WC  . Chlorhexidine Gluconate Cloth  6 each Topical Daily  . cholecalciferol  3,000 Units Oral Daily  . heparin  5,000 Units Subcutaneous Q8H  . metoCLOPramide (REGLAN) injection  5 mg Intravenous Q8H  . metoprolol succinate  100 mg Oral Daily  . multivitamin with minerals  1 tablet Oral Daily  . mupirocin ointment  1 application Nasal BID  . pantoprazole  40 mg Oral Daily  . potassium chloride  40 mEq Oral Once  . sodium chloride flush  3 mL Intravenous Once  . sodium chloride flush  3 mL Intravenous Q12H  . sodium chloride flush  3 mL Intravenous Q12H  . ticagrelor  90 mg Oral BID   Continuous Infusions: . sodium chloride     PRN Meds: sodium chloride, acetaminophen, nitroGLYCERIN, ondansetron (ZOFRAN) IV, sodium chloride flush   Vital Signs    Vitals:   08/19/19 1638 08/19/19 1933 08/20/19 0442 08/20/19 0808  BP: 132/81 (!) 151/72 (!) 151/62  (!) 151/71  Pulse:  81 77 80  Resp:    18  Temp:  98.4 F (36.9 C) 98.7 F (37.1 C) 98.3 F (36.8 C)  TempSrc:  Oral Oral   SpO2:  96% 97% 94%  Weight:   69 kg   Height:        Intake/Output Summary (Last 24 hours) at 08/20/2019 0919 Last data filed at 08/20/2019 7902 Gross per 24 hour  Intake 715 ml  Output 900 ml  Net -185 ml   Filed Weights   08/18/19 0419 08/19/19 0534 08/20/19 0442  Weight: 68.5 kg 68 kg 69 kg    Telemetry    SR - Personally Reviewed  ECG    No new tracings - Personally Reviewed  Physical Exam   GEN: No acute distress.   Neck: No JVD. Cardiac: RRR, no murmurs, rubs, or gallops. Right forearm is wrapped with bandage with improved STS. Radial pulse 2+.  Respiratory: Clear to auscultation bilaterally.  GI: Soft, nontender, non-distended.   MS: No edema; No deformity. Neuro:  Alert and oriented x 3; Nonfocal.  Psych: Normal affect.  Labs    Chemistry Recent Labs  Lab 08/17/19 0732 08/19/19 0621 08/20/19 0504  NA 138 138 138  K 3.5 3.6 3.4*  CL 104 106 107  CO2 22 23 21*  GLUCOSE 116* 108* 102*  BUN 6* 9 9  CREATININE 0.80 0.76 0.69  CALCIUM 8.8* 8.9 8.9  GFRNONAA >60 >60 >60  GFRAA >  60 >60 >60  ANIONGAP 12 9 10      Hematology Recent Labs  Lab 08/17/19 0732 08/19/19 1607 08/20/19 0504  WBC 18.1* 16.0* 14.0*  RBC 4.29 4.13 4.01  HGB 13.9 13.6 13.1  HCT 42.3 39.4 39.2  MCV 98.6 95.4 97.8  MCH 32.4 32.9 32.7  MCHC 32.9 34.5 33.4  RDW 13.2 13.1 13.0  PLT 298 283 288    Cardiac EnzymesNo results for input(s): TROPONINI in the last 168 hours. No results for input(s): TROPIPOC in the last 168 hours.   BNPNo results for input(s): BNP, PROBNP in the last 168 hours.   DDimer No results for input(s): DDIMER in the last 168 hours.   Radiology    CXR 08/15/2019: IMPRESSION: Chronic changes.  No active disease.  Cardiac Studies   2D echo 08/15/2019: 1. Left ventricular ejection fraction, by estimation, is 55 to 60%. The    left ventricle has normal function. The left ventricle has no regional  wall motion abnormalities. There is mild left ventricular hypertrophy.  Left ventricular diastolic parameters  were normal.  2. Right ventricular systolic function is normal. The right ventricular  size is normal. There is normal pulmonary artery systolic pressure.  3. The mitral valve is normal in structure. Mild mitral valve  regurgitation. No evidence of mitral stenosis.  4. The aortic valve is abnormal. Aortic valve regurgitation is not  visualized. Mild to moderate aortic valve sclerosis/calcification is  present, without any evidence of aortic stenosis.  __________   LHC 08/16/2019: A drug-eluting stent was successfully placed using a STENT SYNERGY DES 2.25X20.  A stent was successfully placed.   Conclusions: 1. 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 2. Moderately elevated left ventricular filling pressure. 3. Complex PCI to distal RCA, rPDA, and rPLA using Synergy 2.25 x 20 mm drug-eluting stent extending from the distal RCA into the rPDA, as well as angioplasty of the ostial rPL branch.  Intervention was complicated by no-reflow with transient hypotension/bradycardia and subsequent development of atrial fibrillation with rapid ventricular response.  Final angiogram shows 0% residual stenosis in the distal RCA/rPDA and 10% residual stenosis at ostial rPL with TIMI-2 flow.  Recommendations: 1. Transfer to ICU for aggressive medical therapy and post-PCI monitoring. 2. Continue tirofiban infusion for 18 hours. 3. Dual antiplatelet therapy with aspirin and ticagrelor for at least 12 months. 4. Titrate nitroglycerine infusion for relief of chest pain. 5. Continue amiodarone infusion for rate control and hopefully  conversion to sinus rhythm.  If patient remains in atrial fibrillation tomorrow, IV heparin should be started (will defer heparin at this time given tirofiban infusion). 6. Aggressive secondary prevention. 7. Anticipate relook catheterization on Monday with PCI to the LCx.  I favor medical therapy for D2, given relatively small vessel size and need to stent back to the LAD, were intervention undertaken. __________  Limited 2D echo 08/17/2019: 1. Left ventricular ejection fraction, by estimation, is 60 to 65%. The  left ventricle has normal function. The left ventricle has no regional  wall motion abnormalities. There is mild concentric left ventricular  hypertrophy. Left ventricular diastolic  parameters are consistent with Grade I diastolic dysfunction (impaired  relaxation). Elevated left ventricular end-diastolic pressure.  2. Right ventricular systolic function is normal. The right ventricular  size is normal.  3. The mitral valve is normal  in structure. Trivial mitral valve  regurgitation. No evidence of mitral stenosis.  4. The aortic valve is tricuspid. Aortic valve regurgitation is not  visualized. No aortic stenosis is present. __________  LHC 08/19/2019:  2nd Diag lesion is 80% stenosed.  1st Mrg lesion is 100% stenosed.  Prox Cx to Mid Cx lesion is 60% stenosed.  Dist Cx lesion is 80% stenosed.  Mid RCA lesion is 55% stenosed.  RPAV lesion is 10% stenosed.  Balloon angioplasty was performed.  Previously placed Dist RCA drug-eluting stents are widely patent with 0% stenosed side branch in RPDA.  Post intervention, there is a 0% residual stenosis.  A drug-eluting stent was successfully placed using a STENT RESOLUTE ONYX 2.75X18.   1.  Widely patent RCA stent with normal flow distally.  Unchanged left coronary artery disease. 2.  Mildly elevated left ventricular end-diastolic pressure at 14 mmHg. 3.  Successful direct stenting of the distal left  circumflex.  Recommendations: Continue dual antiplatelet therapy for 1 year. Aggressive treatment of risk factors. If the patient remains stable, she can potentially be discharged home in the afternoon.   Patient Profile     74 y.o. 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 fib with RVR which has subsequently resolved with relook angiography on 8/9 showing a patent RCA stent with successful PCI to the distal LCx with post procedure complicated by right forearm hematoma conservatively managed.  Assessment & Plan    1.  NSTEMI: -Chest pain-free -Status post initial cath on 8/6 with distal RCA bifurcation PCI/DES complicated by no reflow which was successfully treated with intracoronary adenosine followed by transient hypotension and A. fib with RVR which have resolved.  She underwent relook angiography on 8/9 which showed a patent RCA stent and normal flow distally with successful PCI/DES to the distal LCx.  Post procedure this case was complicated by right forearm hematoma conservatively managed as outlined above with significant improvement in swelling and symptoms -Continue DAPT with aspirin and ticagrelor without interruption for at least the next 12 months the importance of this therapy was discussed with the patient in detail -Post-cath instructions discussed in detail including expected ecchymosis of the right forearm -Escalate Toprol-XL for added rate control and BP as outlined below -Atorvastatin -Cardiac rehab  2.  Post PCI A. fib with RVR: -Transient episode felt to be likely secondary to ischemia with no recommendation for [place] at this time -Given no recurrence of atrial arrhythmia amiodarone will be discontinued -Escalate  Toprol-XL to 75 mg daily -Should she have recurrence of atrial arrhythmia [place] will need to be reconsidered -TSH normal this admission  3.  HLD: -LDL 82 this admission on simvastatin PTA which has subsequently been transitioned to atorvastatin 80 mg daily -Follow-up fasting lipid panel and liver function in approximately 8 weeks -If LDL remains above goal of less than 70 at that time recommend addition of Zetia  4.  HTN: -Blood pressure is mildly elevated this morning -Escalate Toprol-XL as outlined above  Dispo: -She may be discharged from the hospital today on current medical therapy -Please ensure she has a Brilinta discount card prior to discharge -Follow-up in our office in 1 to 2 weeks   For questions or updates, please contact [place] Please consult [email or web address] for contact info under  Cardiology/STEMI.    Signed, Christell Faith, PA-C [place] Pager: [phone] 08/20/2019, 9:19 AM

## 2019-08-20 NOTE — Telephone Encounter (Signed)
TOC -still admitted call 08/21/19

## 2019-08-21 ENCOUNTER — Other Ambulatory Visit (INDEPENDENT_AMBULATORY_CARE_PROVIDER_SITE_OTHER): Payer: Self-pay | Admitting: Vascular Surgery

## 2019-08-21 ENCOUNTER — Encounter: Payer: Self-pay | Admitting: Internal Medicine

## 2019-08-21 ENCOUNTER — Encounter
Admission: EM | Disposition: A | Discharge status: Home / Self Care | Payer: Self-pay | Source: Home / Self Care | Attending: Internal Medicine

## 2019-08-21 DIAGNOSIS — I97630 Postprocedural hematoma of a circulatory system organ or structure following a cardiac catheterization: Secondary | ICD-10-CM

## 2019-08-21 HISTORY — PX: UPPER EXTREMITY ANGIOGRAPHY: CATH118270

## 2019-08-21 LAB — CBC
HCT: 38.4 % (ref 36.0–46.0)
Hemoglobin: 12.8 g/dL (ref 12.0–15.0)
MCH: 32.5 pg (ref 26.0–34.0)
MCHC: 33.3 g/dL (ref 30.0–36.0)
MCV: 97.5 fL (ref 80.0–100.0)
Platelets: 298 10*3/uL (ref 150–400)
RBC: 3.94 MIL/uL (ref 3.87–5.11)
RDW: 13 % (ref 11.5–15.5)
WBC: 13.3 10*3/uL — ABNORMAL HIGH (ref 4.0–10.5)
nRBC: 0 % (ref 0.0–0.2)

## 2019-08-21 LAB — BASIC METABOLIC PANEL
Anion gap: 10 (ref 5–15)
BUN: 7 mg/dL — ABNORMAL LOW (ref 8–23)
CO2: 21 mmol/L — ABNORMAL LOW (ref 22–32)
Calcium: 9 mg/dL (ref 8.9–10.3)
Chloride: 107 mmol/L (ref 98–111)
Creatinine, Ser: 0.71 mg/dL (ref 0.44–1.00)
GFR calc Af Amer: >60 mL/min (ref >60)
GFR calc non Af Amer: >60 mL/min (ref >60)
Glucose, Bld: 99 mg/dL (ref 70–99)
Potassium: 3.7 mmol/L (ref 3.5–5.1)
Sodium: 138 mmol/L (ref 135–145)

## 2019-08-21 SURGERY — UPPER EXTREMITY ANGIOGRAPHY
Anesthesia: Moderate Sedation | Laterality: Right

## 2019-08-21 MED ORDER — DIPHENHYDRAMINE HCL 50 MG/ML IJ SOLN: 50.0000 mg | Freq: Once | INTRAMUSCULAR | Status: DC | PRN

## 2019-08-21 MED ORDER — HEPARIN SODIUM (PORCINE) 1000 UNIT/ML IJ SOLN
INTRAMUSCULAR | Status: AC
  Filled 2019-08-21: qty 1

## 2019-08-21 MED ORDER — CEFAZOLIN SODIUM-DEXTROSE 2-4 GM/100ML-% IV SOLN
2.0000 g | Freq: Once | INTRAVENOUS | Status: DC
  Filled 2019-08-21: qty 100

## 2019-08-21 MED ORDER — FENTANYL CITRATE (PF) 100 MCG/2ML IJ SOLN
INTRAMUSCULAR | Status: DC | PRN
  Administered 2019-08-21: 50 ug via INTRAVENOUS

## 2019-08-21 MED ORDER — MIDAZOLAM HCL 2 MG/ML PO SYRP: 8.0000 mg | ORAL_SOLUTION | Freq: Once | ORAL | Status: DC | PRN

## 2019-08-21 MED ORDER — MIDAZOLAM HCL 5 MG/5ML IJ SOLN
INTRAMUSCULAR | Status: AC
  Filled 2019-08-21: qty 5

## 2019-08-21 MED ORDER — IODIXANOL 320 MG/ML IV SOLN
INTRAVENOUS | Status: DC | PRN
  Administered 2019-08-21: 35 mL via INTRA_ARTERIAL

## 2019-08-21 MED ORDER — FENTANYL CITRATE (PF) 100 MCG/2ML IJ SOLN
INTRAMUSCULAR | Status: AC
  Filled 2019-08-21: qty 2

## 2019-08-21 MED ORDER — CEFAZOLIN SODIUM-DEXTROSE 2-4 GM/100ML-% IV SOLN
2.0000 g | Freq: Once | INTRAVENOUS | Status: AC
  Administered 2019-08-21: 2 g via INTRAVENOUS

## 2019-08-21 MED ORDER — HEPARIN SODIUM (PORCINE) 1000 UNIT/ML IJ SOLN
INTRAMUSCULAR | Status: DC | PRN
  Administered 2019-08-21: 4000 [IU] via INTRAVENOUS

## 2019-08-21 MED ORDER — FAMOTIDINE 20 MG PO TABS: 40.0000 mg | ORAL_TABLET | Freq: Once | ORAL | Status: DC | PRN

## 2019-08-21 MED ORDER — SODIUM CHLORIDE 0.9 % IV SOLN: INTRAVENOUS | Status: DC

## 2019-08-21 MED ORDER — MIDAZOLAM HCL 2 MG/2ML IJ SOLN
INTRAMUSCULAR | Status: DC | PRN
  Administered 2019-08-21: 2 mg via INTRAVENOUS

## 2019-08-21 SURGICAL SUPPLY — 11 items
CATH ANGIO 5F PIGTAIL 100CM (CATHETERS) ×2 IMPLANT
CATH BEACON 5 .035 100 H1 TIP (CATHETERS) ×2 IMPLANT
CATH VERT 5FR 125CM (CATHETERS) ×2 IMPLANT
DEVICE STARCLOSE SE CLOSURE (Vascular Products) ×2 IMPLANT
DEVICE TORQUE .025-.038 (MISCELLANEOUS) ×2 IMPLANT
GLIDEWIRE ANGLED SS 035X260CM (WIRE) ×2 IMPLANT
PACK ANGIOGRAPHY (CUSTOM PROCEDURE TRAY) ×2 IMPLANT
SHEATH BRITE TIP 5FRX11 (SHEATH) ×2 IMPLANT
SYR MEDRAD MARK 7 150ML (SYRINGE) ×2 IMPLANT
TUBING CONTRAST HIGH PRESS 72 (TUBING) ×2 IMPLANT
WIRE J 3MM .035X145CM (WIRE) ×2 IMPLANT

## 2019-08-21 NOTE — Progress Notes (Signed)
Progress Note  Patient Name: Cheryl Briggs Date of Encounter: 08/21/2019  [facility] Cardiologist: Dr. Fletcher Anon  Subjective   Denies chest pain, shortness of breath.  Able to comfortably without any symptoms.  Dressing on the incision site in the right arm was changed this morning, bleeding seems to have improved.  Inpatient Medications    Scheduled Meds: . aspirin EC  81 mg Oral Daily  . atorvastatin  80 mg Oral Daily  . calcium-vitamin D  1 tablet Oral BID WC  . Chlorhexidine Gluconate Cloth  6 each Topical Daily  . cholecalciferol  3,000 Units Oral Daily  . metoCLOPramide (REGLAN) injection  5 mg Intravenous Q8H  . metoprolol succinate  100 mg Oral Daily  . multivitamin with minerals  1 tablet Oral Daily  . mupirocin ointment  1 application Nasal BID  . pantoprazole  40 mg Oral Daily  . sodium chloride flush  3 mL Intravenous Once  . sodium chloride flush  3 mL Intravenous Q12H  . sodium chloride flush  3 mL Intravenous Q12H  . ticagrelor  90 mg Oral BID   Continuous Infusions: . sodium chloride    . sodium chloride 75 mL/hr at 08/21/19 0231  . sodium chloride 75 mL/hr at 08/21/19 1145  . [START ON 08/22/2019]  ceFAZolin (ANCEF) IV     PRN Meds: sodium chloride, acetaminophen, diphenhydrAMINE, famotidine, midazolam, nitroGLYCERIN, ondansetron (ZOFRAN) IV, sodium chloride flush   Vital Signs    Vitals:   08/20/19 1956 08/21/19 0530 08/21/19 0753 08/21/19 1135  BP: 129/69 (!) 143/76 140/68 (!) 141/71  Pulse: 73 75 75 87  Resp: 20 20 18 18   Temp: 98.2 F (36.8 C) (!) 97.5 F (36.4 C) 97.9 F (36.6 C) 98 F (36.7 C)  TempSrc: Oral Oral Oral Oral  SpO2: 96% 94% 96% 94%  Weight:  71.5 kg    Height:        Intake/Output Summary (Last 24 hours) at 08/21/2019 1148 Last data filed at 08/21/2019 0902 Gross per 24 hour  Intake 0 ml  Output 900 ml  Net -900 ml   Last 3 Weights 08/21/2019 08/20/2019 08/19/2019  Weight (lbs) 157 lb 10.1 oz 152 lb 1.9 oz 149 lb  14.6 oz  Weight (kg) 71.5 kg 69 kg 68 kg      Telemetry    Normal sinus rhythm- Personally Reviewed  ECG    No new ECG obtained- Personally Reviewed  Physical Exam   GEN: No acute distress.   Neck: No JVD Cardiac: RRR, no murmurs, rubs, or gallops.  Respiratory: Clear to auscultation bilaterally. GI: Soft, nontender, non-distended  MS: No edema; right arm access site bleed seem to improve today. Neuro:  Nonfocal  Psych: Normal affect   Labs    High Sensitivity Troponin:   Recent Labs  Lab 08/15/19 1024 08/15/19 1241 08/15/19 1656 08/15/19 2014  TROPONINIHS 171* 206* 341* 232*      Chemistry Recent Labs  Lab 08/19/19 0621 08/20/19 0504 08/21/19 0549  NA 138 138 138  K 3.6 3.4* 3.7  CL 106 107 107  CO2 23 21* 21*  GLUCOSE 108* 102* 99  BUN 9 9 7*  CREATININE 0.76 0.69 0.71  CALCIUM 8.9 8.9 9.0  GFRNONAA >60 >60 >60  GFRAA >60 >60 >60  ANIONGAP 9 10 10      Hematology Recent Labs  Lab 08/19/19 1607 08/20/19 0504 08/21/19 0549  WBC 16.0* 14.0* 13.3*  RBC 4.13 4.01 3.94  HGB 13.6 13.1 12.8  HCT 39.4 39.2 38.4  MCV 95.4 97.8 97.5  MCH 32.9 32.7 32.5  MCHC 34.5 33.4 33.3  RDW 13.1 13.0 13.0  PLT 283 288 298    BNPNo results for input(s): BNP, PROBNP in the last 168 hours.   DDimer No results for input(s): DDIMER in the last 168 hours.   Radiology    No results found.  Cardiac Studies   2D echo 08/15/2019: 1. Left ventricular ejection fraction, by estimation, is 55 to 60%. The  left ventricle has normal function. The left ventricle has no regional  wall motion abnormalities. There is mild left ventricular hypertrophy.  Left ventricular diastolic parameters  were normal.  2. Right ventricular systolic function is normal. The right ventricular  size is normal. There is normal pulmonary artery systolic pressure.  3. The mitral valve is normal in structure. Mild mitral valve  regurgitation. No evidence of mitral stenosis.  4. The aortic  valve is abnormal. Aortic valve regurgitation is not  visualized. Mild to moderate aortic valve sclerosis/calcification is  present, without any evidence of aortic stenosis.    LHC 08/16/2019: A drug-eluting stent was successfully placed using a STENT SYNERGY DES 2.25X20.  A stent was successfully placed.  Patient Profile     75 y.o. female with history of CLL, arthritis, CAD/PCI to RCA previously being seen for NSTEMI.  Left heart cath showed significant distal left circumflex stenosis, patient underwent drug-eluting stent to the left circumflex procedure complicated by right forearm hematoma being managed conservatively.  Assessment & Plan    1.  NSTEMI -No symptoms of chest pain -Status post DES to left circumflex.  Prior stent to RCA patent. -Continue aspirin Brilinta, Lipitor -Echo showed preserved ejection fraction  2.  Right forearm hematoma post procedure -Appreciate input from vascular surgery -Continue management and recommendations for vascular surgical team  -Dispo pending management/resolution of right forearm hematoma.  Total encounter time 35 minutes  Greater than 50% was spent in counseling and coordination of care with the patient       Signed, Kate Sable, MD  08/21/2019, 11:48 AM

## 2019-08-21 NOTE — Care Management Important Message (Signed)
Important Message  Patient Details  Name: Cheryl Briggs MRN: [id number] Date of Birth: 03/09/1945   Medicare Important Message Given:  Yes     Loann Quill 08/21/2019, 11:17 AM

## 2019-08-21 NOTE — Telephone Encounter (Signed)
TOC- still admitted call 08/22/19

## 2019-08-21 NOTE — Op Note (Signed)
OPERATIVE REPORT   PREOPERATIVE DIAGNOSIS: 1.  Right arm hematoma after radial access heart catheterization  POSTOPERATIVE DIAGNOSIS: Same as above  PROCEDURE PERFORMED: 1. Ultrasound guidance vascular access to right femoral artery. 2. Catheter placement to right radial artery from right femoral approach. 3. Thoracic aortogram and selective right upper extremity angiogram  including selective images of the radial artery. 4. StarClose closure device right femoral artery.  SURGEON: Algernon Huxley, MD  ANESTHESIA: Local with moderate conscious sedation for 20 minutes using 2 mg of Versed and 50 mcg of Fentanyl  BLOOD LOSS: Minimal.  FLUOROSCOPY TIME: 4.1 minutes  INDICATION FOR PROCEDURE: This is a 74 y.o.female who developed a significant right arm hematoma after a right radial artery catheterization for a cardiac catheterization.  The bruising and swelling are quite significant, and right upper extremity angiogram was performed to evaluate if there is ongoing bleeding potentially provide treatment.  Risks and benefits are discussed. Informed consent was obtained.  DESCRIPTION OF PROCEDURE: The patient was brought to the vascular suite. Moderate conscious sedation was administered during a face to face encounter with the patient throughout the procedure with my supervision of the RN administering medicines and monitoring the patient's vital signs, pulse oximetry, telemetry and mental status throughout from the start of the procedure until the patient was taken to the recovery room.  Groins were shaved and prepped and sterile surgical field was created. The right femoral head was localized with fluoroscopy and the right femoral artery was then visualized with ultrasound and found to be widely patent. It was then accessed under direct ultrasound guidance without difficulty with a Seldinger needle and a permanent image was recorded. A J-wire and 5-French sheath were  then placed. Pigtail catheter was placed into the ascending aorta and a thoracic aortogram was then performed in the LAO projection. This demonstrated normal origins to the great vessels without significant proximal stenoses and a normal configuration of the great vessels. The patient was given 4000 units of intravenous heparin and a half prior catheter was used to selectively cannulate the innominate artery and then advanced into the right subclavian artery without difficulty. This was then sequentially advanced to the brachial artery and to the brachial bifurcation.  Tortuous vessels without significant stenosis were seen throughout the right subclavian artery, axillary artery, and brachial artery.  There was a typical brachial bifurcation to the radial and ulnar arteries with the interosseous artery coming off the ulnar artery.  I then advanced the 125 cm KMP catheter into the proximal portion of the right radial artery where selective imaging was performed.  This did show a stenosis in the proximal segment of the artery in the 70 to 80% range but there was no active bleeding.  This may have been the difficult area for the sheath across where some bleeding occurred but this has now stopped and the artery has healed.  Her hand was nonischemic and she had good ulnar flow into her hand as well as radial artery flow into the hand so I elected not to treat this area today for fear of making the bleeding worse. The diagnostic catheter was removed. Oblique arteriogram was performed of the right femoral artery and StarClose closure device deployed in the usual fashion with excellent hemostatic result. The patient tolerated the procedure well and was taken to the recovery room in stable condition.   Leotis Pain 08/21/2019 2:30 PM

## 2019-08-21 NOTE — Progress Notes (Signed)
PROGRESS NOTE  Cheryl Briggs OYD:[id number] DOB: August 18, 1945 DOA: 08/15/2019 PCP: Joyice Faster, FNP  Brief History   Cheryl Briggs is a 74 y.o. female with medical history significant for hyperlipidemia, CLL, who presented to the hospital with midsternal chest pain.  Her troponins were elevated and she was admitted to the hospital for acute NSTEMI.  She was seen in consultation by the cardiologist and she underwent left heart catheterization. "  Complex PCI to distal RCA, rPDA and rPLA with drug-eluting stent was performed.  During PCI, she developed transient bradycardia and hypotension and subsequently developed tachycardia and hypotension after receiving atropine and norepinephrine. She then developed atrial fibrillation with RVR requiring IV amiodarone infusion.  She underwent repeat left heartcath on 08/19/2019. Drug-eluting stent was successfully placed in the distal left circumflex.   Unfortunately, she developed right forearm hematoma secondary to PCI via right radial artery access.  Today Dr. Lucky Cowboy took the patient to the OR for artiogram of the right upper extremity as it continued to appear very swollen with areas of induration. There was a stenotic segment in the proximal segment of the artery in the 70-80% range, but no active bleeding was seen. It appeared that the artery had healed. The patient tolerated the procedure well.  Consultants  . Vascular surgery . Cardiology  Procedures  . Left heart catheterization with placement of a DES to the left circumflex s/p prior recent stent to the RCA found to be patent. . Arteriogram of the right upper extremity due to development of hematoma.  Antibiotics   Anti-infectives (From admission, onward)   Start     Dose/Rate Route Frequency Ordered Stop   08/22/19 0600  ceFAZolin (ANCEF) IVPB 2g/100 mL premix  Status:  Discontinued       Note to Pharmacy: To be given in specials   2 g 200 mL/hr over 30 Minutes Intravenous   Once 08/21/19 1141 08/21/19 1230   08/21/19 1245  ceFAZolin (ANCEF) IVPB 2g/100 mL premix       Note to Pharmacy: To be given in specials   2 g 200 mL/hr over 30 Minutes Intravenous  Once 08/21/19 1230 08/21/19 1548   08/15/19 1600  cefTRIAXone (ROCEPHIN) 2 g in sodium chloride 0.9 % 100 mL IVPB  Status:  Discontinued        2 g 200 mL/hr over 30 Minutes Intravenous Every 24 hours 08/15/19 1557 08/15/19 1558    .   Subjective  The patient is seen prior to her procedure. She states that she believes that the right forearm looks better.  Objective   Vitals:  Vitals:   08/21/19 1505 08/21/19 1524  BP: 132/85 131/62  Pulse: 80 86  Resp: 18 18  Temp:  98.1 F (36.7 C)  SpO2: 96% 95%   Exam:  Constitutional:  . The patient is awake, alert, and oriented x 3. No acute distress. Respiratory:  . No increased work of breathing. . No wheezes, rales, or rhonchi . No tactile fremitus Cardiovascular:  . Regular rate and rhythm . No murmurs, ectopy, or gallups. . No lateral PMI. No thrills. Abdomen:  . Abdomen is soft, non-tender, non-distended . No hernias, masses, or organomegaly . Normoactive bowel sounds.  Musculoskeletal:  . No cyanosis, clubbing, or edema . Right forearm is swollen, discolored, and indurated near the antecubital fossa. The patient states that it is improved. Skin:  . No rashes, lesions, ulcers . palpation of skin: no induration or nodules . Discoloration of skin to right forearm.  Neurologic:  . CN 2-12 intact . Sensation all 4 extremities intact Psychiatric:  . Mental status o Mood, affect appropriate o Orientation to person, place, time  . judgment and insight appear intact  I have personally reviewed the following:   Today's Data  . Vitals, CBC, BMP  Micro Data  . MRSA by PCR negative.  Scheduled Meds: . aspirin EC  81 mg Oral Daily  . atorvastatin  80 mg Oral Daily  . calcium-vitamin D  1 tablet Oral BID WC  . Chlorhexidine Gluconate  Cloth  6 each Topical Daily  . cholecalciferol  3,000 Units Oral Daily  . fentaNYL      . heparin sodium (porcine)      . metoCLOPramide (REGLAN) injection  5 mg Intravenous Q8H  . metoprolol succinate  100 mg Oral Daily  . midazolam      . multivitamin with minerals  1 tablet Oral Daily  . mupirocin ointment  1 application Nasal BID  . pantoprazole  40 mg Oral Daily  . sodium chloride flush  3 mL Intravenous Once  . sodium chloride flush  3 mL Intravenous Q12H  . sodium chloride flush  3 mL Intravenous Q12H  . ticagrelor  90 mg Oral BID   Continuous Infusions: . sodium chloride    . sodium chloride 75 mL/hr at 08/21/19 0231    Principal Problem:   NSTEMI (non-ST elevated myocardial infarction) ([place]) Active Problems:   CLL (chronic lymphocytic leukemia) (HCC)   Atrial fibrillation with RVR (HCC)   Hematoma   Essential hypertension   LOS: 5 days   A & P  Acute NSTEMI s/p PCI with placement of drug-eluting stent to distal RCA and distal left circumflex: Continue aspirin, Brilinta, Lipitor and metoprolol.  Right forearm hematoma: Complication from left heart catheterization via right radial artery access.  Hematoma recurred after compression dressing was removed.  Continue compression dressing.  Patient was evaluated by vascular surgeon today.  Continue to monitor.  Plan for possible angiogram and stent placement to the injured area if there is no improvement. Discontinue prophylactic heparin. Today Dr. Lucky Cowboy took the patient to the OR for arteriogram of the right upper extremity as it continued to appear very swollen with areas of induration. There was a stenotic segment in the proximal segment of the artery in the 70-80% range, but no active bleeding was seen. It appeared that the artery had healed. The patient tolerated the procedure well.  Atrial fibrillation with RVR: Converted to normal sinus rhythm.  Amiodarone has been discontinued by cardiologist.    Acute diastolic heart  failure: Resolved.  She was given IV Lasix 40 mg x 1 dose during PCI.  Per cardiologist, LVEDP  was moderately elevated at the time of catheterization.  2D echo showed EF estimated at 55 to 60%, mild LVH.  Hyperlipidemia: Continue Lipitor  Hypotension and bradycardia during PCI : Resolved.  She required IV atropine and norepinephrine.    CLL/leukocytosis: Outpatient follow-up with oncologist.  I have seen and examined this patient myself. I have spent 38 minutes in her evaluation and care.  DVT Prophylaxis: SCD's CODE STATUS: Full Code Family Communication: Family at bedside during visit. Disposition:   Status is: Inpatient  Remains inpatient appropriate because:Inpatient level of care appropriate due to severity of illness   Dispo: The patient is from: Home              Anticipated d/c is to: Home  Anticipated d/c date is: 1 day              Patient currently is not medically stable to d/c.  Manolo Bosket, DO [facility] Direct contact: see [email or web address]  7PM-7AM contact night coverage as above 08/21/2019, 6:16 PM  LOS: 5 days

## 2019-08-22 ENCOUNTER — Encounter: Payer: Self-pay | Admitting: Vascular Surgery

## 2019-08-22 LAB — CBC
HCT: 37.1 % (ref 36.0–46.0)
Hemoglobin: 12.4 g/dL (ref 12.0–15.0)
MCH: 32.7 pg (ref 26.0–34.0)
MCHC: 33.4 g/dL (ref 30.0–36.0)
MCV: 97.9 fL (ref 80.0–100.0)
Platelets: 294 10*3/uL (ref 150–400)
RBC: 3.79 MIL/uL — ABNORMAL LOW (ref 3.87–5.11)
RDW: 13.2 % (ref 11.5–15.5)
WBC: 12.8 10*3/uL — ABNORMAL HIGH (ref 4.0–10.5)
nRBC: 0 % (ref 0.0–0.2)

## 2019-08-22 LAB — BASIC METABOLIC PANEL
Anion gap: 9 (ref 5–15)
BUN: 9 mg/dL (ref 8–23)
CO2: 24 mmol/L (ref 22–32)
Calcium: 9.2 mg/dL (ref 8.9–10.3)
Chloride: 103 mmol/L (ref 98–111)
Creatinine, Ser: 0.79 mg/dL (ref 0.44–1.00)
GFR calc Af Amer: >60 mL/min (ref >60)
GFR calc non Af Amer: >60 mL/min (ref >60)
Glucose, Bld: 102 mg/dL — ABNORMAL HIGH (ref 70–99)
Potassium: 4 mmol/L (ref 3.5–5.1)
Sodium: 136 mmol/L (ref 135–145)

## 2019-08-22 NOTE — Discharge Summary (Signed)
Physician Discharge Summary  Cheryl Briggs PRF:[id number] DOB: 1945/08/03 DOA: 08/15/2019  PCP: Joyice Faster, FNP  Admit date: 08/15/2019 Discharge date: 08/22/2019  Recommendations for Outpatient Follow-up:  Follow up with PCP in 7-10 days.  CBC and BMP to be drawn at that visit. Follow up with cardiology as directed.   Follow-up Information    Wellington Hampshire, MD On 09/02/2019.   Specialty: Cardiology Why: Appointment at 2:30pm Contact information: [place] [address] [phone]                Discharge Diagnoses: Principal diagnosis is #1 1. Right forearm hematoma 2. NSTEMI s/p PCI  3. Atrial Fibrillation with RVR 4. CLL  Discharge Condition: Fair  Disposition: Home  Diet recommendation: Heart healthy  Filed Weights   08/21/19 0530 08/21/19 1230 08/22/19 0406  Weight: 71.5 kg 69.2 kg 67.9 kg    History of present illness:  Cheryl Briggs is a 74 y.o. female with medical history significant for CLL who presents to the emergency room for evaluation of chest pain which she described as midsternal chest pressure, feeling like something was sitting on her chest.  She rated her pain an 8 x 10 intensity at its worst.  There was no radiation of the pain but was associated with diaphoresis, nausea, vomiting and shortness of breath.   She denies having any fever, chills, abdominal pain, no changes in her bowel habits, no urinary symptoms. Labs reveal a white count of 18.4, hemoglobin of 15.3 hematocrit of 45.9 and troponin of 171. Chest x-ray reviewed by me shows no acute findings. Twelve-lead ECG reviewed by me shows normal sinus rhythm with low voltage QRS.  ED Course: Patient is a 74 year old female who presents to the ER for evaluation of chest pressure mostly midsternal nonradiating associated with shortness of breath and diaphoresis. Twelve-lead EKG does not show acute ST-T wave changes. Patient has an elevated troponin  level. She will be referred to observation status.  Hospital Course: Cheryl Langfordis a 74 y.o.femalewith medical history significant for hyperlipidemia, CLL, who presented to the hospital with midsternal chest pain. Her troponins were elevated and she was admitted to the hospital for acute NSTEMI. She was seen in consultation by the cardiologist and she underwent left heart catheterization. " Complex PCI to distal RCA, rPDA and rPLA with drug-eluting stent was performed.  During PCI, she developed transient bradycardia and hypotension and subsequently developed tachycardia and hypotension after receiving atropine and norepinephrine. She then developed atrial fibrillation with RVR requiring IV amiodarone infusion.  She underwent repeat left heartcath on 08/19/2019. Drug-eluting stent was successfully placed in the distal left circumflex.Unfortunately, she developed right forearm hematoma secondary to PCI via right radial artery access.  Today Dr. Lucky Cowboy took the patient to the OR for artiogram of the right upper extremity as it continued to appear very swollen with areas of induration. There was a stenotic segment in the proximal segment of the artery in the 70-80% range, but no active bleeding was seen. It appeared that the artery had healed. The patient tolerated the procedure well.  She was feeling well today and arm was improved. She was cleared for discharge by cardiology,  Today's assessment: S: The patient is resting comfortably. No new complaints. O: Vitals:  Vitals:   08/22/19 0406 08/22/19 0732  BP: 128/67 128/61  Pulse: 66 68  Resp: 20 19  Temp: 97.7 F (36.5 C) 97.8 F (36.6 C)  SpO2: 96% 96%   Constitutional:  The patient is awake, alert, and oriented x 3. No acute distress. Respiratory:   No increased work of breathing.  No wheezes, rales, or rhonchi  No tactile fremitus Cardiovascular:   Regular rate and rhythm  No murmurs, ectopy, or  gallups.  No lateral PMI. No thrills. Abdomen:   Abdomen is soft, non-tender, non-distended  No hernias, masses, or organomegaly  Normoactive bowel sounds.  Musculoskeletal:   No cyanosis, clubbing, or edema Right forearm is less more swollen, discolored, and less indurated near the antecubital fossa.  Skin:   No rashes, lesions, ulcers  palpation of skin: no induration or nodules  Discoloration of skin to right forearm. Neurologic:   CN 2-12 intact  Sensation all 4 extremities intact Psychiatric:   Mental status ? Mood, affect appropriate ? Orientation to person, place, time   judgment and insight appear intact  Discharge Instructions  Discharge Instructions    AMB Referral to Cardiac Rehabilitation - Phase II   Complete by: As directed    Diagnosis:  Coronary Stents NSTEMI     After initial evaluation and assessments completed: Virtual Based Care may be provided alone or in conjunction with Phase 2 Cardiac Rehab based on patient barriers.: Yes   AMB Referral to Cardiac Rehabilitation - Phase II   Complete by: As directed    Diagnosis:  NSTEMI Coronary Stents     After initial evaluation and assessments completed: Virtual Based Care may be provided alone or in conjunction with Phase 2 Cardiac Rehab based on patient barriers.: Yes   Activity as tolerated - No restrictions   Complete by: As directed    Call MD for:  difficulty breathing, headache or visual disturbances   Complete by: As directed    Call MD for:  redness, tenderness, or signs of infection (pain, swelling, redness, odor or green/yellow discharge around incision site)   Complete by: As directed    Call MD for:  severe uncontrolled pain   Complete by: As directed    Diet - low sodium heart healthy   Complete by: As directed    Discharge instructions   Complete by: As directed    Follow-up with cardiologist as scheduled.   Discharge instructions   Complete by: As directed    Follow up with  PCP in 7-10 days.  CBC and BMP to be drawn at that visit. Follow up with cardiology as directed.   Increase activity slowly   Complete by: As directed    Increase activity slowly   Complete by: As directed      Allergies as of 08/22/2019      Reactions   Corticosteroids    Other reaction(s): Headache   Other Other (See Comments)   Sulfa Antibiotics Anaphylaxis   Clarithromycin    Other reaction(s): Other (See Comments) Rectal bleeding   Cymbalta [duloxetine Hcl] Diarrhea   Prednisone Other (See Comments)   Color changes in face, headache and difficulty walking      Medication List    STOP taking these medications   diclofenac 75 MG EC tablet Commonly known as: VOLTAREN   diclofenac sodium 1 % Gel Commonly known as: VOLTAREN   simvastatin 40 MG tablet Commonly known as: ZOCOR     TAKE these medications   aspirin 81 MG EC tablet Take 1 tablet (81 mg total) by mouth daily. Swallow whole.   atorvastatin 80 MG tablet Commonly known as: LIPITOR Take 1 tablet (80 mg total) by mouth daily.   calcium citrate-vitamin D  315-200 MG-UNIT tablet Commonly known as: CITRACAL+D Take 1 tablet by mouth 2 (two) times daily.   CHLOR-TABLETS PO Take 1 tablet by mouth as needed.   desonide 0.05 % ointment Commonly known as: DESOWEN desonide 0.05 % topical ointment   ESTER C PO Take 1,000 mg by mouth daily.   metoprolol succinate 50 MG 24 hr tablet Commonly known as: TOPROL-XL Take 1.5 tablets (75 mg total) by mouth daily. What changed: how much to take   multivitamin with minerals tablet Take 1 tablet by mouth daily.   PRILOSEC PO Take 1 tablet by mouth daily.   ticagrelor 90 MG Tabs tablet Commonly known as: BRILINTA Take 1 tablet (90 mg total) by mouth 2 (two) times daily.   Vitamin D-1000 Max St 25 MCG (1000 UT) tablet Generic drug: Cholecalciferol Take 3,000 Units by mouth daily.      Allergies  Allergen Reactions  . Corticosteroids     Other reaction(s):  Headache  . Other Other (See Comments)  . Sulfa Antibiotics Anaphylaxis  . Clarithromycin     Other reaction(s): Other (See Comments) Rectal bleeding   . Cymbalta [Duloxetine Hcl] Diarrhea  . Prednisone Other (See Comments)    Color changes in face, headache and difficulty walking    The results of significant diagnostics from this hospitalization (including imaging, microbiology, ancillary and laboratory) are listed below for reference.    Significant Diagnostic Studies: DG Chest 2 View  Result Date: 08/15/2019 CLINICAL DATA:  Chest pain EXAM: CHEST - 2 VIEW COMPARISON:  10/06/2017 FINDINGS: Heart is normal size. Linear scarring at the left base. Right lung clear. No effusions or acute bony abnormality. Degenerative changes in the shoulders and thoracic spine. Leftward scoliosis in the thoracolumbar spine. IMPRESSION: Chronic changes.  No active disease. Electronically Signed   By: Rolm Baptise M.D.   On: 08/15/2019 11:12   CARDIAC CATHETERIZATION  Result Date: 08/19/2019  2nd Diag lesion is 80% stenosed.  1st Mrg lesion is 100% stenosed.  Prox Cx to Mid Cx lesion is 60% stenosed.  Dist Cx lesion is 80% stenosed.  Mid RCA lesion is 55% stenosed.  RPAV lesion is 10% stenosed.  Balloon angioplasty was performed.  Previously placed Dist RCA drug-eluting stents are widely patent with 0% stenosed side branch in RPDA.  Post intervention, there is a 0% residual stenosis.  A drug-eluting stent was successfully placed using a STENT RESOLUTE ONYX 2.75X18.  1.  Widely patent RCA stent with normal flow distally.  Unchanged left coronary artery disease. 2.  Mildly elevated left ventricular end-diastolic pressure at 14 mmHg. 3.  Successful direct stenting of the distal left circumflex. Recommendations: Continue dual antiplatelet therapy for 1 year. Aggressive treatment of risk factors. If the patient remains stable, she can potentially be discharged home in the afternoon.   CARDIAC  CATHETERIZATION  Result Date: 08/16/2019  A drug-eluting stent was successfully placed using a STENT SYNERGY DES 2.25X20.  A stent was successfully placed.  Conclusions: 1. 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 2. Moderately elevated left ventricular filling pressure. 3. Complex PCI to distal RCA, rPDA, and rPLA using Synergy 2.25 x 20 mm drug-eluting stent extending from the distal RCA into the rPDA, as well as angioplasty of the ostial rPL branch.  Intervention was complicated by no-reflow with transient hypotension/bradycardia and subsequent development  of atrial fibrillation with rapid ventricular response.  Final angiogram shows 0% residual stenosis in the distal RCA/rPDA and 10% residual stenosis at ostial rPL with TIMI-2 flow. Recommendations: 1. Transfer to ICU for aggressive medical therapy and post-PCI monitoring. 2. Continue tirofiban infusion for 18 hours. 3. Dual antiplatelet therapy with aspirin and ticagrelor for at least 12 months. 4. Titrate nitroglycerine infusion for relief of chest pain. 5. Continue amiodarone infusion for rate control and hopefully conversion to sinus rhythm.  If patient remains in atrial fibrillation tomorrow, IV heparin should be started (will defer heparin at this time given tirofiban infusion). 6. Aggressive secondary prevention. 7. Anticipate relook catheterization on Monday with PCI to the LCx.  I favor medical therapy for D2, given relatively small vessel size and need to stent back to the LAD, were intervention undertaken. Nelva Bush, MD [facility]  PERIPHERAL VASCULAR CATHETERIZATION  Result Date: 08/22/2019 See op note  ECHOCARDIOGRAM COMPLETE  Result Date: 08/15/2019    ECHOCARDIOGRAM REPORT   Patient Name:   Cheryl Briggs Date of Exam: 08/15/2019  Medical Rec #:  [id number]         Height:       62.0 in Accession #:    [id number]        Weight:       158.1 lb Date of Birth:  Jun 09, 1945         BSA:          1.730 m Patient Age:    51 years          BP:           137/60 mmHg Patient Gender: F                 HR:           75 bpm. Exam Location:  [facility] Procedure: 2D Echo, Cardiac Doppler and Color Doppler Indications:     NSTEMI 121.4  History:         Patient has no prior history of Echocardiogram examinations. No                  cardiac history listed in chart.  Sonographer:     Sherrie Sport RDCS (AE) Referring Phys:  [place] Diagnosing Phys: Kathlyn Sacramento MD IMPRESSIONS  1. Left ventricular ejection fraction, by estimation, is 55 to 60%. The left ventricle has normal function. The left ventricle has no regional wall motion abnormalities. There is mild left ventricular hypertrophy. Left ventricular diastolic parameters were normal.  2. Right ventricular systolic function is normal. The right ventricular size is normal. There is normal pulmonary artery systolic pressure.  3. The mitral valve is normal in structure. Mild mitral valve regurgitation. No evidence of mitral stenosis.  4. The aortic valve is abnormal. Aortic valve regurgitation is not visualized. Mild to moderate aortic valve sclerosis/calcification is present, without any evidence of aortic stenosis. FINDINGS  Left Ventricle: Left ventricular ejection fraction, by estimation, is 55 to 60%. The left ventricle has normal function. The left ventricle has no regional wall motion abnormalities. The left ventricular internal cavity size was normal in size. There is  mild left ventricular hypertrophy. Left ventricular diastolic parameters were normal. Right Ventricle: The right ventricular size is normal. No increase in right ventricular wall thickness. Right ventricular systolic function is normal. There is normal pulmonary artery systolic pressure. The tricuspid regurgitant velocity is 2.17  m/s, and  with an assumed right atrial pressure of 10 mmHg, the  estimated right ventricular systolic pressure is 17.5 mmHg. Left Atrium: Left atrial size was normal in size. Right Atrium: Right atrial size was normal in size. Pericardium: There is no evidence of pericardial effusion. Mitral Valve: The mitral valve is normal in structure. Normal mobility of the mitral valve leaflets. Mild mitral valve regurgitation. No evidence of mitral valve stenosis. Tricuspid Valve: The tricuspid valve is normal in structure. Tricuspid valve regurgitation is trivial. No evidence of tricuspid stenosis. Aortic Valve: The aortic valve is abnormal. Aortic valve regurgitation is not visualized. Mild to moderate aortic valve sclerosis/calcification is present, without any evidence of aortic stenosis. Aortic valve mean gradient measures 4.0 mmHg. Aortic valve peak gradient measures 7.4 mmHg. Aortic valve area, by VTI measures 2.29 cm. Pulmonic Valve: The pulmonic valve was normal in structure. Pulmonic valve regurgitation is not visualized. No evidence of pulmonic stenosis. Aorta: The aortic root is normal in size and structure. Venous: The inferior vena cava was not well visualized. IAS/Shunts: No atrial level shunt detected by color flow Doppler.  LEFT VENTRICLE PLAX 2D LVIDd:         4.34 cm  Diastology LVIDs:         2.87 cm  LV e' lateral:   7.29 cm/s LV PW:         1.28 cm  LV E/e' lateral: 12.7 LV IVS:        0.91 cm  LV e' medial:    6.42 cm/s LVOT diam:     2.00 cm  LV E/e' medial:  14.4 LV SV:         64 LV SV Index:   37 LVOT Area:     3.14 cm  RIGHT VENTRICLE RV Basal diam:  2.02 cm RV S prime:     12.20 cm/s TAPSE (M-mode): 3.5 cm LEFT ATRIUM             Index       RIGHT ATRIUM           Index LA diam:        3.30 cm 1.91 cm/m  RA Area:     11.00 cm LA Vol (A2C):   21.2 ml 12.26 ml/m RA Volume:   24.30 ml  14.05 ml/m LA Vol (A4C):   32.3 ml 18.67 ml/m LA Biplane Vol: 26.7 ml 15.44 ml/m  AORTIC VALVE                    PULMONIC VALVE AV Area (Vmax):    1.72 cm    PV Vmax:        0.61 m/s AV Area (Vmean):   1.56 cm    PV Peak grad:   1.5 mmHg AV Area (VTI):     2.29 cm    RVOT Peak grad: 2 mmHg AV Vmax:           136.33 cm/s AV Vmean:          92.233 cm/s AV VTI:            0.280 m AV Peak Grad:      7.4 mmHg AV Mean Grad:      4.0 mmHg LVOT Vmax:         74.60 cm/s LVOT Vmean:        45.900 cm/s LVOT VTI:          0.204 m LVOT/AV VTI ratio: 0.73  AORTA Ao Root diam: 2.70 cm MITRAL VALVE  TRICUSPID VALVE MV Area (PHT): 3.68 cm    TR Peak grad:   18.8 mmHg MV Decel Time: 206 msec    TR Vmax:        217.00 cm/s MV E velocity: 92.60 cm/s MV A velocity: 89.20 cm/s  SHUNTS MV E/A ratio:  1.04        Systemic VTI:  0.20 m                            Systemic Diam: 2.00 cm Kathlyn Sacramento MD Electronically signed by Kathlyn Sacramento MD Signature Date/Time: 08/15/2019/4:07:35 PM    Final    ECHOCARDIOGRAM LIMITED  Result Date: 08/17/2019    ECHOCARDIOGRAM LIMITED REPORT   Patient Name:   Cheryl Briggs Date of Exam: 08/17/2019 Medical Rec #:  [id number]         Height:       62.0 in Accession #:    [id number]        Weight:       153.0 lb Date of Birth:  02-Jul-1945         BSA:          1.706 m Patient Age:    51 years          BP:           140/58 mmHg Patient Gender: F                 HR:           95 bpm. Exam Location:  [facility] Procedure: 2D Echo, Limited Echo, Cardiac Doppler and Color Doppler Indications:     Acute myocardial infarction 410  History:         Patient has prior history of Echocardiogram examinations.  Sonographer:     Alyse Low Roar Referring Phys:  [place] Diagnosing Phys: Skeet Latch MD IMPRESSIONS  1. Left ventricular ejection fraction, by estimation, is 60 to 65%. The left ventricle has normal function. The left ventricle has no regional wall motion abnormalities. There is mild concentric left ventricular hypertrophy. Left ventricular diastolic parameters are consistent with Grade I  diastolic dysfunction (impaired relaxation). Elevated left ventricular end-diastolic pressure.  2. Right ventricular systolic function is normal. The right ventricular size is normal.  3. The mitral valve is normal in structure. Trivial mitral valve regurgitation. No evidence of mitral stenosis.  4. The aortic valve is tricuspid. Aortic valve regurgitation is not visualized. No aortic stenosis is present. FINDINGS  Left Ventricle: Left ventricular ejection fraction, by estimation, is 60 to 65%. The left ventricle has normal function. The left ventricle has no regional wall motion abnormalities. The left ventricular internal cavity size was normal in size. There is  mild concentric left ventricular hypertrophy. Elevated left ventricular end-diastolic pressure. Right Ventricle: The right ventricular size is normal. No increase in right ventricular wall thickness. Right ventricular systolic function is normal. Left Atrium: Left atrial size was normal in size. Right Atrium: Right atrial size was normal in size. Pericardium: Trivial pericardial effusion is present. Mitral Valve: The mitral valve is normal in structure. Normal mobility of the mitral valve leaflets. Trivial mitral valve regurgitation. No evidence of mitral valve stenosis. Tricuspid Valve: The tricuspid valve is normal in structure. Tricuspid valve regurgitation is trivial. No evidence of tricuspid stenosis. Aortic Valve: The aortic valve is tricuspid. Aortic valve regurgitation is not visualized. No aortic stenosis is present. Aortic valve peak gradient measures 13.1 mmHg.  Pulmonic Valve: The pulmonic valve was normal in structure. Pulmonic valve regurgitation is not visualized. No evidence of pulmonic stenosis. Aorta: The aortic root is normal in size and structure. Venous: The inferior vena cava was not well visualized. IAS/Shunts: No atrial level shunt detected by color flow Doppler. LEFT VENTRICLE PLAX 2D LVIDd:         3.64 cm  Diastology LVIDs:          2.46 cm  LV e' lateral:   7.51 cm/s LV PW:         1.04 cm  LV E/e' lateral: 11.8 LV IVS:        1.21 cm  LV e' medial:    4.35 cm/s LVOT diam:     1.70 cm  LV E/e' medial:  20.4 LVOT Area:     2.27 cm  RIGHT VENTRICLE RV Mid diam:    2.95 cm RV S prime:     14.90 cm/s TAPSE (M-mode): 2.0 cm LEFT ATRIUM             Index       RIGHT ATRIUM          Index LA diam:        3.50 cm 2.05 cm/m  RA Area:     9.52 cm LA Vol (A2C):   32.5 ml 19.05 ml/m RA Volume:   19.70 ml 11.55 ml/m LA Vol (A4C):   26.2 ml 15.36 ml/m LA Biplane Vol: 29.1 ml 17.06 ml/m  AORTIC VALVE                PULMONIC VALVE AV Area (Vmax): 1.30 cm    PV Vmax:        1.07 m/s AV Vmax:        181.00 cm/s PV Peak grad:   4.6 mmHg AV Peak Grad:   13.1 mmHg   RVOT Peak grad: 2 mmHg LVOT Vmax:      104.00 cm/s  AORTA Ao Root diam: 2.50 cm MITRAL VALVE                TRICUSPID VALVE MV Area (PHT): 4.21 cm     TR Peak grad:   29.2 mmHg MV Decel Time: 180 msec     TR Vmax:        270.00 cm/s MV E velocity: 88.80 cm/s MV A velocity: 108.00 cm/s  SHUNTS MV E/A ratio:  0.82         Systemic Diam: 1.70 cm MV A Prime:    11.9 cm/s Skeet Latch MD Electronically signed by Skeet Latch MD Signature Date/Time: 08/17/2019/2:21:07 PM    Final     Microbiology: Recent Results (from the past 240 hour(s))  SARS Coronavirus 2 by RT PCR (hospital order, performed in [facility] hospital lab) Nasopharyngeal Nasopharyngeal Swab     Status: None   Collection Time: 08/15/19 12:41 PM   Specimen: Nasopharyngeal Swab  Result Value Ref Range Status   SARS Coronavirus 2 NEGATIVE NEGATIVE Final    Comment: (NOTE) SARS-CoV-2 target nucleic acids are NOT DETECTED.  The SARS-CoV-2 RNA is generally detectable in upper and lower respiratory specimens during the acute phase of infection. The lowest concentration of SARS-CoV-2 viral copies this assay can detect is 250 copies / mL. A negative result does not preclude SARS-CoV-2 infection and should not be used  as the sole basis for treatment or other patient management decisions.  A negative result may occur with improper specimen collection / handling, submission  of specimen other than nasopharyngeal swab, presence of viral mutation(s) within the areas targeted by this assay, and inadequate number of viral copies (<250 copies / mL). A negative result must be combined with clinical observations, patient history, and epidemiological information.  Fact Sheet for Patients:   StrictlyIdeas.no  Fact Sheet for Healthcare Providers: BankingDealers.co.za  This test is not yet approved or  cleared by the [place] FDA and has been authorized for detection and/or diagnosis of SARS-CoV-2 by FDA under an Emergency Use Authorization (EUA).  This EUA will remain in effect (meaning this test can be used) for the duration of the COVID-19 declaration under Section 564(b)(1) of the Act, 21 U.S.C. section 360bbb-3(b)(1), unless the authorization is terminated or revoked sooner.  Performed at [facility], [address], [place], [place] [address]   Surgical PCR screen     Status: None   Collection Time: 08/16/19 10:25 PM   Specimen: Nasal Mucosa; Nasal Swab  Result Value Ref Range Status   MRSA, PCR NEGATIVE NEGATIVE Final   Staphylococcus aureus NEGATIVE NEGATIVE Final    Comment: (NOTE) The Xpert SA Assay (FDA approved for NASAL specimens in patients 22 years of age and older), is one component of a comprehensive surveillance program. It is not intended to diagnose infection nor to guide or monitor treatment. Performed at [facility], [address] [place], [place] [address]      Labs: Basic Metabolic Panel: Recent Labs  Lab 08/17/19 0732 08/17/19 0741 08/19/19 9381 08/20/19 0504 08/21/19 0549 08/22/19 0639  NA 138  --  138 138 138 136  K 3.5  --  3.6 3.4* 3.7 4.0  CL 104  --  106 107 107 103  CO2 22  --  23 21*  21* 24  GLUCOSE 116*  --  108* 102* 99 102*  BUN 6*  --  9 9 7* 9  CREATININE 0.80  --  0.76 0.69 0.71 0.79  CALCIUM 8.8*  --  8.9 8.9 9.0 9.2  MG  --  2.1  --   --   --   --    Liver Function Tests: No results for input(s): AST, ALT, ALKPHOS, BILITOT, PROT, ALBUMIN in the last 168 hours. No results for input(s): LIPASE, AMYLASE in the last 168 hours. No results for input(s): AMMONIA in the last 168 hours. CBC: Recent Labs  Lab 08/17/19 0732 08/19/19 1607 08/20/19 0504 08/21/19 0549 08/22/19 0639  WBC 18.1* 16.0* 14.0* 13.3* 12.8*  HGB 13.9 13.6 13.1 12.8 12.4  HCT 42.3 39.4 39.2 38.4 37.1  MCV 98.6 95.4 97.8 97.5 97.9  PLT 298 283 288 298 294   Cardiac Enzymes: No results for input(s): CKTOTAL, CKMB, CKMBINDEX, TROPONINI in the last 168 hours. BNP: BNP (last 3 results) No results for input(s): BNP in the last 8760 hours.  ProBNP (last 3 results) No results for input(s): PROBNP in the last 8760 hours.  CBG: Recent Labs  Lab 08/16/19 1205  GLUCAP 151*    Principal Problem:   NSTEMI (non-ST elevated myocardial infarction) ([place]) Active Problems:   CLL (chronic lymphocytic leukemia) (HCC)   Atrial fibrillation with RVR (HCC)   Hematoma   Essential hypertension  Time coordinating discharge: 38 minutes  Signed:        Marney Treloar, DO [facility]  08/22/2019, 6:01 PM

## 2019-08-22 NOTE — Progress Notes (Signed)
Progress Note  Patient Name: Cheryl Briggs Date of Encounter: 08/22/2019  Primary Cardiologist: Fletcher Anon  Subjective   No chest pain, dyspnea, palpitations, dizziness, presyncope, syncope.  Right upper extremity ecchymosis is noted with much improved pain.  Upper extremity is able to be unwrapped without symptoms at this time.  She continues to elevate this.  Labs and vital signs stable.  Inpatient Medications    Scheduled Meds: . aspirin EC  81 mg Oral Daily  . atorvastatin  80 mg Oral Daily  . calcium-vitamin D  1 tablet Oral BID WC  . Chlorhexidine Gluconate Cloth  6 each Topical Daily  . cholecalciferol  3,000 Units Oral Daily  . metoCLOPramide (REGLAN) injection  5 mg Intravenous Q8H  . metoprolol succinate  100 mg Oral Daily  . multivitamin with minerals  1 tablet Oral Daily  . pantoprazole  40 mg Oral Daily  . sodium chloride flush  3 mL Intravenous Once  . sodium chloride flush  3 mL Intravenous Q12H  . sodium chloride flush  3 mL Intravenous Q12H  . ticagrelor  90 mg Oral BID   Continuous Infusions: . sodium chloride    . sodium chloride 75 mL/hr at 08/21/19 0231   PRN Meds: sodium chloride, acetaminophen, nitroGLYCERIN, ondansetron (ZOFRAN) IV, sodium chloride flush   Vital Signs    Vitals:   08/21/19 1524 08/21/19 2118 08/22/19 0406 08/22/19 0732  BP: 131/62 116/61 128/67 128/61  Pulse: 86 75 66 68  Resp: 18 20 20 19   Temp: 98.1 F (36.7 C) 98.2 F (36.8 C) 97.7 F (36.5 C) 97.8 F (36.6 C)  TempSrc: Oral Oral Oral Oral  SpO2: 95% 94% 96% 96%  Weight:   67.9 kg   Height:        Intake/Output Summary (Last 24 hours) at 08/22/2019 1134 Last data filed at 08/22/2019 0950 Gross per 24 hour  Intake 600 ml  Output 2125 ml  Net -1525 ml   Filed Weights   08/21/19 0530 08/21/19 1230 08/22/19 0406  Weight: 71.5 kg 69.2 kg 67.9 kg    Telemetry    SR - Personally Reviewed  ECG    No new tracings - Personally Reviewed  Physical Exam   GEN:  No acute distress.   Neck: No JVD. Cardiac: RRR, no murmurs, rubs, or gallops.  Respiratory: Clear to auscultation bilaterally.  GI: Soft, nontender, non-distended.   MS: No edema; right upper extremity forearm soft ecchymosis noted with radial pulse 2+. Neuro:  Alert and oriented x 3; Nonfocal.  Psych: Normal affect.  Labs    Chemistry Recent Labs  Lab 08/20/19 0504 08/21/19 0549 08/22/19 0639  NA 138 138 136  K 3.4* 3.7 4.0  CL 107 107 103  CO2 21* 21* 24  GLUCOSE 102* 99 102*  BUN 9 7* 9  CREATININE 0.69 0.71 0.79  CALCIUM 8.9 9.0 9.2  GFRNONAA >60 >60 >60  GFRAA >60 >60 >60  ANIONGAP 10 10 9      Hematology Recent Labs  Lab 08/20/19 0504 08/21/19 0549 08/22/19 0639  WBC 14.0* 13.3* 12.8*  RBC 4.01 3.94 3.79*  HGB 13.1 12.8 12.4  HCT 39.2 38.4 37.1  MCV 97.8 97.5 97.9  MCH 32.7 32.5 32.7  MCHC 33.4 33.3 33.4  RDW 13.0 13.0 13.2  PLT 288 298 294    Cardiac EnzymesNo results for input(s): TROPONINI in the last 168 hours. No results for input(s): TROPIPOC in the last 168 hours.   BNPNo results for input(s):  BNP, PROBNP in the last 168 hours.   DDimer No results for input(s): DDIMER in the last 168 hours.   Radiology    PERIPHERAL VASCULAR CATHETERIZATION  Result Date: 08/22/2019 See op note   Cardiac Studies   2D echo 08/15/2019: 1. Left ventricular ejection fraction, by estimation, is 55 to 60%. The  left ventricle has normal function. The left ventricle has no regional  wall motion abnormalities. There is mild left ventricular hypertrophy.  Left ventricular diastolic parameters  were normal.  2. Right ventricular systolic function is normal. The right ventricular  size is normal. There is normal pulmonary artery systolic pressure.  3. The mitral valve is normal in structure. Mild mitral valve  regurgitation. No evidence of mitral stenosis.  4. The aortic valve is abnormal. Aortic valve regurgitation is not  visualized. Mild to moderate aortic  valve sclerosis/calcification is  present, without any evidence of aortic stenosis.  __________   LHC 08/16/2019: A drug-eluting stent was successfully placed using a STENT SYNERGY DES 2.25X20.  A stent was successfully placed.  Conclusions: 1. 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 2. Moderately elevated left ventricular filling pressure. 3. Complex PCI to distal RCA, rPDA, and rPLA using Synergy 2.25 x 20 mm drug-eluting stent extending from the distal RCA into the rPDA, as well as angioplasty of the ostial rPL branch. Intervention was complicated by no-reflow with transient hypotension/bradycardia and subsequent development of atrial fibrillation with rapid ventricular response. Final angiogram shows 0% residual stenosis in the distal RCA/rPDA and 10% residual stenosis at ostial rPL with TIMI-2 flow.  Recommendations: 1. Transfer to ICU for aggressive medical therapy and post-PCI monitoring. 2. Continue tirofiban infusion for 18 hours. 3. Dual antiplatelet therapy with aspirin and ticagrelor for at least 12 months. 4. Titrate nitroglycerine infusion for relief of chest pain. 5. Continue amiodarone infusion for rate control and hopefully conversion to sinus rhythm. If patient remains in atrial fibrillation tomorrow, IV heparin should be started (will defer heparin at this time given tirofiban infusion). 6. Aggressive secondary prevention. 7. Anticipate relook catheterization on Monday with PCI to the LCx. I favor medical therapy for D2, given relatively small vessel size and need to stent back to the LAD, were intervention undertaken. __________  Limited 2D echo 08/17/2019: 1. Left ventricular ejection fraction, by estimation, is 60 to 65%. The  left ventricle has normal function. The  left ventricle has no regional  wall motion abnormalities. There is mild concentric left ventricular  hypertrophy. Left ventricular diastolic  parameters are consistent with Grade I diastolic dysfunction (impaired  relaxation). Elevated left ventricular end-diastolic pressure.  2. Right ventricular systolic function is normal. The right ventricular  size is normal.  3. The mitral valve is normal in structure. Trivial mitral valve  regurgitation. No evidence of mitral stenosis.  4. The aortic valve is tricuspid. Aortic valve regurgitation is not  visualized. No aortic stenosis is present. __________  LHC 08/19/2019:  2nd Diag lesion is 80% stenosed.  1st Mrg lesion is 100% stenosed.  Prox Cx to Mid Cx lesion is 60% stenosed.  Dist Cx lesion is 80% stenosed.  Mid RCA lesion is 55% stenosed.  RPAV lesion is 10% stenosed.  Balloon angioplasty was performed.  Previously placed Dist RCA drug-eluting stents are widely patent with 0% stenosed side branch in RPDA.  Post intervention, there is a 0% residual stenosis.  A drug-eluting stent was successfully placed using a STENT RESOLUTE ONYX 2.75X18.  1. Widely patent RCA stent with normal flow distally. Unchanged left coronary artery disease. 2. Mildly elevated left ventricular end-diastolic pressure at 14 mmHg. 3. Successful direct stenting of the distal left circumflex.  Recommendations: Continue dual antiplatelet therapy for 1 year. Aggressive treatment of risk factors.  Patient Profile     74 y.o. 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 fib with RVR which has subsequently resolved with relook angiography on 8/9 showing a patent RCA stent with successful PCI to  the distal LCx with post procedure complicated by right forearm hematoma.  Assessment & Plan    1. NSTEMI: -Chest pain-free -Status post initial cath on 8/6 with distal RCA bifurcation PCI/DES complicated by no reflow which was successfully treated with intracoronary adenosine followed by transient hypotension and A. fib with RVR which have resolved.  She underwent relook angiography on 8/9 which showed a patent RCA stent and normal flow distally with successful PCI/DES to the distal LCx.  Post procedure this case was complicated by right forearm hematoma and is now status post right ulnar artery angiogram without needed intervention and conservatively managed  -Continue DAPT with aspirin and ticagrelor without interruption for at least the next 12 months the importance of this therapy was discussed with the patient in detail -Post-cath instructions discussed in detail including expected ecchymosis of the right forearm -Continue to elevate right upper extremity -Toprol-XL  -Atorvastatin -Cardiac rehab  2.  Post PCI A. fib with RVR: -Transient episode felt to be likely secondary to ischemia with no recommendation for [place] at this time -Given no recurrence of atrial arrhythmia amiodarone will be discontinued -Toprol-XL to 100 mg daily -Should she have recurrence of atrial arrhythmia Moran will need to be reconsidered -TSH normal this admission  3.  HLD: -LDL 82 this admission on simvastatin PTA which has subsequently been transitioned to atorvastatin 80 mg daily -Follow-up fasting lipid panel and liver function in approximately 8 weeks -If LDL remains above goal of less than 70 at that time recommend addition of Zetia  4.  HTN: -Blood pressure is well controlled -Toprol-XL as outlined above  Dispo: -She may be discharged from the hospital today on current medical therapy -Please ensure she has a [employer] discount card prior to discharge -Follow-up in our office in 1 to 2 weeks  For  questions or updates, please contact [place] Please consult [email or web address] for contact info under Cardiology/STEMI.    Signed, Christell Faith, PA-C [place] Pager: [phone] 08/22/2019, 11:34 AM

## 2019-08-26 NOTE — Telephone Encounter (Signed)
Patient contacted regarding discharge from [facility] on 08/22/19.  Patient understands to follow up with provider Marrianne Mood, PA on 09/02/19 at 2:30pm at [facility] office. Patient understands discharge instructions? yew Patient understands medications and regiment? yes Patient understands to bring all medications to this visit? yes  Patient states that she has taken diclofenac orally and topically for years for her arthritic pain. The medications were removed form her medication list at her hospital discharge. She would like to know if she can resume them as needed for pain. See has d/c her other NSAIDs as instructed. Adv the patient that I will fwd the question to Dr. Fletcher Anon and call back with his response.

## 2019-08-26 NOTE — Telephone Encounter (Signed)
Given recent stent placement and being on dual antiplatelet therapy, diclofenac and all other NSAIDs are not recommended.  Topical diclofenac might be acceptable.  She should try to use oral Tylenol instead.  If that does not give her relief, she might need to discuss other options with her primary care physician.

## 2019-08-27 ENCOUNTER — Telehealth: Payer: Self-pay | Admitting: Cardiovascular Disease

## 2019-08-27 DIAGNOSIS — I10 Essential (primary) hypertension: Secondary | ICD-10-CM

## 2019-08-27 DIAGNOSIS — I4891 Unspecified atrial fibrillation: Secondary | ICD-10-CM

## 2019-08-27 DIAGNOSIS — C9110 Chronic lymphocytic leukemia of B-cell type not having achieved remission: Secondary | ICD-10-CM

## 2019-08-27 DIAGNOSIS — I214 Non-ST elevation (NSTEMI) myocardial infarction: Secondary | ICD-10-CM

## 2019-08-27 NOTE — Telephone Encounter (Signed)
Patient saw Dr Fletcher Anon in hospital States that he wanted patient to get a CBC and a BMP lab drawn at PCP office Patient will not be seeing PCP and they said they can draw labs but they would need an order sent to schedule  Please call to discuss

## 2019-08-27 NOTE — Telephone Encounter (Addendum)
Adv the patient that per her hospital d/c instruction she was to f/u with her pcp and have lab drawn (bmet, cbc) with them within 7-10 days.  Lab orders for a BMP and CBC have been faxed to the patient pcps office [place] with a request for copies of the results to be faxed to our office.   Spoke with the patient and made her aware. She has an appt with her pcp on Fri 08/30/19 and will have the labs drawn at that time.  Patient voiced appreciation for the assistance.

## 2019-08-27 NOTE — Telephone Encounter (Signed)
Patient made aware of Dr. Arida's response and recommendation with verbalized understanding. 

## 2019-09-02 ENCOUNTER — Encounter: Payer: Self-pay | Admitting: Physician Assistant

## 2019-09-02 ENCOUNTER — Other Ambulatory Visit: Payer: Self-pay

## 2019-09-02 ENCOUNTER — Ambulatory Visit (INDEPENDENT_AMBULATORY_CARE_PROVIDER_SITE_OTHER): Payer: Medicare Other | Admitting: Physician Assistant

## 2019-09-02 VITALS — BP 130/70 | HR 72 | Ht 62.5 in | Wt 154.5 lb

## 2019-09-02 DIAGNOSIS — I5032 Chronic diastolic (congestive) heart failure: Secondary | ICD-10-CM

## 2019-09-02 DIAGNOSIS — I252 Old myocardial infarction: Secondary | ICD-10-CM

## 2019-09-02 DIAGNOSIS — E785 Hyperlipidemia, unspecified: Secondary | ICD-10-CM

## 2019-09-02 DIAGNOSIS — I2510 Atherosclerotic heart disease of native coronary artery without angina pectoris: Secondary | ICD-10-CM

## 2019-09-02 DIAGNOSIS — Z8679 Personal history of other diseases of the circulatory system: Secondary | ICD-10-CM

## 2019-09-02 DIAGNOSIS — S5011XA Contusion of right forearm, initial encounter: Secondary | ICD-10-CM

## 2019-09-02 DIAGNOSIS — I340 Nonrheumatic mitral (valve) insufficiency: Secondary | ICD-10-CM

## 2019-09-02 DIAGNOSIS — R7309 Other abnormal glucose: Secondary | ICD-10-CM

## 2019-09-02 DIAGNOSIS — T148XXA Other injury of unspecified body region, initial encounter: Secondary | ICD-10-CM

## 2019-09-02 DIAGNOSIS — I10 Essential (primary) hypertension: Secondary | ICD-10-CM

## 2019-09-02 MED ORDER — LOSARTAN POTASSIUM 25 MG PO TABS: 12.5000 mg | ORAL_TABLET | Freq: Every day | ORAL | 1 refills | Status: DC

## 2019-09-02 NOTE — Patient Instructions (Addendum)
Medication Instructions:  - Your physician has recommended you make the following change in your medication:   1) START losartan 25 mg- take 0.5 tablet (12.5 mg) by mouth once daily    *If you need a refill on your cardiac medications before your next appointment, please call your pharmacy*   Lab Work: - at your return office visit in 3 weeks: BMP  If you have labs (blood work) drawn today and your tests are completely normal, you will receive your results only by: Marland Kitchen MyChart Message (if you have MyChart) OR . A paper copy in the mail If you have any lab test that is abnormal or we need to change your treatment, we will call you to review the results.   Testing/Procedures: - none ordered   Follow-Up: At [facility], you and your health needs are our priority.  As part of our continuing mission to provide you with exceptional heart care, we have created designated Provider Care Teams.  These Care Teams include your primary Cardiologist (physician) and Advanced Practice Providers (APPs -  Physician Assistants and Nurse Practitioners) who all work together to provide you with the care you need, when you need it.  We recommend signing up for the patient portal called "MyChart".  Sign up information is provided on this After Visit Summary.  MyChart is used to connect with patients for Virtual Visits (Telemedicine).  Patients are able to view lab/test results, encounter notes, upcoming appointments, etc.  Non-urgent messages can be sent to your provider as well.   To learn more about what you can do with MyChart, go to [email or web address].    Your next appointment:   3 week(s)  The format for your next appointment:   In Person  Provider:    You may see  or one of the following Advanced Practice Providers on your designated Care Team:    Murray Hodgkins, NP  Christell Faith, PA-C  Marrianne Mood, PA-C    Other Instructions - If you have not heard from the Cardiac Rehab  Department by Monday 8/30, please call the office at [phone] so we may follow up on this for you.

## 2019-09-02 NOTE — Progress Notes (Signed)
Office Visit    Patient Name: Cheryl Briggs Date of Encounter: 09/02/2019  Primary Care Provider:  Joyice Faster, FNP Primary Cardiologist: Dr. Fletcher Anon  Chief Complaint    Chief Complaint  Patient presents with   Follow-up    [facility]; NSTEMI/ s/p stent placements. Meds reviewed by the pt. verbally. Pt. c/o shortness of breath with over exertion.     74 y.o. female with a hx of CLL, arthritis, secondhand smoke exposure, admission 08/2019 with non-ST elevation myocardial infarction with subsequent LHC 8/2021with significant 3v CAD and complicated/staged PCI (05/18/07 dRCA, 08/19/19 dLCx), PCI on 8/6 complicated by no reflow and bradycardia/hypotension/atrial fibrillation with RVR, post procedure right forearm hematoma, HFpEF, hypertension, mitral regurgitation, hyperlipidemia, and here for hospital follow-up.   Past Medical History    Past Medical History:  Diagnosis Date   Allergy    Arthritis    Cancer ([place])    CLL   Past Surgical History:  Procedure Laterality Date   BACK SURGERY     BLADDER REPAIR     carpel tunnell Bilateral    CHOLECYSTECTOMY     COLONOSCOPY  2015   CORONARY STENT INTERVENTION N/A 08/16/2019   Procedure: CORONARY STENT INTERVENTION;  Surgeon: Nelva Bush, MD;  Location: [place] [facility];  Service: Cardiovascular;  Laterality: N/A;   CORONARY STENT INTERVENTION Left 08/19/2019   Procedure: CORONARY STENT INTERVENTION;  Surgeon: Wellington Hampshire, MD;  Location: [place] [facility];  Service: Cardiovascular;  Laterality: Left;   LEFT HEART CATH AND CORONARY ANGIOGRAPHY N/A 08/16/2019   Procedure: LEFT HEART CATH AND CORONARY ANGIOGRAPHY;  Surgeon: Nelva Bush, MD;  Location: [place] [facility];  Service: Cardiovascular;  Laterality: N/A;   SHOULDER SURGERY Bilateral    UPPER EXTREMITY ANGIOGRAPHY Right 08/21/2019   Procedure: UPPER EXTREMITY ANGIOGRAPH;  Surgeon: Algernon Huxley, MD;  Location: [place] [facility];  Service:  Cardiovascular;  Laterality: Right;    Allergies  Allergies  Allergen Reactions   Corticosteroids     Other reaction(s): Headache   Other Other (See Comments)   Sulfa Antibiotics Anaphylaxis   Clarithromycin     Other reaction(s): Other (See Comments) Rectal bleeding    Cymbalta [Duloxetine Hcl] Diarrhea   Prednisone Other (See Comments)    Color changes in face, headache and difficulty walking    History of Present Illness    Cheryl Briggs is a 74 y.o. female with PMH as above. Cheryl Briggs is a 74 yo female with recent 08/2019 admission with newly diagnosed CAD and s/p PCI.    Prior to this admission, she had no known history of heart dz. She reported exposure to secondhand smoke for approximately 20 years via her husband. She did not smoke herself. No previously known history of hypertension.  No alcohol or drug use.  She had intermittent LEE and usually walked in the afternoons to try and stay active, though did note some decreased exercise tolerance. Then, for the 2 weeks leading up to admission, she reported avoiding her normal afternoon walks due to chest discomfort.  This CP eventually progressed to also include discomfort at rest.    The morning of her admission, she felt 10/10 chest pain and pressure in her center of her chest with associated shortness of breath, diaphoresis, nausea, emesis, and dizziness.  She also felt weakness and fatigue. She was uncertain if she had racing heart rate or palpitations.  She had nausea and emesis x1.  This was the most severe in  intensity episode and concerning to her with subsequent presentation to the ED and admission. In the [facility] ED, labs significant for HS Tn 171 with peak at 341. EKG NSR, 71 bpm, T wave inversion in inferior and anterior leads, low voltage QRS, poor R wave progression in precordial leads, QTC 452.  CXR without active dz. Cardiology ws consulted and pt scheduled for LHC.   8/5 showed EF 55-60% with no RWMA  and mild LVH. Also noted was mild MR with mild to moderate aortic valve sclerosis / calcification without evidence of stenosis.  Left heart cath was performed 08/16/2019 and showed severe multivessel CAD, including 80% D2 lesion, sequential 60% proximal and 80% distal left circumflex stenosis, chronic total occlusion of OM1, 50 to 60% mRCA stenosis, and thrombotic 99% dRCA lesion extending into the rPDA.  She had moderately elevated LV filling pressures.  Complex PCI was performed to the dRCA, rPDA, and rPL using DES extending from the dRCA into the rPDA, as well as angioplasty performed of the ostial rPL branch.  Distal RCA PCI was complicated by no reflow in the distal branches.  She subsequently had transient bradycardia and hypotension with development of atrial fibrillation with RVR.  Amiodarone infusion was initiated.  Final angiogram showed 0% stenosis in the distal RCA/RPDA and 10% residual stenosis of the ostial RPL with TIMI II flow.  It was thought that the culprit of her non-ST elevation myocardial infarction was the thrombotic lesion of the distal RCA.  After catheterization, she was transferred to the ICU for ongoing management and continued on tirofiban infusion for 18 hours.  DAPT with ASA and ticagrelor was recommended for 12 months.  Relook catheterization with likely PCI to left circumflex is planned for Monday with Dr. Velva Harman.  Limited echo to reevaluate LVEF following complex PCI with transient no flow involving the distal RCA was recommended.  Aggressive secondary prevention and escalation of statin therapy to atorvastatin 80 mg was also recommended.  It was thought that her atrial fibrillation was 2/2 complicated PCI and mediated by acute ischemia, as well as administration of atropine and norepinephrine.  Norepinephrine was turned off.  She was continued on amiodarone infusion and metoprolol tartrate 25 mg every 6 hours.  Given her elevated filling pressures, she was started on IV  Lasix.  Repeat echo 08/17/2019 showed EF 60 to 65%, no regional wall motion abnormalities, mild concentric left ventricular hypertrophy, G1 DD, elevated LV end-diastolic filling pressure.  Trivial MR was noted.  No aortic regurgitation or stenosis was noted.  She underwent repeat intervention 08/19/2019.  It was noted she had a widely patent RCA stent with normal flow distally.  Unchanged left CAD.  Mildly elevated left ventricular end-diastolic pressure was measured at 14 mmHg.  Successful direct stenting was performed with the distal left circumflex.  Post procedure, she experienced a right forearm hematoma with recommendation for conservative management.  Today, she returns to clinic and notes that she is overall doing well but continues to experience fatigue and dyspnea since her discharge.  She has not yet started cardiac rehab and reports that they have not yet called her.  On discussion of the benefits of cardiac rehab, she is agreeable to complete this course.  She denies any chest pain.  No racing heart rate or palpitations.  No presyncope or syncope.  She does not feel as if she is holding onto any extra fluid.  Overall, she is doing well other than her fatigue and dyspnea.  She reports significant  improvement in her right arm hematoma.  She denies any signs of infection.  Her right femoral puncture site is also inspected today per patient request and shows dressing still in place and clean, dry, and intact.  Given she still has dressing on the site, we removed her Tegaderm to inspect and ensure no hematoma.  Removal of Tegaderm revealed a cottonball with small scab attached, and unfortunately, this scab was attached and thus also removed with a cotton ball.  She received a small Band-Aid but no bleeding was present at the time of bandage removal.  She reports medication compliance.  No signs or symptoms consistent with bleeding.  She is very satisfied with the care that she received at [facility] and eager  to expressed her thanks to the entire heart care team and hospital team involved in her care.  Of note, she does ask about receiving the shingles vaccine as she recovers from her catheterization.  This was conveyed to her primary cardiologist with subsequent recommendation that she can receive her second dose of the shingles vaccine.  Home Medications    Prior to Admission medications   Medication Sig Start Date End Date Taking? Authorizing Provider  aspirin EC 81 MG EC tablet Take 1 tablet (81 mg total) by mouth daily. Swallow whole. 08/19/19  Yes Jennye Boroughs, MD  atorvastatin (LIPITOR) 80 MG tablet Take 1 tablet (80 mg total) by mouth daily. 08/19/19  Yes Jennye Boroughs, MD  Bioflavonoid Products (ESTER C PO) Take 1,000 mg by mouth daily.   Yes [provider]  calcium citrate-vitamin D (CITRACAL+D) 315-200 MG-UNIT tablet Take 1 tablet by mouth 2 (two) times daily.   Yes [provider]  Chlorpheniramine Maleate (CHLOR-TABLETS PO) Take 1 tablet by mouth as needed.    Yes [provider]  Cholecalciferol (VITAMIN D-1000 MAX ST) 25 MCG (1000 UT) tablet Take 3,000 Units by mouth daily.   Yes [provider]  desonide (DESOWEN) 0.05 % ointment desonide 0.05 % topical ointment 02/21/17  Yes [provider]  gabapentin (NEURONTIN) 300 MG capsule Take 300 mg by mouth 3 (three) times daily. 08/26/19  Yes [provider]  metoprolol succinate (TOPROL-XL) 50 MG 24 hr tablet Take 1.5 tablets (75 mg total) by mouth daily. 08/20/19  Yes Jennye Boroughs, MD  Multiple Vitamins-Minerals (MULTIVITAMIN WITH MINERALS) tablet Take 1 tablet by mouth daily.   Yes [provider]  Omeprazole (PRILOSEC PO) Take 1 tablet by mouth daily.    Yes [provider]  ticagrelor (BRILINTA) 90 MG TABS tablet Take 1 tablet (90 mg total) by mouth 2 (two) times daily. 08/19/19 08/18/20 Yes Jennye Boroughs, MD    Review of Systems    She reports fatigue and dyspnea,  though improving from her admission.She denies chest pain, palpitations,, pnd, orthopnea, n, v, dizziness, syncope, weight gain, or early satiety. .   All other systems reviewed and are otherwise negative except as noted above.  Physical Exam    VS:  BP 130/70 (BP Location: Left Arm, Patient Position: Sitting, Cuff Size: Normal)    Pulse 72    Ht 5' 2.5" (1.588 m)    Wt 154 lb 8 oz (70.1 kg)    SpO2 98%    BMI 27.81 kg/m  , BMI Body mass index is 27.81 kg/m. GEN: Well nourished, well developed, in no acute distress. HEENT: normal. Neck: Supple, no JVD, carotid bruits, or masses. Cardiac: RRR, 1/6 systolic murmurs, rubs, or gallops. No clubbing, cyanosis, edema.  Radials/DP/PT 2+ and equal bilaterally.  Right forearm hematoma without bruit and without any sign of infection.  Hematoma appears to have resolved though discoloration still noted along her forearm.  No pain.  Not tender to palpation.  Right femoral puncture/arteriotomy site inspected and without bruit.  Tegaderm and cottonball removed, resulting in subsequent removal of scab but no bleeding from her femoral arteriotomy site.  She was given small Band-Aid to cover this. Respiratory:  Respirations regular and unlabored, clear to auscultation bilaterally. GI: Soft, nontender, nondistended, BS + x 4. MS: no deformity or atrophy. Skin: warm and dry, no rash. Neuro:  Strength and sensation are intact. Psych: Normal affect.  Accessory Clinical Findings    ECG personally reviewed by me today - NSR, previous inferior infarct, 72bpm - no acute changes.  VITALS Reviewed today   Temp Readings from Last 3 Encounters:  08/22/19 97.8 F (36.6 C) (Oral)  04/29/19 (!) 97.5 F (36.4 C) (Tympanic)  02/28/18 97.7 F (36.5 C) (Oral)   BP Readings from Last 3 Encounters:  09/02/19 130/70  08/22/19 128/61  04/29/19 (!) 143/85   Pulse Readings from Last 3 Encounters:  09/02/19 72  08/22/19 68  04/29/19 96    Wt Readings from Last 3  Encounters:  09/02/19 154 lb 8 oz (70.1 kg)  08/22/19 149 lb 11.2 oz (67.9 kg)  04/29/19 158 lb 2.9 oz (71.7 kg)     LABS  reviewed today   Lab Results  Component Value Date   WBC 12.8 (H) 08/22/2019   HGB 12.4 08/22/2019   HCT 37.1 08/22/2019   MCV 97.9 08/22/2019   PLT 294 08/22/2019   Lab Results  Component Value Date   CREATININE 0.79 08/22/2019   BUN 9 08/22/2019   NA 136 08/22/2019   K 4.0 08/22/2019   CL 103 08/22/2019   CO2 24 08/22/2019   Lab Results  Component Value Date   ALT 29 10/06/2017   AST 36 10/06/2017   ALKPHOS 75 10/06/2017   BILITOT 0.6 10/06/2017   Lab Results  Component Value Date   CHOL 181 08/15/2019   HDL 66 08/15/2019   LDLCALC 82 08/15/2019   TRIG 164 (H) 08/15/2019   CHOLHDL 2.7 08/15/2019    Lab Results  Component Value Date   HGBA1C 6.0 (H) 08/15/2019   Lab Results  Component Value Date   TSH 2.048 08/16/2019     STUDIES/PROCEDURES reviewed today    2D echo 08/15/2019: 1. Left ventricular ejection fraction, by estimation, is 55 to 60%. The  left ventricle has normal function. The left ventricle has no regional  wall motion abnormalities. There is mild left ventricular hypertrophy.  Left ventricular diastolic parameters  were normal.  2. Right ventricular systolic function is normal. The right ventricular  size is normal. There is normal pulmonary artery systolic pressure.  3. The mitral valve is normal in structure. Mild mitral valve  regurgitation. No evidence of mitral stenosis.  4. The aortic valve is abnormal. Aortic valve regurgitation is not  visualized. Mild to moderate aortic valve sclerosis/calcification is  present, without any evidence of aortic stenosis.  __________   LHC 08/16/2019: A drug-eluting stent was successfully placed using a STENT SYNERGY DES 2.25X20.  A stent was successfully placed.  Conclusions: 1. Severe multivessel coronary artery disease, including 80% D2 lesion, sequential 60%  proximal and 80% distal LCx stenoses, chronic total occlusion of OM1, 50-60% mid RCA stenosis, and thrombotic 99% distal RCA lesion extending into the rPDA. 2.  Moderately elevated left ventricular filling pressure. 3. Complex PCI to distal RCA, rPDA, and rPLA using Synergy 2.25 x 20 mm drug-eluting stent extending from the distal RCA into the rPDA, as well as angioplasty of the ostial rPL branch. Intervention was complicated by no-reflow with transient hypotension/bradycardia and subsequent development of atrial fibrillation with rapid ventricular response. Final angiogram shows 0% residual stenosis in the distal RCA/rPDA and 10% residual stenosis at ostial rPL with TIMI-2 flow.  Recommendations: 1. Transfer to ICU for aggressive medical therapy and post-PCI monitoring. 2. Continue tirofiban infusion for 18 hours. 3. Dual antiplatelet therapy with aspirin and ticagrelor for at least 12 months. 4. Titrate nitroglycerine infusion for relief of chest pain. 5. Continue amiodarone infusion for rate control and hopefully conversion to sinus rhythm. If patient remains in atrial fibrillation tomorrow, IV heparin should be started (will defer heparin at this time given tirofiban infusion). 6. Aggressive secondary prevention. 7. Anticipate relook catheterization on Monday with PCI to the LCx. I favor medical therapy for D2, given relatively small vessel size and need to stent back to the LAD, were intervention undertaken. __________  Limited 2D echo 08/17/2019: 1. Left ventricular ejection fraction, by estimation, is 60 to 65%. The  left ventricle has normal function. The left ventricle has no regional  wall motion abnormalities. There is mild concentric left ventricular  hypertrophy. Left ventricular diastolic  parameters are consistent with Grade I diastolic dysfunction (impaired  relaxation). Elevated left ventricular end-diastolic pressure.  2. Right ventricular systolic function is normal. The  right ventricular  size is normal.  3. The mitral valve is normal in structure. Trivial mitral valve  regurgitation. No evidence of mitral stenosis.  4. The aortic valve is tricuspid. Aortic valve regurgitation is not  visualized. No aortic stenosis is present. __________  LHC 08/19/2019:  2nd Diag lesion is 80% stenosed.  1st Mrg lesion is 100% stenosed.  Prox Cx to Mid Cx lesion is 60% stenosed.  Dist Cx lesion is 80% stenosed.  Mid RCA lesion is 55% stenosed.  RPAV lesion is 10% stenosed.  Balloon angioplasty was performed.  Previously placed Dist RCA drug-eluting stents are widely patent with 0% stenosed side branch in RPDA.  Post intervention, there is a 0% residual stenosis.  A drug-eluting stent was successfully placed using a STENT RESOLUTE ONYX 2.75X18.  1. Widely patent RCA stent with normal flow distally. Unchanged left coronary artery disease. 2. Mildly elevated left ventricular end-diastolic pressure at 14 mmHg. 3. Successful direct stenting of the distal left circumflex.  Recommendations: Continue dual antiplatelet therapy for 1 year. Aggressive treatment of risk factors. If the patient remains stable, she can potentially be discharged home in the afternoon.  Assessment & Plan    CAD s/p complicated PCI --No CP.  She reports ongoing dyspnea and fatigue, present since discharge, though she does note these are improving very slowly over time.   --Admitted for non-ST elevation myocardial infarction and found to have multivessel CAD on catheterization with culprit lesion felt to be thrombotic distal RCA.  She underwent PCI to the distal RCA, RPDA, and RPL.  This was complicated by no reflow after stent post dilation with resultant hypotension, bradycardia, and ultimate development of atrial fibrillation with RVR.  Repeat limited echo showed normal EF.  Repeat cath showed widely patent RCA stent with normal flow distally.  PCI of the distal left circumflex  was performed without complications.   --Right forearm inspected with hematoma resolving.  She denies any pain or signs  of infection.  No bruit appreciated.  Radial pulse strong.  Right femoral arteriotomy site without bruit.  Removed her Tegaderm and cottonball over her femoral arteriotomy site today and provided her with a Band-Aid, though no bleeding noted at the time of her removal. --Given her symptoms above, cardiac rehab encouraged.  Referral checked and entered into the computer.  She has yet to receive a call.  We will look into this for her.  She will let us know if her dyspnea or fatigue worsens; however, as above, she does note that it is improving.   --Continue DAPT with ASA and ticagrelor for at least 12 months.  Continue Toprol.  Continue secondary prevention of risk factors, including statin therapy.  Atrial fibrillation with RVR, s/p PCI --Denies any racing heart rate or palpitations. A. fib with RVR developed during complicated PCI and thought likely mediated by acute ischemia and administration of atropine and norepinephrine.  She was discharged on oral amiodarone, which is recommended to be continued until approximately September 2021.  Continue amiodarone until that time with current Toprol.  Currently rate controlled and maintaining NSR.  No recommendation for anticoagulation during her admission and given her likely etiology of her atrial fibrillation was 2/2 acute ischemia during catheterization.  Chronic HFpEF --Euvolemic and well compensated on exam.  LVEF moderately elevated at the time of catheterization, though noted to be normal on her previous echo. She received IV lasix 40mg  x1 and was not discharged on a diuretic with no current indication for diuretic today.  Estimated dry weight 69-70kg based on admission.  She wonders if her dyspnea and fatigue are in part due to deconditioning from hospitalization and also her cardiac event and cardiac rehab encouraged with patient  understanding.  She will let us know if her symptoms worsen, though she does note that they are improving symptoms.  Given her mildly elevated pressures today, initiated low-dose losartan 12.5 mg daily with recheck of BMET in approximately 2-3 weeks.  Low-salt diet and fluid restriction under 2 L recommended.  HLD --LDL 82 at the time of her admission.  Continue high intensity atorvastatin 80 mg daily for target LDL less than 70.  She was switched from simvastatin to atorvastatin during her admission; therefore, she will need follow-up LDL/LFTs approximately 10/2019.  Hypertension -Recommend BP goal 130/80 or lower.  BP today 130/70.  Will start low-dose losartan 12.5 mg with recheck of BMET before her next visit.  Mitral regurgitation --Mild to moderate by initial echo.  Continue to monitor with periodic echo.  Right arm hematoma --Inspected today.  Not tender to palpation.  No bruit.  Resolving.  Right femoral arteriotomy site --Inspected today.  Not tender to palpation.  No signs of infection.  Dressing removed without signs of bleeding.  Band-Aid provided.  Vaccine question --Message sent to triage that consulted with primary cardiologist -no contraindication to her receiving the second dose of her shingles vaccine at this time.  Elevated A1C --During admission, A1c elevated at 6.0.  Recommend glycemic control and management per PCP.  Started on low-dose losartan as above given comorbid HTN.  Recheck BMET at follow-up.  Medication changes: Losartan 12.5 mg daily with repeat BMET  Labs ordered: Repeat BMET to check renal function and electrolytes following initiation of losartan Studies / Imaging ordered: None.  Future considerations: Repeat LDL/LFTs approximately 10/2019.  Discontinue amiodarone approximately 09/2019. Disposition: RTC 2 to 3 weeks.   Arvil Chaco, PA-C 09/02/2019

## 2019-09-04 ENCOUNTER — Telehealth: Payer: Self-pay

## 2019-09-04 NOTE — Telephone Encounter (Addendum)
Patient made aware of the info below. Patient verbalized understanding and voiced appreciation for the call.  Per Marrianne Mood, [place] 09/02/19 office note: "Of note, she does ask about receiving the shingles vaccine as she recovers from her catheterization.  This was conveyed to her primary cardiologist with subsequent recommendation that she can receive her second dose of the shingles vaccine."

## 2019-09-04 NOTE — Telephone Encounter (Signed)
-----   Message from Valora Corporal, RN sent at 09/04/2019  8:05 AM EDT ----- Regarding: FW: Shingles vaccine!  ----- Message ----- From: Arvil Chaco, PA-C Sent: 09/03/2019   4:45 PM EDT To: [facility] Triage Subject: Shingles vaccine!                              Please disregard previous [facility] vaccine message. I selected the wrong pt.  This pt needs called, as Dr. Fletcher Anon says she can receive the Shingles vaccine. (second dose). Thank you!

## 2019-09-19 ENCOUNTER — Other Ambulatory Visit: Payer: Self-pay

## 2019-09-19 ENCOUNTER — Encounter: Payer: Medicare Other | Attending: Cardiovascular Disease

## 2019-09-19 DIAGNOSIS — I252 Old myocardial infarction: Secondary | ICD-10-CM | POA: Insufficient documentation

## 2019-09-19 DIAGNOSIS — Z7982 Long term (current) use of aspirin: Secondary | ICD-10-CM | POA: Insufficient documentation

## 2019-09-19 DIAGNOSIS — Z955 Presence of coronary angioplasty implant and graft: Secondary | ICD-10-CM | POA: Insufficient documentation

## 2019-09-19 DIAGNOSIS — Z79899 Other long term (current) drug therapy: Secondary | ICD-10-CM | POA: Insufficient documentation

## 2019-09-19 DIAGNOSIS — Z7901 Long term (current) use of anticoagulants: Secondary | ICD-10-CM | POA: Insufficient documentation

## 2019-09-19 DIAGNOSIS — I214 Non-ST elevation (NSTEMI) myocardial infarction: Secondary | ICD-10-CM

## 2019-09-19 NOTE — Progress Notes (Signed)
Virtual Visit completed. Patient informed on EP and RD appointment and 6 Minute walk test. Patient also informed of patient health questionnaires on My Chart. Patient Verbalizes understanding. Visit diagnosis can be found in [facility] 08/15/2019. 

## 2019-09-20 ENCOUNTER — Other Ambulatory Visit: Payer: Self-pay | Admitting: Cardiovascular Disease

## 2019-09-20 MED ORDER — TICAGRELOR 90 MG PO TABS: 90.0000 mg | ORAL_TABLET | Freq: Two times a day (BID) | ORAL | 2 refills | Status: DC

## 2019-09-20 MED ORDER — ATORVASTATIN CALCIUM 80 MG PO TABS: 80.0000 mg | ORAL_TABLET | Freq: Every day | ORAL | 2 refills | Status: DC

## 2019-09-20 NOTE — Telephone Encounter (Signed)
*  STAT* If patient is at the pharmacy, call can be transferred to refill team.   1. Which medications need to be refilled? (please list name of each medication and dose if known)  Brilinta 90 MG 1 tablet 2 times daily Atorvastatin (LIPITOR) 80 MG 1 tablet daily   2. Which pharmacy/location (including street and city if local pharmacy) is medication to be sent to? [facility] in [place]   3. Do they need a 30 day or 90 day supply? 30 day

## 2019-09-24 ENCOUNTER — Encounter: Payer: Self-pay | Admitting: Physician Assistant

## 2019-09-24 ENCOUNTER — Other Ambulatory Visit: Payer: Self-pay

## 2019-09-24 ENCOUNTER — Ambulatory Visit (INDEPENDENT_AMBULATORY_CARE_PROVIDER_SITE_OTHER): Payer: Medicare Other | Admitting: Physician Assistant

## 2019-09-24 VITALS — BP 130/72 | HR 84 | Ht 62.5 in | Wt 151.5 lb

## 2019-09-24 DIAGNOSIS — I252 Old myocardial infarction: Secondary | ICD-10-CM

## 2019-09-24 DIAGNOSIS — I480 Paroxysmal atrial fibrillation: Secondary | ICD-10-CM

## 2019-09-24 DIAGNOSIS — I4891 Unspecified atrial fibrillation: Secondary | ICD-10-CM

## 2019-09-24 DIAGNOSIS — C9110 Chronic lymphocytic leukemia of B-cell type not having achieved remission: Secondary | ICD-10-CM

## 2019-09-24 DIAGNOSIS — E785 Hyperlipidemia, unspecified: Secondary | ICD-10-CM | POA: Diagnosis not present

## 2019-09-24 DIAGNOSIS — I5032 Chronic diastolic (congestive) heart failure: Secondary | ICD-10-CM

## 2019-09-24 DIAGNOSIS — I2510 Atherosclerotic heart disease of native coronary artery without angina pectoris: Secondary | ICD-10-CM | POA: Diagnosis not present

## 2019-09-24 DIAGNOSIS — Z8679 Personal history of other diseases of the circulatory system: Secondary | ICD-10-CM

## 2019-09-24 DIAGNOSIS — I10 Essential (primary) hypertension: Secondary | ICD-10-CM | POA: Diagnosis not present

## 2019-09-24 DIAGNOSIS — I340 Nonrheumatic mitral (valve) insufficiency: Secondary | ICD-10-CM

## 2019-09-24 DIAGNOSIS — R7309 Other abnormal glucose: Secondary | ICD-10-CM

## 2019-09-24 NOTE — Progress Notes (Signed)
Office Visit    Patient Name: Cheryl Briggs Date of Encounter: 09/24/2019  Primary Care Provider:  Joyice Faster, [place] Primary Cardiologist: Dr. Fletcher Anon  Chief Complaint    Chief Complaint  Patient presents with  . office visit    3 week F/U after starting losartan; Meds verbally reviewed with patient.    74 y.o. female with a hx of CLL, arthritis, secondhand smoke exposure, admission 08/2019 with non-ST elevation myocardial infarction with subsequent LHC 8/2021with significant 3v CAD and complicated/staged PCI (06/13/87 dRCA, 08/19/19 dLCx), PCI on 8/6 complicated by no reflow and bradycardia/hypotension/atrial fibrillation with RVR, post procedure right forearm hematoma, HFpEF, hypertension, mitral regurgitation, hyperlipidemia, and here for follow-up after initiation of losartan.   Past Medical History    Past Medical History:  Diagnosis Date  . Allergy   . Arthritis   . Cancer ([place])    CLL   Past Surgical History:  Procedure Laterality Date  . BACK SURGERY    . BLADDER REPAIR    . carpel tunnell Bilateral   . CHOLECYSTECTOMY    . COLONOSCOPY  2015  . CORONARY STENT INTERVENTION N/A 08/16/2019   Procedure: CORONARY STENT INTERVENTION;  Surgeon: Nelva Bush, MD;  Location: [facility];  Service: Cardiovascular;  Laterality: N/A;  . CORONARY STENT INTERVENTION Left 08/19/2019   Procedure: CORONARY STENT INTERVENTION;  Surgeon: Wellington Hampshire, MD;  Location: [facility];  Service: Cardiovascular;  Laterality: Left;  . LEFT HEART CATH AND CORONARY ANGIOGRAPHY N/A 08/16/2019   Procedure: LEFT HEART CATH AND CORONARY ANGIOGRAPHY;  Surgeon: Nelva Bush, MD;  Location: [facility];  Service: Cardiovascular;  Laterality: N/A;  . SHOULDER SURGERY Bilateral   . UPPER EXTREMITY ANGIOGRAPHY Right 08/21/2019   Procedure: UPPER EXTREMITY ANGIOGRAPH;  Surgeon: Algernon Huxley, MD;  Location: [facility];  Service: Cardiovascular;  Laterality:  Right;    Allergies  Allergies  Allergen Reactions  . Corticosteroids     Other reaction(s): Headache  . Other Other (See Comments)  . Sulfa Antibiotics Anaphylaxis  . Clarithromycin     Other reaction(s): Other (See Comments) Rectal bleeding   . Cymbalta [Duloxetine Hcl] Diarrhea  . Prednisone Other (See Comments)    Color changes in face, headache and difficulty walking    History of Present Illness    Cheryl Briggs is a 74 y.o. female with PMH as above. Cheryl Briggs is a 74 yo female with recent 08/2019 admission with newly diagnosed CAD and s/p PCI.    Prior to this admission, she had no known history of heart dz. She reported exposure to secondhand smoke for approximately 20 years via her husband. She did not smoke herself. No previously known history of hypertension.  No alcohol or drug use.  She had intermittent LEE and usually walked in the afternoons to try and stay active, though did note some decreased exercise tolerance. Then, for the 2 weeks leading up to admission, she reported avoiding her normal afternoon walks due to chest discomfort.  This CP eventually progressed to also include discomfort at rest.    The morning of her admission, she felt 10/10 chest pain and pressure in her center of her chest with associated shortness of breath, diaphoresis, nausea, emesis, and dizziness.  She also felt weakness and fatigue. She was uncertain if she had racing heart rate or palpitations.  She had nausea and emesis x1.  This was the most severe in intensity episode and concerning to  her with subsequent presentation to the ED and admission. In the [facility] ED, labs significant for HS Tn 171 with peak at 341. EKG NSR, 71 bpm, T wave inversion in inferior and anterior leads, low voltage QRS, poor R wave progression in precordial leads, QTC 452.  CXR without active dz. Cardiology was consulted and pt scheduled for LHC.   8/5 showed EF 55-60% with no RWMA and mild LVH. Also noted was  mild MR with mild to moderate aortic valve sclerosis / calcification without evidence of stenosis.  Left heart cath was performed 08/16/2019 and showed severe multivessel CAD, including 80% D2 lesion, sequential 60% proximal and 80% distal left circumflex stenosis, chronic total occlusion of OM1, 50 to 60% mRCA stenosis, and thrombotic 99% dRCA lesion extending into the rPDA.  She had moderately elevated LV filling pressures.  Complex PCI was performed to the dRCA, rPDA, and rPL using DES extending from the dRCA into the rPDA, as well as angioplasty performed of the ostial rPL branch.  Distal RCA PCI was complicated by no reflow in the distal branches.  She subsequently had transient bradycardia and hypotension with development of atrial fibrillation with RVR.  Amiodarone infusion was initiated.  Final angiogram showed 0% stenosis in the distal RCA/RPDA and 10% residual stenosis of the ostial RPL. It was thought that the culprit of her non-ST elevation myocardial infarction was the thrombotic lesion of the distal RCA.  After catheterization, she was transferred to the ICU for ongoing management and continued on tirofiban infusion for 18 hours.  DAPT with ASA and ticagrelor was recommended for 12 months.  Relook catheterization with likely PCI to left circumflex was scheduled.  Limited echo to reevaluate LVEF following complex PCI with transient no flow involving the distal RCA was recommended.  Aggressive secondary prevention and escalation of statin therapy to atorvastatin 80 mg was also recommended. It was thought that her atrial fibrillation was 2/2 complicated PCI and mediated by acute ischemia, as well as administration of atropine and norepinephrine.  Norepinephrine was turned off.  She was continued on amiodarone infusion and metoprolol tartrate 25 mg every 6 hours.  Given her elevated filling pressures, she was started on IV Lasix.  Repeat echo 08/17/2019 showed EF 60 to 65%, no regional wall motion  abnormalities, mild concentric left ventricular hypertrophy, G1 DD, elevated LV end-diastolic filling pressure.  Trivial MR was noted.  No aortic regurgitation or stenosis was noted.  She underwent repeat intervention 08/19/2019.  It was noted she had a widely patent RCA stent with normal flow distally.  Unchanged left CAD.  Mildly elevated left ventricular end-diastolic pressure was measured at 14 mmHg.  Successful direct stenting was performed with the distal left circumflex.  Post procedure, she experienced a right forearm hematoma with recommendation for conservative management.  After discharge, she was seen in clinic 09/02/2019 and noted she was overall doing well; however, she noted concern regarding continued fatigue and dyspnea since her intervention.  She had not yet started cardiac rehab with orders placed.  No chest pain, racing heart rate, palpitations.  She reported significant improvement in her right arm hematoma.  Right femoral arteriotomy site without signs of erythema, warmth, infection, or hematoma.  She was very satisfied with the care that she received at [facility] and eager to express her thanks to the entire [facility] team and hospital team involved in her care.  She intended to get the shingles vaccine which was relayed to be okay from a cardiac standpoint.  Today,  09/24/2019, she returns to clinic and continues to note dyspnea and fatigue since her intervention.  She has not yet started cardiac rehab but is scheduled to start within the next few days.  She notes that she is currently taking a nap in the a.m. and afternoon, which is unusual for her.  No recent chest pain, racing heart rate, palpitations, presyncope, or syncope.  No orthopnea, PND, abdominal distention, or early satiety.  She does note dyspnea with walking long distances but did not experience any dyspnea on her walk into the office.  She is drinking 2 to 3 glasses of water daily and a 32 ounce cup.  She reports continued  improvement and resolution of her right arm hematoma.  Right radial and right femoral arteriotomy sites healed and without pain or infection.  She has been tolerating the losartan well with blood pressure today 130/72 in the setting of joint pain.  She also notes a recent SBP 140s and does wonder the extent to which her pain is contributing to these pressures.  She intends to start tai chi from a chair, consisting of slow stretching movements.  She reports that, since discontinuing her Voltaren gel, she has continued to experience joint pain.  She has tried several over-the-counter and prescribed pain relief options, including recently prescribed tramadol-acetaminophen 37.5 every 6 hours as needed.  She reports that she will soon get the flu vaccine.  She will start cardiac rehab 09/25/2019.  She is hopeful that this will improve her symptoms of fatigue.  Home Medications    Prior to Admission medications   Medication Sig Start Date End Date Taking? Authorizing Provider  aspirin EC 81 MG EC tablet Take 1 tablet (81 mg total) by mouth daily. Swallow whole. 08/19/19  Yes Jennye Boroughs, MD  atorvastatin (LIPITOR) 80 MG tablet Take 1 tablet (80 mg total) by mouth daily. 08/19/19  Yes Jennye Boroughs, MD  Bioflavonoid Products (ESTER C PO) Take 1,000 mg by mouth daily.   Yes [provider]  calcium citrate-vitamin D (CITRACAL+D) 315-200 MG-UNIT tablet Take 1 tablet by mouth 2 (two) times daily.   Yes [provider]  Chlorpheniramine Maleate (CHLOR-TABLETS PO) Take 1 tablet by mouth as needed.    Yes [provider]  Cholecalciferol (VITAMIN D-1000 MAX ST) 25 MCG (1000 UT) tablet Take 3,000 Units by mouth daily.   Yes [provider]  desonide (DESOWEN) 0.05 % ointment desonide 0.05 % topical ointment 02/21/17  Yes [provider]  gabapentin (NEURONTIN) 300 MG capsule Take 300 mg by mouth 3 (three) times daily. 08/26/19  Yes [provider]  metoprolol  succinate (TOPROL-XL) 50 MG 24 hr tablet Take 1.5 tablets (75 mg total) by mouth daily. 08/20/19  Yes Jennye Boroughs, MD  Multiple Vitamins-Minerals (MULTIVITAMIN WITH MINERALS) tablet Take 1 tablet by mouth daily.   Yes [provider]  Omeprazole (PRILOSEC PO) Take 1 tablet by mouth daily.    Yes [provider]  ticagrelor (BRILINTA) 90 MG TABS tablet Take 1 tablet (90 mg total) by mouth 2 (two) times daily. 08/19/19 08/18/20 Yes Jennye Boroughs, MD    Review of Systems    She reports fatigue and dyspnea, without change since her last visit. She denies chest pain, palpitations, pnd, orthopnea, n, v, dizziness, syncope, weight gain, or early satiety.  She reports joint pain.  All other systems reviewed and are otherwise negative except as noted above.  Physical Exam    VS:  BP 130/72 (BP Location:  Left Arm, Patient Position: Sitting, Cuff Size: Normal)   Pulse 84   Ht 5' 2.5" (1.588 m)   Wt 151 lb 8 oz (68.7 kg)   SpO2 97%   BMI 27.27 kg/m  , BMI Body mass index is 27.27 kg/m. GEN: Well nourished, well developed, in no acute distress. HEENT: normal. Neck: Supple, no JVD, carotid bruits, or masses. Cardiac: RRR, 1/6 systolic murmurs, rubs, or gallops. No clubbing, cyanosis, edema.  Radials/DP/PT 2+ and equal bilaterally.     Respiratory:  Respirations regular and unlabored, clear to auscultation bilaterally. GI: Soft, nontender, nondistended, BS + x 4. MS: no deformity or atrophy. Skin: warm and dry, no rash. Neuro:  Strength and sensation are intact. Psych: Normal affect.  Accessory Clinical Findings    ECG personally reviewed by me today -deferred, no EKG- no acute changes.  VITALS Reviewed today   Temp Readings from Last 3 Encounters:  08/22/19 97.8 F (36.6 C) (Oral)  04/29/19 (!) 97.5 F (36.4 C) (Tympanic)  02/28/18 97.7 F (36.5 C) (Oral)   BP Readings from Last 3 Encounters:  09/24/19 130/72  09/02/19 130/70  08/22/19 128/61   Pulse Readings from  Last 3 Encounters:  09/24/19 84  09/02/19 72  08/22/19 68    Wt Readings from Last 3 Encounters:  09/24/19 151 lb 8 oz (68.7 kg)  09/02/19 154 lb 8 oz (70.1 kg)  08/22/19 149 lb 11.2 oz (67.9 kg)     LABS  reviewed today   Lab Results  Component Value Date   WBC 12.8 (H) 08/22/2019   HGB 12.4 08/22/2019   HCT 37.1 08/22/2019   MCV 97.9 08/22/2019   PLT 294 08/22/2019   Lab Results  Component Value Date   CREATININE 0.79 08/22/2019   BUN 9 08/22/2019   NA 136 08/22/2019   K 4.0 08/22/2019   CL 103 08/22/2019   CO2 24 08/22/2019   Lab Results  Component Value Date   ALT 29 10/06/2017   AST 36 10/06/2017   ALKPHOS 75 10/06/2017   BILITOT 0.6 10/06/2017   Lab Results  Component Value Date   CHOL 181 08/15/2019   HDL 66 08/15/2019   LDLCALC 82 08/15/2019   TRIG 164 (H) 08/15/2019   CHOLHDL 2.7 08/15/2019    Lab Results  Component Value Date   HGBA1C 6.0 (H) 08/15/2019   Lab Results  Component Value Date   TSH 2.048 08/16/2019     STUDIES/PROCEDURES reviewed today   2D echo 08/15/2019: 1. Left ventricular ejection fraction, by estimation, is 55 to 60%. The  left ventricle has normal function. The left ventricle has no regional  wall motion abnormalities. There is mild left ventricular hypertrophy.  Left ventricular diastolic parameters  were normal.  2. Right ventricular systolic function is normal. The right ventricular  size is normal. There is normal pulmonary artery systolic pressure.  3. The mitral valve is normal in structure. Mild mitral valve  regurgitation. No evidence of mitral stenosis.  4. The aortic valve is abnormal. Aortic valve regurgitation is not  visualized. Mild to moderate aortic valve sclerosis/calcification is  present, without any evidence of aortic stenosis.  __________   LHC 08/16/2019: A drug-eluting stent was successfully placed using a STENT SYNERGY DES 2.25X20.  A stent was successfully  placed. Conclusions: 1. Severe multivessel coronary artery disease, including 80% D2 lesion, sequential 60% proximal and 80% distal LCx stenoses, chronic total occlusion of OM1, 50-60% mid RCA stenosis, and thrombotic 99% distal RCA lesion  extending into the rPDA. 2. Moderately elevated left ventricular filling pressure. 3. Complex PCI to distal RCA, rPDA, and rPLA using Synergy 2.25 x 20 mm drug-eluting stent extending from the distal RCA into the rPDA, as well as angioplasty of the ostial rPL branch. Intervention was complicated by no-reflow with transient hypotension/bradycardia and subsequent development of atrial fibrillation with rapid ventricular response. Final angiogram shows 0% residual stenosis in the distal RCA/rPDA and 10% residual stenosis at ostial rPL with TIMI-2 flow. Recommendations: 1. Transfer to ICU for aggressive medical therapy and post-PCI monitoring. 2. Continue tirofiban infusion for 18 hours. 3. Dual antiplatelet therapy with aspirin and ticagrelor for at least 12 months. 4. Titrate nitroglycerine infusion for relief of chest pain. 5. Continue amiodarone infusion for rate control and hopefully conversion to sinus rhythm. If patient remains in atrial fibrillation tomorrow, IV heparin should be started (will defer heparin at this time given tirofiban infusion). 6. Aggressive secondary prevention. 7. Anticipate relook catheterization on Monday with PCI to the LCx. I favor medical therapy for D2, given relatively small vessel size and need to stent back to the LAD, were intervention undertaken. __________  Limited 2D echo 08/17/2019: 1. Left ventricular ejection fraction, by estimation, is 60 to 65%. The  left ventricle has normal function. The left ventricle has no regional  wall motion abnormalities. There is mild concentric left ventricular  hypertrophy. Left ventricular diastolic  parameters are consistent with Grade I diastolic dysfunction (impaired  relaxation).  Elevated left ventricular end-diastolic pressure.  2. Right ventricular systolic function is normal. The right ventricular  size is normal.  3. The mitral valve is normal in structure. Trivial mitral valve  regurgitation. No evidence of mitral stenosis.  4. The aortic valve is tricuspid. Aortic valve regurgitation is not  visualized. No aortic stenosis is present. __________  LHC 08/19/2019:  2nd Diag lesion is 80% stenosed.  1st Mrg lesion is 100% stenosed.  Prox Cx to Mid Cx lesion is 60% stenosed.  Dist Cx lesion is 80% stenosed.  Mid RCA lesion is 55% stenosed.  RPAV lesion is 10% stenosed.  Balloon angioplasty was performed.  Previously placed Dist RCA drug-eluting stents are widely patent with 0% stenosed side branch in RPDA.  Post intervention, there is a 0% residual stenosis.  A drug-eluting stent was successfully placed using a STENT RESOLUTE ONYX 2.75X18.  1. Widely patent RCA stent with normal flow distally. Unchanged left coronary artery disease. 2. Mildly elevated left ventricular end-diastolic pressure at 14 mmHg. 3. Successful direct stenting of the distal left circumflex. Recommendations: Continue dual antiplatelet therapy for 1 year. Aggressive treatment of risk factors. If the patient remains stable, she can potentially be discharged home in the afternoon.  Assessment & Plan    CAD s/p complicated PCI --No CP.  She reports dyspnea with long distances and ongoing fatigue, present since discharge.  Her fatigue is unchanged from her previous visit. --S/p NSTEMI with cath showing multivessel CAD. Culprit lesion felt to be thrombotic distal RCA with PCI to the distal RCA, RPDA, and RPL, complicated by no reflow after stent post dilation with resultant hypotension, bradycardia, and ultimate development of atrial fibrillation with RVR.  Repeat cath with patent RCA stent and subsequent PCI of the dLCx.   --Repeat limited echo showed normal EF 55 to 60%.   --Given her ongoing fatigue, cardiac rehab encouraged with patient scheduled to begin 09/25/2019.  She will keep [place] posted regarding her symptoms and call the office if worsening fatigue or new/concerning  symptoms.  Continue Toprol and DAPT with ASA and ticagrelor for at least 12 months. Continue secondary prevention of risk factors, including statin therapy.  Atrial fibrillation with RVR, s/p PCI --Denies any racing heart rate or palpitations. A. fib with RVR developed during complicated PCI and thought likely mediated by acute ischemia and administration of atropine and norepinephrine.  She was discharged on oral amiodarone, which has since been discontinued.  She is maintaining NSR.  Continue Toprol for rate control.  No recommendation for anticoagulation during her admission and given her likely etiology of her atrial fibrillation was 2/2 acute ischemia during catheterization.  Chronic HFpEF --Euvolemic and well compensated on exam.  LVEF moderately elevated at the time of catheterization, though noted to be normal on her previous echo. She received IV lasix 53m x1 and was not discharged on a diuretic with no current indication for diuretic today.  Estimated dry weight 69-70kg based on admission.  Given her mildly elevated pressures at previous clinic visit, initiated low-dose losartan 12.5 mg daily with recheck of BMET today.  We will plan to escalate her losartan if BP remains elevated at follow-up; however, given her level of pain, we will hold off on making this medication change for now.  She will call the office if BP consistently elevated over 130/80.  Low-salt diet and fluid restriction under 2 L recommended.  HLD --LDL 82 at the time of her admission.  Continue high intensity atorvastatin 80 mg daily for target LDL less than 70.  She was switched from simvastatin to atorvastatin during her admission; therefore, we will recheck LFTs and LDL today.    Hypertension -Recommend BP goal 130/80  or lower.  BP today borderline; however, given her pain, we will hold off on increasing losartan 12.5 mg with recheck of BMET today.  If BP remains elevated at home, she will call the office.  If BP remains elevated at RTC, we will need to consider adjustment of her antihypertensive therapy.  Mitral regurgitation --Mild to moderate by initial echo.  Continue to monitor with periodic echo.  Elevated A1C --During admission, A1c elevated at 6.0.  Recommend glycemic control and management per PCP.  Started on low-dose losartan as above given comorbid HTN.    Medication changes: None.  Labs ordered: Repeat BMET, lipids, LFTs. Studies / Imaging ordered: None.  Future considerations: Repeat LDL/LFTs with medication changes, if indicated.  Increase losartan if BP still elevated at RTC. Disposition: RTC 1 month   JArvil Chaco PA-C 09/24/2019

## 2019-09-24 NOTE — Patient Instructions (Signed)
Medication Instructions:  Your physician recommends that you continue on your current medications as directed. Please refer to the Current Medication list given to you today.  *If you need a refill on your cardiac medications before your next appointment, please call your pharmacy*   Lab Work: Your physician recommends that you have lab work today(lipid with direct, [place]  If you have labs (blood work) drawn today and your tests are completely normal, you will receive your results only by: Marland Kitchen MyChart Message (if you have MyChart) OR . A paper copy in the mail If you have any lab test that is abnormal or we need to change your treatment, we will call you to review the results.   Testing/Procedures: none ordered   Follow-Up: At [facility], you and your health needs are our priority.  As part of our continuing mission to provide you with exceptional heart care, we have created designated Provider Care Teams.  These Care Teams include your primary Cardiologist (physician) and Advanced Practice Providers (APPs -  Physician Assistants and Nurse Practitioners) who all work together to provide you with the care you need, when you need it.  We recommend signing up for the patient portal called "MyChart".  Sign up information is provided on this After Visit Summary.  MyChart is used to connect with patients for Virtual Visits (Telemedicine).  Patients are able to view lab/test results, encounter notes, upcoming appointments, etc.  Non-urgent messages can be sent to your provider as well.   To learn more about what you can do with MyChart, go to [email or web address].    Your next appointment:   1 month(s)  The format for your next appointment:   In Person  Provider:     You may see Fletcher Anon or Marrianne Mood, PA-C

## 2019-09-25 ENCOUNTER — Encounter: Payer: Medicare Other | Admitting: *Deleted

## 2019-09-25 VITALS — Ht 61.9 in | Wt 150.4 lb

## 2019-09-25 DIAGNOSIS — Z7982 Long term (current) use of aspirin: Secondary | ICD-10-CM | POA: Diagnosis not present

## 2019-09-25 DIAGNOSIS — Z7901 Long term (current) use of anticoagulants: Secondary | ICD-10-CM | POA: Diagnosis not present

## 2019-09-25 DIAGNOSIS — Z955 Presence of coronary angioplasty implant and graft: Secondary | ICD-10-CM | POA: Diagnosis not present

## 2019-09-25 DIAGNOSIS — I214 Non-ST elevation (NSTEMI) myocardial infarction: Secondary | ICD-10-CM

## 2019-09-25 DIAGNOSIS — Z79899 Other long term (current) drug therapy: Secondary | ICD-10-CM | POA: Diagnosis not present

## 2019-09-25 DIAGNOSIS — I252 Old myocardial infarction: Secondary | ICD-10-CM | POA: Diagnosis present

## 2019-09-25 LAB — LIPID PANEL
Chol/HDL Ratio: 2.3 ratio (ref 0.0–4.4)
Cholesterol, Total: 139 mg/dL (ref 100–199)
HDL: 61 mg/dL (ref >39)
LDL Chol Calc (NIH): 54 mg/dL (ref 0–99)
Triglycerides: 138 mg/dL (ref 0–149)
VLDL Cholesterol Cal: 24 mg/dL (ref 5–40)

## 2019-09-25 LAB — BASIC METABOLIC PANEL
BUN/Creatinine Ratio: 10 — ABNORMAL LOW (ref 12–28)
BUN: 8 mg/dL (ref 8–27)
CO2: 21 mmol/L (ref 20–29)
Calcium: 10 mg/dL (ref 8.7–10.3)
Chloride: 102 mmol/L (ref 96–106)
Creatinine, Ser: 0.77 mg/dL (ref 0.57–1.00)
GFR calc Af Amer: 87 mL/min/{1.73_m2} (ref >59)
GFR calc non Af Amer: 76 mL/min/{1.73_m2} (ref >59)
Glucose: 89 mg/dL (ref 65–99)
Potassium: 4.8 mmol/L (ref 3.5–5.2)
Sodium: 140 mmol/L (ref 134–144)

## 2019-09-25 LAB — LDL CHOLESTEROL, DIRECT: LDL Direct: 51 mg/dL (ref 0–99)

## 2019-09-25 NOTE — Progress Notes (Signed)
Cardiac Individual Treatment Plan  Patient Details  Name: Cheryl Briggs MRN: [id number] Date of Birth: July 20, 1945 Referring Provider:     Cardiac Rehab from 09/25/2019 in [facility] Cardiac and Pulmonary Rehab  Referring Provider Kathlyn Sacramento MD      Initial Encounter Date:    Cardiac Rehab from 09/25/2019 in [facility] Cardiac and Pulmonary Rehab  Date 09/25/19      Visit Diagnosis: NSTEMI (non-ST elevated myocardial infarction) [facility])  Status post coronary artery stent placement  Patient's Home Medications on Admission:  Current Outpatient Medications:  .  aspirin EC 81 MG EC tablet, Take 1 tablet (81 mg total) by mouth daily. Swallow whole., Disp: , Rfl:  .  atorvastatin (LIPITOR) 80 MG tablet, Take 1 tablet (80 mg total) by mouth daily., Disp: 30 tablet, Rfl: 2 .  Bioflavonoid Products (ESTER C PO), Take 1,000 mg by mouth daily., Disp: , Rfl:  .  calcium citrate-vitamin D (CITRACAL+D) 315-200 MG-UNIT tablet, Take 1 tablet by mouth 2 (two) times daily., Disp: , Rfl:  .  Chlorpheniramine Maleate (CHLOR-TABLETS PO), Take 1 tablet by mouth as needed. , Disp: , Rfl:  .  Cholecalciferol (VITAMIN D-1000 MAX ST) 25 MCG (1000 UT) tablet, Take 3,000 Units by mouth daily., Disp: , Rfl:  .  desonide (DESOWEN) 0.05 % ointment, desonide 0.05 % topical ointment, Disp: , Rfl:  .  gabapentin (NEURONTIN) 300 MG capsule, Take 300 mg by mouth 3 (three) times daily., Disp: , Rfl:  .  losartan (COZAAR) 25 MG tablet, Take 0.5 tablets (12.5 mg total) by mouth daily., Disp: 30 tablet, Rfl: 1 .  metoprolol succinate (TOPROL-XL) 50 MG 24 hr tablet, Take 1.5 tablets (75 mg total) by mouth daily., Disp: 60 tablet, Rfl: 0 .  Multiple Vitamins-Minerals (MULTIVITAMIN WITH MINERALS) tablet, Take 1 tablet by mouth daily., Disp: , Rfl:  .  Omeprazole (PRILOSEC PO), Take 1 tablet by mouth daily. , Disp: , Rfl:  .  ticagrelor (BRILINTA) 90 MG TABS tablet, Take 1 tablet (90 mg total) by mouth 2 (two) times daily., Disp: 60  tablet, Rfl: 2 .  traMADol-acetaminophen (ULTRACET) 37.5-325 MG tablet, Take 1 tablet by mouth every 6 (six) hours as needed., Disp: , Rfl:   Past Medical History: Past Medical History:  Diagnosis Date  . Allergy   . Arthritis   . 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    Tobacco Use: Social History   Tobacco Use  Smoking Status Never Smoker  Smokeless Tobacco Never Used    Labs: Recent Review Flowsheet Data    Labs for ITP Cardiac and Pulmonary Rehab Latest Ref Rng & Units 08/15/2019 09/24/2019   Cholestrol 100 - 199 mg/dL 181 139   LDLCALC 0 - 99 mg/dL 82 54   LDLDIRECT 0 - 99 mg/dL - 51   HDL >39 mg/dL 66 61   Trlycerides 0 - 149 mg/dL 164(H) 138   Hemoglobin A1c 4.8 - 5.6 % 6.0(H) -       Exercise Target Goals: Exercise Program Goal: Individual exercise prescription set using results from initial 6 min walk test and THRR while considering  patient's activity barriers and safety.   Exercise Prescription Goal: Initial exercise prescription builds to 30-45 minutes a day of aerobic activity, 2-3 days per week.  Home exercise guidelines will be given to patient during program as part of exercise prescription that the participant will acknowledge.   Education: Aerobic Exercise & Resistance Training: - Gives group verbal and written instruction on the various components  of exercise. Focuses on aerobic and resistive training programs and the benefits of this training and how to safely progress through these programs..   Education: Exercise & Equipment Safety: - Individual verbal instruction and demonstration of equipment use and safety with use of the equipment.   Cardiac Rehab from 09/25/2019 in [facility] Cardiac and Pulmonary Rehab  Date 09/19/19  Educator [facility]  Instruction Review Code 1- Verbalizes Understanding      Education: Exercise Physiology & General Exercise Guidelines: - Group verbal and written instruction with models to review the exercise physiology of the cardiovascular system  and associated critical values. Provides general exercise guidelines with specific guidelines to those with heart or lung disease.    Cardiac Rehab from 09/25/2019 in [facility] Cardiac and Pulmonary Rehab  Date 09/25/19  Instruction Review Code 3- Needs Reinforcement  [need identified]      Education: Flexibility, Balance, Mind/Body Relaxation: Provides group verbal/written instruction on the benefits of flexibility and balance training, including mind/body exercise modes such as yoga, pilates and tai chi.  Demonstration and skill practice provided.   Activity Barriers & Risk Stratification:  Activity Barriers & Cardiac Risk Stratification - 09/25/19 1159      Activity Barriers & Cardiac Risk Stratification   Activity Barriers Arthritis;Back Problems;Joint Problems;Deconditioning;Muscular Weakness;Shortness of Breath;Neck/Spine Problems;Balance Concerns;Decreased Ventricular Function;Other (comment)    Comments Bilateral shoulder surgery x2, back surgery x2, occasional hip/knee pain, arthritis all over    Cardiac Risk Stratification High           6 Minute Walk:  6 Minute Walk    Row Name 09/25/19 1159         6 Minute Walk   Phase Initial     Distance 1095 feet     Walk Time 6 minutes     # of Rest Breaks 0     MPH 2.07     METS 2.47     RPE 11     Perceived Dyspnea  1     VO2 Peak 8.64     Symptoms Yes (comment)     Comments slightly SOB at end     Resting HR 96 bpm     Resting BP 124/62     Resting Oxygen Saturation  95 %     Exercise Oxygen Saturation  during 6 min walk 96 %     Max Ex. HR 112 bpm     Max Ex. BP 154/74     2 Minute Post BP 126/64            Oxygen Initial Assessment:   Oxygen Re-Evaluation:   Oxygen Discharge (Final Oxygen Re-Evaluation):   Initial Exercise Prescription:  Initial Exercise Prescription - 09/25/19 1200      Date of Initial Exercise RX and Referring Provider   Date 09/25/19    Referring Provider Kathlyn Sacramento MD       Treadmill   MPH 1.9    Grade 0.5    Minutes 15    METs 2.59      Recumbant Bike   Level 1    RPM 50    Watts 10    Minutes 15    METs 2.5      NuStep   Level 1    SPM 80    Minutes 15    METs 2.5      Recumbant Elliptical   Level 1    RPM 50    Minutes 15    METs 2  Prescription Details   Frequency (times per week) 2    Duration Progress to 30 minutes of continuous aerobic without signs/symptoms of physical distress      Intensity   THRR 40-80% of Max Heartrate 110-134    Ratings of Perceived Exertion 11-13    Perceived Dyspnea 0-4      Progression   Progression Continue to progress workloads to maintain intensity without signs/symptoms of physical distress.      Resistance Training   Training Prescription Yes    Weight 3 lb           Perform Capillary Blood Glucose checks as needed.  Exercise Prescription Changes:  Exercise Prescription Changes    Row Name 09/25/19 1200             Response to Exercise   Blood Pressure (Admit) 124/62       Blood Pressure (Exercise) 154/74       Blood Pressure (Exit) 126/64       Heart Rate (Admit) 96 bpm       Heart Rate (Exercise) 112 bpm       Heart Rate (Exit) 87 bpm       Oxygen Saturation (Admit) 95 %       Oxygen Saturation (Exercise) 96 %       Rating of Perceived Exertion (Exercise) 11       Perceived Dyspnea (Exercise) 1       Symptoms SOB at end       Comments walk test results              Exercise Comments:   Exercise Goals and Review:  Exercise Goals    Row Name 09/25/19 1204             Exercise Goals   Increase Physical Activity Yes       Intervention Provide advice, education, support and counseling about physical activity/exercise needs.;Develop an individualized exercise prescription for aerobic and resistive training based on initial evaluation findings, risk stratification, comorbidities and participant's personal goals.       Expected Outcomes Short Term: Attend rehab on  a regular basis to increase amount of physical activity.;Long Term: Add in home exercise to make exercise part of routine and to increase amount of physical activity.;Long Term: Exercising regularly at least 3-5 days a week.       Increase Strength and Stamina Yes       Intervention Provide advice, education, support and counseling about physical activity/exercise needs.;Develop an individualized exercise prescription for aerobic and resistive training based on initial evaluation findings, risk stratification, comorbidities and participant's personal goals.       Expected Outcomes Short Term: Increase workloads from initial exercise prescription for resistance, speed, and METs.;Short Term: Perform resistance training exercises routinely during rehab and add in resistance training at home;Long Term: Improve cardiorespiratory fitness, muscular endurance and strength as measured by increased METs and functional capacity (6MWT)       Able to understand and use rate of perceived exertion (RPE) scale Yes       Intervention Provide education and explanation on how to use RPE scale       Expected Outcomes Short Term: Able to use RPE daily in rehab to express subjective intensity level;Long Term:  Able to use RPE to guide intensity level when exercising independently       Able to understand and use Dyspnea scale Yes       Intervention Provide education and explanation on how  to use Dyspnea scale       Expected Outcomes Short Term: Able to use Dyspnea scale daily in rehab to express subjective sense of shortness of breath during exertion;Long Term: Able to use Dyspnea scale to guide intensity level when exercising independently       Knowledge and understanding of Target Heart Rate Range (THRR) Yes       Intervention Provide education and explanation of THRR including how the numbers were predicted and where they are located for reference       Expected Outcomes Short Term: Able to state/look up THRR;Short Term:  Able to use daily as guideline for intensity in rehab;Long Term: Able to use THRR to govern intensity when exercising independently       Able to check pulse independently Yes       Intervention Provide education and demonstration on how to check pulse in carotid and radial arteries.;Review the importance of being able to check your own pulse for safety during independent exercise       Expected Outcomes Short Term: Able to explain why pulse checking is important during independent exercise;Long Term: Able to check pulse independently and accurately       Understanding of Exercise Prescription Yes       Intervention Provide education, explanation, and written materials on patient's individual exercise prescription       Expected Outcomes Short Term: Able to explain program exercise prescription;Long Term: Able to explain home exercise prescription to exercise independently              Exercise Goals Re-Evaluation :   Discharge Exercise Prescription (Final Exercise Prescription Changes):  Exercise Prescription Changes - 09/25/19 1200      Response to Exercise   Blood Pressure (Admit) 124/62    Blood Pressure (Exercise) 154/74    Blood Pressure (Exit) 126/64    Heart Rate (Admit) 96 bpm    Heart Rate (Exercise) 112 bpm    Heart Rate (Exit) 87 bpm    Oxygen Saturation (Admit) 95 %    Oxygen Saturation (Exercise) 96 %    Rating of Perceived Exertion (Exercise) 11    Perceived Dyspnea (Exercise) 1    Symptoms SOB at end    Comments walk test results           Nutrition:  Target Goals: Understanding of nutrition guidelines, daily intake of sodium <1578m, cholesterol <2086m calories 30% from fat and 7% or less from saturated fats, daily to have 5 or more servings of fruits and vegetables.  Education: Controlling Sodium/Reading Food Labels -Group verbal and written material supporting the discussion of sodium use in heart healthy nutrition. Review and explanation with models,  verbal and written materials for utilization of the food label.   Education: General Nutrition Guidelines/Fats and Fiber: -Group instruction provided by verbal, written material, models and posters to present the general guidelines for heart healthy nutrition. Gives an explanation and review of dietary fats and fiber.   [facility]  [facility] 09/25[facility]  Instruction Review Code 3- Needs Reinforcement  [need identified]      Biometrics:  Pre Biometrics - 09/25/19 1205      Pre Biometrics   Height 5' 1.9" (1.572 m)    Weight 150 lb 6.4 oz (68.2 kg)    BMI (Calculated) 27.61    Single Leg Stand 3.5 seconds            Nutrition Therapy Plan and Nutrition  Goals:   Nutrition Assessments:  Nutrition Assessments - 09/25/19 1206      MEDFICTS Scores   Pre Score 32           MEDIFICTS Score Key:          ?70 Need to make dietary changes          40-70 Heart Healthy Diet         ? 40 Therapeutic Level Cholesterol Diet  Nutrition Goals Re-Evaluation:   Nutrition Goals Discharge (Final Nutrition Goals Re-Evaluation):   Psychosocial: Target Goals: Acknowledge presence or absence of significant depression and/or stress, maximize coping skills, provide positive support system. Participant is able to verbalize types and ability to use techniques and skills needed for reducing stress and depression.   Education: Depression - Provides group verbal and written instruction on the correlation between heart/lung disease and depressed mood, treatment options, and the stigmas associated with seeking treatment.   Education: Sleep Hygiene -Provides group verbal and written instruction about how sleep can affect your health.  Define sleep hygiene, discuss sleep cycles and impact of sleep habits. Review good sleep hygiene tips.     Education: Stress and Anxiety: - Provides group verbal and written instruction about the health risks of  elevated stress and causes of high stress.  Discuss the correlation between heart/lung disease and anxiety and treatment options. Review healthy ways to manage with stress and anxiety.    Initial Review & Psychosocial Screening:  Initial Psych Review & Screening - 09/19/19 1038      Initial Review   Current issues with None Identified      Family Dynamics   Good Support System? Yes    Comments She can look to her daughter for support with whome she lives with.She has a positive outlook on her health and overall mental state.      Barriers   Psychosocial barriers to participate in program The patient should benefit from training in stress management and relaxation.;There are no identifiable barriers or psychosocial needs.      Screening Interventions   Interventions Encouraged to exercise;To provide support and resources with identified psychosocial needs;Provide feedback about the scores to participant    Expected Outcomes Short Term goal: Utilizing psychosocial counselor, staff and physician to assist with identification of specific Stressors or current issues interfering with healing process. Setting desired goal for each stressor or current issue identified.;Long Term Goal: Stressors or current issues are controlled or eliminated.;Short Term goal: Identification and review with participant of any Quality of Life or Depression concerns found by scoring the questionnaire.;Long Term goal: The participant improves quality of Life and PHQ9 Scores as seen by post scores and/or verbalization of changes           Quality of Life Scores:   Quality of Life - 09/25/19 1205      Quality of Life   Select Quality of Life      Quality of Life Scores   Health/Function Pre 27.67 %    Socioeconomic Pre 30 %    Psych/Spiritual Pre 30 %    Family Pre 27 %    GLOBAL Pre 28.53 %          Scores of 19 and below usually indicate a poorer quality of life in these areas.  A difference of  2-3  points is a clinically meaningful difference.  A difference of 2-3 points in the total score of the Quality of Life Index has been associated with significant  improvement in overall quality of life, self-image, physical symptoms, and general health in studies assessing change in quality of life.  PHQ-9: Recent Review Flowsheet Data    Depression screen [facility] 2/9 09/25/2019   Decreased Interest 0   Down, Depressed, Hopeless 0   PHQ - 2 Score 0   Altered sleeping 0   Tired, decreased energy 1   Change in appetite 1   Feeling bad or failure about yourself  0   Trouble concentrating 0   Moving slowly or fidgety/restless 0   Suicidal thoughts 0   PHQ-9 Score 2   Difficult doing work/chores Not difficult at all     Interpretation of Total Score  Total Score Depression Severity:  1-4 = Minimal depression, 5-9 = Mild depression, 10-14 = Moderate depression, 15-19 = Moderately severe depression, 20-27 = Severe depression   Psychosocial Evaluation and Intervention:  Psychosocial Evaluation - 09/19/19 1040      Psychosocial Evaluation & Interventions   Interventions Encouraged to exercise with the program and follow exercise prescription    Comments She can look to her daughter for support with whome she lives with.She has a positive outlook on her health and overall mental state.    Expected Outcomes Short: Exercise regularly to support mental health and notify staff of any changes. Long: maintain mental health and well being through teaching of rehab or prescribed medications independently.    Continue Psychosocial Services  Follow up required by counselor           Psychosocial Re-Evaluation:   Psychosocial Discharge (Final Psychosocial Re-Evaluation):   Vocational Rehabilitation: Provide vocational rehab assistance to qualifying candidates.   Vocational Rehab Evaluation & Intervention:   Education: Education Goals: Education classes will be provided on a variety of topics  geared toward better understanding of heart health and risk factor modification. Participant will state understanding/return demonstration of topics presented as noted by education test scores.  Learning Barriers/Preferences:  Learning Barriers/Preferences - 09/19/19 1037      Learning Barriers/Preferences   Learning Barriers None    Learning Preferences None           General Cardiac Education Topics:  AED/CPR: - Group verbal and written instruction with the use of models to demonstrate the basic use of the AED with the basic ABC's of resuscitation.   Anatomy & Physiology of the Heart: - Group verbal and written instruction and models provide basic cardiac anatomy and physiology, with the coronary electrical and arterial systems. Review of Valvular disease and Heart Failure   Cardiac Rehab from 09/25/2019 in [facility] Cardiac and Pulmonary Rehab  Date 09/25/19  Instruction Review Code 3- Needs Reinforcement  [need identified]      Cardiac Procedures: - Group verbal and written instruction to review commonly prescribed medications for heart disease. Reviews the medication, class of the drug, and side effects. Includes the steps to properly store meds and maintain the prescription regimen. (beta blockers and nitrates)   Cardiac Medications I: - Group verbal and written instruction to review commonly prescribed medications for heart disease. Reviews the medication, class of the drug, and side effects. Includes the steps to properly store meds and maintain the prescription regimen.   Cardiac Medications II: -Group verbal and written instruction to review commonly prescribed medications for heart disease. Reviews the medication, class of the drug, and side effects. (all other drug classes)    Go Sex-Intimacy & Heart Disease, Get SMART - Goal Setting: - Group verbal and written instruction through game  format to discuss heart disease and the return to sexual intimacy. Provides group  verbal and written material to discuss and apply goal setting through the application of the S.M.A.R.T. Method.   Other Matters of the Heart: - Provides group verbal, written materials and models to describe Stable Angina and Peripheral Artery. Includes description of the disease process and treatment options available to the cardiac patient.   Infection Prevention: - Provides verbal and written material to individual with discussion of infection control including proper hand washing and proper equipment cleaning during exercise session.   Cardiac Rehab from 09/25/2019 in [facility] Cardiac and Pulmonary Rehab  Date 09/19/19  Educator [facility]  Instruction Review Code 1- Verbalizes Understanding      Falls Prevention: - Provides verbal and written material to individual with discussion of falls prevention and safety.   Cardiac Rehab from 09/25/2019 in [facility] Cardiac and Pulmonary Rehab  Date 09/19/19  Educator [facility]  Instruction Review Code 1- Verbalizes Understanding      Other: -Provides group and verbal instruction on various topics (see comments)   Knowledge Questionnaire Score:  Knowledge Questionnaire Score - 09/25/19 1206      Knowledge Questionnaire Score   Pre Score 23/26 Education Focus: Exercise, nutrition, angina           Core Components/Risk Factors/Patient Goals at Admission:  Personal Goals and Risk Factors at Admission - 09/25/19 1206      Core Components/Risk Factors/Patient Goals on Admission    Weight Management Yes;Weight Loss    Intervention Weight Management: Develop a combined nutrition and exercise program designed to reach desired caloric intake, while maintaining appropriate intake of nutrient and fiber, sodium and fats, and appropriate energy expenditure required for the weight goal.;Weight Management: Provide education and appropriate resources to help participant work on and attain dietary goals.    Admit Weight 150 lb 6.4 oz (68.2 kg)    Goal Weight: Short  Term 145 lb (65.8 kg)    Goal Weight: Long Term 140 lb (63.5 kg)    Expected Outcomes Short Term: Continue to assess and modify interventions until short term weight is achieved;Long Term: Adherence to nutrition and physical activity/exercise program aimed toward attainment of established weight goal;Understanding recommendations for meals to include 15-35% energy as protein, 25-35% energy from fat, 35-60% energy from carbohydrates, less than 256m of dietary cholesterol, 20-35 gm of total fiber daily;Understanding of distribution of calorie intake throughout the day with the consumption of 4-5 meals/snacks;Weight Loss: Understanding of general recommendations for a balanced deficit meal plan, which promotes 1-2 lb weight loss per week and includes a negative energy balance of [id number] kcal/d    Heart Failure Yes    Intervention Provide a combined exercise and nutrition program that is supplemented with education, support and counseling about heart failure. Directed toward relieving symptoms such as shortness of breath, decreased exercise tolerance, and extremity edema.    Expected Outcomes Improve functional capacity of life;Short term: Attendance in program 2-3 days a week with increased exercise capacity. Reported lower sodium intake. Reported increased fruit and vegetable intake. Reports medication compliance.;Short term: Daily weights obtained and reported for increase. Utilizing diuretic protocols set by physician.;Long term: Adoption of self-care skills and reduction of barriers for early signs and symptoms recognition and intervention leading to self-care maintenance.    Hypertension Yes    Intervention Provide education on lifestyle modifcations including regular physical activity/exercise, weight management, moderate sodium restriction and increased consumption of fresh fruit, vegetables, and low fat dairy, alcohol moderation,  and smoking cessation.;Monitor prescription use compliance.    Expected  Outcomes Long Term: Maintenance of blood pressure at goal levels.;Short Term: Continued assessment and intervention until BP is < 140/75m HG in hypertensive participants. < 130/839mHG in hypertensive participants with diabetes, heart failure or chronic kidney disease.    Lipids Yes    Intervention Provide education and support for participant on nutrition & aerobic/resistive exercise along with prescribed medications to achieve LDL <7020mHDL >42m19m  Expected Outcomes Short Term: Participant states understanding of desired cholesterol values and is compliant with medications prescribed. Participant is following exercise prescription and nutrition guidelines.;Long Term: Cholesterol controlled with medications as prescribed, with individualized exercise RX and with personalized nutrition plan. Value goals: LDL < 70mg68mL > 40 mg.           Education:Diabetes - Individual verbal and written instruction to review signs/symptoms of diabetes, desired ranges of glucose level fasting, after meals and with exercise. Acknowledge that pre and post exercise glucose checks will be done for 3 sessions at entry of program.   Education: Know Your Numbers and Risk Factors: -Group verbal and written instruction about important numbers in your health.  Discussion of what are risk factors and how they play a role in the disease process.  Review of Cholesterol, Blood Pressure, Diabetes, and BMI and the role they play in your overall health.   Core Components/Risk Factors/Patient Goals Review:    Core Components/Risk Factors/Patient Goals at Discharge (Final Review):    ITP Comments:  ITP Comments    Row Name 09/19/19 1036 09/25/19 1159         ITP Comments Virtual Visit completed. Patient informed on EP and RD appointment and 6 Minute walk test. Patient also informed of patient health questionnaires on My Chart. Patient Verbalizes understanding. Visit diagnosis can be found in [facility]. Completed  6MWT and gym orientation. Initial ITP created and sent for review to Dr. Mark Emily Filbertical Director.             Comments: Initial ITP

## 2019-09-25 NOTE — Patient Instructions (Signed)
Patient Instructions  Patient Details  Name: Cheryl Briggs MRN: [id number] Date of Birth: Dec 14, 1945 Referring Provider:  Wellington Hampshire, MD  Below are your personal goals for exercise, nutrition, and risk factors. Our goal is to help you stay on track towards obtaining and maintaining these goals. We will be discussing your progress on these goals with you throughout the program.  Initial Exercise Prescription:  Initial Exercise Prescription - 09/25/19 1200      Date of Initial Exercise RX and Referring Provider   Date 09/25/19    Referring Provider Kathlyn Sacramento MD      Treadmill   MPH 1.9    Grade 0.5    Minutes 15    METs 2.59      Recumbant Bike   Level 1    RPM 50    Watts 10    Minutes 15    METs 2.5      NuStep   Level 1    SPM 80    Minutes 15    METs 2.5      Recumbant Elliptical   Level 1    RPM 50    Minutes 15    METs 2      Prescription Details   Frequency (times per week) 2    Duration Progress to 30 minutes of continuous aerobic without signs/symptoms of physical distress      Intensity   THRR 40-80% of Max Heartrate 110-134    Ratings of Perceived Exertion 11-13    Perceived Dyspnea 0-4      Progression   Progression Continue to progress workloads to maintain intensity without signs/symptoms of physical distress.      Resistance Training   Training Prescription Yes    Weight 3 lb           Exercise Goals: Frequency: Be able to perform aerobic exercise two to three times per week in program working toward 2-5 days per week of home exercise.  Intensity: Work with a perceived exertion of 11 (fairly light) - 15 (hard) while following your exercise prescription.  We will make changes to your prescription with you as you progress through the program.   Duration: Be able to do 30 to 45 minutes of continuous aerobic exercise in addition to a 5 minute warm-up and a 5 minute cool-down routine.   Nutrition Goals: Your personal  nutrition goals will be established when you do your nutrition analysis with the dietician.  The following are general nutrition guidelines to follow: Cholesterol < 200mg /day Sodium < 1500mg /day Fiber: Women over 50 yrs - 21 grams per day  Personal Goals:  Personal Goals and Risk Factors at Admission - 09/25/19 1206      Core Components/Risk Factors/Patient Goals on Admission    Weight Management Yes;Weight Loss    Intervention Weight Management: Develop a combined nutrition and exercise program designed to reach desired caloric intake, while maintaining appropriate intake of nutrient and fiber, sodium and fats, and appropriate energy expenditure required for the weight goal.;Weight Management: Provide education and appropriate resources to help participant work on and attain dietary goals.    Admit Weight 150 lb 6.4 oz (68.2 kg)    Goal Weight: Short Term 145 lb (65.8 kg)    Goal Weight: Long Term 140 lb (63.5 kg)    Expected Outcomes Short Term: Continue to assess and modify interventions until short term weight is achieved;Long Term: Adherence to nutrition and physical activity/exercise program aimed toward attainment of established weight  goal;Understanding recommendations for meals to include 15-35% energy as protein, 25-35% energy from fat, 35-60% energy from carbohydrates, less than 200mg  of dietary cholesterol, 20-35 gm of total fiber daily;Understanding of distribution of calorie intake throughout the day with the consumption of 4-5 meals/snacks;Weight Loss: Understanding of general recommendations for a balanced deficit meal plan, which promotes 1-2 lb weight loss per week and includes a negative energy balance of [phone] kcal/d    Heart Failure Yes    Intervention Provide a combined exercise and nutrition program that is supplemented with education, support and counseling about heart failure. Directed toward relieving symptoms such as shortness of breath, decreased exercise tolerance,  and extremity edema.    Expected Outcomes Improve functional capacity of life;Short term: Attendance in program 2-3 days a week with increased exercise capacity. Reported lower sodium intake. Reported increased fruit and vegetable intake. Reports medication compliance.;Short term: Daily weights obtained and reported for increase. Utilizing diuretic protocols set by physician.;Long term: Adoption of self-care skills and reduction of barriers for early signs and symptoms recognition and intervention leading to self-care maintenance.    Hypertension Yes    Intervention Provide education on lifestyle modifcations including regular physical activity/exercise, weight management, moderate sodium restriction and increased consumption of fresh fruit, vegetables, and low fat dairy, alcohol moderation, and smoking cessation.;Monitor prescription use compliance.    Expected Outcomes Long Term: Maintenance of blood pressure at goal levels.;Short Term: Continued assessment and intervention until BP is < 140/41mm HG in hypertensive participants. < 130/18mm HG in hypertensive participants with diabetes, heart failure or chronic kidney disease.    Lipids Yes    Intervention Provide education and support for participant on nutrition & aerobic/resistive exercise along with prescribed medications to achieve LDL 70mg , HDL >40mg .    Expected Outcomes Short Term: Participant states understanding of desired cholesterol values and is compliant with medications prescribed. Participant is following exercise prescription and nutrition guidelines.;Long Term: Cholesterol controlled with medications as prescribed, with individualized exercise RX and with personalized nutrition plan. Value goals: LDL < 70mg , HDL > 40 mg.           Tobacco Use Initial Evaluation: Social History   Tobacco Use  Smoking Status Never Smoker  Smokeless Tobacco Never Used    Exercise Goals and Review:  Exercise Goals    Row Name 09/25/19 1204              Exercise Goals   Increase Physical Activity Yes       Intervention Provide advice, education, support and counseling about physical activity/exercise needs.;Develop an individualized exercise prescription for aerobic and resistive training based on initial evaluation findings, risk stratification, comorbidities and participant's personal goals.       Expected Outcomes Short Term: Attend rehab on a regular basis to increase amount of physical activity.;Long Term: Add in home exercise to make exercise part of routine and to increase amount of physical activity.;Long Term: Exercising regularly at least 3-5 days a week.       Increase Strength and Stamina Yes       Intervention Provide advice, education, support and counseling about physical activity/exercise needs.;Develop an individualized exercise prescription for aerobic and resistive training based on initial evaluation findings, risk stratification, comorbidities and participant's personal goals.       Expected Outcomes Short Term: Increase workloads from initial exercise prescription for resistance, speed, and METs.;Short Term: Perform resistance training exercises routinely during rehab and add in resistance training at home;Long Term: Improve cardiorespiratory fitness, muscular endurance and  strength as measured by increased METs and functional capacity (6MWT)       Able to understand and use rate of perceived exertion (RPE) scale Yes       Intervention Provide education and explanation on how to use RPE scale       Expected Outcomes Short Term: Able to use RPE daily in rehab to express subjective intensity level;Long Term:  Able to use RPE to guide intensity level when exercising independently       Able to understand and use Dyspnea scale Yes       Intervention Provide education and explanation on how to use Dyspnea scale       Expected Outcomes Short Term: Able to use Dyspnea scale daily in rehab to express subjective sense of  shortness of breath during exertion;Long Term: Able to use Dyspnea scale to guide intensity level when exercising independently       Knowledge and understanding of Target Heart Rate Range (THRR) Yes       Intervention Provide education and explanation of THRR including how the numbers were predicted and where they are located for reference       Expected Outcomes Short Term: Able to state/look up THRR;Short Term: Able to use daily as guideline for intensity in rehab;Long Term: Able to use THRR to govern intensity when exercising independently       Able to check pulse independently Yes       Intervention Provide education and demonstration on how to check pulse in carotid and radial arteries.;Review the importance of being able to check your own pulse for safety during independent exercise       Expected Outcomes Short Term: Able to explain why pulse checking is important during independent exercise;Long Term: Able to check pulse independently and accurately       Understanding of Exercise Prescription Yes       Intervention Provide education, explanation, and written materials on patient's individual exercise prescription       Expected Outcomes Short Term: Able to explain program exercise prescription;Long Term: Able to explain home exercise prescription to exercise independently              Copy of goals given to participant.

## 2019-09-27 ENCOUNTER — Telehealth: Payer: Self-pay | Admitting: Physician Assistant

## 2019-09-27 NOTE — Telephone Encounter (Signed)
Arvil Chaco, PA-C  09/27/2019 12:09 AM EDT     Doristine Devoid labs! Very reassuring!   Renal function stable after starting her losartan, which is good news.  Continue to monitor BP at home with goal BP 130/80 or lower.  LDL 51 and at goal of below 70. Triglycerides improved from her previous labs and no longer elevated.  Please also let her know that I looked into additional options for pain / joint relief, and the recommendation was still for use of Tylenol and avoidance of NSAIDs. Hopefully, Ice / heat and biofreeze as discussed in clinic will offer some additional relief! We can revisit this at her next visit.

## 2019-09-27 NOTE — Telephone Encounter (Signed)
Patient returning call for results 

## 2019-09-27 NOTE — Telephone Encounter (Signed)
Attempted to call the patient. No answer- I left a message to please call back.  

## 2019-09-30 NOTE — Telephone Encounter (Signed)
Patient is returning the call, request to be called back after lunch

## 2019-09-30 NOTE — Telephone Encounter (Signed)
Attempted to call the patient on her home #. No answer- I left a message to please call back.  Tried the patient at her cell #- I was able to reach her.  The patient has been notified of the result and verbalized understanding. All questions (if any) were answered. Alvis Lemmings, RN 09/30/2019 10:57 AM

## 2019-10-02 ENCOUNTER — Encounter: Payer: Medicare Other | Admitting: *Deleted

## 2019-10-02 ENCOUNTER — Other Ambulatory Visit: Payer: Self-pay

## 2019-10-02 DIAGNOSIS — I214 Non-ST elevation (NSTEMI) myocardial infarction: Secondary | ICD-10-CM

## 2019-10-02 DIAGNOSIS — I252 Old myocardial infarction: Secondary | ICD-10-CM | POA: Diagnosis not present

## 2019-10-02 DIAGNOSIS — Z955 Presence of coronary angioplasty implant and graft: Secondary | ICD-10-CM

## 2019-10-02 NOTE — Progress Notes (Signed)
Daily Session Note  Patient Details  Name: Cheryl Briggs MRN: [id number] Date of Birth: 1945-01-14 Referring Provider:     Cardiac Rehab from 09/25/2019 in [facility] Card[facility] Rehab  Referring Provider Kathlyn Sacramento MD      Encounter Date: 10/02/2019  Check In:      Social History   Tobacco Use  Smoking Status Never Smoker  Smokeless Tobacco Never Used    Goals Met:  Independence with exercise equipment Exercise tolerated well No report of cardiac concerns or symptoms Strength training completed today  Goals Unmet:  Not Applicable  Comments: First full day of exercise!  Patient was oriented to gym and equipment including functions, settings, policies, and procedures.  Patient's individual exercise prescription and treatment plan were reviewed.  All starting workloads were established based on the results of the 6 minute walk test done at initial orientation visit.  The plan for exercise progression was also introduced and progression will be customized based on patient's performance and goals.    Dr. Emily Filbert is Medical Director for [place] and [name].

## 2019-10-08 ENCOUNTER — Other Ambulatory Visit: Payer: Self-pay

## 2019-10-08 DIAGNOSIS — I214 Non-ST elevation (NSTEMI) myocardial infarction: Secondary | ICD-10-CM

## 2019-10-08 NOTE — Progress Notes (Signed)
Completed RD Evaluation

## 2019-10-09 ENCOUNTER — Encounter: Payer: Medicare Other | Admitting: *Deleted

## 2019-10-09 ENCOUNTER — Other Ambulatory Visit: Payer: Self-pay

## 2019-10-09 DIAGNOSIS — Z955 Presence of coronary angioplasty implant and graft: Secondary | ICD-10-CM

## 2019-10-09 DIAGNOSIS — I214 Non-ST elevation (NSTEMI) myocardial infarction: Secondary | ICD-10-CM

## 2019-10-09 DIAGNOSIS — I252 Old myocardial infarction: Secondary | ICD-10-CM | POA: Diagnosis not present

## 2019-10-09 NOTE — Progress Notes (Signed)
Daily Session Note  Patient Details  Name: Cheryl Briggs MRN: [id number] Date of Birth: 1945-08-30 Referring Provider:     Cardiac Rehab from 09/25/2019 in [facility] Cardiac and Pulmonary Rehab  Referring Provider Kathlyn Sacramento MD      Encounter Date: 10/09/2019  Check In:  Session Check In - 10/09/19 1250      Check-In   Supervising physician immediately available to respond to emergencies See telemetry face sheet for immediately available ER MD    Location [facility]-Cardiac & Pulmonary Rehab    Staff Present Heath Lark, RN, BSN, CCRP;Amanda Sommer, BA, ACSM CEP, Exercise Physiologist;Kara Eliezer Bottom, MS Exercise Physiologist    Virtual Visit No    Medication changes reported     No    Fall or balance concerns reported    No    Warm-up and Cool-down Performed on first and last piece of equipment    Resistance Training Performed Yes    VAD Patient? No    PAD/SET Patient? No      Pain Assessment   Currently in Pain? No/denies              Social History   Tobacco Use  Smoking Status Never Smoker  Smokeless Tobacco Never Used    Goals Met:  Independence with exercise equipment Exercise tolerated well No report of cardiac concerns or symptoms  Goals Unmet:  Not Applicable  Comments: Pt able to follow exercise prescription today without complaint.  Will continue to monitor for progression.    Dr. Emily Filbert is Medical Director for [place] and [name].

## 2019-10-09 NOTE — Progress Notes (Signed)
Daily Session Note  Patient Details  Name: Cheryl Briggs MRN: [id number] Date of Birth: February 20, 1945 Referring Provider:     Cardiac Rehab from 09/25/2019 in [facility] Cardiac and Pulmonary Rehab  Referring Provider Kathlyn Sacramento MD      Encounter Date: 10/09/2019  Check In:  Session Check In - 10/09/19 1249      Check-In   Supervising physician immediately available to respond to emergencies See telemetry face sheet for immediately available ER MD    Location [facility]-Cardiac & Pulmonary Rehab    Staff Present Heath Lark, RN, BSN, CCRP;Amanda Sommer, BA, ACSM CEP, Exercise Physiologist;Kara Eliezer Bottom, MS Exercise Physiologist    Virtual Visit No    Medication changes reported     No    Fall or balance concerns reported    No    Warm-up and Cool-down Performed on first and last piece of equipment    Resistance Training Performed Yes    VAD Patient? No    PAD/SET Patient? No      Pain Assessment   Currently in Pain? No/denies              Social History   Tobacco Use  Smoking Status Never Smoker  Smokeless Tobacco Never Used    Goals Met:  Independence with exercise equipment Exercise tolerated well No report of cardiac concerns or symptoms  Goals Unmet:  Not Applicable  Comments: Pt able to follow exercise prescription today without complaint.  Will continue to monitor for progression.    Dr. Emily Filbert is Medical Director for [place] and [name].

## 2019-10-11 ENCOUNTER — Encounter: Payer: Medicare Other | Attending: Cardiovascular Disease | Admitting: *Deleted

## 2019-10-11 ENCOUNTER — Other Ambulatory Visit: Payer: Self-pay

## 2019-10-11 DIAGNOSIS — Z955 Presence of coronary angioplasty implant and graft: Secondary | ICD-10-CM | POA: Diagnosis present

## 2019-10-11 DIAGNOSIS — I214 Non-ST elevation (NSTEMI) myocardial infarction: Secondary | ICD-10-CM | POA: Diagnosis not present

## 2019-10-11 NOTE — Progress Notes (Signed)
Daily Session Note  Patient Details  Name: Tammela Bales MRN: [id number] Date of Birth: 1945/09/25 Referring Provider:     Cardiac Rehab from 09/25/2019 in [facility] Cardiac and Pulmonary Rehab  Referring Provider Kathlyn Sacramento MD      Encounter Date: 10/11/2019  Check In:  Session Check In - 10/11/19 0939      Check-In   Supervising physician immediately available to respond to emergencies See telemetry face sheet for immediately available ER MD    Location [facility]-Cardiac & Pulmonary Rehab    Staff Present Renita Papa, RN BSN;Joseph Lou Miner, [place] Exercise Physiologist    Virtual Visit No    Medication changes reported     No    Fall or balance concerns reported    No    Warm-up and Cool-down Performed on first and last piece of equipment    Resistance Training Performed Yes    VAD Patient? No    PAD/SET Patient? No      Pain Assessment   Currently in Pain? No/denies              Social History   Tobacco Use  Smoking Status Never Smoker  Smokeless Tobacco Never Used    Goals Met:  Independence with exercise equipment Exercise tolerated well No report of cardiac concerns or symptoms Strength training completed today  Goals Unmet:  Not Applicable  Comments: Pt able to follow exercise prescription today without complaint.  Will continue to monitor for progression.    Dr. Emily Filbert is Medical Director for [place] and [name].

## 2019-10-16 ENCOUNTER — Other Ambulatory Visit: Payer: Self-pay

## 2019-10-16 ENCOUNTER — Encounter: Payer: Medicare Other | Admitting: *Deleted

## 2019-10-16 ENCOUNTER — Encounter: Payer: Self-pay | Admitting: *Deleted

## 2019-10-16 DIAGNOSIS — I214 Non-ST elevation (NSTEMI) myocardial infarction: Secondary | ICD-10-CM | POA: Diagnosis not present

## 2019-10-16 DIAGNOSIS — Z955 Presence of coronary angioplasty implant and graft: Secondary | ICD-10-CM

## 2019-10-16 NOTE — Progress Notes (Signed)
Daily Session Note  Patient Details  Name: Cheryl Briggs MRN: [id number] Date of Birth: 08/13/1945 Referring Provider:     Cardiac Rehab from 09/25/2019 in [facility] Cardiac and Pulmonary Rehab  Referring Provider Arida, Muhammad MD      Encounter Date: 10/16/2019  Check In:  Session Check In - 10/16/19 0953      Check-In   Supervising physician immediately available to respond to emergencies See telemetry face sheet for immediately available ER MD    Location [facility]-Cardiac & Pulmonary Rehab    Staff Present Meredith Craven, RN BSN;Joseph Hood RCP,RRT,BSRT;Jessica Hawkins, MA, RCEP, CCRP, CCET;Melissa Caiola RDN, LDN    Virtual Visit No    Medication changes reported     No    Fall or balance concerns reported    No    Warm-up and Cool-down Performed on first and last piece of equipment    Resistance Training Performed Yes    VAD Patient? No    PAD/SET Patient? No      Pain Assessment   Currently in Pain? No/denies              Social History   Tobacco Use  Smoking Status Never Smoker  Smokeless Tobacco Never Used    Goals Met:  Independence with exercise equipment Exercise tolerated well No report of cardiac concerns or symptoms Strength training completed today  Goals Unmet:  Not Applicable  Comments: Pt able to follow exercise prescription today without complaint.  Will continue to monitor for progression.    Dr. Mark Miller is Medical Director for [facility] and [name]. 

## 2019-10-16 NOTE — Progress Notes (Signed)
Cardiac Individual Treatment Plan  Patient Details  Name: Cheryl Briggs MRN: [id number] Date of Birth: [birth year] Referring Provider:     Cardiac Rehab from 09/25/2019 in [facility] Cardiac and Pulmonary Rehab  Referring Provider Kathlyn Sacramento MD      Initial Encounter Date:    Cardiac Rehab from 09/25/2019 in [facility] Cardiac and Pulmonary Rehab  Date 09/25/19      Visit Diagnosis: NSTEMI (non-ST elevated myocardial infarction) [facility])  Status post coronary artery stent placement  Patient's Home Medications on Admission:  Current Outpatient Medications:    aspirin EC 81 MG EC tablet, Take 1 tablet (81 mg total) by mouth daily. Swallow whole., Disp: , Rfl:    atorvastatin (LIPITOR) 80 MG tablet, Take 1 tablet (80 mg total) by mouth daily., Disp: 30 tablet, Rfl: 2   Bioflavonoid Products (ESTER C PO), Take 1,000 mg by mouth daily., Disp: , Rfl:    calcium citrate-vitamin D (CITRACAL+D) 315-200 MG-UNIT tablet, Take 1 tablet by mouth 2 (two) times daily., Disp: , Rfl:    Chlorpheniramine Maleate (CHLOR-TABLETS PO), Take 1 tablet by mouth as needed. , Disp: , Rfl:    Cholecalciferol (VITAMIN D-1000 MAX ST) 25 MCG (1000 UT) tablet, Take 3,000 Units by mouth daily., Disp: , Rfl:    desonide (DESOWEN) 0.05 % ointment, desonide 0.05 % topical ointment, Disp: , Rfl:    gabapentin (NEURONTIN) 300 MG capsule, Take 300 mg by mouth 3 (three) times daily., Disp: , Rfl:    losartan (COZAAR) 25 MG tablet, Take 0.5 tablets (12.5 mg total) by mouth daily., Disp: 30 tablet, Rfl: 1   metoprolol succinate (TOPROL-XL) 50 MG 24 hr tablet, Take 1.5 tablets (75 mg total) by mouth daily., Disp: 60 tablet, Rfl: 0   Multiple Vitamins-Minerals (MULTIVITAMIN WITH MINERALS) tablet, Take 1 tablet by mouth daily., Disp: , Rfl:    Omeprazole (PRILOSEC PO), Take 1 tablet by mouth daily. 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    Tobacco Use: Social History   Tobacco Use  Smoking Status Never Smoker  Smokeless Tobacco Never Used    Labs: Recent Review Flowsheet Data    Labs for ITP Cardiac and Pulmonary Rehab Latest Ref Rng & Units 08/15/2019 09/24/2019   Cholestrol 100 - 199 mg/dL 181 139   LDLCALC 0 - 99 mg/dL 82 54   LDLDIRECT 0 - 99 mg/dL - 51   HDL >39 mg/dL 66 61   Trlycerides 0 - 149 mg/dL 164(H) 138   Hemoglobin A1c 4.8 - 5.6 % 6.0(H) -       Exercise Target Goals: Exercise Program Goal: Individual exercise prescription set using results from initial 6 min walk test and THRR while considering  patients activity barriers and safety.   Exercise Prescription Goal: Initial exercise prescription builds to 30-45 minutes a day of aerobic activity, 2-3 days per week.  Home exercise guidelines will be given to patient during program as part of exercise prescription that the participant will acknowledge.   Education: Aerobic Exercise & Resistance Training: - Gives group verbal and written instruction on the various components  of exercise. Focuses on aerobic and resistive training programs and the benefits of this training and how to safely progress through these programs..   Education: Exercise & Equipment Safety: - Individual verbal instruction and demonstration of equipment use and safety with use of the equipment.   Cardiac Rehab from 10/09/2019 in [facility] Cardiac and Pulmonary Rehab  Date 09/19/19  Educator [facility]  Instruction Review Code 1- Verbalizes Understanding      Education: Exercise Physiology & General Exercise Guidelines: - Group verbal and written instruction with models to review the exercise physiology of the cardiovascular system  and associated critical values. Provides general exercise guidelines with specific guidelines to those with heart or lung disease.    Cardiac Rehab from 10/09/2019 in [facility] Cardiac and Pulmonary Rehab  Date 09/25/19  Instruction Review Code 3- Needs Reinforcement  [need identified]      Education: Flexibility, Balance, Mind/Body Relaxation: Provides group verbal/written instruction on the benefits of flexibility and balance training, including mind/body exercise modes such as yoga, pilates and tai chi.  Demonstration and skill practice provided.   Activity Barriers & Risk Stratification:  Activity Barriers & Cardiac Risk Stratification - 09/25/19 1159      Activity Barriers & Cardiac Risk Stratification   Activity Barriers Arthritis;Back Problems;Joint Problems;Deconditioning;Muscular Weakness;Shortness of Breath;Neck/Spine Problems;Balance Concerns;Decreased Ventricular Function;Other (comment)    Comments Bilateral shoulder surgery x2, back surgery x2, occasional hip/knee pain, arthritis all over    Cardiac Risk Stratification High           6 Minute Walk:  6 Minute Walk    Row Name 09/25/19 1159         6 Minute Walk   Phase Initial     Distance 1095 feet     Walk Time 6 minutes     # of Rest Breaks 0     MPH 2.07     METS 2.47     RPE 11     Perceived Dyspnea  1     VO2 Peak 8.64     Symptoms Yes (comment)     Comments slightly SOB at end     Resting HR 96 bpm     Resting BP 124/62     Resting Oxygen Saturation  95 %     Exercise Oxygen Saturation  during 6 min walk 96 %     Max Ex. HR 112 bpm     Max Ex. BP 154/74     2 Minute Post BP 126/64            Oxygen Initial Assessment:   Oxygen Re-Evaluation:   Oxygen Discharge (Final Oxygen Re-Evaluation):   Initial Exercise Prescription:  Initial Exercise Prescription - 09/25/19 1200      Date of Initial Exercise RX and Referring Provider   Date 09/25/19    Referring Provider Kathlyn Sacramento MD       Treadmill   MPH 1.9    Grade 0.5    Minutes 15    METs 2.59      Recumbant Bike   Level 1    RPM 50    Watts 10    Minutes 15    METs 2.5      NuStep   Level 1    SPM 80    Minutes 15    METs 2.5      Recumbant Elliptical   Level 1    RPM 50    Minutes 15    METs 2  Prescription Details   Frequency (times per week) 2    Duration Progress to 30 minutes of continuous aerobic without signs/symptoms of physical distress      Intensity   THRR 40-80% of Max Heartrate 110-134    Ratings of Perceived Exertion 11-13    Perceived Dyspnea 0-4      Progression   Progression Continue to progress workloads to maintain intensity without signs/symptoms of physical distress.      Resistance Training   Training Prescription Yes    Weight 3 lb           Perform Capillary Blood Glucose checks as needed.  Exercise Prescription Changes:  Exercise Prescription Changes    Row Name 09/25/19 1200 10/09/19 1700           Response to Exercise   Blood Pressure (Admit) 124/62 118/62      Blood Pressure (Exercise) 154/74 132/64      Blood Pressure (Exit) 126/64 102/60      Heart Rate (Admit) 96 bpm 94 bpm      Heart Rate (Exercise) 112 bpm 117 bpm      Heart Rate (Exit) 87 bpm 88 bpm      Oxygen Saturation (Admit) 95 % --      Oxygen Saturation (Exercise) 96 % --      Rating of Perceived Exertion (Exercise) 11 15      Perceived Dyspnea (Exercise) 1 --      Symptoms SOB at end --      Comments walk test results first full day of exercise      Duration -- Progress to 30 minutes of  aerobic without signs/symptoms of physical distress        Progression   Progression -- Continue to progress workloads to maintain intensity without signs/symptoms of physical distress.        Resistance Training   Training Prescription -- Yes      Weight -- 3 lb      Reps -- 10-15        Treadmill   MPH -- 1.9      Grade -- 0.5      Minutes -- 15      METs -- 2.59        Recumbant  Elliptical   Level -- 1      Minutes -- 15      METs -- 1.6             Exercise Comments:   Exercise Goals and Review:  Exercise Goals    Row Name 09/25/19 1204             Exercise Goals   Increase Physical Activity Yes       Intervention Provide advice, education, support and counseling about physical activity/exercise needs.;Develop an individualized exercise prescription for aerobic and resistive training based on initial evaluation findings, risk stratification, comorbidities and participant's personal goals.       Expected Outcomes Short Term: Attend rehab on a regular basis to increase amount of physical activity.;Long Term: Add in home exercise to make exercise part of routine and to increase amount of physical activity.;Long Term: Exercising regularly at least 3-5 days a week.       Increase Strength and Stamina Yes       Intervention Provide advice, education, support and counseling about physical activity/exercise needs.;Develop an individualized exercise prescription for aerobic and resistive training based on initial evaluation findings, risk stratification, comorbidities and participant's personal goals.  Expected Outcomes Short Term: Increase workloads from initial exercise prescription for resistance, speed, and METs.;Short Term: Perform resistance training exercises routinely during rehab and add in resistance training at home;Long Term: Improve cardiorespiratory fitness, muscular endurance and strength as measured by increased METs and functional capacity (6MWT)       Able to understand and use rate of perceived exertion (RPE) scale Yes       Intervention Provide education and explanation on how to use RPE scale       Expected Outcomes Short Term: Able to use RPE daily in rehab to express subjective intensity level;Long Term:  Able to use RPE to guide intensity level when exercising independently       Able to understand and use Dyspnea scale Yes        Intervention Provide education and explanation on how to use Dyspnea scale       Expected Outcomes Short Term: Able to use Dyspnea scale daily in rehab to express subjective sense of shortness of breath during exertion;Long Term: Able to use Dyspnea scale to guide intensity level when exercising independently       Knowledge and understanding of Target Heart Rate Range (THRR) Yes       Intervention Provide education and explanation of THRR including how the numbers were predicted and where they are located for reference       Expected Outcomes Short Term: Able to state/look up THRR;Short Term: Able to use daily as guideline for intensity in rehab;Long Term: Able to use THRR to govern intensity when exercising independently       Able to check pulse independently Yes       Intervention Provide education and demonstration on how to check pulse in carotid and radial arteries.;Review the importance of being able to check your own pulse for safety during independent exercise       Expected Outcomes Short Term: Able to explain why pulse checking is important during independent exercise;Long Term: Able to check pulse independently and accurately       Understanding of Exercise Prescription Yes       Intervention Provide education, explanation, and written materials on patient's individual exercise prescription       Expected Outcomes Short Term: Able to explain program exercise prescription;Long Term: Able to explain home exercise prescription to exercise independently              Exercise Goals Re-Evaluation :  Exercise Goals Re-Evaluation    Row Name 10/02/19 0955 10/09/19 1727           Exercise Goal Re-Evaluation   Exercise Goals Review Increase Physical Activity;Able to understand and use rate of perceived exertion (RPE) scale;Knowledge and understanding of Target Heart Rate Range (THRR);Understanding of Exercise Prescription;Increase Strength and Stamina;Able to check pulse independently  Increase Physical Activity;Able to understand and use rate of perceived exertion (RPE) scale;Knowledge and understanding of Target Heart Rate Range (THRR);Understanding of Exercise Prescription;Increase Strength and Stamina;Able to check pulse independently      Comments Reviewed RPE and dyspnea scales, THR and program prescription with pt today.  Pt voiced understanding and was given a copy of goals to take home. Gladies is doing well in rehab as it has been her first week. She is adjusting well to the exercise machines. HR has been maintained well within her range. Will continue to monitor.      Expected Outcomes Short: Use RPE daily to regulate intensity. Long: Follow program prescription in THR. Short: Continue attending  rehab regularly Long: Increase overall strength/ stamina             Discharge Exercise Prescription (Final Exercise Prescription Changes):  Exercise Prescription Changes - 10/09/19 1700      Response to Exercise   Blood Pressure (Admit) 118/62    Blood Pressure (Exercise) 132/64    Blood Pressure (Exit) 102/60    Heart Rate (Admit) 94 bpm    Heart Rate (Exercise) 117 bpm    Heart Rate (Exit) 88 bpm    Rating of Perceived Exertion (Exercise) 15    Comments first full day of exercise    Duration Progress to 30 minutes of  aerobic without signs/symptoms of physical distress      Progression   Progression Continue to progress workloads to maintain intensity without signs/symptoms of physical distress.      Resistance Training   Training Prescription Yes    Weight 3 lb    Reps 10-15      Treadmill   MPH 1.9    Grade 0.5    Minutes 15    METs 2.59      Recumbant Elliptical   Level 1    Minutes 15    METs 1.6           Nutrition:  Target Goals: Understanding of nutrition guidelines, daily intake of sodium <1517m, cholesterol <2088m calories 30% from fat and 7% or less from saturated fats, daily to have 5 or more servings of fruits and  vegetables.  Education: Controlling Sodium/Reading Food Labels -Group verbal and written material supporting the discussion of sodium use in heart healthy nutrition. Review and explanation with models, verbal and written materials for utilization of the food label.   Education: General Nutrition Guidelines/Fats and Fiber: -Group instruction provided by verbal, written material, models and posters to present the general guidelines for heart healthy nutrition. Gives an explanation and review of dietary fats and fiber.   [facility]  Date 09/25/19  Instruction Review Code 3- Needs Reinforcement  [need identified]      Biometrics:  Pre Biometrics - 09/25/19 1205      Pre Biometrics   Height 5' 1.9" (1.572 m)    Weight 150 lb 6.4 oz (68.2 kg)    BMI (Calculated) 27.61    Single Leg Stand 3.5 seconds            Nutrition Therapy Plan and Nutrition Goals:  Nutrition Therapy & Goals - 10/08/19 0916      Nutrition Therapy   Diet Heart healthy, Low Na    Protein (specify units) 55g    Fiber 25 grams    Whole Grain Foods 3 servings    Saturated Fats 12 max. grams    Fruits and Vegetables 5 servings/day    Sodium 1.5 grams      Personal Nutrition Goals   Nutrition Goal ST: include vegetables and fruit - try roasted, try daves killer bread LT: Doesn't want to have another heart event.    Comments B: toast with light margaine and applesauce and cinammon :L: half sandwich with tuKuwaitr ham or smart popcorn with almond milk S: fruit (plum, orange, watermelon) L: half sandwich, piece of chicken (mostly baked)  D: frozen diet dinner, 1/2 piece of baked chicken breast, half sandwich. whole grain bread. Discussed heart healthy changes.      Intervention Plan   Intervention Prescribe, educate and counsel regarding individualized specific dietary modifications aiming towards targeted  core components such as weight, hypertension, lipid  management, diabetes, heart failure and other comorbidities.;Nutrition handout(s) given to patient.    Expected Outcomes Short Term Goal: Understand basic principles of dietary content, such as calories, fat, sodium, cholesterol and nutrients.;Short Term Goal: A plan has been developed with personal nutrition goals set during dietitian appointment.;Long Term Goal: Adherence to prescribed nutrition plan.           Nutrition Assessments:  Nutrition Assessments - 09/25/19 1206      MEDFICTS Scores   Pre Score 32           MEDIFICTS Score Key:          ?70 Need to make dietary changes          40-70 Heart Healthy Diet         ? 40 Therapeutic Level Cholesterol Diet  Nutrition Goals Re-Evaluation:   Nutrition Goals Discharge (Final Nutrition Goals Re-Evaluation):   Psychosocial: Target Goals: Acknowledge presence or absence of significant depression and/or stress, maximize coping skills, provide positive support system. Participant is able to verbalize types and ability to use techniques and skills needed for reducing stress and depression.   Education: Depression - Provides group verbal and written instruction on the correlation between heart/lung disease and depressed mood, treatment options, and the stigmas associated with seeking treatment.   Education: Sleep Hygiene -Provides group verbal and written instruction about how sleep can affect your health.  Define sleep hygiene, discuss sleep cycles and impact of sleep habits. Review good sleep hygiene tips.     Education: Stress and Anxiety: - Provides group verbal and written instruction about the health risks of elevated stress and causes of high stress.  Discuss the correlation between heart/lung disease and anxiety and treatment options. Review healthy ways to manage with stress and anxiety.    Initial Review & Psychosocial Screening:  Initial Psych Review & Screening - 09/19/19 1038      Initial Review   Current  issues with None Identified      Family Dynamics   Good Support System? Yes    Comments She can look to her daughter for support with whome she lives with.She has a positive outlook on her health and overall mental state.      Barriers   Psychosocial barriers to participate in program The patient should benefit from training in stress management and relaxation.;There are no identifiable barriers or psychosocial needs.      Screening Interventions   Interventions Encouraged to exercise;To provide support and resources with identified psychosocial needs;Provide feedback about the scores to participant    Expected Outcomes Short Term goal: Utilizing psychosocial counselor, staff and physician to assist with identification of specific Stressors or current issues interfering with healing process. Setting desired goal for each stressor or current issue identified.;Long Term Goal: Stressors or current issues are controlled or eliminated.;Short Term goal: Identification and review with participant of any Quality of Life or Depression concerns found by scoring the questionnaire.;Long Term goal: The participant improves quality of Life and PHQ9 Scores as seen by post scores and/or verbalization of changes           Quality of Life Scores:   Quality of Life - 09/25/19 1205      Quality of Life   Select Quality of Life      Quality of Life Scores   Health/Function Pre 27.67 %    Socioeconomic Pre 30 %    Psych/Spiritual Pre 30 %  Family Pre 27 %    GLOBAL Pre 28.53 %          Scores of 19 and below usually indicate a poorer quality of life in these areas.  A difference of  2-3 points is a clinically meaningful difference.  A difference of 2-3 points in the total score of the Quality of Life Index has been associated with significant improvement in overall quality of life, self-image, physical symptoms, and general health in studies assessing change in quality of life.  PHQ-9: Recent Review  Flowsheet Data    Depression screen [facility] 2/9 09/25/2019   Decreased Interest 0   Down, Depressed, Hopeless 0   PHQ - 2 Score 0   Altered sleeping 0   Tired, decreased energy 1   Change in appetite 1   Feeling bad or failure about yourself  0   Trouble concentrating 0   Moving slowly or fidgety/restless 0   Suicidal thoughts 0   PHQ-9 Score 2   Difficult doing work/chores Not difficult at all     Interpretation of Total Score  Total Score Depression Severity:  1-4 = Minimal depression, 5-9 = Mild depression, 10-14 = Moderate depression, 15-19 = Moderately severe depression, 20-27 = Severe depression   Psychosocial Evaluation and Intervention:  Psychosocial Evaluation - 09/19/19 1040      Psychosocial Evaluation & Interventions   Interventions Encouraged to exercise with the program and follow exercise prescription    Comments She can look to her daughter for support with whome she lives with.She has a positive outlook on her health and overall mental state.    Expected Outcomes Short: Exercise regularly to support mental health and notify staff of any changes. Long: maintain mental health and well being through teaching of rehab or prescribed medications independently.    Continue Psychosocial Services  Follow up required by counselor           Psychosocial Re-Evaluation:   Psychosocial Discharge (Final Psychosocial Re-Evaluation):   Vocational Rehabilitation: Provide vocational rehab assistance to qualifying candidates.   Vocational Rehab Evaluation & Intervention:   Education: Education Goals: Education classes will be provided on a variety of topics geared toward better understanding of heart health and risk factor modification. Participant will state understanding/return demonstration of topics presented as noted by education test scores.  Learning Barriers/Preferences:  Learning Barriers/Preferences - 09/19/19 1037      Learning Barriers/Preferences   Learning  Barriers None    Learning Preferences None           General Cardiac Education Topics:  AED/CPR: - Group verbal and written instruction with the use of models to demonstrate the basic use of the AED with the basic ABC's of resuscitation.   Anatomy & Physiology of the Heart: - Group verbal and written instruction and models provide basic cardiac anatomy and physiology, with the coronary electrical and arterial systems. Review of Valvular disease and Heart Failure   Cardiac Rehab from 10/09/2019 in [facility] Cardiac and Pulmonary Rehab  Date 09/25/19  Instruction Review Code 3- Needs Reinforcement  [need identified]      Cardiac Procedures: - Group verbal and written instruction to review commonly prescribed medications for heart disease. Reviews the medication, class of the drug, and side effects. Includes the steps to properly store meds and maintain the prescription regimen. (beta blockers and nitrates)   Cardiac Medications I: - Group verbal and written instruction to review commonly prescribed medications for heart disease. Reviews the medication, class of the  drug, and side effects. Includes the steps to properly store meds and maintain the prescription regimen.   Cardiac Medications II: -Group verbal and written instruction to review commonly prescribed medications for heart disease. Reviews the medication, class of the drug, and side effects. (all other drug classes)    Go Sex-Intimacy & Heart Disease, Get SMART - Goal Setting: - Group verbal and written instruction through game format to discuss heart disease and the return to sexual intimacy. Provides group verbal and written material to discuss and apply goal setting through the application of the S.M.A.R.T. Method.   Other Matters of the Heart: - Provides group verbal, written materials and models to describe Stable Angina and Peripheral Artery. Includes description of the disease process and treatment options available to  the cardiac patient.   Infection Prevention: - Provides verbal and written material to individual with discussion of infection control including proper hand washing and proper equipment cleaning during exercise session.   Cardiac Rehab from 10/09/2019 in [facility] Cardiac and Pulmonary Rehab  Date 09/19/19  Educator [facility]  Instruction Review Code 1- Verbalizes Understanding      Falls Prevention: - Provides verbal and written material to individual with discussion of falls prevention and safety.   Cardiac Rehab from 10/09/2019 in [facility] Cardiac and Pulmonary Rehab  Date 09/19/19  Educator [facility]  Instruction Review Code 1- Verbalizes Understanding      Other: -Provides group and verbal instruction on various topics (see comments)   Knowledge Questionnaire Score:  Knowledge Questionnaire Score - 09/25/19 1206      Knowledge Questionnaire Score   Pre Score 23/26 Education Focus: Exercise, nutrition, angina           Core Components/Risk Factors/Patient Goals at Admission:  Personal Goals and Risk Factors at Admission - 09/25/19 1206      Core Components/Risk Factors/Patient Goals on Admission    Weight Management Yes;Weight Loss    Intervention Weight Management: Develop a combined nutrition and exercise program designed to reach desired caloric intake, while maintaining appropriate intake of nutrient and fiber, sodium and fats, and appropriate energy expenditure required for the weight goal.;Weight Management: Provide education and appropriate resources to help participant work on and attain dietary goals.    Admit Weight 150 lb 6.4 oz (68.2 kg)    Goal Weight: Short Term 145 lb (65.8 kg)    Goal Weight: Long Term 140 lb (63.5 kg)    Expected Outcomes Short Term: Continue to assess and modify interventions until short term weight is achieved;Long Term: Adherence to nutrition and physical activity/exercise program aimed toward attainment of established weight goal;Understanding  recommendations for meals to include 15-35% energy as protein, 25-35% energy from fat, 35-60% energy from carbohydrates, less than 231m of dietary cholesterol, 20-35 gm of total fiber daily;Understanding of distribution of calorie intake throughout the day with the consumption of 4-5 meals/snacks;Weight Loss: Understanding of general recommendations for a balanced deficit meal plan, which promotes 1-2 lb weight loss per week and includes a negative energy balance of [phone] kcal/d    Heart Failure Yes    Intervention Provide a combined exercise and nutrition program that is supplemented with education, support and counseling about heart failure. Directed toward relieving symptoms such as shortness of breath, decreased exercise tolerance, and extremity edema.    Expected Outcomes Improve functional capacity of life;Short term: Attendance in program 2-3 days a week with increased exercise capacity. Reported lower sodium intake. Reported increased fruit and vegetable intake. Reports medication compliance.;Short term: Daily  weights obtained and reported for increase. Utilizing diuretic protocols set by physician.;Long term: Adoption of self-care skills and reduction of barriers for early signs and symptoms recognition and intervention leading to self-care maintenance.    Hypertension Yes    Intervention Provide education on lifestyle modifcations including regular physical activity/exercise, weight management, moderate sodium restriction and increased consumption of fresh fruit, vegetables, and low fat dairy, alcohol moderation, and smoking cessation.;Monitor prescription use compliance.    Expected Outcomes Long Term: Maintenance of blood pressure at goal levels.;Short Term: Continued assessment and intervention until BP is < 140/33m HG in hypertensive participants. < 130/831mHG in hypertensive participants with diabetes, heart failure or chronic kidney disease.    Lipids Yes    Intervention Provide  education and support for participant on nutrition & aerobic/resistive exercise along with prescribed medications to achieve LDL <7068mHDL >45m62m  Expected Outcomes Short Term: Participant states understanding of desired cholesterol values and is compliant with medications prescribed. Participant is following exercise prescription and nutrition guidelines.;Long Term: Cholesterol controlled with medications as prescribed, with individualized exercise RX and with personalized nutrition plan. Value goals: LDL < 70mg72mL > 40 mg.           Education:Diabetes - Individual verbal and written instruction to review signs/symptoms of diabetes, desired ranges of glucose level fasting, after meals and with exercise. Acknowledge that pre and post exercise glucose checks will be done for 3 sessions at entry of program.   Education: Know Your Numbers and Risk Factors: -Group verbal and written instruction about important numbers in your health.  Discussion of what are risk factors and how they play a role in the disease process.  Review of Cholesterol, Blood Pressure, Diabetes, and BMI and the role they play in your overall health.   Core Components/Risk Factors/Patient Goals Review:    Core Components/Risk Factors/Patient Goals at Discharge (Final Review):    ITP Comments:  ITP Comments    Row Name 09/19/19 1036 09/25/19 1159 10/02/19 0955 10/08/19 1006 10/16/19 0554   ITP Comments Virtual Visit completed. Patient informed on EP and RD appointment and 6 Minute walk test. Patient also informed of patient health questionnaires on My Chart. Patient Verbalizes understanding. Visit diagnosis can be found in [facility]2021. Completed 6MWT and gym orientation. Initial ITP created and sent for review to Dr. Mark Emily Filbertical Director. First full day of exercise!  Patient was oriented to gym and equipment including functions, settings, policies, and procedures.  Patient's individual exercise prescription and  treatment plan were reviewed.  All starting workloads were established based on the results of the 6 minute walk test done at initial orientation visit.  The plan for exercise progression was also introduced and progression will be customized based on patient's performance and goals. Completed Initial RD Evaluation 30 Day review completed. Medical Director ITP review done, changes made as directed, and signed approval by Medical Director.          Comments:

## 2019-10-18 ENCOUNTER — Other Ambulatory Visit: Payer: Self-pay

## 2019-10-18 DIAGNOSIS — I214 Non-ST elevation (NSTEMI) myocardial infarction: Secondary | ICD-10-CM

## 2019-10-18 DIAGNOSIS — Z955 Presence of coronary angioplasty implant and graft: Secondary | ICD-10-CM

## 2019-10-18 NOTE — Progress Notes (Signed)
Daily Session Note  Patient Details  Name: Cheryl Briggs MRN: [id number] Date of Birth: 08-02-1945 Referring Provider:     Cardiac Rehab from 09/25/2019 in [facility]  Referring Provider Kathlyn Sacramento MD      Encounter Date: 10/18/2019  Check In:  Session Check In - 10/18/19 1000      Check-In   Supervising physician immediately available to respond to emergencies See telemetry face sheet for immediately available ER MD    Location [facility]-Cardiac & Pulmonary Rehab    Staff Present Justin Mend RCP,RRT,BSRT;Vida Rigger RN, BSN;Melissa Caiola RDN, LDN    Virtual Visit No    Medication changes reported     No    Fall or balance concerns reported    No    Warm-up and Cool-down Performed on first and last piece of equipment    Resistance Training Performed Yes    VAD Patient? No    PAD/SET Patient? No      Pain Assessment   Currently in Pain? No/denies              Social History   Tobacco Use  Smoking Status Never Smoker  Smokeless Tobacco Never Used    Goals Met:  Proper associated with RPD/PD & O2 Sat Independence with exercise equipment Exercise tolerated well No report of cardiac concerns or symptoms Strength training completed today  Goals Unmet:  Not Applicable  Comments: Pt able to follow exercise prescription today without complaint.  Will continue to monitor for progression.   Dr. Emily Filbert is Medical Director for [name].

## 2019-10-23 ENCOUNTER — Other Ambulatory Visit: Payer: Self-pay

## 2019-10-23 ENCOUNTER — Encounter: Payer: Medicare Other | Admitting: *Deleted

## 2019-10-23 DIAGNOSIS — I214 Non-ST elevation (NSTEMI) myocardial infarction: Secondary | ICD-10-CM | POA: Diagnosis not present

## 2019-10-23 DIAGNOSIS — Z955 Presence of coronary angioplasty implant and graft: Secondary | ICD-10-CM

## 2019-10-23 NOTE — Progress Notes (Signed)
Office Visit    Patient Name: Cheryl Briggs Date of Encounter: 10/24/2019  Primary Care Provider:  Joyice Briggs, [place] Primary Cardiologist: Dr. Fletcher Briggs  Chief Complaint    Chief Complaint  Patient presents with  . OTHER    1 month f/u c/o chest discomfort. Meds reviewed verbally with pt.    74 y.o. female with a hx of CLL, arthritis, secondhand smoke exposure, admission 08/2019 with non-ST elevation myocardial infarction with subsequent LHC 8/2021with significant 3v CAD and complicated/staged PCI (02/10/09 dRCA, 08/19/19 dLCx), PCI on 8/6 complicated by no reflow and bradycardia/hypotension/atrial fibrillation with RVR, post procedure right forearm hematoma, HFpEF, hypertension, mitral regurgitation, hyperlipidemia, and here for follow-up since starting cardiac rehab.   Past Medical History    Past Medical History:  Diagnosis Date  . Allergy   . Arthritis   . Cancer ([place])    CLL   Past Surgical History:  Procedure Laterality Date  . BACK SURGERY    . BLADDER REPAIR    . carpel tunnell Bilateral   . CHOLECYSTECTOMY    . COLONOSCOPY  2015  . CORONARY STENT INTERVENTION N/A 08/16/2019   Procedure: CORONARY STENT INTERVENTION;  Surgeon: Cheryl Bush, MD;  Location: [facility];  Service: Cardiovascular;  Laterality: N/A;  . CORONARY STENT INTERVENTION Left 08/19/2019   Procedure: CORONARY STENT INTERVENTION;  Surgeon: Cheryl Hampshire, MD;  Location: [facility];  Service: Cardiovascular;  Laterality: Left;  . LEFT HEART CATH AND CORONARY ANGIOGRAPHY N/A 08/16/2019   Procedure: LEFT HEART CATH AND CORONARY ANGIOGRAPHY;  Surgeon: Cheryl Bush, MD;  Location: [facility];  Service: Cardiovascular;  Laterality: N/A;  . SHOULDER SURGERY Bilateral   . UPPER EXTREMITY ANGIOGRAPHY Right 08/21/2019   Procedure: UPPER EXTREMITY ANGIOGRAPH;  Surgeon: Cheryl Huxley, MD;  Location: [facility];  Service: Cardiovascular;  Laterality: Right;     Allergies  Allergies  Allergen Reactions  . Corticosteroids     Other reaction(s): Headache  . Other Other (See Comments)  . Sulfa Antibiotics Anaphylaxis  . Clarithromycin     Other reaction(s): Other (See Comments) Rectal bleeding   . Cymbalta [Duloxetine Hcl] Diarrhea  . Prednisone Other (See Comments)    Color changes in face, headache and difficulty walking    History of Present Illness    Cheryl Briggs is a 74 y.o. female with PMH as above. Cheryl Briggs is a 74 yo female with recent 08/2019 admission with newly diagnosed CAD and s/p PCI.    Prior to this admission, she had no known history of heart dz. She reported exposure to secondhand smoke for approximately 20 years via her husband. She did not smoke herself. No previously known history of hypertension.  No alcohol or drug use.  She had intermittent LEE and usually walked in the afternoons to try and stay active, though did note some decreased exercise tolerance. Then, for the 2 weeks leading up to admission, she reported avoiding her normal afternoon walks due to chest discomfort.  This CP eventually progressed to also include discomfort at rest.    The morning of her admission, she felt 10/10 chest pain and pressure in her center of her chest with associated shortness of breath, diaphoresis, nausea, emesis, and dizziness.  She also felt weakness and fatigue. She was uncertain if she had racing heart rate or palpitations.  She had nausea and emesis x1.  This was the most severe in intensity episode and concerning to her  with subsequent presentation to the ED and admission. In the [facility] ED, labs significant for HS Tn 171 with peak at 341. EKG NSR, 71 bpm, T wave inversion in inferior and anterior leads, low voltage QRS, poor R wave progression in precordial leads, QTC 452.  CXR without active dz. Cardiology was consulted and pt scheduled for LHC.   8/5 showed EF 55-60% with no RWMA and mild LVH. Also noted was mild MR  with mild to moderate aortic valve sclerosis / calcification without evidence of stenosis.  Left heart cath was performed 08/16/2019 and showed severe multivessel CAD, including 80% D2 lesion, sequential 60% proximal and 80% distal left circumflex stenosis, chronic total occlusion of OM1, 50 to 60% mRCA stenosis, and thrombotic 99% dRCA lesion extending into the rPDA.  She had moderately elevated LV filling pressures.  Complex PCI was performed to the dRCA, rPDA, and rPL using DES extending from the dRCA into the rPDA, as well as angioplasty performed of the ostial rPL branch.  Distal RCA PCI was complicated by no reflow in the distal branches.  She subsequently had transient bradycardia and hypotension with development of atrial fibrillation with RVR.  Amiodarone infusion was initiated.  Final angiogram showed 0% stenosis in the distal RCA/RPDA and 10% residual stenosis of the ostial RPL. It was thought that the culprit of her non-ST elevation myocardial infarction was the thrombotic lesion of the distal RCA.  After catheterization, she was transferred to the ICU for ongoing management and continued on tirofiban infusion for 18 hours.  DAPT with ASA and ticagrelor was recommended for 12 months.  Relook catheterization with likely PCI to left circumflex was scheduled.  Limited echo to reevaluate LVEF following complex PCI with transient no flow involving the distal RCA was recommended.  Aggressive secondary prevention and escalation of statin therapy to atorvastatin 80 mg was also recommended. It was thought that her atrial fibrillation was 2/2 complicated PCI and mediated by acute ischemia, as well as administration of atropine and norepinephrine.  Norepinephrine was turned off.  She was continued on amiodarone infusion and metoprolol tartrate 25 mg every 6 hours.  Given her elevated filling pressures, she was started on IV Lasix.  Repeat echo 08/17/2019 showed EF 60 to 65%, no regional wall motion abnormalities,  mild concentric left ventricular hypertrophy, G1 DD, elevated LV end-diastolic filling pressure.  Trivial MR was noted.  No aortic regurgitation or stenosis was noted.  She underwent repeat intervention 08/19/2019.  It was noted she had a widely patent RCA stent with normal flow distally.  Unchanged left CAD.  Mildly elevated left ventricular end-diastolic pressure was measured at 14 mmHg.  Successful direct stenting was performed with the distal left circumflex.  Post procedure, she experienced a right forearm hematoma with recommendation for conservative management.  After discharge, she was seen in clinic 09/02/2019 and noted she was overall doing well; however, she noted concern regarding continued fatigue and dyspnea since her intervention.  She had not yet started cardiac rehab with orders placed.  No chest pain, racing heart rate, palpitations.  She reported significant improvement in her right arm hematoma.  Right femoral arteriotomy site without signs of erythema, warmth, infection, or hematoma.  She was very satisfied with the care that she received at [facility] and eager to express her thanks to the entire [facility] team and hospital team involved in her care.  She intended to get the shingles vaccine which was relayed to be okay from a cardiac standpoint.  On 09/24/2019,  she continued to note dyspnea and fatigue since her intervention.  She had not yet started cardiac rehab but scheduled to start 9/15. She was taking a nap in the a.m. and afternoon, which was unusual for her.  She had dyspnea with walking long distances but did not experience any dyspnea on her walk into the office.  She was drinking 2 to 3 glasses of water daily in a 32 ounce cup.  She had resolution of her right arm hematoma.  Right radial and right femoral arteriotomy sites healed.  She has been tolerating the losartan well with blood pressure 130/72 in the setting of joint pain.  She noted SBP 140s and to what extent her pain was  contributing to these pressures.  She intended to start tai chi from a chair, consisting of slow stretching movements.  She reports that, since discontinuing her Voltaren gel, she had continued to experience joint pain.  She had tried several over-the-counter and prescribed pain relief options, including recently prescribed tramadol-acetaminophen 37.5 every 6 hours as needed.  She was going to get the flu vaccine.   Today, 10/24/2019, she returns to clinic and notes improvement in dyspnea and fatigue since starting cardiac rehab.  BP today well controlled at 126/64 with patient reporting that she has also noted an improvement in her BP.  She reports improvement when walking, stating that before she used to have to stop periodically when walking.  She does report that the elliptical from cardiac rehab is challenging; however, she is very satisfied with her improvement in fatigue and dyspnea.  She has not yet started tai chi, as she did not feel she would be able to do that from the chair.  She has tried Biofreeze, as recommended at past visits, and reports that this is helped to relieve a lot of her joint pain.  She still has joint pain that is bad on the right side of her back; however, she is glad to have the [place].  She received her Covid booster yesterday 10/13 and is hopeful to soon receive her flu shot.  She felt a little bit of chest pain on the left side of her chest early in the morning.  This chest pain was described as sharp, brief, and different from that before her PCI as outlined above.  She reports also feeling this on the treadmill once; however, again, chest pain was described as sharp and brief. No PND, LEE, weight gain, or early satiety. She denies any signs or symptoms of bleeding.  She reports medication compliance.  Overall, she has been very satisfied with her improvement since starting cardiac rehab.   Home Medications    Prior to Admission medications   Medication Sig Start Date  End Date Taking? Authorizing Provider  aspirin EC 81 MG EC tablet Take 1 tablet (81 mg total) by mouth daily. Swallow whole. 08/19/19  Yes Jennye Boroughs, MD  atorvastatin (LIPITOR) 80 MG tablet Take 1 tablet (80 mg total) by mouth daily. 08/19/19  Yes Jennye Boroughs, MD  Bioflavonoid Products (ESTER C PO) Take 1,000 mg by mouth daily.   Yes [provider]  calcium citrate-vitamin D (CITRACAL+D) 315-200 MG-UNIT tablet Take 1 tablet by mouth 2 (two) times daily.   Yes [provider]  Chlorpheniramine Maleate (CHLOR-TABLETS PO) Take 1 tablet by mouth as needed.    Yes [provider]  Cholecalciferol (VITAMIN D-1000 MAX ST) 25 MCG (1000 UT) tablet Take 3,000 Units by mouth daily.   Yes [provider]  desonide (DESOWEN) 0.05 % ointment desonide 0.05 % topical ointment 02/21/17  Yes [provider]  gabapentin (NEURONTIN) 300 MG capsule Take 300 mg by mouth 3 (three) times daily. 08/26/19  Yes [provider]  metoprolol succinate (TOPROL-XL) 50 MG 24 hr tablet Take 1.5 tablets (75 mg total) by mouth daily. 08/20/19  Yes Jennye Boroughs, MD  Multiple Vitamins-Minerals (MULTIVITAMIN WITH MINERALS) tablet Take 1 tablet by mouth daily.   Yes [provider]  Omeprazole (PRILOSEC PO) Take 1 tablet by mouth daily.    Yes [provider]  ticagrelor (BRILINTA) 90 MG TABS tablet Take 1 tablet (90 mg total) by mouth 2 (two) times daily. 08/19/19 08/18/20 Yes Jennye Boroughs, MD    Review of Systems    She reports improvement in previously reported fatigue and dyspnea since starting cardiac rehab. She reports atypical chest pain as described above that is sharp, brief, and without clear triggers/both exertional and nonexertional.  No palpitations, pnd, orthopnea, n, v, dizziness, syncope, weight gain, or early satiety.  She reports joint pain, though does note benefit from [place].  All other systems reviewed and are otherwise negative except as  noted above.  Physical Exam    VS:  BP 126/64 (BP Location: Left Arm, Patient Position: Sitting, Cuff Size: Normal)   Pulse 84   Ht '5\' 2"'  (1.575 m)   Wt 149 lb 6 oz (67.8 kg)   SpO2 98%   BMI 27.32 kg/m  , BMI Body mass index is 27.32 kg/m. GEN: Well nourished, well developed, in no acute distress. HEENT: normal. Neck: Supple, no JVD, carotid bruits, or masses. Cardiac: RRR, 1/6 systolic murmurs, rubs, or gallops. No clubbing, cyanosis, edema.  Radials/DP/PT 2+ and equal bilaterally.     Respiratory:  Respirations regular and unlabored, clear to auscultation bilaterally. GI: Soft, nontender, nondistended, BS + x 4. MS: no deformity or atrophy. Skin: warm and dry, no rash. Neuro:  Strength and sensation are intact. Psych: Normal affect.  Accessory Clinical Findings    ECG personally reviewed by me today -NSR, 84 bpm, low voltage QRS, ongoing changes in inferior leads not new when compared with prior tracings- no acute changes.  VITALS Reviewed today   Temp Readings from Last 3 Encounters:  08/22/19 97.8 F (36.6 C) (Oral)  04/29/19 (!) 97.5 F (36.4 C) (Tympanic)  02/28/18 97.7 F (36.5 C) (Oral)   BP Readings from Last 3 Encounters:  10/24/19 126/64  09/24/19 130/72  09/02/19 130/70   Pulse Readings from Last 3 Encounters:  10/24/19 84  09/24/19 84  09/02/19 72    Wt Readings from Last 3 Encounters:  10/24/19 149 lb 6 oz (67.8 kg)  09/25/19 150 lb 6.4 oz (68.2 kg)  09/24/19 151 lb 8 oz (68.7 kg)     LABS  reviewed today   Lab Results  Component Value Date   WBC 12.8 (H) 08/22/2019   HGB 12.4 08/22/2019   HCT 37.1 08/22/2019   MCV 97.9 08/22/2019   PLT 294 08/22/2019   Lab Results  Component Value Date   CREATININE 0.77 09/24/2019   BUN 8 09/24/2019   NA 140 09/24/2019   K 4.8 09/24/2019   CL 102 09/24/2019   CO2 21 09/24/2019   Lab Results  Component Value Date   ALT 29 10/06/2017   AST 36 10/06/2017   ALKPHOS 75 10/06/2017   BILITOT 0.6  10/06/2017   Lab Results  Component Value Date   CHOL 139 09/24/2019  HDL 61 09/24/2019   LDLCALC 54 09/24/2019   LDLDIRECT 51 09/24/2019   TRIG 138 09/24/2019   CHOLHDL 2.3 09/24/2019    Lab Results  Component Value Date   HGBA1C 6.0 (H) 08/15/2019   Lab Results  Component Value Date   TSH 2.048 08/16/2019     STUDIES/PROCEDURES reviewed today   2D echo 08/15/2019: 1. Left ventricular ejection fraction, by estimation, is 55 to 60%. The  left ventricle has normal function. The left ventricle has no regional  wall motion abnormalities. There is mild left ventricular hypertrophy.  Left ventricular diastolic parameters  were normal.  2. Right ventricular systolic function is normal. The right ventricular  size is normal. There is normal pulmonary artery systolic pressure.  3. The mitral valve is normal in structure. Mild mitral valve  regurgitation. No evidence of mitral stenosis.  4. The aortic valve is abnormal. Aortic valve regurgitation is not  visualized. Mild to moderate aortic valve sclerosis/calcification is  present, without any evidence of aortic stenosis.  __________   LHC 08/16/2019: A drug-eluting stent was successfully placed using a STENT SYNERGY DES 2.25X20.  A stent was successfully placed. Conclusions: 1. 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 2. Moderately elevated left ventricular filling pressure. 3. Complex PCI to distal RCA, rPDA, and rPLA using Synergy 2.25 x 20 mm drug-eluting stent extending from the distal RCA into the rPDA, as well as angioplasty of the ostial rPL branch. Intervention was complicated by no-reflow with transient hypotension/bradycardia and subsequent development of atrial fibrillation with rapid ventricular response. Final  angiogram shows 0% residual stenosis in the distal RCA/rPDA and 10% residual stenosis at ostial rPL with TIMI-2 flow. Recommendations: 1. Transfer to ICU for aggressive medical therapy and post-PCI monitoring. 2. Continue tirofiban infusion for 18 hours. 3. Dual antiplatelet therapy with aspirin and ticagrelor for at least 12 months. 4. Titrate nitroglycerine infusion for relief of chest pain. 5. Continue amiodarone infusion for rate control and hopefully conversion to sinus rhythm. If patient remains in atrial fibrillation tomorrow, IV heparin should be started (will defer heparin at this time given tirofiban infusion). 6. Aggressive secondary prevention. 7. Anticipate relook catheterization on Monday with PCI to the LCx. I favor medical therapy for D2, given relatively small vessel size and need to stent back to the LAD, were intervention undertaken. __________  Limited 2D echo 08/17/2019: 1. Left ventricular ejection fraction, by estimation, is 60 to 65%. The  left ventricle has normal function. The left ventricle has no regional  wall motion abnormalities. There is mild concentric left ventricular  hypertrophy. Left ventricular diastolic  parameters are consistent with Grade I diastolic dysfunction (impaired  relaxation). Elevated left ventricular end-diastolic pressure.  2. Right ventricular systolic function is normal. The right ventricular  size is normal.  3. The mitral valve is normal in structure. Trivial mitral valve  regurgitation. No evidence of mitral stenosis.  4. The aortic valve is tricuspid. Aortic valve regurgitation is not  visualized. No aortic stenosis is present. __________  LHC 08/19/2019:  2nd Diag lesion is 80% stenosed.  1st Mrg lesion is 100% stenosed.  Prox Cx to Mid Cx lesion is 60% stenosed.  Dist Cx lesion is 80% stenosed.  Mid RCA lesion is 55% stenosed.  RPAV lesion is 10% stenosed.  Balloon angioplasty was performed.  Previously placed  Dist RCA drug-eluting stents are widely patent with 0% stenosed side branch in RPDA.  Post intervention, there is a 0% residual stenosis.  A drug-eluting stent was successfully placed using a STENT RESOLUTE ONYX 2.75X18.  1. Widely patent RCA stent with normal flow distally. Unchanged left coronary artery disease. 2. Mildly elevated left ventricular end-diastolic pressure at 14 mmHg. 3. Successful direct stenting of the distal left circumflex. Recommendations: Continue dual antiplatelet therapy for 1 year. Aggressive treatment of risk factors. If the patient remains stable, she can potentially be discharged home in the afternoon.  Assessment & Plan   CAD s/p complicated PCI Atypical chest pain --No CP.  She reports significantly improved dyspnea and fatigue since starting cardiac rehab.  She does report atypical CP that is brief, exertional /nonexertional, sharp chest pain as outlined above.  This pain is different than that experienced before her NSTEMI/ PCI and without any associated symptoms or clear triggers. She will let us know if any new or concerning sx.  EKG without acute changes.  Low suspicion CP is 2/2 cardiac insufficiency due to the atypical nature of the CP. Consider GI etiology. Continue omeprazole. Reassess at RTC.  --S/p NSTEMI with cath showing multivessel CAD. Culprit lesion felt to be thrombotic distal RCA with PCI to the distal RCA, RPDA, and RPL, complicated by no reflow after stent post dilation with resultant hypotension, bradycardia, and ultimate development of atrial fibrillation with RVR.  Repeat cath with patent RCA stent and subsequent PCI of the dLCx.  Repeat limited echo showed normal EF 55 to 60%.  --Continue Toprol and DAPT with ASA and ticagrelor for at least 12 months.  No signs or symptoms of bleeding.  Continue secondary prevention of risk factors, including statin therapy.  Atrial fibrillation with RVR, s/p PCI --Denies any racing heart rate or  palpitations. A. fib with RVR developed during complicated PCI and thought likely mediated by acute ischemia and administration of atropine and norepinephrine.  She was discharged on oral amiodarone, which has since been discontinued.  She is maintaining NSR.  No report of racing heart rate or palpitations.  Continue Toprol for rate control.  No recommendation for anticoagulation during her admission and given her likely etiology of her atrial fibrillation was 2/2 acute ischemia during catheterization.  Chronic HFpEF --Euvolemic and well compensated on exam.  Reports improvement in dyspnea with start of cardiac rehab.  LVEF moderately elevated at the time of catheterization, though noted to be normal on her previous echo.  She is not on a diuretic with no indication to start a diuretic today.  Estimated dry weight 69-70kg with weight today 67.8 kg and down from her previous clinic weight.  BP also improved from previous clinic visit.  She continues on a low-salt diet and total fluid under 2 L as recommended.  Continue current Toprol and losartan.   HLD --LDL 51 and at goal of below 70 per most recent labs.  Continue high intensity atorvastatin 80 mg daily for target LDL less than 70.    Hypertension -BP today well controlled at 126/64.  No medication changes.  Continue current Toprol and losartan.  Continue to monitor BP at home with goal BP 130/80 or lower.  Mitral regurgitation --Mild to moderate by initial echo.  Continue to monitor with periodic echo as indicated.  Elevated A1C -Previous A1c elevated at 6.0.  Recommend glycemic control and management per PCP.  Continue losartan given comorbid HTN.    Medication changes: None.  Labs ordered:  None.. Studies / Imaging ordered: None.  Disposition: RTC 3 months  Arvil Chaco, PA-C 10/24/2019

## 2019-10-23 NOTE — Progress Notes (Signed)
3Daily Session Note  Patient Details  Name: Cheryl Briggs MRN: [id number] Date of Birth: [birth year] Referring Provider:     Cardiac Rehab from 09/25/2019 in [facility]  Referring Provider Kathlyn Sacramento MD      Encounter Date: 10/23/2019  Check In:  Session Check In - 10/23/19 1111      Check-In   Supervising physician immediately available to respond to emergencies See telemetry face sheet for immediately available ER MD    Location [facility]    Staff Present Renita Papa, RN BSN;Joseph [address] [place], BS, ACSM CEP, Exercise Physiologist;Jessica Muldrow, MA, RCEP, CCRP, [place], [place], ACSM CEP, Exercise Physiologist    Virtual Visit No    Medication changes reported     No    Fall or balance concerns reported    No    Warm-up and Cool-down Performed on first and last piece of equipment    Resistance Training Performed Yes    VAD Patient? No    PAD/SET Patient? No      Pain Assessment   Currently in Pain? No/denies             Exercise Prescription Changes - 10/23/19 1000      Response to Exercise   Blood Pressure (Admit) 132/64    Blood Pressure (Exercise) 152/80    Blood Pressure (Exit) 128/60    Heart Rate (Admit) 86 bpm    Heart Rate (Exercise) 130 bpm    Heart Rate (Exit) 105 bpm    Rating of Perceived Exertion (Exercise) 15    Symptoms none    Duration Continue with 30 min of aerobic exercise without signs/symptoms of physical distress.    Intensity THRR unchanged      Progression   Progression Continue to progress workloads to maintain intensity without signs/symptoms of physical distress.    Average METs 3.04      Resistance Training   Training Prescription Yes    Weight 3 lb    Reps 10-15      Interval Training   Interval Training No      Treadmill   MPH 2.3    Grade 0.5    Minutes 15    METs 2.92      Recumbant Elliptical   Level 1    Minutes 15    METs 1.6       REL-XR   Level 1    Minutes 15    METs 4.6           Social History   Tobacco Use  Smoking Status Never Smoker  Smokeless Tobacco Never Used    Goals Met:  Independence with exercise equipment Exercise tolerated well No report of cardiac concerns or symptoms Strength training completed today  Goals Unmet:  Not Applicable  Comments: Pt able to follow exercise prescription today without complaint.  Will continue to monitor for progression.    Dr. Emily Filbert is Medical Director for [name].

## 2019-10-24 ENCOUNTER — Ambulatory Visit (INDEPENDENT_AMBULATORY_CARE_PROVIDER_SITE_OTHER): Payer: Medicare Other | Admitting: Physician Assistant

## 2019-10-24 ENCOUNTER — Other Ambulatory Visit: Payer: Self-pay

## 2019-10-24 ENCOUNTER — Encounter: Payer: Self-pay | Admitting: Physician Assistant

## 2019-10-24 VITALS — BP 126/64 | HR 84 | Ht 62.0 in | Wt 149.4 lb

## 2019-10-24 DIAGNOSIS — Z8679 Personal history of other diseases of the circulatory system: Secondary | ICD-10-CM

## 2019-10-24 DIAGNOSIS — I2510 Atherosclerotic heart disease of native coronary artery without angina pectoris: Secondary | ICD-10-CM

## 2019-10-24 DIAGNOSIS — E785 Hyperlipidemia, unspecified: Secondary | ICD-10-CM

## 2019-10-24 DIAGNOSIS — R0789 Other chest pain: Secondary | ICD-10-CM

## 2019-10-24 DIAGNOSIS — I252 Old myocardial infarction: Secondary | ICD-10-CM

## 2019-10-24 DIAGNOSIS — I10 Essential (primary) hypertension: Secondary | ICD-10-CM | POA: Diagnosis not present

## 2019-10-24 DIAGNOSIS — I5032 Chronic diastolic (congestive) heart failure: Secondary | ICD-10-CM

## 2019-10-24 DIAGNOSIS — I340 Nonrheumatic mitral (valve) insufficiency: Secondary | ICD-10-CM

## 2019-10-24 NOTE — Patient Instructions (Signed)
Medication Instructions:   1. Your physician recommends that you continue on your current medications as directed. Please refer to the Current Medication list given to you today.  *If you need a refill on your cardiac medications before your next appointment, please call your pharmacy*   Lab Work:  1. None Ordered  If you have labs (blood work) drawn today and your tests are completely normal, you will receive your results only by: Marland Kitchen MyChart Message (if you have MyChart) OR . A paper copy in the mail If you have any lab test that is abnormal or we need to change your treatment, we will call you to review the results.   Testing/Procedures:  1. None Ordered   Follow-Up: At [facility], you and your health needs are our priority.  As part of our continuing mission to provide you with exceptional heart care, we have created designated Provider Care Teams.  These Care Teams include your primary Cardiologist (physician) and Advanced Practice Providers (APPs -  Physician Assistants and Nurse Practitioners) who all work together to provide you with the care you need, when you need it.  We recommend signing up for the patient portal called "MyChart".  Sign up information is provided on this After Visit Summary.  MyChart is used to connect with patients for Virtual Visits (Telemedicine).  Patients are able to view lab/test results, encounter notes, upcoming appointments, etc.  Non-urgent messages can be sent to your provider as well.   To learn more about what you can do with MyChart, go to [email or web address].    Your next appointment:   3 month(s)  The format for your next appointment:   In Person  Provider:   You may see Kathlyn Sacramento, MD or one of the following Advanced Practice Providers on your designated Care Team:     Marrianne Mood, [place]

## 2019-10-25 ENCOUNTER — Encounter: Payer: Medicare Other | Admitting: *Deleted

## 2019-10-25 DIAGNOSIS — I214 Non-ST elevation (NSTEMI) myocardial infarction: Secondary | ICD-10-CM | POA: Diagnosis not present

## 2019-10-25 DIAGNOSIS — Z955 Presence of coronary angioplasty implant and graft: Secondary | ICD-10-CM

## 2019-10-25 NOTE — Progress Notes (Signed)
Daily Session Note  Patient Details  Name: Cheryl Briggs MRN: [id number] Date of Birth: 12/13/1945 Referring Provider:     Cardiac Rehab from 09/25/2019 in [facility]  Referring Provider Arida, Muhammad MD      Encounter Date: 10/25/2019  Check In:  Session Check In - 10/25/19 1018      Check-In   Supervising physician immediately available to respond to emergencies See telemetry face sheet for immediately available ER MD    Location [facility]    Staff Present Meredith Craven, RN BSN;Joseph Hood RCP,RRT,BSRT;Jessica Hawkins, MA, RCEP, CCRP, CCET    Virtual Visit No    Medication changes reported     No    Fall or balance concerns reported    No    Warm-up and Cool-down Performed on first and last piece of equipment    Resistance Training Performed Yes    VAD Patient? No    PAD/SET Patient? No      Pain Assessment   Currently in Pain? No/denies              Social History   Tobacco Use  Smoking Status Never Smoker  Smokeless Tobacco Never Used    Goals Met:  Independence with exercise equipment Exercise tolerated well No report of cardiac concerns or symptoms Strength training completed today  Goals Unmet:  Not Applicable  Comments: Pt able to follow exercise prescription today without complaint.  Will continue to monitor for progression.    Dr. Mark Miller is Medical Director for [facility] and [name]. 

## 2019-10-30 ENCOUNTER — Other Ambulatory Visit: Payer: Self-pay

## 2019-10-30 ENCOUNTER — Encounter: Payer: Medicare Other | Admitting: *Deleted

## 2019-10-30 DIAGNOSIS — Z955 Presence of coronary angioplasty implant and graft: Secondary | ICD-10-CM

## 2019-10-30 DIAGNOSIS — I214 Non-ST elevation (NSTEMI) myocardial infarction: Secondary | ICD-10-CM

## 2019-10-30 NOTE — Progress Notes (Signed)
Daily Session Note  Patient Details  Name: Cheryl Briggs MRN: [id number] Date of Birth: 20-Jul-1945 Referring Provider:     Cardiac Rehab from 09/25/2019 in [facility] Card[facility] and [facility] Rehab  Referring Provider Kathlyn Sacramento MD      Encounter Date: 10/30/2019  Check In:  Session Check In - 10/30/19 1139      Check-In   Supervising physician immediately available to respond to emergencies See telemetry face sheet for immediately available ER MD    Location [facility]    Staff Present Birdie Sons, MPA, RN;Jermika Olden, RN, BSN, CCRP;Jessica [place], MA, RCEP, CCRP, CCET;Joseph [employer], [place], ACSM CEP, Exercise Physiologist    Virtual Visit No    Medication changes reported     No    Fall or balance concerns reported    No    Warm-up and Cool-down Performed on first and last piece of equipment    Resistance Training Performed Yes    VAD Patient? No    PAD/SET Patient? No      Pain Assessment   Currently in Pain? No/denies              Social History   Tobacco Use  Smoking Status Never Smoker  Smokeless Tobacco Never Used    Goals Met:  Independence with exercise equipment Exercise tolerated well No report of cardiac concerns or symptoms  Goals Unmet:  Not Applicable  Comments: Pt able to follow exercise prescription today without complaint.  Will continue to monitor for progression.    Dr. Emily Filbert is Medical Director for [place] and [name].

## 2019-11-01 ENCOUNTER — Encounter: Payer: Medicare Other | Admitting: *Deleted

## 2019-11-01 ENCOUNTER — Other Ambulatory Visit: Payer: Self-pay

## 2019-11-01 DIAGNOSIS — I214 Non-ST elevation (NSTEMI) myocardial infarction: Secondary | ICD-10-CM | POA: Diagnosis not present

## 2019-11-01 DIAGNOSIS — Z955 Presence of coronary angioplasty implant and graft: Secondary | ICD-10-CM

## 2019-11-01 NOTE — Progress Notes (Signed)
Daily Session Note  Patient Details  Name: Cheryl Briggs MRN: [id number] Date of Birth: February 21, 1945 Referring Provider:     Cardiac Rehab from 09/25/2019 in [facility] Cardiac and Pulmonary Rehab  Referring Provider Kathlyn Sacramento MD      Encounter Date: 11/01/2019  Check In:  Session Check In - 11/01/19 1008      Check-In   Supervising physician immediately available to respond to emergencies See telemetry face sheet for immediately available ER MD    Location [facility]    Staff Present Renita Papa, RN BSN;Joseph [address] [place], [place], [place], CCRP, CCET    Virtual Visit No    Medication changes reported     No    Fall or balance concerns reported    No    Warm-up and Cool-down Performed on first and last piece of equipment    Resistance Training Performed Yes    VAD Patient? No    PAD/SET Patient? No      Pain Assessment   Currently in Pain? No/denies              Social History   Tobacco Use  Smoking Status Never Smoker  Smokeless Tobacco Never Used    Goals Met:  Independence with exercise equipment Exercise tolerated well No report of cardiac concerns or symptoms Strength training completed today  Goals Unmet:  Not Applicable  Comments: Pt able to follow exercise prescription today without complaint.  Will continue to monitor for progression.    Dr. Emily Filbert is Medical Director for [place] and [name].

## 2019-11-05 ENCOUNTER — Other Ambulatory Visit: Payer: Self-pay | Admitting: Physician Assistant

## 2019-11-05 MED ORDER — METOPROLOL SUCCINATE ER 50 MG PO TB24: 75.0000 mg | ORAL_TABLET | Freq: Every day | ORAL | 1 refills | Status: DC

## 2019-11-05 NOTE — Telephone Encounter (Signed)
*  STAT* If patient is at the pharmacy, call can be transferred to refill team.   1. Which medications need to be refilled? (please list name of each medication and dose if known) metoprolol 50 mg (1.5 tablets daily)  2. Which pharmacy/location (including street and city if local pharmacy) is medication to be sent to? [facility] in [place]  3. Do they need a 30 day or 90 day supply? 90  originally prescribed in hospital

## 2019-11-08 ENCOUNTER — Other Ambulatory Visit: Payer: Self-pay

## 2019-11-08 DIAGNOSIS — I214 Non-ST elevation (NSTEMI) myocardial infarction: Secondary | ICD-10-CM

## 2019-11-08 DIAGNOSIS — Z955 Presence of coronary angioplasty implant and graft: Secondary | ICD-10-CM

## 2019-11-08 NOTE — Progress Notes (Signed)
Daily Session Note  Patient Details  Name: Cheryl Briggs MRN: [id number] Date of Birth: Jan 16, 1945 Referring Provider:     Cardiac Rehab from 09/25/2019 in [facility] Cardiac and Pulmonary Rehab  Referring Provider Kathlyn Sacramento MD      Encounter Date: 11/08/2019  Check In:  Session Check In - 11/08/19 1018      Check-In   Supervising physician immediately available to respond to emergencies See telemetry face sheet for immediately available ER MD    Location [facility]    Staff Present Alberteen Sam, MA, RCEP, CCRP, CCET;Kammy Klett RN, BSN;Joseph [place]    Virtual Visit No    Medication changes reported     No    Fall or balance concerns reported    No    Warm-up and Cool-down Performed on first and last piece of equipment    Resistance Training Performed Yes    VAD Patient? No    PAD/SET Patient? No      Pain Assessment   Currently in Pain? No/denies              Social History   Tobacco Use  Smoking Status Never Smoker  Smokeless Tobacco Never Used    Goals Met:  Proper associated with RPD/PD & O2 Sat Independence with exercise equipment Exercise tolerated well No report of cardiac concerns or symptoms Strength training completed today  Goals Unmet:  Not Applicable  Comments: Pt able to follow exercise prescription today without complaint.  Will continue to monitor for progression.   Dr. Emily Filbert is Medical Director for [place] and [name].

## 2019-11-11 ENCOUNTER — Other Ambulatory Visit: Payer: Self-pay

## 2019-11-11 ENCOUNTER — Encounter: Payer: Medicare Other | Attending: Cardiovascular Disease | Admitting: *Deleted

## 2019-11-11 DIAGNOSIS — I214 Non-ST elevation (NSTEMI) myocardial infarction: Secondary | ICD-10-CM | POA: Diagnosis not present

## 2019-11-11 DIAGNOSIS — Z955 Presence of coronary angioplasty implant and graft: Secondary | ICD-10-CM

## 2019-11-11 NOTE — Progress Notes (Signed)
Daily Session Note  Patient Details  Name: Calyssa Zobrist MRN: [id number] Date of Birth: 1945/11/01 Referring Provider:     Cardiac Rehab from 09/25/2019 in [facility] Card[facility] and Pul[facility] Rehab  Referring Provider Kathlyn Sacramento MD      Encounter Date: 11/11/2019  Check In:  Session Check In - 11/11/19 1103      Check-In   Supervising physician immediately available to respond to emergencies See telemetry face sheet for immediately available ER MD    Location [facility]    Staff Present Renita Papa, RN Moises Blood, BS, ACSM CEP, Exercise Physiologist;Joseph Lou [place], [place] Exercise Physiologist    Virtual Visit No    Medication changes reported     No    Fall or balance concerns reported    No    Warm-up and Cool-down Performed on first and last piece of equipment    Resistance Training Performed Yes    VAD Patient? No    PAD/SET Patient? No      Pain Assessment   Currently in Pain? No/denies              Social History   Tobacco Use  Smoking Status Never Smoker  Smokeless Tobacco Never Used    Goals Met:  Independence with exercise equipment Exercise tolerated well No report of cardiac concerns or symptoms Strength training completed today  Goals Unmet:  Not Applicable  Comments: Pt able to follow exercise prescription today without complaint.  Will continue to monitor for progression.    Dr. Emily Filbert is Medical Director for [place] and [name].

## 2019-11-13 ENCOUNTER — Encounter: Payer: Self-pay | Admitting: *Deleted

## 2019-11-13 ENCOUNTER — Other Ambulatory Visit: Payer: Self-pay

## 2019-11-13 DIAGNOSIS — Z955 Presence of coronary angioplasty implant and graft: Secondary | ICD-10-CM

## 2019-11-13 DIAGNOSIS — I214 Non-ST elevation (NSTEMI) myocardial infarction: Secondary | ICD-10-CM

## 2019-11-13 NOTE — Progress Notes (Signed)
Daily Session Note  Patient Details  Name: Cheryl Briggs MRN: [id number] Date of Birth: 15-Mar-1945 Referring Provider:     Cardiac Rehab from 09/25/2019 in [facility]  Referring Provider Kathlyn Sacramento MD      Encounter Date: 11/13/2019  Check In:  Session Check In - 11/13/19 1009      Check-In   Supervising physician immediately available to respond to emergencies See telemetry face sheet for immediately available ER MD    Location [facility]    Staff Present Birdie Sons, MPA, RN;Joseph Darrin Nipper, [place], RCEP, CCRP, CCET    Virtual Visit No    Medication changes reported     No    Fall or balance concerns reported    No    Warm-up and Cool-down Performed on first and last piece of equipment    Resistance Training Performed Yes    VAD Patient? No    PAD/SET Patient? No      Pain Assessment   Currently in Pain? No/denies              Social History   Tobacco Use  Smoking Status Never Smoker  Smokeless Tobacco Never Used    Goals Met:  Independence with exercise equipment Exercise tolerated well No report of cardiac concerns or symptoms Strength training completed today  Goals Unmet:  Not Applicable  Comments: Pt able to follow exercise prescription today without complaint.  Will continue to monitor for progression.    Dr. Emily Filbert is Medical Director for [place] and [name].

## 2019-11-13 NOTE — Progress Notes (Signed)
Cardiac Individual Treatment Plan  Patient Details  Name: Cheryl Briggs MRN: [id number] Date of Birth: 10/26/1945 Referring Provider:     Cardiac Rehab from 09/25/2019 in [facility] Cardiac and Pulmonary Rehab  Referring Provider Kathlyn Sacramento MD      Initial Encounter Date:    Cardiac Rehab from 09/25/2019 in [facility] Cardiac and Pulmonary Rehab  Date 09/25/19      Visit Diagnosis: NSTEMI (non-ST elevated myocardial infarction) [facility])  Status post coronary artery stent placement  Patient's Home Medications on Admission:  Current Outpatient Medications:  .  aspirin EC 81 MG EC tablet, Take 1 tablet (81 mg total) by mouth daily. Swallow whole., Disp: , Rfl:  .  atorvastatin (LIPITOR) 80 MG tablet, Take 1 tablet (80 mg total) by mouth daily., Disp: 30 tablet, Rfl: 2 .  Bioflavonoid Products (ESTER C PO), Take 1,000 mg by mouth daily., Disp: , Rfl:  .  calcium citrate-vitamin D (CITRACAL+D) 315-200 MG-UNIT tablet, Take 1 tablet by mouth 2 (two) times daily., Disp: , Rfl:  .  Chlorpheniramine Maleate (CHLOR-TABLETS PO), Take 1 tablet by mouth as needed. , Disp: , Rfl:  .  Cholecalciferol (VITAMIN D-1000 MAX ST) 25 MCG (1000 UT) tablet, Take 3,000 Units by mouth daily., Disp: , Rfl:  .  desonide (DESOWEN) 0.05 % ointment, desonide 0.05 % topical ointment, Disp: , Rfl:  .  gabapentin (NEURONTIN) 300 MG capsule, Take 300 mg by mouth 3 (three) times daily., Disp: , Rfl:  .  losartan (COZAAR) 25 MG tablet, Take 0.5 tablets (12.5 mg total) by mouth daily., Disp: 30 tablet, Rfl: 1 .  metoprolol succinate (TOPROL-XL) 50 MG 24 hr tablet, Take 1.5 tablets (75 mg total) by mouth daily., Disp: 135 tablet, Rfl: 1 .  Multiple Vitamins-Minerals (MULTIVITAMIN WITH MINERALS) tablet, Take 1 tablet by mouth daily., Disp: , Rfl:  .  Omeprazole (PRILOSEC PO), Take 1 tablet by mouth daily. , Disp: , Rfl:  .  ticagrelor (BRILINTA) 90 MG TABS tablet, Take 1 tablet (90 mg total) by mouth 2 (two) times daily., Disp:  60 tablet, Rfl: 2 .  traMADol-acetaminophen (ULTRACET) 37.5-325 MG tablet, Take 1 tablet by mouth every 6 (six) hours as needed., Disp: , Rfl:   Past Medical History: Past Medical History:  Diagnosis Date  . Allergy   . Arthritis   . 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    Tobacco Use: Social History   Tobacco Use  Smoking Status Never Smoker  Smokeless Tobacco Never Used    Labs: Recent Review Flowsheet Data    Labs for ITP Cardiac and Pulmonary Rehab Latest Ref Rng & Units 08/15/2019 09/24/2019   Cholestrol 100 - 199 mg/dL 181 139   LDLCALC 0 - 99 mg/dL 82 54   LDLDIRECT 0 - 99 mg/dL - 51   HDL >39 mg/dL 66 61   Trlycerides 0 - 149 mg/dL 164(H) 138   Hemoglobin A1c 4.8 - 5.6 % 6.0(H) -       Exercise Target Goals: Exercise Program Goal: Individual exercise prescription set using results from initial 6 min walk test and THRR while considering  patient's activity barriers and safety.   Exercise Prescription Goal: Initial exercise prescription builds to 30-45 minutes a day of aerobic activity, 2-3 days per week.  Home exercise guidelines will be given to patient during program as part of exercise prescription that the participant will acknowledge.   Education: Aerobic Exercise & Resistance Training: - Gives group verbal and written instruction on the various components  of exercise. Focuses on aerobic and resistive training programs and the benefits of this training and how to safely progress through these programs..   Cardiac Rehab from 10/30/2019 in [facility] Cardiac and Pulmonary Rehab  Date 10/30/19  Educator [facility]  Instruction Review Code 1- Verbalizes Understanding      Education: Exercise & Equipment Safety: - Individual verbal instruction and demonstration of equipment use and safety with use of the equipment.   Cardiac Rehab from 10/30/2019 in [facility] Cardiac and Pulmonary Rehab  Date 09/19/19  Educator [facility]  Instruction Review Code 1- Verbalizes Understanding      Education:  Exercise Physiology & General Exercise Guidelines: - Group verbal and written instruction with models to review the exercise physiology of the cardiovascular system and associated critical values. Provides general exercise guidelines with specific guidelines to those with heart or lung disease.    Cardiac Rehab from 10/30/2019 in [facility] Cardiac and Pulmonary Rehab  Education need identified 09/25/19  Date 10/23/19  Educator [facility]  Instruction Review Code 1- [employer] Understanding      Education: Flexibility, Balance, Mind/Body Relaxation: Provides group verbal/written instruction on the benefits of flexibility and balance training, including mind/body exercise modes such as yoga, pilates and tai chi.  Demonstration and skill practice provided.   Activity Barriers & Risk Stratification:  Activity Barriers & Cardiac Risk Stratification - 09/25/19 1159      Activity Barriers & Cardiac Risk Stratification   Activity Barriers Arthritis;Back Problems;Joint Problems;Deconditioning;Muscular Weakness;Shortness of Breath;Neck/Spine Problems;Balance Concerns;Decreased Ventricular Function;Other (comment)    Comments Bilateral shoulder surgery x2, back surgery x2, occasional hip/knee pain, arthritis all over    Cardiac Risk Stratification High           6 Minute Walk:  6 Minute Walk    Row Name 09/25/19 1159         6 Minute Walk   Phase Initial     Distance 1095 feet     Walk Time 6 minutes     # of Rest Breaks 0     MPH 2.07     METS 2.47     RPE 11     Perceived Dyspnea  1     VO2 Peak 8.64     Symptoms Yes (comment)     Comments slightly SOB at end     Resting HR 96 bpm     Resting BP 124/62     Resting Oxygen Saturation  95 %     Exercise Oxygen Saturation  during 6 min walk 96 %     Max Ex. HR 112 bpm     Max Ex. BP 154/74     2 Minute Post BP 126/64            Oxygen Initial Assessment:   Oxygen Re-Evaluation:   Oxygen Discharge (Final Oxygen  Re-Evaluation):   Initial Exercise Prescription:  Initial Exercise Prescription - 09/25/19 1200      Date of Initial Exercise RX and Referring Provider   Date 09/25/19    Referring Provider Kathlyn Sacramento MD      Treadmill   MPH 1.9    Grade 0.5    Minutes 15    METs 2.59      Recumbant Bike   Level 1    RPM 50    Watts 10    Minutes 15    METs 2.5      NuStep   Level 1    SPM 80    Minutes 15  METs 2.5      Recumbant Elliptical   Level 1    RPM 50    Minutes 15    METs 2      Prescription Details   Frequency (times per week) 2    Duration Progress to 30 minutes of continuous aerobic without signs/symptoms of physical distress      Intensity   THRR 40-80% of Max Heartrate 110-134    Ratings of Perceived Exertion 11-13    Perceived Dyspnea 0-4      Progression   Progression Continue to progress workloads to maintain intensity without signs/symptoms of physical distress.      Resistance Training   Training Prescription Yes    Weight 3 lb           Perform Capillary Blood Glucose checks as needed.  Exercise Prescription Changes:  Exercise Prescription Changes    Row Name 09/25/19 1200 10/09/19 1700 10/23/19 1000 11/07/19 0800       Response to Exercise   Blood Pressure (Admit) 124/62 118/62 132/64 122/64    Blood Pressure (Exercise) 154/74 132/64 152/80 138/74    Blood Pressure (Exit) 126/64 102/60 128/60 124/70    Heart Rate (Admit) 96 bpm 94 bpm 86 bpm 93 bpm    Heart Rate (Exercise) 112 bpm 117 bpm 130 bpm 134 bpm    Heart Rate (Exit) 87 bpm 88 bpm 105 bpm 99 bpm    Oxygen Saturation (Admit) 95 % -- -- --    Oxygen Saturation (Exercise) 96 % -- -- --    Rating of Perceived Exertion (Exercise) '11 15 15 15    ' Perceived Dyspnea (Exercise) 1 -- -- --    Symptoms SOB at end -- none none    Comments walk test results first full day of exercise -- --    Duration -- Progress to 30 minutes of  aerobic without signs/symptoms of physical distress  Continue with 30 min of aerobic exercise without signs/symptoms of physical distress. Continue with 30 min of aerobic exercise without signs/symptoms of physical distress.    Intensity -- -- THRR unchanged THRR unchanged      Progression   Progression -- Continue to progress workloads to maintain intensity without signs/symptoms of physical distress. Continue to progress workloads to maintain intensity without signs/symptoms of physical distress. Continue to progress workloads to maintain intensity without signs/symptoms of physical distress.    Average METs -- -- 3.04 2.4      Resistance Training   Training Prescription -- Yes Yes Yes    Weight -- 3 lb 3 lb 3 lb    Reps -- 10-15 10-15 10-15      Interval Training   Interval Training -- -- No No      Treadmill   MPH -- 1.9 2.3 2.5    Grade -- 0.5 0.5 0.5    Minutes -- '15 15 15    ' METs -- 2.59 2.92 3.09      Recumbant Elliptical   Level -- 1 1 --    Minutes -- 15 15 --    METs -- 1.6 1.6 --      REL-XR   Level -- -- 1 1    Minutes -- -- 15 15    METs -- -- 4.6 1.7           Exercise Comments:   Exercise Goals and Review:  Exercise Goals    Row Name 09/25/19 1204  Exercise Goals   Increase Physical Activity Yes       Intervention Provide advice, education, support and counseling about physical activity/exercise needs.;Develop an individualized exercise prescription for aerobic and resistive training based on initial evaluation findings, risk stratification, comorbidities and participant's personal goals.       Expected Outcomes Short Term: Attend rehab on a regular basis to increase amount of physical activity.;Long Term: Add in home exercise to make exercise part of routine and to increase amount of physical activity.;Long Term: Exercising regularly at least 3-5 days a week.       Increase Strength and Stamina Yes       Intervention Provide advice, education, support and counseling about physical  activity/exercise needs.;Develop an individualized exercise prescription for aerobic and resistive training based on initial evaluation findings, risk stratification, comorbidities and participant's personal goals.       Expected Outcomes Short Term: Increase workloads from initial exercise prescription for resistance, speed, and METs.;Short Term: Perform resistance training exercises routinely during rehab and add in resistance training at home;Long Term: Improve cardiorespiratory fitness, muscular endurance and strength as measured by increased METs and functional capacity (6MWT)       Able to understand and use rate of perceived exertion (RPE) scale Yes       Intervention Provide education and explanation on how to use RPE scale       Expected Outcomes Short Term: Able to use RPE daily in rehab to express subjective intensity level;Long Term:  Able to use RPE to guide intensity level when exercising independently       Able to understand and use Dyspnea scale Yes       Intervention Provide education and explanation on how to use Dyspnea scale       Expected Outcomes Short Term: Able to use Dyspnea scale daily in rehab to express subjective sense of shortness of breath during exertion;Long Term: Able to use Dyspnea scale to guide intensity level when exercising independently       Knowledge and understanding of Target Heart Rate Range (THRR) Yes       Intervention Provide education and explanation of THRR including how the numbers were predicted and where they are located for reference       Expected Outcomes Short Term: Able to state/look up THRR;Short Term: Able to use daily as guideline for intensity in rehab;Long Term: Able to use THRR to govern intensity when exercising independently       Able to check pulse independently Yes       Intervention Provide education and demonstration on how to check pulse in carotid and radial arteries.;Review the importance of being able to check your own pulse for  safety during independent exercise       Expected Outcomes Short Term: Able to explain why pulse checking is important during independent exercise;Long Term: Able to check pulse independently and accurately       Understanding of Exercise Prescription Yes       Intervention Provide education, explanation, and written materials on patient's individual exercise prescription       Expected Outcomes Short Term: Able to explain program exercise prescription;Long Term: Able to explain home exercise prescription to exercise independently              Exercise Goals Re-Evaluation :  Exercise Goals Re-Evaluation    Row Name 10/02/19 0955 10/09/19 1727 10/23/19 1008 11/07/19 0856       Exercise Goal Re-Evaluation   Exercise Goals Review  Increase Physical Activity;Able to understand and use rate of perceived exertion (RPE) scale;Knowledge and understanding of Target Heart Rate Range (THRR);Understanding of Exercise Prescription;Increase Strength and Stamina;Able to check pulse independently Increase Physical Activity;Able to understand and use rate of perceived exertion (RPE) scale;Knowledge and understanding of Target Heart Rate Range (THRR);Understanding of Exercise Prescription;Increase Strength and Stamina;Able to check pulse independently Increase Physical Activity;Increase Strength and Stamina;Understanding of Exercise Prescription Increase Physical Activity;Increase Strength and Stamina    Comments Reviewed RPE and dyspnea scales, THR and program prescription with pt today.  Pt voiced understanding and was given a copy of goals to take home. Cheryl Briggs is doing well in rehab as it has been her first week. She is adjusting well to the exercise machines. HR has been maintained well within her range. Will continue to monitor. Cheryl Briggs continues to do well in rehab.  She is up 2.3 mph on the treadmill!!  We will continue to monitor her progress. Reino Bellis attends consistently and works in [employer] range.  She is now  up to 2.5 mph on TM.    Expected Outcomes Short: Use RPE daily to regulate intensity. Long: Follow program prescription in THR. Short: Continue attending rehab regularly Long: Increase overall strength/ stamina Short: Increase spm on recumbent elliptical  Long: Continue to improve stamina Short:  continue to attend regularly Long: improve stamina and MET level           Discharge Exercise Prescription (Final Exercise Prescription Changes):  Exercise Prescription Changes - 11/07/19 0800      Response to Exercise   Blood Pressure (Admit) 122/64    Blood Pressure (Exercise) 138/74    Blood Pressure (Exit) 124/70    Heart Rate (Admit) 93 bpm    Heart Rate (Exercise) 134 bpm    Heart Rate (Exit) 99 bpm    Rating of Perceived Exertion (Exercise) 15    Symptoms none    Duration Continue with 30 min of aerobic exercise without signs/symptoms of physical distress.    Intensity THRR unchanged      Progression   Progression Continue to progress workloads to maintain intensity without signs/symptoms of physical distress.    Average METs 2.4      Resistance Training   Training Prescription Yes    Weight 3 lb    Reps 10-15      Interval Training   Interval Training No      Treadmill   MPH 2.5    Grade 0.5    Minutes 15    METs 3.09      REL-XR   Level 1    Minutes 15    METs 1.7           Nutrition:  Target Goals: Understanding of nutrition guidelines, daily intake of sodium <1574m, cholesterol <2038m calories 30% from fat and 7% or less from saturated fats, daily to have 5 or more servings of fruits and vegetables.  Education: Controlling Sodium/Reading Food Labels -Group verbal and written material supporting the discussion of sodium use in heart healthy nutrition. Review and explanation with models, verbal and written materials for utilization of the food label.   Education: General Nutrition Guidelines/Fats and Fiber: -Group instruction provided by verbal, written  material, models and posters to present the general guidelines for heart healthy nutrition. Gives an explanation and review of dietary fats and fiber.   [facility]  Date 09/25/19  Instruction Review Code 3- Needs Reinforcement  [need identified]  Biometrics:  Pre Biometrics - 09/25/19 1205      Pre Biometrics   Height 5' 1.9" (1.572 m)    Weight 150 lb 6.4 oz (68.2 kg)    BMI (Calculated) 27.61    Single Leg Stand 3.5 seconds            Nutrition Therapy Plan and Nutrition Goals:  Nutrition Therapy & Goals - 10/08/19 0916      Nutrition Therapy   Diet Heart healthy, Low Na    Protein (specify units) 55g    Fiber 25 grams    Whole Grain Foods 3 servings    Saturated Fats 12 max. grams    Fruits and Vegetables 5 servings/day    Sodium 1.5 grams      Personal Nutrition Goals   Nutrition Goal ST: include vegetables and fruit - try roasted, try daves killer bread LT: Doesn't want to have another heart event.    Comments B: toast with light margaine and applesauce and cinammon :L: half sandwich with [place] or ham or smart popcorn with almond milk S: fruit (plum, orange, watermelon) L: half sandwich, piece of chicken (mostly baked)  D: frozen diet dinner, 1/2 piece of baked chicken breast, half sandwich. whole grain bread. Discussed heart healthy changes.      Intervention Plan   Intervention Prescribe, educate and counsel regarding individualized specific dietary modifications aiming towards targeted core components such as weight, hypertension, lipid management, diabetes, heart failure and other comorbidities.;Nutrition handout(s) given to patient.    Expected Outcomes Short Term Goal: Understand basic principles of dietary content, such as calories, fat, sodium, cholesterol and nutrients.;Short Term Goal: A plan has been developed with personal nutrition goals set during dietitian appointment.;Long Term Goal: Adherence to  prescribed nutrition plan.           Nutrition Assessments:  Nutrition Assessments - 09/25/19 1206      MEDFICTS Scores   Pre Score 32           MEDIFICTS Score Key:          ?70 Need to make dietary changes          40-70 Heart Healthy Diet         ? 40 Therapeutic Level Cholesterol Diet  Nutrition Goals Re-Evaluation:   Nutrition Goals Discharge (Final Nutrition Goals Re-Evaluation):   Psychosocial: Target Goals: Acknowledge presence or absence of significant depression and/or stress, maximize coping skills, provide positive support system. Participant is able to verbalize types and ability to use techniques and skills needed for reducing stress and depression.   Education: Depression - Provides group verbal and written instruction on the correlation between heart/lung disease and depressed mood, treatment options, and the stigmas associated with seeking treatment.   Cardiac Rehab from 10/30/2019 in [facility] Cardiac and Pulmonary Rehab  Date 10/16/19  Educator [facility]  Instruction Review Code 1- Verbalizes Understanding      Education: Sleep Hygiene -Provides group verbal and written instruction about how sleep can affect your health.  Define sleep hygiene, discuss sleep cycles and impact of sleep habits. Review good sleep hygiene tips.     Education: Stress and Anxiety: - Provides group verbal and written instruction about the health risks of elevated stress and causes of high stress.  Discuss the correlation between heart/lung disease and anxiety and treatment options. Review healthy ways to manage with stress and anxiety.   Cardiac Rehab from 10/30/2019 in [facility] Cardiac and Pulmonary Rehab  Date 10/16/19  Educator [facility]  Instruction Review Code 1- Verbalizes Understanding       Initial Review & Psychosocial Screening:  Initial Psych Review & Screening - 09/19/19 1038      Initial Review   Current issues with None Identified      Family Dynamics   Good Support  System? Yes    Comments She can look to her daughter for support with whome she lives with.She has a positive outlook on her health and overall mental state.      Barriers   Psychosocial barriers to participate in program The patient should benefit from training in stress management and relaxation.;There are no identifiable barriers or psychosocial needs.      Screening Interventions   Interventions Encouraged to exercise;To provide support and resources with identified psychosocial needs;Provide feedback about the scores to participant    Expected Outcomes Short Term goal: Utilizing psychosocial counselor, staff and physician to assist with identification of specific Stressors or current issues interfering with healing process. Setting desired goal for each stressor or current issue identified.;Long Term Goal: Stressors or current issues are controlled or eliminated.;Short Term goal: Identification and review with participant of any Quality of Life or Depression concerns found by scoring the questionnaire.;Long Term goal: The participant improves quality of Life and PHQ9 Scores as seen by post scores and/or verbalization of changes           Quality of Life Scores:   Quality of Life - 09/25/19 1205      Quality of Life   Select Quality of Life      Quality of Life Scores   Health/Function Pre 27.67 %    Socioeconomic Pre 30 %    Psych/Spiritual Pre 30 %    Family Pre 27 %    GLOBAL Pre 28.53 %          Scores of 19 and below usually indicate a poorer quality of life in these areas.  A difference of  2-3 points is a clinically meaningful difference.  A difference of 2-3 points in the total score of the Quality of Life Index has been associated with significant improvement in overall quality of life, self-image, physical symptoms, and general health in studies assessing change in quality of life.  PHQ-9: Recent Review Flowsheet Data    Depression screen [facility] 2/9 09/25/2019   Decreased  Interest 0   Down, Depressed, Hopeless 0   PHQ - 2 Score 0   Altered sleeping 0   Tired, decreased energy 1   Change in appetite 1   Feeling bad or failure about yourself  0   Trouble concentrating 0   Moving slowly or fidgety/restless 0   Suicidal thoughts 0   PHQ-9 Score 2   Difficult doing work/chores Not difficult at all     Interpretation of Total Score  Total Score Depression Severity:  1-4 = Minimal depression, 5-9 = Mild depression, 10-14 = Moderate depression, 15-19 = Moderately severe depression, 20-27 = Severe depression   Psychosocial Evaluation and Intervention:  Psychosocial Evaluation - 09/19/19 1040      Psychosocial Evaluation & Interventions   Interventions Encouraged to exercise with the program and follow exercise prescription    Comments She can look to her daughter for support with whome she lives with.She has a positive outlook on her health and overall mental state.    Expected Outcomes Short: Exercise regularly to support mental health and notify staff of any changes. Long: maintain mental health and well being through teaching  of rehab or prescribed medications independently.    Continue Psychosocial Services  Follow up required by counselor           Psychosocial Re-Evaluation:   Psychosocial Discharge (Final Psychosocial Re-Evaluation):   Vocational Rehabilitation: Provide vocational rehab assistance to qualifying candidates.   Vocational Rehab Evaluation & Intervention:   Education: Education Goals: Education classes will be provided on a variety of topics geared toward better understanding of heart health and risk factor modification. Participant will state understanding/return demonstration of topics presented as noted by education test scores.  Learning Barriers/Preferences:  Learning Barriers/Preferences - 09/19/19 1037      Learning Barriers/Preferences   Learning Barriers None    Learning Preferences None           General  Cardiac Education Topics:  AED/CPR: - Group verbal and written instruction with the use of models to demonstrate the basic use of the AED with the basic ABC's of resuscitation.   Anatomy & Physiology of the Heart: - Group verbal and written instruction and models provide basic cardiac anatomy and physiology, with the coronary electrical and arterial systems. Review of Valvular disease and Heart Failure   Cardiac Rehab from 10/30/2019 in [facility] Cardiac and Pulmonary Rehab  Date 09/25/19  Instruction Review Code 3- Needs Reinforcement  [need identified]      Cardiac Procedures: - Group verbal and written instruction to review commonly prescribed medications for heart disease. Reviews the medication, class of the drug, and side effects. Includes the steps to properly store meds and maintain the prescription regimen. (beta blockers and nitrates)   Cardiac Medications I: - Group verbal and written instruction to review commonly prescribed medications for heart disease. Reviews the medication, class of the drug, and side effects. Includes the steps to properly store meds and maintain the prescription regimen.   Cardiac Medications II: -Group verbal and written instruction to review commonly prescribed medications for heart disease. Reviews the medication, class of the drug, and side effects. (all other drug classes)    Go Sex-Intimacy & Heart Disease, Get SMART - Goal Setting: - Group verbal and written instruction through game format to discuss heart disease and the return to sexual intimacy. Provides group verbal and written material to discuss and apply goal setting through the application of the S.M.A.R.T. Method.   Other Matters of the Heart: - Provides group verbal, written materials and models to describe Stable Angina and Peripheral Artery. Includes description of the disease process and treatment options available to the cardiac patient.   Infection Prevention: - Provides verbal  and written material to individual with discussion of infection control including proper hand washing and proper equipment cleaning during exercise session.   Cardiac Rehab from 10/30/2019 in [facility] Cardiac and Pulmonary Rehab  Date 09/19/19  Educator [facility]  Instruction Review Code 1- Verbalizes Understanding      Falls Prevention: - Provides verbal and written material to individual with discussion of falls prevention and safety.   Cardiac Rehab from 10/30/2019 in [facility] Cardiac and Pulmonary Rehab  Date 09/19/19  Educator [facility]  Instruction Review Code 1- Verbalizes Understanding      Other: -Provides group and verbal instruction on various topics (see comments)   Knowledge Questionnaire Score:  Knowledge Questionnaire Score - 09/25/19 1206      Knowledge Questionnaire Score   Pre Score 23/26 Education Focus: Exercise, nutrition, angina           Core Components/Risk Factors/Patient Goals at Admission:  Personal Goals and Risk  Factors at Admission - 09/25/19 1206      Core Components/Risk Factors/Patient Goals on Admission    Weight Management Yes;Weight Loss    Intervention Weight Management: Develop a combined nutrition and exercise program designed to reach desired caloric intake, while maintaining appropriate intake of nutrient and fiber, sodium and fats, and appropriate energy expenditure required for the weight goal.;Weight Management: Provide education and appropriate resources to help participant work on and attain dietary goals.    Admit Weight 150 lb 6.4 oz (68.2 kg)    Goal Weight: Short Term 145 lb (65.8 kg)    Goal Weight: Long Term 140 lb (63.5 kg)    Expected Outcomes Short Term: Continue to assess and modify interventions until short term weight is achieved;Long Term: Adherence to nutrition and physical activity/exercise program aimed toward attainment of established weight goal;Understanding recommendations for meals to include 15-35% energy as protein, 25-35% energy  from fat, 35-60% energy from carbohydrates, less than 241m of dietary cholesterol, 20-35 gm of total fiber daily;Understanding of distribution of calorie intake throughout the day with the consumption of 4-5 meals/snacks;Weight Loss: Understanding of general recommendations for a balanced deficit meal plan, which promotes 1-2 lb weight loss per week and includes a negative energy balance of [phone] kcal/d    Heart Failure Yes    Intervention Provide a combined exercise and nutrition program that is supplemented with education, support and counseling about heart failure. Directed toward relieving symptoms such as shortness of breath, decreased exercise tolerance, and extremity edema.    Expected Outcomes Improve functional capacity of life;Short term: Attendance in program 2-3 days a week with increased exercise capacity. Reported lower sodium intake. Reported increased fruit and vegetable intake. Reports medication compliance.;Short term: Daily weights obtained and reported for increase. Utilizing diuretic protocols set by physician.;Long term: Adoption of self-care skills and reduction of barriers for early signs and symptoms recognition and intervention leading to self-care maintenance.    Hypertension Yes    Intervention Provide education on lifestyle modifcations including regular physical activity/exercise, weight management, moderate sodium restriction and increased consumption of fresh fruit, vegetables, and low fat dairy, alcohol moderation, and smoking cessation.;Monitor prescription use compliance.    Expected Outcomes Long Term: Maintenance of blood pressure at goal levels.;Short Term: Continued assessment and intervention until BP is < 140/951mHG in hypertensive participants. < 130/8050mG in hypertensive participants with diabetes, heart failure or chronic kidney disease.    Lipids Yes    Intervention Provide education and support for participant on nutrition & aerobic/resistive exercise  along with prescribed medications to achieve LDL <16m38mDL >40mg16m Expected Outcomes Short Term: Participant states understanding of desired cholesterol values and is compliant with medications prescribed. Participant is following exercise prescription and nutrition guidelines.;Long Term: Cholesterol controlled with medications as prescribed, with individualized exercise RX and with personalized nutrition plan. Value goals: LDL < 16mg,90m > 40 mg.           Education:Diabetes - Individual verbal and written instruction to review signs/symptoms of diabetes, desired ranges of glucose level fasting, after meals and with exercise. Acknowledge that pre and post exercise glucose checks will be done for 3 sessions at entry of program.   Education: Know Your Numbers and Risk Factors: -Group verbal and written instruction about important numbers in your health.  Discussion of what are risk factors and how they play a role in the disease process.  Review of Cholesterol, Blood Pressure, Diabetes, and BMI and the role they play  in your overall health.   Core Components/Risk Factors/Patient Goals Review:    Core Components/Risk Factors/Patient Goals at Discharge (Final Review):    ITP Comments:  ITP Comments    Row Name 09/19/19 1036 09/25/19 1159 10/02/19 0955 10/08/19 1006 10/16/19 0554   ITP Comments Virtual Visit completed. Patient informed on EP and RD appointment and 6 Minute walk test. Patient also informed of patient health questionnaires on My Chart. Patient Verbalizes understanding. Visit diagnosis can be found in [facility] 08/15/2019. Completed 6MWT and gym orientation. Initial ITP created and sent for review to Dr. Emily Filbert, Medical Director. First full day of exercise!  Patient was oriented to gym and equipment including functions, settings, policies, and procedures.  Patient's individual exercise prescription and treatment plan were reviewed.  All starting workloads were established based on  the results of the 6 minute walk test done at initial orientation visit.  The plan for exercise progression was also introduced and progression will be customized based on patient's performance and goals. Completed Initial RD Evaluation 30 Day review completed. Medical Director ITP review done, changes made as directed, and signed approval by Medical Director.   [place] Name 11/13/19 0652           ITP Comments 30 Day review completed. Medical Director ITP review done, changes made as directed, and signed approval by Medical Director.              Comments:

## 2019-11-15 ENCOUNTER — Other Ambulatory Visit: Payer: Self-pay

## 2019-11-15 ENCOUNTER — Encounter: Payer: Medicare Other | Admitting: *Deleted

## 2019-11-15 DIAGNOSIS — Z955 Presence of coronary angioplasty implant and graft: Secondary | ICD-10-CM

## 2019-11-15 DIAGNOSIS — I214 Non-ST elevation (NSTEMI) myocardial infarction: Secondary | ICD-10-CM

## 2019-11-15 NOTE — Progress Notes (Signed)
Daily Session Note  Patient Details  Name: Cheryl Briggs MRN: [id number] Date of Birth: 20-Aug-1945 Referring Provider:     Cardiac Rehab from 09/25/2019 in [facility] Cardiac and Pulmonary Rehab  Referring Provider Kathlyn Sacramento MD      Encounter Date: 11/15/2019  Check In:  Session Check In - 11/15/19 1013      Check-In   Supervising physician immediately available to respond to emergencies See telemetry face sheet for immediately available ER MD    Location [facility]-Cardiac & Pulmonary Rehab    Staff Present Heath Lark, RN, BSN, CCRP;Meredith Sherryll Burger, RN BSN;Jessica [place], MA, RCEP, CCRP, CCET;Joseph [place] RCP,RRT,BSRT    Virtual Visit No    Medication changes reported     No    Fall or balance concerns reported    No    Warm-up and Cool-down Performed on first and last piece of equipment    Resistance Training Performed Yes    VAD Patient? No    PAD/SET Patient? No      Pain Assessment   Currently in Pain? No/denies              Social History   Tobacco Use  Smoking Status Never Smoker  Smokeless Tobacco Never Used    Goals Met:  Independence with exercise equipment Exercise tolerated well No report of cardiac concerns or symptoms  Goals Unmet:  Not Applicable  Comments: Pt able to follow exercise prescription today without complaint.  Will continue to monitor for progression.    Dr. Emily Filbert is Medical Director for [place] and [name].

## 2019-11-18 ENCOUNTER — Encounter: Payer: Medicare Other | Admitting: *Deleted

## 2019-11-18 ENCOUNTER — Other Ambulatory Visit: Payer: Self-pay

## 2019-11-18 DIAGNOSIS — I214 Non-ST elevation (NSTEMI) myocardial infarction: Secondary | ICD-10-CM | POA: Diagnosis not present

## 2019-11-18 DIAGNOSIS — Z955 Presence of coronary angioplasty implant and graft: Secondary | ICD-10-CM

## 2019-11-18 NOTE — Progress Notes (Signed)
Daily Session Note  Patient Details  Name: Aleksa Collinsworth MRN: [id number] Date of Birth: 04/05/1945 Referring Provider:     Cardiac Rehab from 09/25/2019 in [facility] Cardiac and Pulmonary Rehab  Referring Provider Kathlyn Sacramento MD      Encounter Date: 11/18/2019  Check In:  Session Check In - 11/18/19 1046      Check-In   Supervising physician immediately available to respond to emergencies See telemetry face sheet for immediately available ER MD    Location [facility]-Cardiac & Pulmonary Rehab    Staff Present Heath Lark, RN, BSN, Jacklynn Bue, MS Exercise Physiologist;Joseph Tedd Sias, [place], ACSM CEP, Exercise Physiologist    Virtual Visit No    Medication changes reported     No    Fall or balance concerns reported    No    Warm-up and Cool-down Performed on first and last piece of equipment    Resistance Training Performed Yes    VAD Patient? No    PAD/SET Patient? No      Pain Assessment   Currently in Pain? No/denies              Social History   Tobacco Use  Smoking Status Never Smoker  Smokeless Tobacco Never Used    Goals Met:  Independence with exercise equipment Exercise tolerated well No report of cardiac concerns or symptoms Strength training completed today  Goals Unmet:  Not Applicable  Comments: Reviewed home exercise with pt today.  Pt plans to walk for exercise.  Reviewed THR, pulse, RPE, sign and symptoms, pulse oximetery and when to call 911 or MD.  Also discussed weather considerations and indoor options.  Pt voiced understanding.    Dr. Emily Filbert is Medical Director for [place] and [name].

## 2019-11-20 ENCOUNTER — Other Ambulatory Visit: Payer: Self-pay

## 2019-11-20 DIAGNOSIS — Z955 Presence of coronary angioplasty implant and graft: Secondary | ICD-10-CM

## 2019-11-20 DIAGNOSIS — I214 Non-ST elevation (NSTEMI) myocardial infarction: Secondary | ICD-10-CM

## 2019-11-20 NOTE — Progress Notes (Signed)
Daily Session Note  Patient Details  Name: Cheryl Briggs MRN: [id number] Date of Birth: 1945/05/04 Referring Provider:     Cardiac Rehab from 09/25/2019 in [facility] Cardiac and Pulmonary Rehab  Referring Provider Kathlyn Sacramento MD      Encounter Date: 11/20/2019  Check In:  Session Check In - 11/20/19 1002      Check-In   Supervising physician immediately available to respond to emergencies See telemetry face sheet for immediately available ER MD    Location [facility]-Cardiac & Pulmonary Rehab    Staff Present Birdie Sons, MPA, RN;Amanda Sommer, BA, ACSM CEP, Exercise Physiologist;Jessica Luan Pulling, MA, RCEP, CCRP, CCET    Virtual Visit No    Medication changes reported     No    Fall or balance concerns reported    No    Warm-up and Cool-down Performed on first and last piece of equipment    Resistance Training Performed Yes    VAD Patient? No    PAD/SET Patient? No      Pain Assessment   Currently in Pain? No/denies              Social History   Tobacco Use  Smoking Status Never Smoker  Smokeless Tobacco Never Used    Goals Met:  Independence with exercise equipment Exercise tolerated well No report of cardiac concerns or symptoms Strength training completed today  Goals Unmet:  Not Applicable  Comments: Pt able to follow exercise prescription today without complaint.  Will continue to monitor for progression.    Dr. Emily Filbert is Medical Director for [place] and [name].

## 2019-11-22 ENCOUNTER — Other Ambulatory Visit: Payer: Self-pay

## 2019-11-22 ENCOUNTER — Encounter: Payer: Medicare Other | Admitting: *Deleted

## 2019-11-22 DIAGNOSIS — I214 Non-ST elevation (NSTEMI) myocardial infarction: Secondary | ICD-10-CM | POA: Diagnosis not present

## 2019-11-22 DIAGNOSIS — Z955 Presence of coronary angioplasty implant and graft: Secondary | ICD-10-CM

## 2019-11-22 NOTE — Progress Notes (Signed)
Daily Session Note  Patient Details  Name: Cheryl Briggs MRN: [id number] Date of Birth: 02-Feb-1945 Referring Provider:     Cardiac Rehab from 09/25/2019 in [facility] Cardiac and Pulmonary Rehab  Referring Provider Cheryl Sacramento MD      Encounter Date: 11/22/2019  Check In:  Session Check In - 11/22/19 1023      Check-In   Supervising physician immediately available to respond to emergencies See telemetry face sheet for immediately available ER MD    Location [facility]-Cardiac & Pulmonary Rehab    Staff Present Cheryl Papa, RN BSN;Cheryl Briggs;Cheryl Lark, RN, BSN, CCRP;Cheryl [place], MA, RCEP, CCRP, CCET    Virtual Visit No    Medication changes reported     No    Fall or balance concerns reported    No    Warm-up and Cool-down Performed on first and last piece of equipment    Resistance Training Performed Yes    VAD Patient? No    PAD/SET Patient? No      Pain Assessment   Currently in Pain? No/denies              Social History   Tobacco Use  Smoking Status Never Smoker  Smokeless Tobacco Never Used    Goals Met:  Independence with exercise equipment Exercise tolerated well No report of cardiac concerns or symptoms Strength training completed today  Goals Unmet:  Not Applicable  Comments: Pt able to follow exercise prescription today without complaint.  Will continue to monitor for progression.    Dr. Emily Briggs is Medical Director for [name] and [name].

## 2019-11-25 ENCOUNTER — Other Ambulatory Visit: Payer: Self-pay

## 2019-11-25 ENCOUNTER — Encounter: Payer: Medicare Other | Admitting: *Deleted

## 2019-11-25 DIAGNOSIS — I214 Non-ST elevation (NSTEMI) myocardial infarction: Secondary | ICD-10-CM | POA: Diagnosis not present

## 2019-11-25 NOTE — Progress Notes (Signed)
Daily Session Note  Patient Details  Name: Brynne Doane MRN: [id number] Date of Birth: [birth year] Referring Provider:     Cardiac Rehab from 09/25/2019 in [facility] Cardiac and Pulmonary Rehab  Referring Provider Kathlyn Sacramento MD      Encounter Date: 11/25/2019  Check In:  Session Check In - 11/25/19 1039      Check-In   Supervising physician immediately available to respond to emergencies See telemetry face sheet for immediately available ER MD    Location [facility]-Cardiac & Pulmonary Rehab    Staff Present Renita Papa, RN BSN;Joseph [address] [place], [place], ACSM CEP, Exercise Physiologist    Virtual Visit No    Medication changes reported     No    Fall or balance concerns reported    No    Warm-up and Cool-down Performed on first and last piece of equipment    Resistance Training Performed Yes    VAD Patient? No    PAD/SET Patient? No      Pain Assessment   Currently in Pain? No/denies              Social History   Tobacco Use  Smoking Status Never Smoker  Smokeless Tobacco Never Used    Goals Met:  Independence with exercise equipment Exercise tolerated well No report of cardiac concerns or symptoms Strength training completed today  Goals Unmet:  Not Applicable  Comments: Pt able to follow exercise prescription today without complaint.  Will continue to monitor for progression.    Dr. Emily Filbert is Medical Director for [place] and [name].

## 2019-12-02 ENCOUNTER — Other Ambulatory Visit: Payer: Self-pay

## 2019-12-02 ENCOUNTER — Encounter: Payer: Medicare Other | Admitting: *Deleted

## 2019-12-02 DIAGNOSIS — I214 Non-ST elevation (NSTEMI) myocardial infarction: Secondary | ICD-10-CM

## 2019-12-02 NOTE — Progress Notes (Signed)
Daily Session Note  Patient Details  Name: Trinaty Bundrick MRN: [id number] Date of Birth: [birth year] Referring Provider:     Cardiac Rehab from 09/25/2019 in [facility] Cardiac and Pulmonary Rehab  Referring Provider Kathlyn Sacramento MD      Encounter Date: 12/02/2019  Check In:  Session Check In - 12/02/19 0951      Check-In   Supervising physician immediately available to respond to emergencies See telemetry face sheet for immediately available ER MD    Location [facility]-Cardiac & Pulmonary Rehab    Staff Present Heath Lark, RN, BSN, Laveda Norman, BS, ACSM CEP, Exercise Physiologist;Joseph Tessie Fass RCP,RRT,BSRT    Virtual Visit No    Medication changes reported     No    Fall or balance concerns reported    No    Warm-up and Cool-down Performed on first and last piece of equipment    Resistance Training Performed No    VAD Patient? No    PAD/SET Patient? No      Pain Assessment   Currently in Pain? No/denies              Social History   Tobacco Use  Smoking Status Never Smoker  Smokeless Tobacco Never Used    Goals Met:  Independence with exercise equipment Exercise tolerated well No report of cardiac concerns or symptoms  Goals Unmet:  Not Applicable  Comments: Pt able to follow exercise prescription today without complaint.  Will continue to monitor for progression.    Dr. Emily Filbert is Medical Director for [place] and [name].

## 2019-12-04 ENCOUNTER — Other Ambulatory Visit: Payer: Self-pay

## 2019-12-04 DIAGNOSIS — I214 Non-ST elevation (NSTEMI) myocardial infarction: Secondary | ICD-10-CM

## 2019-12-04 DIAGNOSIS — Z955 Presence of coronary angioplasty implant and graft: Secondary | ICD-10-CM

## 2019-12-04 NOTE — Progress Notes (Signed)
Daily Session Note  Patient Details  Name: Corretta Munce MRN: [id number] Date of Birth: 01-Dec-1945 Referring Provider:     Cardiac Rehab from 09/25/2019 in [facility] Cardiac and Pulmonary Rehab  Referring Provider Kathlyn Sacramento MD      Encounter Date: 12/04/2019  Check In:  Session Check In - 12/04/19 1014      Check-In   Supervising physician immediately available to respond to emergencies See telemetry face sheet for immediately available ER MD    Location [facility]-Cardiac & Pulmonary Rehab    Staff Present Birdie Sons, MPA, Elveria Rising, BA, ACSM CEP, Exercise Physiologist;Kara Eliezer Bottom, MS Exercise Physiologist    Virtual Visit No    Medication changes reported     No    Fall or balance concerns reported    No    Warm-up and Cool-down Performed on first and last piece of equipment    Resistance Training Performed Yes    VAD Patient? No    PAD/SET Patient? No      Pain Assessment   Currently in Pain? No/denies              Social History   Tobacco Use  Smoking Status Never Smoker  Smokeless Tobacco Never Used    Goals Met:  Independence with exercise equipment Exercise tolerated well No report of cardiac concerns or symptoms Strength training completed today  Goals Unmet:  Not Applicable  Comments: Pt able to follow exercise prescription today without complaint.  Will continue to monitor for progression.    Dr. Emily Filbert is Medical Director for [place] and [name].

## 2019-12-09 ENCOUNTER — Other Ambulatory Visit: Payer: Self-pay

## 2019-12-09 DIAGNOSIS — Z955 Presence of coronary angioplasty implant and graft: Secondary | ICD-10-CM

## 2019-12-09 DIAGNOSIS — I214 Non-ST elevation (NSTEMI) myocardial infarction: Secondary | ICD-10-CM | POA: Diagnosis not present

## 2019-12-09 NOTE — Progress Notes (Signed)
Daily Session Note  Patient Details  Name: Cheryl Briggs MRN: [id number] Date of Birth: 1945/02/19 Referring Provider:     Cardiac Rehab from 09/25/2019 in [facility] Cardiac and Pulmonary Rehab  Referring Provider Kathlyn Sacramento MD      Encounter Date: 12/09/2019  Check In:  Session Check In - 12/09/19 0950      Check-In   Supervising physician immediately available to respond to emergencies See telemetry face sheet for immediately available ER MD    Location [facility]-Cardiac & Pulmonary Rehab    Staff Present Birdie Sons, MPA, Mauricia Area, BS, ACSM CEP, Exercise Physiologist;Joseph Tessie Fass RCP,RRT,BSRT    Virtual Visit No    Medication changes reported     No    Fall or balance concerns reported    No    Warm-up and Cool-down Performed on first and last piece of equipment    Resistance Training Performed Yes    VAD Patient? No    PAD/SET Patient? No      Pain Assessment   Currently in Pain? No/denies              Social History   Tobacco Use  Smoking Status Never Smoker  Smokeless Tobacco Never Used    Goals Met:  Independence with exercise equipment Exercise tolerated well No report of cardiac concerns or symptoms Strength training completed today  Goals Unmet:  Not Applicable  Comments: Pt able to follow exercise prescription today without complaint.  Will continue to monitor for progression.    Dr. Emily Filbert is Medical Director for [place] and [name].

## 2019-12-11 ENCOUNTER — Encounter: Payer: Self-pay | Admitting: *Deleted

## 2019-12-11 DIAGNOSIS — I214 Non-ST elevation (NSTEMI) myocardial infarction: Secondary | ICD-10-CM

## 2019-12-11 NOTE — Progress Notes (Signed)
Cardiac Individual Treatment Plan  Patient Details  Name: Cheryl Briggs MRN: [id number] Date of Birth: 11-01-1945 Referring Provider:     Cardiac Rehab from 09/25/2019 in [facility] Cardiac and Pulmonary Rehab  Referring Provider Kathlyn Sacramento MD      Initial Encounter Date:    Cardiac Rehab from 09/25/2019 in [facility] Cardiac and Pulmonary Rehab  Date 09/25/19      Visit Diagnosis: NSTEMI (non-ST elevated myocardial infarction) [facility])  Patient's Home Medications on Admission:  Current Outpatient Medications:  .  aspirin EC 81 MG EC tablet, Take 1 tablet (81 mg total) by mouth daily. Swallow whole., Disp: , Rfl:  .  atorvastatin (LIPITOR) 80 MG tablet, Take 1 tablet (80 mg total) by mouth daily., Disp: 30 tablet, Rfl: 2 .  Bioflavonoid Products (ESTER C PO), Take 1,000 mg by mouth daily., Disp: , Rfl:  .  calcium citrate-vitamin D (CITRACAL+D) 315-200 MG-UNIT tablet, Take 1 tablet by mouth 2 (two) times daily., Disp: , Rfl:  .  Chlorpheniramine Maleate (CHLOR-TABLETS PO), Take 1 tablet by mouth as needed. , Disp: , Rfl:  .  Cholecalciferol (VITAMIN D-1000 MAX ST) 25 MCG (1000 UT) tablet, Take 3,000 Units by mouth daily., Disp: , Rfl:  .  desonide (DESOWEN) 0.05 % ointment, desonide 0.05 % topical ointment, Disp: , Rfl:  .  gabapentin (NEURONTIN) 300 MG capsule, Take 300 mg by mouth 3 (three) times daily., Disp: , Rfl:  .  losartan (COZAAR) 25 MG tablet, Take 0.5 tablets (12.5 mg total) by mouth daily., Disp: 30 tablet, Rfl: 1 .  metoprolol succinate (TOPROL-XL) 50 MG 24 hr tablet, Take 1.5 tablets (75 mg total) by mouth daily., Disp: 135 tablet, Rfl: 1 .  Multiple Vitamins-Minerals (MULTIVITAMIN WITH MINERALS) tablet, Take 1 tablet by mouth daily., Disp: , Rfl:  .  Omeprazole (PRILOSEC PO), Take 1 tablet by mouth daily. , Disp: , Rfl:  .  ticagrelor (BRILINTA) 90 MG TABS tablet, Take 1 tablet (90 mg total) by mouth 2 (two) times daily., Disp: 60 tablet, Rfl: 2 .  traMADol-acetaminophen  (ULTRACET) 37.5-325 MG tablet, Take 1 tablet by mouth every 6 (six) hours as needed., Disp: , Rfl:   Past Medical History: Past Medical History:  Diagnosis Date  . Allergy   . Arthritis   . 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    Tobacco Use: Social History   Tobacco Use  Smoking Status Never Smoker  Smokeless Tobacco Never Used    Labs: Recent Review Flowsheet Data    Labs for ITP Cardiac and Pulmonary Rehab Latest Ref Rng & Units 08/15/2019 09/24/2019   Cholestrol 100 - 199 mg/dL 181 139   LDLCALC 0 - 99 mg/dL 82 54   LDLDIRECT 0 - 99 mg/dL - 51   HDL >39 mg/dL 66 61   Trlycerides 0 - 149 mg/dL 164(H) 138   Hemoglobin A1c 4.8 - 5.6 % 6.0(H) -       Exercise Target Goals: Exercise Program Goal: Individual exercise prescription set using results from initial 6 min walk test and THRR while considering  patient's activity barriers and safety.   Exercise Prescription Goal: Initial exercise prescription builds to 30-45 minutes a day of aerobic activity, 2-3 days per week.  Home exercise guidelines will be given to patient during program as part of exercise prescription that the participant will acknowledge.   Education: Aerobic Exercise: - Group verbal and visual presentation on the components of exercise prescription. Introduces F.I.T.T principle from ACSM for exercise prescriptions.  Reviews F.I.T.T. principles of aerobic exercise including progression. Written material given at graduation.   Cardiac Rehab from 12/04/2019 in [facility] Cardiac and Pulmonary Rehab  Date 10/30/19  Educator [facility]  Instruction Review Code 1- Verbalizes Understanding      Education: Resistance Exercise: - Group verbal and visual presentation on the components of exercise prescription. Introduces F.I.T.T principle from ACSM for exercise prescriptions  Reviews F.I.T.T. principles of resistance exercise including progression. Written material given at graduation.    Education: Exercise & Equipment Safety: - Individual  verbal instruction and demonstration of equipment use and safety with use of the equipment.   Cardiac Rehab from 12/04/2019 in [facility] Cardiac and Pulmonary Rehab  Date 09/19/19  Educator [facility]  Instruction Review Code 1- Verbalizes Understanding      Education: Exercise Physiology & General Exercise Guidelines: - Group verbal and written instruction with models to review the exercise physiology of the cardiovascular system and associated critical values. Provides general exercise guidelines with specific guidelines to those with heart or lung disease.    Cardiac Rehab from 12/04/2019 in [facility] Cardiac and Pulmonary Rehab  Education need identified 09/25/19  Date 10/23/19  Educator [facility]  Instruction Review Code 1- [employer] Understanding      Education: Flexibility, Balance, Mind/Body Relaxation: - Group verbal and visual presentation with interactive activity on the components of exercise prescription. Introduces F.I.T.T principle from ACSM for exercise prescriptions. Reviews F.I.T.T. principles of flexibility and balance exercise training including progression. Also discusses the mind body connection.  Reviews various relaxation techniques to help reduce and manage stress (i.e. Deep breathing, progressive muscle relaxation, and visualization). Balance handout provided to take home. Written material given at graduation.   Cardiac Rehab from 12/04/2019 in [facility] Cardiac and Pulmonary Rehab  Date 11/13/19  Educator [facility]  Instruction Review Code 1- Verbalizes Understanding      Activity Barriers & Risk Stratification:  Activity Barriers & Cardiac Risk Stratification - 09/25/19 1159      Activity Barriers & Cardiac Risk Stratification   Activity Barriers Arthritis;Back Problems;Joint Problems;Deconditioning;Muscular Weakness;Shortness of Breath;Neck/Spine Problems;Balance Concerns;Decreased Ventricular Function;Other (comment)    Comments Bilateral shoulder surgery x2, back surgery x2, occasional  hip/knee pain, arthritis all over    Cardiac Risk Stratification High           6 Minute Walk:  6 Minute Walk    Row Name 09/25/19 1159         6 Minute Walk   Phase Initial     Distance 1095 feet     Walk Time 6 minutes     # of Rest Breaks 0     MPH 2.07     METS 2.47     RPE 11     Perceived Dyspnea  1     VO2 Peak 8.64     Symptoms Yes (comment)     Comments slightly SOB at end     Resting HR 96 bpm     Resting BP 124/62     Resting Oxygen Saturation  95 %     Exercise Oxygen Saturation  during 6 min walk 96 %     Max Ex. HR 112 bpm     Max Ex. BP 154/74     2 Minute Post BP 126/64            Oxygen Initial Assessment:   Oxygen Re-Evaluation:   Oxygen Discharge (Final Oxygen Re-Evaluation):   Initial Exercise Prescription:  Initial Exercise Prescription - 09/25/19 1200  Date of Initial Exercise RX and Referring Provider   Date 09/25/19    Referring Provider Kathlyn Sacramento MD      Treadmill   MPH 1.9    Grade 0.5    Minutes 15    METs 2.59      Recumbant Bike   Level 1    RPM 50    Watts 10    Minutes 15    METs 2.5      NuStep   Level 1    SPM 80    Minutes 15    METs 2.5      Recumbant Elliptical   Level 1    RPM 50    Minutes 15    METs 2      Prescription Details   Frequency (times per week) 2    Duration Progress to 30 minutes of continuous aerobic without signs/symptoms of physical distress      Intensity   THRR 40-80% of Max Heartrate 110-134    Ratings of Perceived Exertion 11-13    Perceived Dyspnea 0-4      Progression   Progression Continue to progress workloads to maintain intensity without signs/symptoms of physical distress.      Resistance Training   Training Prescription Yes    Weight 3 lb           Perform Capillary Blood Glucose checks as needed.  Exercise Prescription Changes:  Exercise Prescription Changes    Row Name 09/25/19 1200 10/09/19 1700 10/23/19 1000 11/07/19 0800 11/20/19 1500       Response to Exercise   Blood Pressure (Admit) 124/62 118/62 132/64 122/64 138/64   Blood Pressure (Exercise) 154/74 132/64 152/80 138/74 168/78   Blood Pressure (Exit) 126/64 102/60 128/60 124/70 124/68   Heart Rate (Admit) 96 bpm 94 bpm 86 bpm 93 bpm 100 bpm   Heart Rate (Exercise) 112 bpm 117 bpm 130 bpm 134 bpm 133 bpm   Heart Rate (Exit) 87 bpm 88 bpm 105 bpm 99 bpm 105 bpm   Oxygen Saturation (Admit) 95 % -- -- -- --   Oxygen Saturation (Exercise) 96 % -- -- -- --   Rating of Perceived Exertion (Exercise) '11 15 15 15 14   ' Perceived Dyspnea (Exercise) 1 -- -- -- --   Symptoms SOB at end -- none none none   Comments walk test results first full day of exercise -- -- --   Duration -- Progress to 30 minutes of  aerobic without signs/symptoms of physical distress Continue with 30 min of aerobic exercise without signs/symptoms of physical distress. Continue with 30 min of aerobic exercise without signs/symptoms of physical distress. Continue with 30 min of aerobic exercise without signs/symptoms of physical distress.   Intensity -- -- THRR unchanged THRR unchanged THRR unchanged     Progression   Progression -- Continue to progress workloads to maintain intensity without signs/symptoms of physical distress. Continue to progress workloads to maintain intensity without signs/symptoms of physical distress. Continue to progress workloads to maintain intensity without signs/symptoms of physical distress. Continue to progress workloads to maintain intensity without signs/symptoms of physical distress.   Average METs -- -- 3.04 2.4 3.41     Resistance Training   Training Prescription -- Yes Yes Yes Yes   Weight -- 3 lb 3 lb 3 lb 3 lb   Reps -- 10-15 10-15 10-15 10-15     Interval Training   Interval Training -- -- No No No     Treadmill  MPH -- 1.9 2.3 2.5 2.7   Grade -- 0.5 0.5 0.5 1   Minutes -- '15 15 15 15   ' METs -- 2.59 2.92 3.09 3.44     Recumbant Elliptical   Level -- 1 1 -- 1    Minutes -- 15 15 -- 15   METs -- 1.6 1.6 -- 2     REL-XR   Level -- -- '1 1 1   ' Minutes -- -- '15 15 15   ' METs -- -- 4.6 1.7 4.8     Home Exercise Plan   Plans to continue exercise at -- -- -- -- Home (comment)  walking   Frequency -- -- -- -- Add 2 additional days to program exercise sessions.   Initial Home Exercises Provided -- -- -- -- 11/18/19   Row Name 12/03/19 1300             Response to Exercise   Blood Pressure (Admit) 140/80       Blood Pressure (Exercise) 162/72       Blood Pressure (Exit) 122/70       Heart Rate (Admit) 65 bpm       Heart Rate (Exercise) 125 bpm       Heart Rate (Exit) 98 bpm       Rating of Perceived Exertion (Exercise) 13       Symptoms none       Duration Continue with 30 min of aerobic exercise without signs/symptoms of physical distress.       Intensity THRR unchanged         Progression   Progression Continue to progress workloads to maintain intensity without signs/symptoms of physical distress.       Average METs 4.25         Resistance Training   Training Prescription Yes       Weight 3 lb       Reps 10-15         Interval Training   Interval Training No         Treadmill   MPH 2.8       Grade 1       Minutes 15       METs 3.53         REL-XR   Level 4       Watts 50       Minutes 15       METs 5         Home Exercise Plan   Plans to continue exercise at Home (comment)  walking       Frequency Add 2 additional days to program exercise sessions.       Initial Home Exercises Provided 11/18/19              Exercise Comments:   Exercise Goals and Review:  Exercise Goals    Row Name 09/25/19 1204             Exercise Goals   Increase Physical Activity Yes       Intervention Provide advice, education, support and counseling about physical activity/exercise needs.;Develop an individualized exercise prescription for aerobic and resistive training based on initial evaluation findings, risk stratification,  comorbidities and participant's personal goals.       Expected Outcomes Short Term: Attend rehab on a regular basis to increase amount of physical activity.;Long Term: Add in home exercise to make exercise part of routine and to increase amount of physical activity.;Long Term: Exercising  regularly at least 3-5 days a week.       Increase Strength and Stamina Yes       Intervention Provide advice, education, support and counseling about physical activity/exercise needs.;Develop an individualized exercise prescription for aerobic and resistive training based on initial evaluation findings, risk stratification, comorbidities and participant's personal goals.       Expected Outcomes Short Term: Increase workloads from initial exercise prescription for resistance, speed, and METs.;Short Term: Perform resistance training exercises routinely during rehab and add in resistance training at home;Long Term: Improve cardiorespiratory fitness, muscular endurance and strength as measured by increased METs and functional capacity (6MWT)       Able to understand and use rate of perceived exertion (RPE) scale Yes       Intervention Provide education and explanation on how to use RPE scale       Expected Outcomes Short Term: Able to use RPE daily in rehab to express subjective intensity level;Long Term:  Able to use RPE to guide intensity level when exercising independently       Able to understand and use Dyspnea scale Yes       Intervention Provide education and explanation on how to use Dyspnea scale       Expected Outcomes Short Term: Able to use Dyspnea scale daily in rehab to express subjective sense of shortness of breath during exertion;Long Term: Able to use Dyspnea scale to guide intensity level when exercising independently       Knowledge and understanding of Target Heart Rate Range (THRR) Yes       Intervention Provide education and explanation of THRR including how the numbers were predicted and where they  are located for reference       Expected Outcomes Short Term: Able to state/look up THRR;Short Term: Able to use daily as guideline for intensity in rehab;Long Term: Able to use THRR to govern intensity when exercising independently       Able to check pulse independently Yes       Intervention Provide education and demonstration on how to check pulse in carotid and radial arteries.;Review the importance of being able to check your own pulse for safety during independent exercise       Expected Outcomes Short Term: Able to explain why pulse checking is important during independent exercise;Long Term: Able to check pulse independently and accurately       Understanding of Exercise Prescription Yes       Intervention Provide education, explanation, and written materials on patient's individual exercise prescription       Expected Outcomes Short Term: Able to explain program exercise prescription;Long Term: Able to explain home exercise prescription to exercise independently              Exercise Goals Re-Evaluation :  Exercise Goals Re-Evaluation    Row Name 10/02/19 0955 10/09/19 1727 10/23/19 1008 11/07/19 0856 11/18/19 1026     Exercise Goal Re-Evaluation   Exercise Goals Review Increase Physical Activity;Able to understand and use rate of perceived exertion (RPE) scale;Knowledge and understanding of Target Heart Rate Range (THRR);Understanding of Exercise Prescription;Increase Strength and Stamina;Able to check pulse independently Increase Physical Activity;Able to understand and use rate of perceived exertion (RPE) scale;Knowledge and understanding of Target Heart Rate Range (THRR);Understanding of Exercise Prescription;Increase Strength and Stamina;Able to check pulse independently Increase Physical Activity;Increase Strength and Stamina;Understanding of Exercise Prescription Increase Physical Activity;Increase Strength and Stamina Increase Physical Activity;Increase Strength and Stamina;Able  to understand and use rate  of perceived exertion (RPE) scale;Knowledge and understanding of Target Heart Rate Range (THRR);Able to check pulse independently   Comments Reviewed RPE and dyspnea scales, THR and program prescription with pt today.  Pt voiced understanding and was given a copy of goals to take home. Luva is doing well in rehab as it has been her first week. She is adjusting well to the exercise machines. HR has been maintained well within her range. Will continue to monitor. Nima continues to do well in rehab.  She is up 2.3 mph on the treadmill!!  We will continue to monitor her progress. Cheryl Briggs attends consistently and works in [employer] range.  She is now up to 2.5 mph on TM. Reviewed home exercise with pt today.  Pt plans to walk for exercise.  Reviewed THR, pulse, RPE, sign and symptoms, pulse oximetery and when to call 911 or MD.  Also discussed weather considerations and indoor options.  Pt voiced understanding.   Expected Outcomes Short: Use RPE daily to regulate intensity. Long: Follow program prescription in THR. Short: Continue attending rehab regularly Long: Increase overall strength/ stamina Short: Increase spm on recumbent elliptical  Long: Continue to improve stamina Short:  continue to attend regularly Long: improve stamina and MET level Short: monitor vitals during exercise Long:  improve overall MET level   Row Name 11/20/19 1517 12/03/19 1318           Exercise Goal Re-Evaluation   Exercise Goals Review Increase Physical Activity;Increase Strength and Stamina;Understanding of Exercise Prescription Increase Physical Activity;Increase Strength and Stamina;Understanding of Exercise Prescription      Comments Cheryl Briggs is doing well in rehab.  She is enjoying her three days a week now.  She is up to 4.8 METs on the XR.  We will continue to montior her progress. harriet isprogessing well. She has increased speed on TM and to level 4 on XR.  Staff will encourage trying 4 lb  weights.      Expected Outcomes Short: Increase workload on XR.  Long; Conitnue to improve stamina. Short:use 4 lb for strength work Long: improve overall stamina             Discharge Exercise Prescription (Final Exercise Prescription Changes):  Exercise Prescription Changes - 12/03/19 1300      Response to Exercise   Blood Pressure (Admit) 140/80    Blood Pressure (Exercise) 162/72    Blood Pressure (Exit) 122/70    Heart Rate (Admit) 65 bpm    Heart Rate (Exercise) 125 bpm    Heart Rate (Exit) 98 bpm    Rating of Perceived Exertion (Exercise) 13    Symptoms none    Duration Continue with 30 min of aerobic exercise without signs/symptoms of physical distress.    Intensity THRR unchanged      Progression   Progression Continue to progress workloads to maintain intensity without signs/symptoms of physical distress.    Average METs 4.25      Resistance Training   Training Prescription Yes    Weight 3 lb    Reps 10-15      Interval Training   Interval Training No      Treadmill   MPH 2.8    Grade 1    Minutes 15    METs 3.53      REL-XR   Level 4    Watts 50    Minutes 15    METs 5      Home Exercise Plan   Plans to continue  exercise at Home (comment)   walking   Frequency Add 2 additional days to program exercise sessions.    Initial Home Exercises Provided 11/18/19           Nutrition:  Target Goals: Understanding of nutrition guidelines, daily intake of sodium <1544m, cholesterol <2076m calories 30% from fat and 7% or less from saturated fats, daily to have 5 or more servings of fruits and vegetables.  Education: All About Nutrition: -Group instruction provided by verbal, written material, interactive activities, discussions, models, and posters to present general guidelines for heart healthy nutrition including fat, fiber, MyPlate, the role of sodium in heart healthy nutrition, utilization of the nutrition label, and utilization of this knowledge for  meal planning. Follow up email sent as well. Written material given at graduation.   [facility]  [facility]19  [facility]- Verbalizes Understanding      Biometrics:  Pre Biometrics - 09/25/19 1205      Pre Biometrics   Height 5' 1.9" (1.572 m)    Weight 150 lb 6.4 oz (68.2 kg)    BMI (Calculated) 27.61    Single Leg Stand 3.5 seconds            Nutrition Therapy Plan and Nutrition Goals:  Nutrition Therapy & Goals - 10/08/19 0916      Nutrition Therapy   Diet Heart healthy, Low Na    Protein (specify units) 55g    Fiber 25 grams    Whole Grain Foods 3 servings    Saturated Fats 12 max. grams    Fruits and Vegetables 5 servings/day    Sodium 1.5 grams      Personal Nutrition Goals   Nutrition Goal ST: include vegetables and fruit - try roasted, try daves killer bread LT: Doesn't want to have another heart event.    Comments B: toast with light margaine and applesauce and cinammon :L: half sandwich with tuKuwaitr ham or smart popcorn with almond milk S: fruit (plum, orange, watermelon) L: half sandwich, piece of chicken (mostly baked)  D: frozen diet dinner, 1/2 piece of baked chicken breast, half sandwich. whole grain bread. Discussed heart healthy changes.      Intervention Plan   Intervention Prescribe, educate and counsel regarding individualized specific dietary modifications aiming towards targeted core components such as weight, hypertension, lipid management, diabetes, heart failure and other comorbidities.;Nutrition handout(s) given to patient.    Expected Outcomes Short Term Goal: Understand basic principles of dietary content, such as calories, fat, sodium, cholesterol and nutrients.;Short Term Goal: A plan has been developed with personal nutrition goals set during dietitian appointment.;Long Term Goal: Adherence to prescribed nutrition plan.           Nutrition Assessments:   Nutrition Assessments - 09/25/19 1206      MEDFICTS Scores   Pre Score 32          MEDIFICTS Score Key:  ?70 Need to make dietary changes   40-70 Heart Healthy Diet  ? 40 Therapeutic Level Cholesterol Diet   Picture Your Plate Scores:  <4<67nhealthy dietary pattern with much room for improvement.  41-50 Dietary pattern unlikely to meet recommendations for good health and room for improvement.  51-60 More healthful dietary pattern, with some room for improvement.   >60 Healthy dietary pattern, although there may be some specific behaviors that could be improved.    Nutrition Goals Re-Evaluation:  Nutrition Goals Re-Evaluation  [place] Name 11/18/19 1024             Goals   Current Weight 144 lb (65.3 kg)       Nutrition Goal Cheryl Briggs is trying to get more fruits and vegetables daily.  She is watching her sodium.              Nutrition Goals Discharge (Final Nutrition Goals Re-Evaluation):  Nutrition Goals Re-Evaluation - 11/18/19 1024      Goals   Current Weight 144 lb (65.3 kg)    Nutrition Goal Cheryl Briggs is trying to get more fruits and vegetables daily.  She is watching her sodium.           Psychosocial: Target Goals: Acknowledge presence or absence of significant depression and/or stress, maximize coping skills, provide positive support system. Participant is able to verbalize types and ability to use techniques and skills needed for reducing stress and depression.   Education: Stress, Anxiety, and Depression - Group verbal and visual presentation to define topics covered.  Reviews how body is impacted by stress, anxiety, and depression.  Also discusses healthy ways to reduce stress and to treat/manage anxiety and depression.  Written material given at graduation.   Cardiac Rehab from 12/04/2019 in [facility] Cardiac and Pulmonary Rehab  Date 10/16/19  Educator [facility]  Instruction Review Code 1- Verbalizes Understanding      Education: Sleep Hygiene -Provides  group verbal and written instruction about how sleep can affect your health.  Define sleep hygiene, discuss sleep cycles and impact of sleep habits. Review good sleep hygiene tips.    Initial Review & Psychosocial Screening:  Initial Psych Review & Screening - 09/19/19 1038      Initial Review   Current issues with None Identified      Family Dynamics   Good Support System? Yes    Comments She can look to her daughter for support with whome she lives with.She has a positive outlook on her health and overall mental state.      Barriers   Psychosocial barriers to participate in program The patient should benefit from training in stress management and relaxation.;There are no identifiable barriers or psychosocial needs.      Screening Interventions   Interventions Encouraged to exercise;To provide support and resources with identified psychosocial needs;Provide feedback about the scores to participant    Expected Outcomes Short Term goal: Utilizing psychosocial counselor, staff and physician to assist with identification of specific Stressors or current issues interfering with healing process. Setting desired goal for each stressor or current issue identified.;Long Term Goal: Stressors or current issues are controlled or eliminated.;Short Term goal: Identification and review with participant of any Quality of Life or Depression concerns found by scoring the questionnaire.;Long Term goal: The participant improves quality of Life and PHQ9 Scores as seen by post scores and/or verbalization of changes           Quality of Life Scores:   Quality of Life - 09/25/19 1205      Quality of Life   Select Quality of Life      Quality of Life Scores   Health/Function Pre 27.67 %    Socioeconomic Pre 30 %    Psych/Spiritual Pre 30 %    Family Pre 27 %    GLOBAL Pre 28.53 %          Scores of 19 and below usually indicate a poorer quality of life in these areas.  A difference of  2-3 points  is  a clinically meaningful difference.  A difference of 2-3 points in the total score of the Quality of Life Index has been associated with significant improvement in overall quality of life, self-image, physical symptoms, and general health in studies assessing change in quality of life.  PHQ-9: Recent Review Flowsheet Data    Depression screen [facility] 2/9 09/25/2019   Decreased Interest 0   Down, Depressed, Hopeless 0   PHQ - 2 Score 0   Altered sleeping 0   Tired, decreased energy 1   Change in appetite 1   Feeling bad or failure about yourself  0   Trouble concentrating 0   Moving slowly or fidgety/restless 0   Suicidal thoughts 0   PHQ-9 Score 2   Difficult doing work/chores Not difficult at all     Interpretation of Total Score  Total Score Depression Severity:  1-4 = Minimal depression, 5-9 = Mild depression, 10-14 = Moderate depression, 15-19 = Moderately severe depression, 20-27 = Severe depression   Psychosocial Evaluation and Intervention:  Psychosocial Evaluation - 09/19/19 1040      Psychosocial Evaluation & Interventions   Interventions Encouraged to exercise with the program and follow exercise prescription    Comments She can look to her daughter for support with whome she lives with.She has a positive outlook on her health and overall mental state.    Expected Outcomes Short: Exercise regularly to support mental health and notify staff of any changes. Long: maintain mental health and well being through teaching of rehab or prescribed medications independently.    Continue Psychosocial Services  Follow up required by counselor           Psychosocial Re-Evaluation:  Psychosocial Re-Evaluation    [place] Name 11/18/19 1007             Psychosocial Re-Evaluation   Current issues with Current Stress Concerns;Current Sleep Concerns       Comments Cheryl Briggs reports no stress.  She does have trouble staying asleep.  This has just started in the past week.   Staff recommended  she talk to her Dr if it continues.  We reviewed not using caffeine late in the day and/or screens.       Expected Outcomes Short:  try deep breathing or reading to help sleep patterns Long:  maintian positive outlook              Psychosocial Discharge (Final Psychosocial Re-Evaluation):  Psychosocial Re-Evaluation - 11/18/19 1007      Psychosocial Re-Evaluation   Current issues with Current Stress Concerns;Current Sleep Concerns    Comments Cheryl Briggs reports no stress.  She does have trouble staying asleep.  This has just started in the past week.   Staff recommended she talk to her Dr if it continues.  We reviewed not using caffeine late in the day and/or screens.    Expected Outcomes Short:  try deep breathing or reading to help sleep patterns Long:  maintian positive outlook           Vocational Rehabilitation: Provide vocational rehab assistance to qualifying candidates.   Vocational Rehab Evaluation & Intervention:   Education: Education Goals: Education classes will be provided on a variety of topics geared toward better understanding of heart health and risk factor modification. Participant will state understanding/return demonstration of topics presented as noted by education test scores.  Learning Barriers/Preferences:  Learning Barriers/Preferences - 09/19/19 1037      Learning Barriers/Preferences   Learning Barriers None  Learning Preferences None           General Cardiac Education Topics:  AED/CPR: - Group verbal and written instruction with the use of models to demonstrate the basic use of the AED with the basic ABC's of resuscitation.   Anatomy and Cardiac Procedures: - Group verbal and visual presentation and models provide information about basic cardiac anatomy and function. Reviews the testing methods done to diagnose heart disease and the outcomes of the test results. Describes the treatment choices: Medical Management, Angioplasty, or Coronary  Bypass Surgery for treating various heart conditions including Myocardial Infarction, Angina, Valve Disease, and Cardiac Arrhythmias.  Written material given at graduation.   Cardiac Rehab from 12/04/2019 in [facility] Cardiac and Pulmonary Rehab  Date 09/25/19  Instruction Review Code 3- Needs Reinforcement  [need identified]      Medication Safety: - Group verbal and visual instruction to review commonly prescribed medications for heart and lung disease. Reviews the medication, class of the drug, and side effects. Includes the steps to properly store meds and maintain the prescription regimen.  Written material given at graduation.   Intimacy: - Group verbal instruction through game format to discuss how heart and lung disease can affect sexual intimacy. Written material given at graduation..   Cardiac Rehab from 12/04/2019 in [facility] Cardiac and Pulmonary Rehab  Date 10/30/19  Educator [facility]  Instruction Review Code 1- Verbalizes Understanding      Know Your Numbers and Heart Failure: - Group verbal and visual instruction to discuss disease risk factors for cardiac and pulmonary disease and treatment options.  Reviews associated critical values for Overweight/Obesity, Hypertension, Cholesterol, and Diabetes.  Discusses basics of heart failure: signs/symptoms and treatments.  Introduces Heart Failure Zone chart for action plan for heart failure.  Written material given at graduation.   Cardiac Rehab from 12/04/2019 in [facility] Cardiac and Pulmonary Rehab  Date 12/04/19  Educator SB  Instruction Review Code 1- Verbalizes Understanding      Infection Prevention: - Provides verbal and written material to individual with discussion of infection control including proper hand washing and proper equipment cleaning during exercise session.   Cardiac Rehab from 12/04/2019 in [facility] Cardiac and Pulmonary Rehab  Date 09/19/19  Educator [facility]  Instruction Review Code 1- Verbalizes Understanding      Falls  Prevention: - Provides verbal and written material to individual with discussion of falls prevention and safety.   Cardiac Rehab from 12/04/2019 in [facility] Cardiac and Pulmonary Rehab  Date 09/19/19  Educator [facility]  Instruction Review Code 1- Verbalizes Understanding      Other: -Provides group and verbal instruction on various topics (see comments)   Knowledge Questionnaire Score:  Knowledge Questionnaire Score - 09/25/19 1206      Knowledge Questionnaire Score   Pre Score 23/26 Education Focus: Exercise, nutrition, angina           Core Components/Risk Factors/Patient Goals at Admission:  Personal Goals and Risk Factors at Admission - 09/25/19 1206      Core Components/Risk Factors/Patient Goals on Admission    Weight Management Yes;Weight Loss    Intervention Weight Management: Develop a combined nutrition and exercise program designed to reach desired caloric intake, while maintaining appropriate intake of nutrient and fiber, sodium and fats, and appropriate energy expenditure required for the weight goal.;Weight Management: Provide education and appropriate resources to help participant work on and attain dietary goals.    Admit Weight 150 lb 6.4 oz (68.2 kg)    Goal Weight: Short  Term 145 lb (65.8 kg)    Goal Weight: Long Term 140 lb (63.5 kg)    Expected Outcomes Short Term: Continue to assess and modify interventions until short term weight is achieved;Long Term: Adherence to nutrition and physical activity/exercise program aimed toward attainment of established weight goal;Understanding recommendations for meals to include 15-35% energy as protein, 25-35% energy from fat, 35-60% energy from carbohydrates, less than 262m of dietary cholesterol, 20-35 gm of total fiber daily;Understanding of distribution of calorie intake throughout the day with the consumption of 4-5 meals/snacks;Weight Loss: Understanding of general recommendations for a balanced deficit meal plan, which promotes  1-2 lb weight loss per week and includes a negative energy balance of [id number] kcal/d    Heart Failure Yes    Intervention Provide a combined exercise and nutrition program that is supplemented with education, support and counseling about heart failure. Directed toward relieving symptoms such as shortness of breath, decreased exercise tolerance, and extremity edema.    Expected Outcomes Improve functional capacity of life;Short term: Attendance in program 2-3 days a week with increased exercise capacity. Reported lower sodium intake. Reported increased fruit and vegetable intake. Reports medication compliance.;Short term: Daily weights obtained and reported for increase. Utilizing diuretic protocols set by physician.;Long term: Adoption of self-care skills and reduction of barriers for early signs and symptoms recognition and intervention leading to self-care maintenance.    Hypertension Yes    Intervention Provide education on lifestyle modifcations including regular physical activity/exercise, weight management, moderate sodium restriction and increased consumption of fresh fruit, vegetables, and low fat dairy, alcohol moderation, and smoking cessation.;Monitor prescription use compliance.    Expected Outcomes Long Term: Maintenance of blood pressure at goal levels.;Short Term: Continued assessment and intervention until BP is < 140/949mHG in hypertensive participants. < 130/8036mG in hypertensive participants with diabetes, heart failure or chronic kidney disease.    Lipids Yes    Intervention Provide education and support for participant on nutrition & aerobic/resistive exercise along with prescribed medications to achieve LDL <87m19mDL >40mg45m Expected Outcomes Short Term: Participant states understanding of desired cholesterol values and is compliant with medications prescribed. Participant is following exercise prescription and nutrition guidelines.;Long Term: Cholesterol controlled with  medications as prescribed, with individualized exercise RX and with personalized nutrition plan. Value goals: LDL < 87mg,38m > 40 mg.           Education:Diabetes - Individual verbal and written instruction to review signs/symptoms of diabetes, desired ranges of glucose level fasting, after meals and with exercise. Acknowledge that pre and post exercise glucose checks will be done for 3 sessions at entry of program.   Core Components/Risk Factors/Patient Goals Review:   Goals and Risk Factor Review    Row Name 11/18/19 1004             Core Components/Risk Factors/Patient Goals Review   Personal Goals Review Weight Management/Obesity;Hypertension;Lipids       Review HarrieReino Bellissing weight a little at a time.  She checks her BP at home sometimes.  Staff recommended she check it on days not at [facility].  [facility] as [facility].       Expected Outcomes Short:  continue current healthy eating plan Long: manage risk factors on her own              Core Components/Risk Factors/Patient Goals at Discharge (Final Review):   Goals and Risk Factor Review - 11/18/19 1004  Core Components/Risk Factors/Patient Goals Review   Personal Goals Review Weight Management/Obesity;Hypertension;Lipids    Review Cheryl Briggs is losing weight a little at a time.  She checks her BP at home sometimes.  Staff recommended she check it on days not at [facility].  She reports taking all medications as directed.    Expected Outcomes Short:  continue current healthy eating plan Long: manage risk factors on her own           ITP Comments:  ITP Comments    Row Name 09/19/19 1036 09/25/19 1159 10/02/19 0955 10/08/19 1006 10/16/19 0554   ITP Comments Virtual Visit completed. Patient informed on EP and RD appointment and 6 Minute walk test. Patient also informed of patient health questionnaires on My Chart. Patient Verbalizes understanding. Visit diagnosis can be found in [facility] 08/15/2019. Completed 6MWT  and gym orientation. Initial ITP created and sent for review to Dr. Emily Filbert, Medical Director. First full day of exercise!  Patient was oriented to gym and equipment including functions, settings, policies, and procedures.  Patient's individual exercise prescription and treatment plan were reviewed.  All starting workloads were established based on the results of the 6 minute walk test done at initial orientation visit.  The plan for exercise progression was also introduced and progression will be customized based on patient's performance and goals. Completed Initial RD Evaluation 30 Day review completed. Medical Director ITP review done, changes made as directed, and signed approval by Medical Director.   [place] Name 11/13/19 [phone] 12/11/19 0958         ITP Comments 30 Day review completed. Medical Director ITP review done, changes made as directed, and signed approval by Medical Director. 30 Day review completed. Medical Director ITP review done, changes made as directed, and signed approval by Medical Director.             Comments:

## 2019-12-13 ENCOUNTER — Other Ambulatory Visit: Payer: Self-pay

## 2019-12-13 ENCOUNTER — Encounter: Payer: Medicare Other | Attending: Cardiovascular Disease

## 2019-12-13 DIAGNOSIS — I214 Non-ST elevation (NSTEMI) myocardial infarction: Secondary | ICD-10-CM | POA: Diagnosis not present

## 2019-12-13 DIAGNOSIS — Z955 Presence of coronary angioplasty implant and graft: Secondary | ICD-10-CM

## 2019-12-13 NOTE — Progress Notes (Signed)
Daily Session Note  Patient Details  Name: Cheryl Briggs MRN: [id number] Date of Birth: 06/15/1945 Referring Provider:     Cardiac Rehab from 09/25/2019 in [facility] Cardiac and Pulmonary Rehab  Referring Provider Kathlyn Sacramento MD      Encounter Date: 12/13/2019  Check In:  Session Check In - 12/13/19 0953      Check-In   Supervising physician immediately available to respond to emergencies See telemetry face sheet for immediately available ER MD    Location [facility]-Cardiac & Pulmonary Rehab    Staff Present Birdie Sons, MPA, RN;Laureen Owens Shark, BS, RRT, CPFT;Joseph Alcus Dad, RN BSN    Virtual Visit No    Medication changes reported     No    Fall or balance concerns reported    No    Warm-up and Cool-down Performed on first and last piece of equipment    Resistance Training Performed Yes    VAD Patient? No    PAD/SET Patient? No      Pain Assessment   Currently in Pain? No/denies              Social History   Tobacco Use  Smoking Status Never Smoker  Smokeless Tobacco Never Used    Goals Met:  Independence with exercise equipment Exercise tolerated well No report of cardiac concerns or symptoms Strength training completed today  Goals Unmet:  Not Applicable  Comments: Pt able to follow exercise prescription today without complaint.  Will continue to monitor for progression.    Dr. Emily Filbert is Medical Director for [place] and [name].

## 2019-12-16 ENCOUNTER — Other Ambulatory Visit: Payer: Self-pay

## 2019-12-16 ENCOUNTER — Encounter: Payer: Medicare Other | Admitting: *Deleted

## 2019-12-16 DIAGNOSIS — I214 Non-ST elevation (NSTEMI) myocardial infarction: Secondary | ICD-10-CM

## 2019-12-16 DIAGNOSIS — Z955 Presence of coronary angioplasty implant and graft: Secondary | ICD-10-CM

## 2019-12-16 NOTE — Progress Notes (Signed)
Daily Session Note  Patient Details  Name: Cheryl Briggs MRN: [id number] Date of Birth: October 26, 1945 Referring Provider:     Cardiac Rehab from 09/25/2019 in [facility] Cardiac and Pulmonary Rehab  Referring Provider Kathlyn Sacramento MD      Encounter Date: 12/16/2019  Check In:  Session Check In - 12/16/19 1046      Check-In   Supervising physician immediately available to respond to emergencies See telemetry face sheet for immediately available ER MD    Location [facility]-Cardiac & Pulmonary Rehab    Staff Present Renita Papa, RN Moises Blood, BS, ACSM CEP, Exercise Physiologist;Amanda Oletta Darter, [place], ACSM CEP, Exercise Physiologist    Virtual Visit No    Medication changes reported     No    Fall or balance concerns reported    No    Warm-up and Cool-down Performed on first and last piece of equipment    Resistance Training Performed Yes    VAD Patient? No    PAD/SET Patient? No      Pain Assessment   Currently in Pain? No/denies              Social History   Tobacco Use  Smoking Status Never Smoker  Smokeless Tobacco Never Used    Goals Met:  Independence with exercise equipment Exercise tolerated well No report of cardiac concerns or symptoms Strength training completed today  Goals Unmet:  Not Applicable  Comments: Pt able to follow exercise prescription today without complaint.  Will continue to monitor for progression.   6 Minute Walk    Row Name 09/25/19 1159         6 Minute Walk   Phase Initial     Distance 1095 feet     Walk Time 6 minutes     # of Rest Breaks 0     MPH 2.07     METS 2.47     RPE 11     Perceived Dyspnea  1     VO2 Peak 8.64     Symptoms Yes (comment)     Comments slightly SOB at end     Resting HR 96 bpm     Resting BP 124/62     Resting Oxygen Saturation  95 %     Exercise Oxygen Saturation  during 6 min walk 96 %     Max Ex. HR 112 bpm     Max Ex. BP 154/74     2 Minute Post BP 126/64             Dr. Emily Filbert is Medical Director for [place] and [name].

## 2019-12-17 ENCOUNTER — Other Ambulatory Visit: Payer: Self-pay | Admitting: Cardiovascular Disease

## 2019-12-17 MED ORDER — ATORVASTATIN CALCIUM 80 MG PO TABS: 80.0000 mg | ORAL_TABLET | Freq: Every day | ORAL | 0 refills | Status: DC

## 2019-12-17 MED ORDER — TICAGRELOR 90 MG PO TABS: 90.0000 mg | ORAL_TABLET | Freq: Two times a day (BID) | ORAL | 0 refills | Status: DC

## 2019-12-17 NOTE — Telephone Encounter (Signed)
*  STAT* If patient is at the pharmacy, call can be transferred to refill team.   1. Which medications need to be refilled? (please list name of each medication and dose if known)    Atorvastatin 80 mg po q d  Brilinta 90 mg po BID  2. Which pharmacy/location (including street and city if local pharmacy) is medication to be sent to?  [facility] [place]  [address], [place] [address]   3. Do they need a 30 day or 90 day supply? [place]

## 2019-12-17 NOTE — Telephone Encounter (Signed)
Requested Prescriptions   Signed Prescriptions Disp Refills  . atorvastatin (LIPITOR) 80 MG tablet 90 tablet 0    Sig: Take 1 tablet (80 mg total) by mouth daily.    Authorizing Provider: Kathlyn Sacramento A    Ordering User: Britt Bottom ticagrelor (BRILINTA) 90 MG TABS tablet 180 tablet 0    Sig: Take 1 tablet (90 mg total) by mouth 2 (two) times daily.    Authorizing Provider: Kathlyn Sacramento A    Ordering User: Britt Bottom

## 2019-12-18 ENCOUNTER — Other Ambulatory Visit: Payer: Self-pay

## 2019-12-18 DIAGNOSIS — I214 Non-ST elevation (NSTEMI) myocardial infarction: Secondary | ICD-10-CM

## 2019-12-18 DIAGNOSIS — Z955 Presence of coronary angioplasty implant and graft: Secondary | ICD-10-CM

## 2019-12-18 NOTE — Progress Notes (Signed)
Daily Session Note  Patient Details  Name: Memory Heinrichs MRN: [id number] Date of Birth: 30-Aug-1945 Referring Provider:     Cardiac Rehab from 09/25/2019 in [facility] Cardiac and Pulmonary Rehab  Referring Provider Kathlyn Sacramento MD      Encounter Date: 12/18/2019  Check In:  Session Check In - 12/18/19 1031      Check-In   Supervising physician immediately available to respond to emergencies See telemetry face sheet for immediately available ER MD    Location [facility]-Cardiac & Pulmonary Rehab    Staff Present Birdie Sons, MPA, Elveria Rising, BA, ACSM CEP, Exercise Physiologist;Joseph Tessie Fass RCP,RRT,BSRT    Virtual Visit No    Medication changes reported     No    Fall or balance concerns reported    No    Warm-up and Cool-down Performed on first and last piece of equipment    Resistance Training Performed Yes    VAD Patient? No    PAD/SET Patient? No      Pain Assessment   Currently in Pain? No/denies             Exercise Prescription Changes - 12/18/19 1000      Response to Exercise   Blood Pressure (Admit) 122/62    Blood Pressure (Exercise) 154/64    Blood Pressure (Exit) 104/50    Heart Rate (Admit) 91 bpm    Heart Rate (Exercise) 124 bpm    Heart Rate (Exit) 88 bpm    Rating of Perceived Exertion (Exercise) 13    Symptoms none    Duration Continue with 30 min of aerobic exercise without signs/symptoms of physical distress.    Intensity THRR unchanged      Progression   Progression Continue to progress workloads to maintain intensity without signs/symptoms of physical distress.    Average METs 4.55      Resistance Training   Training Prescription Yes    Weight 3 lb    Reps 10-15      Interval Training   Interval Training No      Treadmill   MPH 3    Grade 1    Minutes 15    METs 3.71      REL-XR   Level 1    Minutes 15    METs 5.4      Home Exercise Plan   Plans to continue exercise at Home (comment)   walking   Frequency Add 2 additional  days to program exercise sessions.    Initial Home Exercises Provided 11/18/19           Social History   Tobacco Use  Smoking Status Never Smoker  Smokeless Tobacco Never Used    Goals Met:  Independence with exercise equipment Exercise tolerated well No report of cardiac concerns or symptoms Strength training completed today  Goals Unmet:  Not Applicable  Comments: Pt able to follow exercise prescription today without complaint.  Will continue to monitor for progression.    Dr. Emily Filbert is Medical Director for [place] and [name].

## 2019-12-20 ENCOUNTER — Other Ambulatory Visit: Payer: Self-pay

## 2019-12-20 DIAGNOSIS — Z955 Presence of coronary angioplasty implant and graft: Secondary | ICD-10-CM

## 2019-12-20 DIAGNOSIS — I214 Non-ST elevation (NSTEMI) myocardial infarction: Secondary | ICD-10-CM

## 2019-12-20 NOTE — Progress Notes (Signed)
Daily Session Note  Patient Details  Name: Cheryl Briggs MRN: [id number] Date of Birth: [birth year] Referring Provider:   Flowsheet Row Cardiac Rehab from 09/25/2019 in [facility] Cardiac and Pulmonary Rehab  Referring Provider Kathlyn Sacramento MD      Encounter Date: 12/20/2019  Check In:  Session Check In - 12/20/19 0941      Check-In   Supervising physician immediately available to respond to emergencies See telemetry face sheet for immediately available ER MD    Location [facility]-Cardiac & Pulmonary Rehab    Staff Present Birdie Sons, MPA, RN;Joseph Darrin Nipper, [place], RCEP, CCRP, CCET    Virtual Visit No    Medication changes reported     No    Fall or balance concerns reported    No    Warm-up and Cool-down Performed on first and last piece of equipment    Resistance Training Performed Yes    VAD Patient? No    PAD/SET Patient? No      Pain Assessment   Currently in Pain? No/denies              Social History   Tobacco Use  Smoking Status Never Smoker  Smokeless Tobacco Never Used    Goals Met:  Independence with exercise equipment Exercise tolerated well No report of cardiac concerns or symptoms Strength training completed today  Goals Unmet:  Not Applicable  Comments: Pt able to follow exercise prescription today without complaint.  Will continue to monitor for progression.    Dr. Emily Filbert is Medical Director for [place] and [name].

## 2019-12-23 ENCOUNTER — Other Ambulatory Visit: Payer: Self-pay

## 2019-12-23 ENCOUNTER — Encounter: Payer: Medicare Other | Admitting: *Deleted

## 2019-12-23 DIAGNOSIS — I214 Non-ST elevation (NSTEMI) myocardial infarction: Secondary | ICD-10-CM

## 2019-12-23 DIAGNOSIS — Z955 Presence of coronary angioplasty implant and graft: Secondary | ICD-10-CM

## 2019-12-23 NOTE — Progress Notes (Signed)
Daily Session Note  Patient Details  Name: Cheryl Briggs MRN: [id number] Date of Birth: 12/15/1945 Referring Provider:   Flowsheet Row Cardiac Rehab from 09/25/2019 in [facility] Cardiac and Pulmonary Rehab  Referring Provider Kathlyn Sacramento MD      Encounter Date: 12/23/2019  Check In:  Session Check In - 12/23/19 1032      Check-In   Supervising physician immediately available to respond to emergencies See telemetry face sheet for immediately available ER MD    Location [facility]-Cardiac & Pulmonary Rehab    Staff Present Renita Papa, RN BSN;Joseph [address] [place], [place], ACSM CEP, Exercise Physiologist;Amanda Oletta Darter, [place], ACSM CEP, Exercise Physiologist    Virtual Visit No    Medication changes reported     No    Fall or balance concerns reported    No    Warm-up and Cool-down Performed on first and last piece of equipment    Resistance Training Performed Yes    VAD Patient? No    PAD/SET Patient? No      Pain Assessment   Currently in Pain? No/denies              Social History   Tobacco Use  Smoking Status Never Smoker  Smokeless Tobacco Never Used    Goals Met:  Independence with exercise equipment Exercise tolerated well No report of cardiac concerns or symptoms Strength training completed today  Goals Unmet:  Not Applicable  Comments: Pt able to follow exercise prescription today without complaint.  Will continue to monitor for progression.    Dr. Emily Filbert is Medical Director for [place] and [name].

## 2019-12-25 ENCOUNTER — Telehealth: Payer: Self-pay | Admitting: Cardiovascular Disease

## 2019-12-25 ENCOUNTER — Other Ambulatory Visit: Payer: Self-pay

## 2019-12-25 ENCOUNTER — Encounter: Payer: Medicare Other | Admitting: *Deleted

## 2019-12-25 DIAGNOSIS — I214 Non-ST elevation (NSTEMI) myocardial infarction: Secondary | ICD-10-CM

## 2019-12-25 MED ORDER — LOSARTAN POTASSIUM 25 MG PO TABS: 12.5000 mg | ORAL_TABLET | Freq: Every day | ORAL | 1 refills | Status: DC

## 2019-12-25 NOTE — Patient Instructions (Signed)
Discharge Patient Instructions  Patient Details  Name: Cheryl Briggs MRN: [id number] Date of Birth: 02/21/1945 Referring Provider:  Wellington Hampshire, MD   Number of Visits: 82  Reason for Discharge:  Patient reached a stable level of exercise. Patient independent in their exercise. Patient has met program and personal goals.  Smoking History:  Social History   Tobacco Use  Smoking Status Never Smoker  Smokeless Tobacco Never Used    Diagnosis:  NSTEMI (non-ST elevated myocardial infarction) ([place])  Status post coronary artery stent placement  Initial Exercise Prescription:  Initial Exercise Prescription - 09/25/19 1200      Date of Initial Exercise RX and Referring Provider   Date 09/25/19    Referring Provider Kathlyn Sacramento MD      Treadmill   MPH 1.9    Grade 0.5    Minutes 15    METs 2.59      Recumbant Bike   Level 1    RPM 50    Watts 10    Minutes 15    METs 2.5      NuStep   Level 1    SPM 80    Minutes 15    METs 2.5      Recumbant Elliptical   Level 1    RPM 50    Minutes 15    METs 2      Prescription Details   Frequency (times per week) 2    Duration Progress to 30 minutes of continuous aerobic without signs/symptoms of physical distress      Intensity   THRR 40-80% of Max Heartrate 110-134    Ratings of Perceived Exertion 11-13    Perceived Dyspnea 0-4      Progression   Progression Continue to progress workloads to maintain intensity without signs/symptoms of physical distress.      Resistance Training   Training Prescription Yes    Weight 3 lb           Discharge Exercise Prescription (Final Exercise Prescription Changes):  Exercise Prescription Changes - 12/18/19 1000      Response to Exercise   Blood Pressure (Admit) 122/62    Blood Pressure (Exercise) 154/64    Blood Pressure (Exit) 104/50    Heart Rate (Admit) 91 bpm    Heart Rate (Exercise) 124 bpm    Heart Rate (Exit) 88 bpm    Rating of Perceived  Exertion (Exercise) 13    Symptoms none    Duration Continue with 30 min of aerobic exercise without signs/symptoms of physical distress.    Intensity THRR unchanged      Progression   Progression Continue to progress workloads to maintain intensity without signs/symptoms of physical distress.    Average METs 4.55      Resistance Training   Training Prescription Yes    Weight 3 lb    Reps 10-15      Interval Training   Interval Training No      Treadmill   MPH 3    Grade 1    Minutes 15    METs 3.71      REL-XR   Level 1    Minutes 15    METs 5.4      Home Exercise Plan   Plans to continue exercise at Home (comment)   walking   Frequency Add 2 additional days to program exercise sessions.    Initial Home Exercises Provided 11/18/19  Functional Capacity:  6 Minute Walk    Row Name 09/25/19 1159 12/16/19 1131       6 Minute Walk   Phase Initial Discharge    Distance 1095 feet 1440 feet    Distance % Change -- 13.5 %    Distance Feet Change -- 345 ft    Walk Time 6 minutes 6 minutes    # of Rest Breaks 0 0    MPH 2.07 2.7    METS 2.47 3.2    RPE 11 13    Perceived Dyspnea  1 0    VO2 Peak 8.64 11.3    Symptoms Yes (comment) No    Comments slightly SOB at end --    Resting HR 96 bpm 90 bpm    Resting BP 124/62 122/62    Resting Oxygen Saturation  95 % --    Exercise Oxygen Saturation  during 6 min walk 96 % --    Max Ex. HR 112 bpm 124 bpm    Max Ex. BP 154/74 154/64    2 Minute Post BP 126/64 --           Quality of Life:  Quality of Life - 12/18/19 1140      Quality of Life Scores   Health/Function Pre 27.67 %    Health/Function Post 26.53 %    Health/Function % Change -4.12 %    Socioeconomic Pre 30 %    Socioeconomic Post 30 %    Socioeconomic % Change  0 %    Psych/Spiritual Pre 30 %    Psych/Spiritual Post 30 %    Psych/Spiritual % Change 0 %    Family Pre 27 %    Family Post 24.38 %    Family % Change -9.7 %    GLOBAL Pre  28.53 %    GLOBAL Post 27.67 %    GLOBAL % Change -3.01 %           Personal Goals: Goals established at orientation with interventions provided to work toward goal.  Personal Goals and Risk Factors at Admission - 09/25/19 1206      Core Components/Risk Factors/Patient Goals on Admission    Weight Management Yes;Weight Loss    Intervention Weight Management: Develop a combined nutrition and exercise program designed to reach desired caloric intake, while maintaining appropriate intake of nutrient and fiber, sodium and fats, and appropriate energy expenditure required for the weight goal.;Weight Management: Provide education and appropriate resources to help participant work on and attain dietary goals.    Admit Weight 150 lb 6.4 oz (68.2 kg)    Goal Weight: Short Term 145 lb (65.8 kg)    Goal Weight: Long Term 140 lb (63.5 kg)    Expected Outcomes Short Term: Continue to assess and modify interventions until short term weight is achieved;Long Term: Adherence to nutrition and physical activity/exercise program aimed toward attainment of established weight goal;Understanding recommendations for meals to include 15-35% energy as protein, 25-35% energy from fat, 35-60% energy from carbohydrates, less than 280m of dietary cholesterol, 20-35 gm of total fiber daily;Understanding of distribution of calorie intake throughout the day with the consumption of 4-5 meals/snacks;Weight Loss: Understanding of general recommendations for a balanced deficit meal plan, which promotes 1-2 lb weight loss per week and includes a negative energy balance of [phone] kcal/d    Heart Failure Yes    Intervention Provide a combined exercise and nutrition program that is supplemented with education, support and counseling about heart  failure. Directed toward relieving symptoms such as shortness of breath, decreased exercise tolerance, and extremity edema.    Expected Outcomes Improve functional capacity of life;Short  term: Attendance in program 2-3 days a week with increased exercise capacity. Reported lower sodium intake. Reported increased fruit and vegetable intake. Reports medication compliance.;Short term: Daily weights obtained and reported for increase. Utilizing diuretic protocols set by physician.;Long term: Adoption of self-care skills and reduction of barriers for early signs and symptoms recognition and intervention leading to self-care maintenance.    Hypertension Yes    Intervention Provide education on lifestyle modifcations including regular physical activity/exercise, weight management, moderate sodium restriction and increased consumption of fresh fruit, vegetables, and low fat dairy, alcohol moderation, and smoking cessation.;Monitor prescription use compliance.    Expected Outcomes Long Term: Maintenance of blood pressure at goal levels.;Short Term: Continued assessment and intervention until BP is < 140/74m HG in hypertensive participants. < 130/811mHG in hypertensive participants with diabetes, heart failure or chronic kidney disease.    Lipids Yes    Intervention Provide education and support for participant on nutrition & aerobic/resistive exercise along with prescribed medications to achieve LDL <7015mHDL >24m48m  Expected Outcomes Short Term: Participant states understanding of desired cholesterol values and is compliant with medications prescribed. Participant is following exercise prescription and nutrition guidelines.;Long Term: Cholesterol controlled with medications as prescribed, with individualized exercise RX and with personalized nutrition plan. Value goals: LDL < 70mg67mL > 40 mg.            Personal Goals Discharge:  Goals and Risk Factor Review - 12/18/19 1025      Core Components/Risk Factors/Patient Goals Review   Personal Goals Review Weight Management/Obesity;Hypertension;Lipids    Review harriet continues to lose weight slowly. She checks her BP at home when she  remembers - encouraged to check daily - especially when she leaves rehab. ~130/70 when she comes into rehab - today was a bit higher at 138/68. She reports taking all her medications as directed.    Expected Outcomes Short:  continue current healthy eating plan and exercise, take BP at home Long: manage risk factors on her own after graduating           Exercise Goals and Review:  Exercise Goals    Row Name 09/25/19 1204             Exercise Goals   Increase Physical Activity Yes       Intervention Provide advice, education, support and counseling about physical activity/exercise needs.;Develop an individualized exercise prescription for aerobic and resistive training based on initial evaluation findings, risk stratification, comorbidities and participant's personal goals.       Expected Outcomes Short Term: Attend rehab on a regular basis to increase amount of physical activity.;Long Term: Add in home exercise to make exercise part of routine and to increase amount of physical activity.;Long Term: Exercising regularly at least 3-5 days a week.       Increase Strength and Stamina Yes       Intervention Provide advice, education, support and counseling about physical activity/exercise needs.;Develop an individualized exercise prescription for aerobic and resistive training based on initial evaluation findings, risk stratification, comorbidities and participant's personal goals.       Expected Outcomes Short Term: Increase workloads from initial exercise prescription for resistance, speed, and METs.;Short Term: Perform resistance training exercises routinely during rehab and add in resistance training at home;Long Term: Improve cardiorespiratory fitness, muscular endurance and strength as measured  by increased METs and functional capacity (6MWT)       Able to understand and use rate of perceived exertion (RPE) scale Yes       Intervention Provide education and explanation on how to use RPE scale        Expected Outcomes Short Term: Able to use RPE daily in rehab to express subjective intensity level;Long Term:  Able to use RPE to guide intensity level when exercising independently       Able to understand and use Dyspnea scale Yes       Intervention Provide education and explanation on how to use Dyspnea scale       Expected Outcomes Short Term: Able to use Dyspnea scale daily in rehab to express subjective sense of shortness of breath during exertion;Long Term: Able to use Dyspnea scale to guide intensity level when exercising independently       Knowledge and understanding of Target Heart Rate Range (THRR) Yes       Intervention Provide education and explanation of THRR including how the numbers were predicted and where they are located for reference       Expected Outcomes Short Term: Able to state/look up THRR;Short Term: Able to use daily as guideline for intensity in rehab;Long Term: Able to use THRR to govern intensity when exercising independently       Able to check pulse independently Yes       Intervention Provide education and demonstration on how to check pulse in carotid and radial arteries.;Review the importance of being able to check your own pulse for safety during independent exercise       Expected Outcomes Short Term: Able to explain why pulse checking is important during independent exercise;Long Term: Able to check pulse independently and accurately       Understanding of Exercise Prescription Yes       Intervention Provide education, explanation, and written materials on patient's individual exercise prescription       Expected Outcomes Short Term: Able to explain program exercise prescription;Long Term: Able to explain home exercise prescription to exercise independently              Exercise Goals Re-Evaluation:  Exercise Goals Re-Evaluation    Row Name 10/02/19 0955 10/09/19 1727 10/23/19 1008 11/07/19 0856 11/18/19 1026     Exercise Goal Re-Evaluation    Exercise Goals Review Increase Physical Activity;Able to understand and use rate of perceived exertion (RPE) scale;Knowledge and understanding of Target Heart Rate Range (THRR);Understanding of Exercise Prescription;Increase Strength and Stamina;Able to check pulse independently Increase Physical Activity;Able to understand and use rate of perceived exertion (RPE) scale;Knowledge and understanding of Target Heart Rate Range (THRR);Understanding of Exercise Prescription;Increase Strength and Stamina;Able to check pulse independently Increase Physical Activity;Increase Strength and Stamina;Understanding of Exercise Prescription Increase Physical Activity;Increase Strength and Stamina Increase Physical Activity;Increase Strength and Stamina;Able to understand and use rate of perceived exertion (RPE) scale;Knowledge and understanding of Target Heart Rate Range (THRR);Able to check pulse independently   Comments Reviewed RPE and dyspnea scales, THR and program prescription with pt today.  Pt voiced understanding and was given a copy of goals to take home. Mecca is doing well in rehab as it has been her first week. She is adjusting well to the exercise machines. HR has been maintained well within her range. Will continue to monitor. Drusilla continues to do well in rehab.  She is up 2.3 mph on the treadmill!!  We will continue to monitor her progress.  Reino Bellis attends consistently and works in [employer] range.  She is now up to 2.5 mph on TM. Reviewed home exercise with pt today.  Pt plans to walk for exercise.  Reviewed THR, pulse, RPE, sign and symptoms, pulse oximetery and when to call 911 or MD.  Also discussed weather considerations and indoor options.  Pt voiced understanding.   Expected Outcomes Short: Use RPE daily to regulate intensity. Long: Follow program prescription in THR. Short: Continue attending rehab regularly Long: Increase overall strength/ stamina Short: Increase spm on recumbent elliptical  Long:  Continue to improve stamina Short:  continue to attend regularly Long: improve stamina and MET level Short: monitor vitals during exercise Long:  improve overall MET level   Row Name 11/20/19 1517 12/03/19 1318 12/18/19 1004         Exercise Goal Re-Evaluation   Exercise Goals Review Increase Physical Activity;Increase Strength and Stamina;Understanding of Exercise Prescription Increase Physical Activity;Increase Strength and Stamina;Understanding of Exercise Prescription Increase Physical Activity;Increase Strength and Stamina;Understanding of Exercise Prescription     Comments Reino Bellis is doing well in rehab.  She is enjoying her three days a week now.  She is up to 4.8 METs on the XR.  We will continue to montior her progress. harriet isprogessing well. She has increased speed on TM and to level 4 on XR.  Staff will encourage trying 4 lb weights. Reino Bellis is doing well in rehab and is close to graduating.  She improved her post walk test by 31.5%!!  She plans to continue to walk at home to maintain her exercise routine.     Expected Outcomes Short: Increase workload on XR.  Long; Conitnue to improve stamina. Short:use 4 lb for strength work Long: improve overall stamina Short: Graduate!  Long: Continue to exericse independently            Nutrition & Weight - Outcomes:  Pre Biometrics - 09/25/19 1205      Pre Biometrics   Height 5' 1.9" (1.572 m)    Weight 150 lb 6.4 oz (68.2 kg)    BMI (Calculated) 27.61    Single Leg Stand 3.5 seconds            Nutrition:  Nutrition Therapy & Goals - 10/08/19 0916      Nutrition Therapy   Diet Heart healthy, Low Na    Protein (specify units) 55g    Fiber 25 grams    Whole Grain Foods 3 servings    Saturated Fats 12 max. grams    Fruits and Vegetables 5 servings/day    Sodium 1.5 grams      Personal Nutrition Goals   Nutrition Goal ST: include vegetables and fruit - try roasted, try daves killer bread LT: Doesn't want to have another heart  event.    Comments B: toast with light margaine and applesauce and cinammon :L: half sandwich with [place] or ham or smart popcorn with almond milk S: fruit (plum, orange, watermelon) L: half sandwich, piece of chicken (mostly baked)  D: frozen diet dinner, 1/2 piece of baked chicken breast, half sandwich. whole grain bread. Discussed heart healthy changes.      Intervention Plan   Intervention Prescribe, educate and counsel regarding individualized specific dietary modifications aiming towards targeted core components such as weight, hypertension, lipid management, diabetes, heart failure and other comorbidities.;Nutrition handout(s) given to patient.    Expected Outcomes Short Term Goal: Understand basic principles of dietary content, such as calories, fat, sodium, cholesterol and nutrients.;Short Term Goal:  A plan has been developed with personal nutrition goals set during dietitian appointment.;Long Term Goal: Adherence to prescribed nutrition plan.           Nutrition Discharge:  Nutrition Assessments - 12/18/19 1143      MEDFICTS Scores   Post Score 7           Education Questionnaire Score:  Knowledge Questionnaire Score - 12/18/19 1144      Knowledge Questionnaire Score   Post Score 25/26           Goals reviewed with patient; copy given to patient.

## 2019-12-25 NOTE — Telephone Encounter (Signed)
Rx request sent to pharmacy.  

## 2019-12-25 NOTE — Telephone Encounter (Signed)
*  STAT* If patient is at the pharmacy, call can be transferred to refill team.   1. Which medications need to be refilled? (please list name of each medication and dose if known) losartan 25 .5 tablet daily  2. Which pharmacy/location (including street and city if local pharmacy) is medication to be sent to? [facility] in [place]  3. Do they need a 30 day or 90 day supply? 37 Going out of town today

## 2019-12-25 NOTE — Progress Notes (Signed)
Daily Session Note  Patient Details  Name: Cheryl Briggs MRN: [id number] Date of Birth: October 30, 1945 Referring Provider:   Flowsheet Row Cardiac Rehab from 09/25/2019 in [facility] Cardiac and Pulmonary Rehab  Referring Provider Cheryl Sacramento MD      Encounter Date: 12/25/2019  Check In:  Session Check In - 12/25/19 1003      Check-In   Supervising physician immediately available to respond to emergencies See telemetry face sheet for immediately available ER MD    Location [facility]-Cardiac & Pulmonary Rehab    Staff Present Cheryl Briggs, BA, ACSM CEP, Exercise Physiologist;Cheryl Darrin Nipper, MA, RCEP, CCRP, CCET;Cheryl Redmon, RN, BSN, CCRP    Virtual Visit No    Medication changes reported     No    Fall or balance concerns reported    No    Warm-up and Cool-down Performed on first and last piece of equipment    Resistance Training Performed Yes    VAD Patient? No    PAD/SET Patient? No      Pain Assessment   Currently in Pain? No/denies              Social History   Tobacco Use  Smoking Status Never Smoker  Smokeless Tobacco Never Used    Goals Met:  Independence with exercise equipment Exercise tolerated well No report of cardiac concerns or symptoms  Goals Unmet:  Not Applicable  Comments:  Cheryl Briggs graduated today from  rehab with 36 sessions completed.  Details of the patient's exercise prescription and what She needs to do in order to continue the prescription and progress were discussed with patient.  Patient was given a copy of prescription and goals.  Patient verbalized understanding.  Cheryl Briggs plans to continue to exercise by returning to exercise at the [facility].    Dr. Emily Briggs is Medical Director for [place] and [name].

## 2019-12-25 NOTE — Progress Notes (Signed)
Cardiac Individual Treatment Plan  Patient Details  Name: Cheryl Briggs MRN: [id number] Date of Birth: 1945-04-23 Referring Provider:   Flowsheet Row Cardiac Rehab from 09/25/2019 in [facility] Cardiac and Pulmonary Rehab  Referring Provider Kathlyn Sacramento MD      Initial Encounter Date:  Flowsheet Row Cardiac Rehab from 09/25/2019 in [facility] Cardiac and Pulmonary Rehab  Date 09/25/19      Visit Diagnosis: NSTEMI (non-ST elevated myocardial infarction) [facility])  Patient's Home Medications on Admission:  Current Outpatient Medications:  .  aspirin EC 81 MG EC tablet, Take 1 tablet (81 mg total) by mouth daily. Swallow whole., Disp: , Rfl:  .  atorvastatin (LIPITOR) 80 MG tablet, Take 1 tablet (80 mg total) by mouth daily., Disp: 90 tablet, Rfl: 0 .  Bioflavonoid Products (ESTER C PO), Take 1,000 mg by mouth daily., Disp: , Rfl:  .  calcium citrate-vitamin D (CITRACAL+D) 315-200 MG-UNIT tablet, Take 1 tablet by mouth 2 (two) times daily., Disp: , Rfl:  .  Chlorpheniramine Maleate (CHLOR-TABLETS PO), Take 1 tablet by mouth as needed. , Disp: , Rfl:  .  Cholecalciferol (VITAMIN D-1000 MAX ST) 25 MCG (1000 UT) tablet, Take 3,000 Units by mouth daily., Disp: , Rfl:  .  desonide (DESOWEN) 0.05 % ointment, desonide 0.05 % topical ointment, Disp: , Rfl:  .  gabapentin (NEURONTIN) 300 MG capsule, Take 300 mg by mouth 3 (three) times daily., Disp: , Rfl:  .  losartan (COZAAR) 25 MG tablet, Take 0.5 tablets (12.5 mg total) by mouth daily., Disp: 30 tablet, Rfl: 1 .  metoprolol succinate (TOPROL-XL) 50 MG 24 hr tablet, Take 1.5 tablets (75 mg total) by mouth daily., Disp: 135 tablet, Rfl: 1 .  Multiple Vitamins-Minerals (MULTIVITAMIN WITH MINERALS) tablet, Take 1 tablet by mouth daily., Disp: , Rfl:  .  Omeprazole (PRILOSEC PO), Take 1 tablet by mouth daily. , Disp: , Rfl:  .  ticagrelor (BRILINTA) 90 MG TABS tablet, Take 1 tablet (90 mg total) by mouth 2 (two) times daily., Disp: 180 tablet, Rfl: 0 .   traMADol-acetaminophen (ULTRACET) 37.5-325 MG tablet, Take 1 tablet by mouth every 6 (six) hours as needed., Disp: , Rfl:   Past Medical History: Past Medical History:  Diagnosis Date  . Allergy   . Arthritis   . 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    Tobacco Use: Social History   Tobacco Use  Smoking Status Never Smoker  Smokeless Tobacco Never Used    Labs: Recent Review Flowsheet Data    Labs for ITP Cardiac and Pulmonary Rehab Latest Ref Rng & Units 08/15/2019 09/24/2019   Cholestrol 100 - 199 mg/dL 181 139   LDLCALC 0 - 99 mg/dL 82 54   LDLDIRECT 0 - 99 mg/dL - 51   HDL >39 mg/dL 66 61   Trlycerides 0 - 149 mg/dL 164(H) 138   Hemoglobin A1c 4.8 - 5.6 % 6.0(H) -       Exercise Target Goals: Exercise Program Goal: Individual exercise prescription set using results from initial 6 min walk test and THRR while considering  patient's activity barriers and safety.   Exercise Prescription Goal: Initial exercise prescription builds to 30-45 minutes a day of aerobic activity, 2-3 days per week.  Home exercise guidelines will be given to patient during program as part of exercise prescription that the participant will acknowledge.   Education: Aerobic Exercise: - Group verbal and visual presentation on the components of exercise prescription. Introduces F.I.T.T principle from ACSM for exercise prescriptions.  Reviews F.I.T.T. principles of aerobic exercise including progression. Written material given at graduation. Flowsheet Row Cardiac Rehab from 12/25/2019 in [facility] Cardiac and Pulmonary Rehab  Date 10/30/19  Educator [facility]  Instruction Review Code 1- Verbalizes Understanding      Education: Resistance Exercise: - Group verbal and visual presentation on the components of exercise prescription. Introduces F.I.T.T principle from ACSM for exercise prescriptions  Reviews F.I.T.T. principles of resistance exercise including progression. Written material given at graduation.    Education:  Exercise & Equipment Safety: - Individual verbal instruction and demonstration of equipment use and safety with use of the equipment. Flowsheet Row Cardiac Rehab from 12/25/2019 in [facility] Cardiac and Pulmonary Rehab  Date 09/19/19  Educator [facility]  Instruction Review Code 1- Verbalizes Understanding      Education: Exercise Physiology & General Exercise Guidelines: - Group verbal and written instruction with models to review the exercise physiology of the cardiovascular system and associated critical values. Provides general exercise guidelines with specific guidelines to those with heart or lung disease.  Flowsheet Row Cardiac Rehab from 12/25/2019 in [facility] Cardiac and Pulmonary Rehab  Education need identified 09/25/19  Date 12/25/19  Educator [facility]  Instruction Review Code 1- [employer] Understanding      Education: Flexibility, Balance, Mind/Body Relaxation: - Group verbal and visual presentation with interactive activity on the components of exercise prescription. Introduces F.I.T.T principle from ACSM for exercise prescriptions. Reviews F.I.T.T. principles of flexibility and balance exercise training including progression. Also discusses the mind body connection.  Reviews various relaxation techniques to help reduce and manage stress (i.e. Deep breathing, progressive muscle relaxation, and visualization). Balance handout provided to take home. Written material given at graduation. Flowsheet Row Cardiac Rehab from 12/25/2019 in [facility] Cardiac and Pulmonary Rehab  Date 11/13/19  Educator [facility]  Instruction Review Code 1- Verbalizes Understanding      Activity Barriers & Risk Stratification:  Activity Barriers & Cardiac Risk Stratification - 09/25/19 1159      Activity Barriers & Cardiac Risk Stratification   Activity Barriers Arthritis;Back Problems;Joint Problems;Deconditioning;Muscular Weakness;Shortness of Breath;Neck/Spine Problems;Balance Concerns;Decreased Ventricular Function;Other  (comment)    Comments Bilateral shoulder surgery x2, back surgery x2, occasional hip/knee pain, arthritis all over    Cardiac Risk Stratification High           6 Minute Walk:  6 Minute Walk    Row Name 09/25/19 1159 12/16/19 1131       6 Minute Walk   Phase Initial Discharge    Distance 1095 feet 1440 feet    Distance % Change -- 13.5 %    Distance Feet Change -- 345 ft    Walk Time 6 minutes 6 minutes    # of Rest Breaks 0 0    MPH 2.07 2.7    METS 2.47 3.2    RPE 11 13    Perceived Dyspnea  1 0    VO2 Peak 8.64 11.3    Symptoms Yes (comment) No    Comments slightly SOB at end --    Resting HR 96 bpm 90 bpm    Resting BP 124/62 122/62    Resting Oxygen Saturation  95 % --    Exercise Oxygen Saturation  during 6 min walk 96 % --    Max Ex. HR 112 bpm 124 bpm    Max Ex. BP 154/74 154/64    2 Minute Post BP 126/64 --           Oxygen Initial Assessment:   Oxygen  Re-Evaluation:   Oxygen Discharge (Final Oxygen Re-Evaluation):   Initial Exercise Prescription:  Initial Exercise Prescription - 09/25/19 1200      Date of Initial Exercise RX and Referring Provider   Date 09/25/19    Referring Provider Kathlyn Sacramento MD      Treadmill   MPH 1.9    Grade 0.5    Minutes 15    METs 2.59      Recumbant Bike   Level 1    RPM 50    Watts 10    Minutes 15    METs 2.5      NuStep   Level 1    SPM 80    Minutes 15    METs 2.5      Recumbant Elliptical   Level 1    RPM 50    Minutes 15    METs 2      Prescription Details   Frequency (times per week) 2    Duration Progress to 30 minutes of continuous aerobic without signs/symptoms of physical distress      Intensity   THRR 40-80% of Max Heartrate 110-134    Ratings of Perceived Exertion 11-13    Perceived Dyspnea 0-4      Progression   Progression Continue to progress workloads to maintain intensity without signs/symptoms of physical distress.      Resistance Training   Training Prescription  Yes    Weight 3 lb           Perform Capillary Blood Glucose checks as needed.  Exercise Prescription Changes:  Exercise Prescription Changes    Row Name 09/25/19 1200 10/09/19 1700 10/23/19 1000 11/07/19 0800 11/20/19 1500     Response to Exercise   Blood Pressure (Admit) 124/62 118/62 132/64 122/64 138/64   Blood Pressure (Exercise) 154/74 132/64 152/80 138/74 168/78   Blood Pressure (Exit) 126/64 102/60 128/60 124/70 124/68   Heart Rate (Admit) 96 bpm 94 bpm 86 bpm 93 bpm 100 bpm   Heart Rate (Exercise) 112 bpm 117 bpm 130 bpm 134 bpm 133 bpm   Heart Rate (Exit) 87 bpm 88 bpm 105 bpm 99 bpm 105 bpm   Oxygen Saturation (Admit) 95 % -- -- -- --   Oxygen Saturation (Exercise) 96 % -- -- -- --   Rating of Perceived Exertion (Exercise) _0 Perceived Dyspnea (Exercise) 1 -- -- -- --   Symptoms SOB at end -- none none none   Comments walk test results first full day of exercise -- -- --   Duration -- Progress to 30 minutes of  aerobic without signs/symptoms of physical distress Continue with 30 min of aerobic exercise without signs/symptoms of physical distress. Continue with 30 min of aerobic exercise without signs/symptoms of physical distress. Continue with 30 min of aerobic exercise without signs/symptoms of physical distress.   Intensity -- -- THRR unchanged THRR unchanged THRR unchanged     Progression   Progression -- Continue to progress workloads to maintain intensity without signs/symptoms of physical distress. Continue to progress workloads to maintain intensity without signs/symptoms of physical distress. Continue to progress workloads to maintain intensity without signs/symptoms of physical distress. Continue to progress workloads to maintain intensity without signs/symptoms of physical distress.   Average METs -- -- 3.04 2.4 3.41     Resistance Training   Training Prescription -- Yes Yes Yes Yes   Weight -- 3 lb 3 lb 3 lb 3 lb   Reps --  10-15 10-15 10-15  10-15     Interval Training   Interval Training -- -- No No No     Treadmill   MPH -- 1.9 2.3 2.5 2.7   Grade -- 0.5 0.5 0.5 1   Minutes -- _0 METs -- 2.59 2.92 3.09 3.44     Recumbant Elliptical   Level -- 1 1 -- 1   Minutes -- 15 15 -- 15   METs -- 1.6 1.6 -- 2     REL-XR   Level -- -- _1 Minutes -- -- _2 METs -- -- 4.6 1.7 4.8     Home Exercise Plan   Plans to continue exercise at -- -- -- -- Home (comment)  walking   Frequency -- -- -- -- Add 2 additional days to program exercise sessions.   Initial Home Exercises Provided -- -- -- -- 11/18/19   Row Name 12/03/19 1300 12/18/19 1000           Response to Exercise   Blood Pressure (Admit) 140/80 122/62      Blood Pressure (Exercise) 162/72 154/64      Blood Pressure (Exit) 122/70 104/50      Heart Rate (Admit) 65 bpm 91 bpm      Heart Rate (Exercise) 125 bpm 124 bpm      Heart Rate (Exit) 98 bpm 88 bpm      Rating of Perceived Exertion (Exercise) 13 13      Symptoms none none      Duration Continue with 30 min of aerobic exercise without signs/symptoms of physical distress. Continue with 30 min of aerobic exercise without signs/symptoms of physical distress.      Intensity THRR unchanged THRR unchanged             Progression   Progression Continue to progress workloads to maintain intensity without signs/symptoms of physical distress. Continue to progress workloads to maintain intensity without signs/symptoms of physical distress.      Average METs 4.25 4.55             Resistance Training   Training Prescription Yes Yes      Weight 3 lb 3 lb      Reps 10-15 10-15             Interval Training   Interval Training No No             Treadmill   MPH 2.8 3      Grade 1 1      Minutes 15 15      METs 3.53 3.71             REL-XR   Level 4 1      Watts 50 --      Minutes 15 15      METs 5 5.4             Home Exercise Plan   Plans to continue exercise at Home (comment)   walking Home (comment)  walking      Frequency Add 2 additional days to program exercise sessions. Add 2 additional days to program exercise sessions.      Initial Home Exercises Provided 11/18/19 11/18/19             Exercise Comments:   Exercise Goals and Review:  Exercise Goals    Row Name 09/25/19 1204  Exercise Goals   Increase Physical Activity Yes       Intervention Provide advice, education, support and counseling about physical activity/exercise needs.;Develop an individualized exercise prescription for aerobic and resistive training based on initial evaluation findings, risk stratification, comorbidities and participant's personal goals.       Expected Outcomes Short Term: Attend rehab on a regular basis to increase amount of physical activity.;Long Term: Add in home exercise to make exercise part of routine and to increase amount of physical activity.;Long Term: Exercising regularly at least 3-5 days a week.       Increase Strength and Stamina Yes       Intervention Provide advice, education, support and counseling about physical activity/exercise needs.;Develop an individualized exercise prescription for aerobic and resistive training based on initial evaluation findings, risk stratification, comorbidities and participant's personal goals.       Expected Outcomes Short Term: Increase workloads from initial exercise prescription for resistance, speed, and METs.;Short Term: Perform resistance training exercises routinely during rehab and add in resistance training at home;Long Term: Improve cardiorespiratory fitness, muscular endurance and strength as measured by increased METs and functional capacity (6MWT)       Able to understand and use rate of perceived exertion (RPE) scale Yes       Intervention Provide education and explanation on how to use RPE scale       Expected Outcomes Short Term: Able to use RPE daily in rehab to express subjective intensity level;Long  Term:  Able to use RPE to guide intensity level when exercising independently       Able to understand and use Dyspnea scale Yes       Intervention Provide education and explanation on how to use Dyspnea scale       Expected Outcomes Short Term: Able to use Dyspnea scale daily in rehab to express subjective sense of shortness of breath during exertion;Long Term: Able to use Dyspnea scale to guide intensity level when exercising independently       Knowledge and understanding of Target Heart Rate Range (THRR) Yes       Intervention Provide education and explanation of THRR including how the numbers were predicted and where they are located for reference       Expected Outcomes Short Term: Able to state/look up THRR;Short Term: Able to use daily as guideline for intensity in rehab;Long Term: Able to use THRR to govern intensity when exercising independently       Able to check pulse independently Yes       Intervention Provide education and demonstration on how to check pulse in carotid and radial arteries.;Review the importance of being able to check your own pulse for safety during independent exercise       Expected Outcomes Short Term: Able to explain why pulse checking is important during independent exercise;Long Term: Able to check pulse independently and accurately       Understanding of Exercise Prescription Yes       Intervention Provide education, explanation, and written materials on patient's individual exercise prescription       Expected Outcomes Short Term: Able to explain program exercise prescription;Long Term: Able to explain home exercise prescription to exercise independently              Exercise Goals Re-Evaluation :  Exercise Goals Re-Evaluation    Row Name 10/02/19 0955 10/09/19 1727 10/23/19 1008 11/07/19 0856 11/18/19 1026     Exercise Goal Re-Evaluation   Exercise Goals Review Increase  Physical Activity;Able to understand and use rate of perceived exertion (RPE)  scale;Knowledge and understanding of Target Heart Rate Range (THRR);Understanding of Exercise Prescription;Increase Strength and Stamina;Able to check pulse independently Increase Physical Activity;Able to understand and use rate of perceived exertion (RPE) scale;Knowledge and understanding of Target Heart Rate Range (THRR);Understanding of Exercise Prescription;Increase Strength and Stamina;Able to check pulse independently Increase Physical Activity;Increase Strength and Stamina;Understanding of Exercise Prescription Increase Physical Activity;Increase Strength and Stamina Increase Physical Activity;Increase Strength and Stamina;Able to understand and use rate of perceived exertion (RPE) scale;Knowledge and understanding of Target Heart Rate Range (THRR);Able to check pulse independently   Comments Reviewed RPE and dyspnea scales, THR and program prescription with pt today.  Pt voiced understanding and was given a copy of goals to take home. Cheryl Briggs is doing well in rehab as it has been her first week. She is adjusting well to the exercise machines. HR has been maintained well within her range. Will continue to monitor. Cheryl Briggs continues to do well in rehab.  She is up 2.3 mph on the treadmill!!  We will continue to monitor her progress. Cheryl Briggs attends consistently and works in [employer] range.  She is now up to 2.5 mph on TM. Reviewed home exercise with pt today.  Pt plans to walk for exercise.  Reviewed THR, pulse, RPE, sign and symptoms, pulse oximetery and when to call 911 or MD.  Also discussed weather considerations and indoor options.  Pt voiced understanding.   Expected Outcomes Short: Use RPE daily to regulate intensity. Long: Follow program prescription in THR. Short: Continue attending rehab regularly Long: Increase overall strength/ stamina Short: Increase spm on recumbent elliptical  Long: Continue to improve stamina Short:  continue to attend regularly Long: improve stamina and MET level Short:  monitor vitals during exercise Long:  improve overall MET level   Row Name 11/20/19 1517 12/03/19 1318 12/18/19 1004         Exercise Goal Re-Evaluation   Exercise Goals Review Increase Physical Activity;Increase Strength and Stamina;Understanding of Exercise Prescription Increase Physical Activity;Increase Strength and Stamina;Understanding of Exercise Prescription Increase Physical Activity;Increase Strength and Stamina;Understanding of Exercise Prescription     Comments Cheryl Briggs is doing well in rehab.  She is enjoying her three days a week now.  She is up to 4.8 METs on the XR.  We will continue to montior her progress. Cheryl Briggs isprogessing well. She has increased speed on TM and to level 4 on XR.  Staff will encourage trying 4 lb weights. Cheryl Briggs is doing well in rehab and is close to graduating.  She improved her post walk test by 31.5%!!  She plans to continue to walk at home to maintain her exercise routine.     Expected Outcomes Short: Increase workload on XR.  Long; Conitnue to improve stamina. Short:use 4 lb for strength work Long: improve overall stamina Short: Graduate!  Long: Continue to exericse independently            Discharge Exercise Prescription (Final Exercise Prescription Changes):  Exercise Prescription Changes - 12/18/19 1000      Response to Exercise   Blood Pressure (Admit) 122/62    Blood Pressure (Exercise) 154/64    Blood Pressure (Exit) 104/50    Heart Rate (Admit) 91 bpm    Heart Rate (Exercise) 124 bpm    Heart Rate (Exit) 88 bpm    Rating of Perceived Exertion (Exercise) 13    Symptoms none    Duration Continue with 30 min of aerobic exercise  without signs/symptoms of physical distress.    Intensity THRR unchanged      Progression   Progression Continue to progress workloads to maintain intensity without signs/symptoms of physical distress.    Average METs 4.55      Resistance Training   Training Prescription Yes    Weight 3 lb    Reps 10-15       Interval Training   Interval Training No      Treadmill   MPH 3    Grade 1    Minutes 15    METs 3.71      REL-XR   Level 1    Minutes 15    METs 5.4      Home Exercise Plan   Plans to continue exercise at Home (comment)   walking   Frequency Add 2 additional days to program exercise sessions.    Initial Home Exercises Provided 11/18/19           Nutrition:  Target Goals: Understanding of nutrition guidelines, daily intake of sodium <1556m, cholesterol <2025m calories 30% from fat and 7% or less from saturated fats, daily to have 5 or more servings of fruits and vegetables.  Education: All About Nutrition: -Group instruction provided by verbal, written material, interactive activities, discussions, models, and posters to present general guidelines for heart healthy nutrition including fat, fiber, MyPlate, the role of sodium in heart healthy nutrition, utilization of the nutrition label, and utilization of this knowledge for meal planning. Follow up email sent as well. Written material given at graduation. [facility]  Date 11/20/19  [facility] 1- Verbalizes Understanding      Biometrics:  Pre Biometrics - 09/25/19 1205      Pre Biometrics   Height 5' 1.9" (1.572 m)    Weight 150 lb 6.4 oz (68.2 kg)    BMI (Calculated) 27.61    Single Leg Stand 3.5 seconds            Nutrition Therapy Plan and Nutrition Goals:  Nutrition Therapy & Goals - 10/08/19 0916      Nutrition Therapy   Diet Heart healthy, Low Na    Protein (specify units) 55g    Fiber 25 grams    Whole Grain Foods 3 servings    Saturated Fats 12 max. grams    Fruits and Vegetables 5 servings/day    Sodium 1.5 grams      Personal Nutrition Goals   Nutrition Goal ST: include vegetables and fruit - try roasted, try daves killer bread LT: Doesn't want to have another heart event.    Comments B: toast with  light margaine and applesauce and cinammon :L: half sandwich with tuKuwaitr ham or smart popcorn with almond milk S: fruit (plum, orange, watermelon) L: half sandwich, piece of chicken (mostly baked)  D: frozen diet dinner, 1/2 piece of baked chicken breast, half sandwich. whole grain bread. Discussed heart healthy changes.      Intervention Plan   Intervention Prescribe, educate and counsel regarding individualized specific dietary modifications aiming towards targeted core components such as weight, hypertension, lipid management, diabetes, heart failure and other comorbidities.;Nutrition handout(s) given to patient.    Expected Outcomes Short Term Goal: Understand basic principles of dietary content, such as calories, fat, sodium, cholesterol and nutrients.;Short Term Goal: A plan has been developed with personal nutrition goals set during dietitian appointment.;Long Term Goal: Adherence to prescribed nutrition plan.  Nutrition Assessments:  Nutrition Assessments - 12/18/19 1143      MEDFICTS Scores   Post Score 7          MEDIFICTS Score Key:  ?70 Need to make dietary changes   40-70 Heart Healthy Diet  ? 40 Therapeutic Level Cholesterol Diet   Picture Your Plate Scores:  <38 Unhealthy dietary pattern with much room for improvement.  41-50 Dietary pattern unlikely to meet recommendations for good health and room for improvement.  51-60 More healthful dietary pattern, with some room for improvement.   >60 Healthy dietary pattern, although there may be some specific behaviors that could be improved.    Nutrition Goals Re-Evaluation:  Nutrition Goals Re-Evaluation    [place] Name 11/18/19 1024 12/18/19 1021           Goals   Current Weight 144 lb (65.3 kg) 144 lb (65.3 kg)      Nutrition Goal Cheryl Briggs is trying to get more fruits and vegetables daily.  She is watching her sodium. ST: continue to add in more plant foods LT: continue with healthy eating.       Comment -- She reports still trying to get in more vegetables and fruit. Discussed some ways to include more vegetables without filling up too much such as eating cooked vegetables, snacks, and beans which are a protein food and vegetable. She will have 3 fruits and 1-2 vegetables per day.  She reports continuing to limit her sodium. She feels she is doing well overall. She doesn't forsee any barriers to healthy eating. Continue healthy eating after graduation.      Expected Outcome -- ST: continue to add in more plant foods LT: continue with healthy eating.             Nutrition Goals Discharge (Final Nutrition Goals Re-Evaluation):  Nutrition Goals Re-Evaluation - 12/18/19 1021      Goals   Current Weight 144 lb (65.3 kg)    Nutrition Goal ST: continue to add in more plant foods LT: continue with healthy eating.    Comment She reports still trying to get in more vegetables and fruit. Discussed some ways to include more vegetables without filling up too much such as eating cooked vegetables, snacks, and beans which are a protein food and vegetable. She will have 3 fruits and 1-2 vegetables per day.  She reports continuing to limit her sodium. She feels she is doing well overall. She doesn't forsee any barriers to healthy eating. Continue healthy eating after graduation.    Expected Outcome ST: continue to add in more plant foods LT: continue with healthy eating.           Psychosocial: Target Goals: Acknowledge presence or absence of significant depression and/or stress, maximize coping skills, provide positive support system. Participant is able to verbalize types and ability to use techniques and skills needed for reducing stress and depression.   Education: Stress, Anxiety, and Depression - Group verbal and visual presentation to define topics covered.  Reviews how body is impacted by stress, anxiety, and depression.  Also discusses healthy ways to reduce stress and to treat/manage  anxiety and depression.  Written material given at graduation. Flowsheet Row Cardiac Rehab from 12/25/2019 in [facility] Cardiac [facility] Pulmonary Rehab  Date 10/16/19  Educator [facility]  Instruction Review Code 1- [employer] Understanding      Education: Sleep Hygiene -Provides group verbal and written instruction about how sleep can affect your health.  Define sleep hygiene, discuss sleep cycles  and impact of sleep habits. Review good sleep hygiene tips.    Initial Review & Psychosocial Screening:  Initial Psych Review & Screening - 09/19/19 1038      Initial Review   Current issues with None Identified      Family Dynamics   Good Support System? Yes    Comments She can look to her daughter for support with whome she lives with.She has a positive outlook on her health and overall mental state.      Barriers   Psychosocial barriers to participate in program The patient should benefit from training in stress management and relaxation.;There are no identifiable barriers or psychosocial needs.      Screening Interventions   Interventions Encouraged to exercise;To provide support and resources with identified psychosocial needs;Provide feedback about the scores to participant    Expected Outcomes Short Term goal: Utilizing psychosocial counselor, staff and physician to assist with identification of specific Stressors or current issues interfering with healing process. Setting desired goal for each stressor or current issue identified.;Long Term Goal: Stressors or current issues are controlled or eliminated.;Short Term goal: Identification and review with participant of any Quality of Life or Depression concerns found by scoring the questionnaire.;Long Term goal: The participant improves quality of Life and PHQ9 Scores as seen by post scores and/or verbalization of changes           Quality of Life Scores:   Quality of Life - 12/18/19 1140      Quality of Life Scores   Health/Function Pre 27.67 %     Health/Function Post 26.53 %    Health/Function % Change -4.12 %    Socioeconomic Pre 30 %    Socioeconomic Post 30 %    Socioeconomic % Change  0 %    Psych/Spiritual Pre 30 %    Psych/Spiritual Post 30 %    Psych/Spiritual % Change 0 %    Family Pre 27 %    Family Post 24.38 %    Family % Change -9.7 %    GLOBAL Pre 28.53 %    GLOBAL Post 27.67 %    GLOBAL % Change -3.01 %          Scores of 19 and below usually indicate a poorer quality of life in these areas.  A difference of  2-3 points is a clinically meaningful difference.  A difference of 2-3 points in the total score of the Quality of Life Index has been associated with significant improvement in overall quality of life, self-image, physical symptoms, and general health in studies assessing change in quality of life.  PHQ-9: Recent Review Flowsheet Data    Depression screen [facility] 2/9 12/18/2019 09/25/2019   Decreased Interest 0 0   Down, Depressed, Hopeless 0 0   PHQ - 2 Score 0 0   Altered sleeping 1 0   Tired, decreased energy 1 1   Change in appetite 0 1   Feeling bad or failure about yourself  0 0   Trouble concentrating 0 0   Moving slowly or fidgety/restless 0 0   Suicidal thoughts 0 0   PHQ-9 Score 2 2   Difficult doing work/chores Not difficult at all Not difficult at all     Interpretation of Total Score  Total Score Depression Severity:  1-4 = Minimal depression, 5-9 = Mild depression, 10-14 = Moderate depression, 15-19 = Moderately severe depression, 20-27 = Severe depression   Psychosocial Evaluation and Intervention:  Psychosocial Evaluation - 09/19/19  1040      Psychosocial Evaluation & Interventions   Interventions Encouraged to exercise with the program and follow exercise prescription    Comments She can look to her daughter for support with whome she lives with.She has a positive outlook on her health and overall mental state.    Expected Outcomes Short: Exercise regularly to support mental  health and notify staff of any changes. Long: maintain mental health and well being through teaching of rehab or prescribed medications independently.    Continue Psychosocial Services  Follow up required by counselor           Psychosocial Re-Evaluation:  Psychosocial Re-Evaluation    [place] Name 11/18/19 1007 12/18/19 1015           Psychosocial Re-Evaluation   Current issues with Current Stress Concerns;Current Sleep Concerns Current Sleep Concerns      Comments Cheryl Briggs reports no stress.  She does have trouble staying asleep.  This has just started in the past week.   Staff recommended she talk to her Dr if it continues.  We reviewed not using caffeine late in the day and/or screens. She reports having trouble getting to sleep - sleeps about 5 hours a night. Discussed sleep hygiene - she reports doing these habits and has been sleeping this way for a while. She reports having no current stress concerns at this time - she likes to walk to relax her. She reports having a good support system in her daughter who lives close.      Expected Outcomes Short:  try deep breathing or reading to help sleep patterns Long:  maintian positive outlook Short:  continue sleep hygiene to help improve sleep Long:  maintian positive outlook      Interventions -- Encouraged to attend Cardiac Rehabilitation for the exercise;Relaxation education      Continue Psychosocial Services  -- No Follow up required  Graduating             Psychosocial Discharge (Final Psychosocial Re-Evaluation):  Psychosocial Re-Evaluation - 12/18/19 1015      Psychosocial Re-Evaluation   Current issues with Current Sleep Concerns    Comments She reports having trouble getting to sleep - sleeps about 5 hours a night. Discussed sleep hygiene - she reports doing these habits and has been sleeping this way for a while. She reports having no current stress concerns at this time - she likes to walk to relax her. She reports having a good  support system in her daughter who lives close.    Expected Outcomes Short:  continue sleep hygiene to help improve sleep Long:  maintian positive outlook    Interventions Encouraged to attend Cardiac Rehabilitation for the exercise;Relaxation education    Continue Psychosocial Services  No Follow up required   Graduating          Vocational Rehabilitation: Provide vocational rehab assistance to qualifying candidates.   Vocational Rehab Evaluation & Intervention:   Education: Education Goals: Education classes will be provided on a variety of topics geared toward better understanding of heart health and risk factor modification. Participant will state understanding/return demonstration of topics presented as noted by education test scores.  Learning Barriers/Preferences:  Learning Barriers/Preferences - 09/19/19 1037      Learning Barriers/Preferences   Learning Barriers None    Learning Preferences None           General Cardiac Education Topics:  AED/CPR: - Group verbal and written instruction with the use of models to  demonstrate the basic use of the AED with the basic ABC's of resuscitation.   Anatomy and Cardiac Procedures: - Group verbal and visual presentation and models provide information about basic cardiac anatomy and function. Reviews the testing methods done to diagnose heart disease and the outcomes of the test results. Describes the treatment choices: Medical Management, Angioplasty, or Coronary Bypass Surgery for treating various heart conditions including Myocardial Infarction, Angina, Valve Disease, and Cardiac Arrhythmias.  Written material given at graduation. Flowsheet Row Cardiac Rehab from 12/25/2019 in [facility] Cardiac and Pulmonary Rehab  Date 09/25/19  Instruction Review Code 3- Needs Reinforcement  [need identified]      Medication Safety: - Group verbal and visual instruction to review commonly prescribed medications for heart and lung disease.  Reviews the medication, class of the drug, and side effects. Includes the steps to properly store meds and maintain the prescription regimen.  Written material given at graduation.   Intimacy: - Group verbal instruction through game format to discuss how heart and lung disease can affect sexual intimacy. Written material given at graduation.. Flowsheet Row Cardiac Rehab from 12/25/2019 in [facility] Cardiac and Pulmonary Rehab  Date 10/30/19  Educator [facility]  Instruction Review Code 1- Verbalizes Understanding      Know Your Numbers and Heart Failure: - Group verbal and visual instruction to discuss disease risk factors for cardiac and pulmonary disease and treatment options.  Reviews associated critical values for Overweight/Obesity, Hypertension, Cholesterol, and Diabetes.  Discusses basics of heart failure: signs/symptoms and treatments.  Introduces Heart Failure Zone chart for action plan for heart failure.  Written material given at graduation. Flowsheet Row Cardiac Rehab from 12/25/2019 in [facility] Cardiac and Pulmonary Rehab  Date 12/04/19  Educator SB  Instruction Review Code 1- Verbalizes Understanding      Infection Prevention: - Provides verbal and written material to individual with discussion of infection control including proper hand washing and proper equipment cleaning during exercise session. Flowsheet Row Cardiac Rehab from 12/25/2019 in [facility] Cardiac and Pulmonary Rehab  Date 09/19/19  Educator [facility]  Instruction Review Code 1- Verbalizes Understanding      Falls Prevention: - Provides verbal and written material to individual with discussion of falls prevention and safety. Flowsheet Row Cardiac Rehab from 12/25/2019 in [facility] Cardiac and Pulmonary Rehab  Date 09/19/19  Educator [facility]  Instruction Review Code 1- Verbalizes Understanding      Other: -Provides group and verbal instruction on various topics (see comments)   Knowledge Questionnaire Score:  Knowledge Questionnaire  Score - 12/18/19 1144      Knowledge Questionnaire Score   Post Score 25/26           Core Components/Risk Factors/Patient Goals at Admission:  Personal Goals and Risk Factors at Admission - 09/25/19 1206      Core Components/Risk Factors/Patient Goals on Admission    Weight Management Yes;Weight Loss    Intervention Weight Management: Develop a combined nutrition and exercise program designed to reach desired caloric intake, while maintaining appropriate intake of nutrient and fiber, sodium and fats, and appropriate energy expenditure required for the weight goal.;Weight Management: Provide education and appropriate resources to help participant work on and attain dietary goals.    Admit Weight 150 lb 6.4 oz (68.2 kg)    Goal Weight: Short Term 145 lb (65.8 kg)    Goal Weight: Long Term 140 lb (63.5 kg)    Expected Outcomes Short Term: Continue to assess and modify interventions until short term weight is achieved;Long Term:  Adherence to nutrition and physical activity/exercise program aimed toward attainment of established weight goal;Understanding recommendations for meals to include 15-35% energy as protein, 25-35% energy from fat, 35-60% energy from carbohydrates, less than 253m of dietary cholesterol, 20-35 gm of total fiber daily;Understanding of distribution of calorie intake throughout the day with the consumption of 4-5 meals/snacks;Weight Loss: Understanding of general recommendations for a balanced deficit meal plan, which promotes 1-2 lb weight loss per week and includes a negative energy balance of [phone] kcal/d    Heart Failure Yes    Intervention Provide a combined exercise and nutrition program that is supplemented with education, support and counseling about heart failure. Directed toward relieving symptoms such as shortness of breath, decreased exercise tolerance, and extremity edema.    Expected Outcomes Improve functional capacity of life;Short term: Attendance in program  2-3 days a week with increased exercise capacity. Reported lower sodium intake. Reported increased fruit and vegetable intake. Reports medication compliance.;Short term: Daily weights obtained and reported for increase. Utilizing diuretic protocols set by physician.;Long term: Adoption of self-care skills and reduction of barriers for early signs and symptoms recognition and intervention leading to self-care maintenance.    Hypertension Yes    Intervention Provide education on lifestyle modifcations including regular physical activity/exercise, weight management, moderate sodium restriction and increased consumption of fresh fruit, vegetables, and low fat dairy, alcohol moderation, and smoking cessation.;Monitor prescription use compliance.    Expected Outcomes Long Term: Maintenance of blood pressure at goal levels.;Short Term: Continued assessment and intervention until BP is < 140/995mHG in hypertensive participants. < 130/8059mG in hypertensive participants with diabetes, heart failure or chronic kidney disease.    Lipids Yes    Intervention Provide education and support for participant on nutrition & aerobic/resistive exercise along with prescribed medications to achieve LDL <1m61mDL >40mg38m Expected Outcomes Short Term: Participant states understanding of desired cholesterol values and is compliant with medications prescribed. Participant is following exercise prescription and nutrition guidelines.;Long Term: Cholesterol controlled with medications as prescribed, with individualized exercise RX and with personalized nutrition plan. Value goals: LDL < 1mg,67m > 40 mg.           Education:Diabetes - Individual verbal and written instruction to review signs/symptoms of diabetes, desired ranges of glucose level fasting, after meals and with exercise. Acknowledge that pre and post exercise glucose checks will be done for 3 sessions at entry of program.   Core Components/Risk Factors/Patient  Goals Review:   Goals and Risk Factor Review    Row Name 11/18/19 1004 12/18/19 1025           Core Components/Risk Factors/Patient Goals Review   Personal Goals Review Weight Management/Obesity;Hypertension;Lipids Weight Management/Obesity;Hypertension;Lipids      Review HarrieReino Bellissing weight a little at a time.  She checks her BP at home sometimes.  Staff recommended she check it on days not at [facility].  [facility] Cheryl Briggs continues to lose weight slowly. She checks her BP at home when she remembers - encouraged to check daily - especially when she leaves rehab. ~130/70 when she comes into rehab - today was a bit higher at 138/68. She reports taking all her medications as directed.      Expected Outcomes Short:  continue current healthy eating plan Long: manage risk factors on her own Short:  continue current healthy eating plan and exercise, take BP at home Long: manage risk factors on her own after graduating  Core Components/Risk Factors/Patient Goals at Discharge (Final Review):   Goals and Risk Factor Review - 12/18/19 1025      Core Components/Risk Factors/Patient Goals Review   Personal Goals Review Weight Management/Obesity;Hypertension;Lipids    Review Cheryl Briggs continues to lose weight slowly. She checks her BP at home when she remembers - encouraged to check daily - especially when she leaves rehab. ~130/70 when she comes into rehab - today was a bit higher at 138/68. She reports taking all her medications as directed.    Expected Outcomes Short:  continue current healthy eating plan and exercise, take BP at home Long: manage risk factors on her own after graduating           ITP Comments:  ITP Comments    Row Name 09/19/19 1036 09/25/19 1159 10/02/19 0955 10/08/19 1006 10/16/19 0554   ITP Comments Virtual Visit completed. Patient informed on EP and RD appointment and 6 Minute walk test. Patient also informed of patient health  questionnaires on My Chart. Patient Verbalizes understanding. Visit diagnosis [facility] be found in [facility] 08[facility]15/[facility]. Completed 6MWT and gym orientation. Initial ITP created and sent for review to Dr. Emily Filbert, Medical Director. First full day of exercise!  Patient was oriented to gym and equipment including functions, settings, policies, and procedures.  Patient's individual exercise prescription and treatment plan were reviewed.  All starting workloads were established based on the results of the 6 minute walk test done at initial orientation visit.  The plan for exercise progression was also introduced and progression will be customized based on patient's performance and goals. Completed Initial RD Evaluation 30 Day review completed. Medical Director ITP review done, changes made as directed, and signed approval by Medical Director.   [place] Name 11/13/19 [phone] 12/11/19 0958 12/25/19 1006       ITP Comments 30 Day review completed. Medical Director ITP review done, changes made as directed, and signed approval by Medical Director. 30 Day review completed. Medical Director ITP review done, changes made as directed, and signed approval by Medical Director. Vernisha graduated today from  rehab with 36 sessions completed.  Details of the patient's exercise prescription and what She needs to do in order to continue the prescription and progress were discussed with patient.  Patient was given a copy of prescription and goals.  Patient verbalized understanding.  Lavonn plans to continue to exercise by returning to exercise at the [facility].            Comments: ITP DISCHARGE

## 2019-12-25 NOTE — Progress Notes (Signed)
Discharge Progress Report  Patient Details  Name: Cheryl Briggs MRN: [id number] Date of Birth: [birth year] Referring Provider:   Flowsheet Row Cardiac Rehab from 09/25/2019 in [facility] Cardiac and Pulmonary Rehab  Referring Provider Kathlyn Sacramento MD       Number of Visits: 36  Reason for Discharge:  Patient reached a stable level of exercise. Patient independent in their exercise.  Smoking History:  Social History   Tobacco Use  Smoking Status Never Smoker  Smokeless Tobacco Never Used    Diagnosis:  NSTEMI (non-ST elevated myocardial infarction) [facility])  Initial Exercise Prescription:  Initial Exercise Prescription - 09/25/19 1200      Date of Initial Exercise RX and Referring Provider   Date 09/25/19    Referring Provider Kathlyn Sacramento MD      Treadmill   MPH 1.9    Grade 0.5    Minutes 15    METs 2.59      Recumbant Bike   Level 1    RPM 50    Watts 10    Minutes 15    METs 2.5      NuStep   Level 1    SPM 80    Minutes 15    METs 2.5      Recumbant Elliptical   Level 1    RPM 50    Minutes 15    METs 2      Prescription Details   Frequency (times per week) 2    Duration Progress to 30 minutes of continuous aerobic without signs/symptoms of physical distress      Intensity   THRR 40-80% of Max Heartrate 110-134    Ratings of Perceived Exertion 11-13    Perceived Dyspnea 0-4      Progression   Progression Continue to progress workloads to maintain intensity without signs/symptoms of physical distress.      Resistance Training   Training Prescription Yes    Weight 3 lb           Discharge Exercise Prescription (Final Exercise Prescription Changes):  Exercise Prescription Changes - 12/18/19 1000      Response to Exercise   Blood Pressure (Admit) 122/62    Blood Pressure (Exercise) 154/64    Blood Pressure (Exit) 104/50    Heart Rate (Admit) 91 bpm    Heart Rate (Exercise) 124 bpm    Heart Rate (Exit) 88 bpm    Rating of  Perceived Exertion (Exercise) 13    Symptoms none    Duration Continue with 30 min of aerobic exercise without signs/symptoms of physical distress.    Intensity THRR unchanged      Progression   Progression Continue to progress workloads to maintain intensity without signs/symptoms of physical distress.    Average METs 4.55      Resistance Training   Training Prescription Yes    Weight 3 lb    Reps 10-15      Interval Training   Interval Training No      Treadmill   MPH 3    Grade 1    Minutes 15    METs 3.71      REL-XR   Level 1    Minutes 15    METs 5.4      Home Exercise Plan   Plans to continue exercise at Home (comment)   walking   Frequency Add 2 additional days to program exercise sessions.    Initial Home Exercises Provided 11/18/19  Functional Capacity:  6 Minute Walk    Row Name 09/25/19 1159 12/16/19 1131       6 Minute Walk   Phase Initial Discharge    Distance 1095 feet 1440 feet    Distance % Change -- 13.5 %    Distance Feet Change -- 345 ft    Walk Time 6 minutes 6 minutes    # of Rest Breaks 0 0    MPH 2.07 2.7    METS 2.47 3.2    RPE 11 13    Perceived Dyspnea  1 0    VO2 Peak 8.64 11.3    Symptoms Yes (comment) No    Comments slightly SOB at end --    Resting HR 96 bpm 90 bpm    Resting BP 124/62 122/62    Resting Oxygen Saturation  95 % --    Exercise Oxygen Saturation  during 6 min walk 96 % --    Max Ex. HR 112 bpm 124 bpm    Max Ex. BP 154/74 154/64    2 Minute Post BP 126/64 --           Nutrition & Weight - Outcomes:  Pre Biometrics - 09/25/19 1205      Pre Biometrics   Height 5' 1.9" (1.572 m)    Weight 150 lb 6.4 oz (68.2 kg)    BMI (Calculated) 27.61    Single Leg Stand 3.5 seconds            Nutrition:  Nutrition Therapy & Goals - 10/08/19 0916      Nutrition Therapy   Diet Heart healthy, Low Na    Protein (specify units) 55g    Fiber 25 grams    Whole Grain Foods 3 servings    Saturated  Fats 12 max. grams    Fruits and Vegetables 5 servings/day    Sodium 1.5 grams      Personal Nutrition Goals   Nutrition Goal ST: include vegetables and fruit - try roasted, try daves killer bread LT: Doesn't want to have another heart event.    Comments B: toast with light margaine and applesauce and cinammon :L: half sandwich with [place] or ham or smart popcorn with almond milk S: fruit (plum, orange, watermelon) L: half sandwich, piece of chicken (mostly baked)  D: frozen diet dinner, 1/2 piece of baked chicken breast, half sandwich. whole grain bread. Discussed heart healthy changes.      Intervention Plan   Intervention Prescribe, educate and counsel regarding individualized specific dietary modifications aiming towards targeted core components such as weight, hypertension, lipid management, diabetes, heart failure and other comorbidities.;Nutrition handout(s) given to patient.    Expected Outcomes Short Term Goal: Understand basic principles of dietary content, such as calories, fat, sodium, cholesterol and nutrients.;Short Term Goal: A plan has been developed with personal nutrition goals set during dietitian appointment.;Long Term Goal: Adherence to prescribed nutrition plan.           Goals reviewed with patient; copy given to patient.

## 2020-01-23 NOTE — Progress Notes (Unsigned)
Office Visit    Patient Name: Cheryl Briggs Date of Encounter: 01/24/2020  Primary Care Provider:  Joyice Faster, [place] Primary Cardiologist: Dr. Fletcher Anon  Chief Complaint    Chief Complaint  Patient presents with  . Follow-up    3 Months follow up. Medications verbally reviewed with patient.     75 y.o. female with a hx of CLL, arthritis, secondhand smoke exposure, admission 08/2019 with non-ST elevation myocardial infarction with subsequent LHC 8/2021with significant 3v CAD and complicated/staged PCI (0/8/13 dRCA, 08/19/19 dLCx), PCI on 8/6 complicated by no reflow and bradycardia/hypotension/atrial fibrillation with RVR, post procedure right forearm hematoma, HFpEF, hypertension, mitral regurgitation, hyperlipidemia, and here for follow-up since starting cardiac rehab.   Past Medical History    Past Medical History:  Diagnosis Date  . Allergy   . Arthritis   . Cancer ([place])    CLL   Past Surgical History:  Procedure Laterality Date  . BACK SURGERY    . BLADDER REPAIR    . carpel tunnell Bilateral   . CHOLECYSTECTOMY    . COLONOSCOPY  2015  . CORONARY STENT INTERVENTION N/A 08/16/2019   Procedure: CORONARY STENT INTERVENTION;  Surgeon: Nelva Bush, MD;  Location: [facility];  Service: Cardiovascular;  Laterality: N/A;  . CORONARY STENT INTERVENTION Left 08/19/2019   Procedure: CORONARY STENT INTERVENTION;  Surgeon: Wellington Hampshire, MD;  Location: [facility];  Service: Cardiovascular;  Laterality: Left;  . LEFT HEART CATH AND CORONARY ANGIOGRAPHY N/A 08/16/2019   Procedure: LEFT HEART CATH AND CORONARY ANGIOGRAPHY;  Surgeon: Nelva Bush, MD;  Location: [facility];  Service: Cardiovascular;  Laterality: N/A;  . SHOULDER SURGERY Bilateral   . UPPER EXTREMITY ANGIOGRAPHY Right 08/21/2019   Procedure: UPPER EXTREMITY ANGIOGRAPH;  Surgeon: Algernon Huxley, MD;  Location: [facility];  Service: Cardiovascular;  Laterality: Right;     Allergies  Allergies  Allergen Reactions  . Corticosteroids     Other reaction(s): Headache  . Other Other (See Comments)  . Sulfa Antibiotics Anaphylaxis  . Clarithromycin     Other reaction(s): Other (See Comments) Rectal bleeding   . Cymbalta [Duloxetine Hcl] Diarrhea  . Prednisone Other (See Comments)    Color changes in face, headache and difficulty walking    History of Present Illness    Cheryl Briggs is a 75 y.o. female with PMH as above. Cheryl Briggs is a 75 yo female with recent 08/2019 admission with newly diagnosed CAD and s/p PCI.    Prior to this admission, she had no known history of heart dz. She reported exposure to secondhand smoke for approximately 20 years via her husband. She did not smoke herself. No previously known history of hypertension.  No alcohol or drug use.  She had intermittent LEE and usually walked in the afternoons to try and stay active, though did note some decreased exercise tolerance. Then, for the 2 weeks leading up to admission, she reported avoiding her normal afternoon walks due to chest discomfort.  This CP eventually progressed to also include discomfort at rest.    The morning of her admission, she felt 10/10 chest pain and pressure in her center of her chest with associated shortness of breath, diaphoresis, nausea, emesis, and dizziness.  She also felt weakness and fatigue. She was uncertain if she had racing heart rate or palpitations.  She had nausea and emesis x1.  This was the most severe in intensity episode and concerning to her with  subsequent presentation to the ED and admission. In the [facility] ED, labs significant for HS Tn 171 with peak at 341. EKG NSR, 71 bpm, T wave inversion in inferior and anterior leads, low voltage QRS, poor R wave progression in precordial leads, QTC 452.  CXR without active dz. Cardiology was consulted and pt scheduled for LHC.   8/5 showed EF 55-60% with no RWMA and mild LVH. Also noted was mild MR  with mild to moderate aortic valve sclerosis / calcification without evidence of stenosis.  Left heart cath was performed 08/16/2019 and showed severe multivessel CAD, including 80% D2 lesion, sequential 60% proximal and 80% distal left circumflex stenosis, chronic total occlusion of OM1, 50 to 60% mRCA stenosis, and thrombotic 99% dRCA lesion extending into the rPDA.  She had moderately elevated LV filling pressures.  Complex PCI was performed to the dRCA, rPDA, and rPL using DES extending from the dRCA into the rPDA, as well as angioplasty performed of the ostial rPL branch.  Distal RCA PCI was complicated by no reflow in the distal branches.  She subsequently had transient bradycardia and hypotension with development of atrial fibrillation with RVR.  Amiodarone infusion was initiated.  Final angiogram showed 0% stenosis in the distal RCA/RPDA and 10% residual stenosis of the ostial RPL. It was thought that the culprit of her non-ST elevation myocardial infarction was the thrombotic lesion of the distal RCA.  After catheterization, she was transferred to the ICU for ongoing management and continued on tirofiban infusion for 18 hours.  DAPT with ASA and ticagrelor was recommended for 12 months.  Relook catheterization with likely PCI to left circumflex was scheduled.  Limited echo to reevaluate LVEF following complex PCI with transient no flow involving the distal RCA was recommended.  Aggressive secondary prevention and escalation of statin therapy to atorvastatin 80 mg was also recommended. It was thought that her atrial fibrillation was 2/2 complicated PCI and mediated by acute ischemia, as well as administration of atropine and norepinephrine.  Norepinephrine was turned off.  She was continued on amiodarone infusion and metoprolol tartrate 25 mg every 6 hours.  Given her elevated filling pressures, she was started on IV Lasix.  Repeat echo 08/17/2019 showed EF 60 to 65%, no regional wall motion abnormalities,  mild concentric left ventricular hypertrophy, G1 DD, elevated LV end-diastolic filling pressure.  Trivial MR was noted.  No aortic regurgitation or stenosis was noted.  She underwent repeat intervention 08/19/2019.  It was noted she had a widely patent RCA stent with normal flow distally.  Unchanged left CAD.  Mildly elevated left ventricular end-diastolic pressure was measured at 14 mmHg.  Successful direct stenting was performed with the distal left circumflex.  Post procedure, she experienced a right forearm hematoma with recommendation for conservative management.  After discharge, she was seen in clinic 09/02/2019 and noted she was overall doing well; however, she noted concern regarding continued fatigue and dyspnea since her intervention.  She had not yet started cardiac rehab with orders placed.  No chest pain, racing heart rate, palpitations.  She reported significant improvement in her right arm hematoma.  Right femoral arteriotomy site without signs of erythema, warmth, infection, or hematoma.  She was very satisfied with the care that she received at [facility] and eager to express her thanks to the entire [facility] team and hospital team involved in her care.  She intended to get the shingles vaccine which was relayed to be okay from a cardiac standpoint.  On 09/24/2019, she  continued to note dyspnea and fatigue since her intervention.  She had not yet started cardiac rehab but scheduled to start 9/15. She was taking a nap in the a.m. and afternoon, which was unusual for her.  She had dyspnea with walking long distances but did not experience any dyspnea on her walk into the office.  She was drinking 2 to 3 glasses of water daily in a 32 ounce cup.  She had resolution of her right arm hematoma.  Right radial and right femoral arteriotomy sites healed.  She has been tolerating the losartan well with blood pressure 130/72 in the setting of joint pain.  She noted SBP 140s and to what extent her pain was  contributing to these pressures.  She intended to start tai chi from a chair, consisting of slow stretching movements.  She reports that, since discontinuing her Voltaren gel, she had continued to experience joint pain.  She had tried several over-the-counter and prescribed pain relief options, including recently prescribed tramadol-acetaminophen 37.5 every 6 hours as needed.  She was going to get the flu vaccine.   On 10/24/2019 she noted improvement in dyspnea and fatigue since starting cardiac rehab.  She felt improvement in her symptoms when walking, stating that before she used to have to stop periodically when walking.  She was using the elliptical at cardiac rehab.  She had tried Biofreeze, as recommended at past visits, and reported relief.  She still had joint pain on the right side of her back. She had received her Covid booster 10/13 and hopeful to soon receive her flu shot.  She reported atypical chest pain on the left side of her chest early in the morning, described as sharp, brief, and different from that before her PCI as outlined above.  She also felt this on the treadmill once.  Today, 01/24/2020, she returns to clinic and notes feeling significantly improved after completion of cardiac rehab.  She reports this helped resolve her previous fatigue and dyspnea. She is very satisfied with the cardiac rehab experience, reporting that the staff was very willing to work around her limitations due to her pain. She definitely noticed a significant increase in exercise tolerance. She continues to try and remain active. She does note she had a break in her regular workouts with the holiday but plans to restart her exercise routine now. Specifically, she plans to walk on the treadmill at the senior center. She can walk as quickly as 3 miles per hour / maintain a decent pace for some time before she tires out. She continues to note joint pain as in the past with new plan for tramadol and gabapentin for  additional relief as noted in her med list (and to avoid NSAIDs).  She reports staying safe in the setting of COVID-19. She spent the holiday spent in [place] with family, but states she was very safe with masking during travel. She is monitoring her BP at home with log from home showing SBP very well controlled and typically ranging from 100s to 120s with one SBP outlier in the 130s.  HR well controlled as well. She continues to note atypical sharp chest pain, though improved in frequency. It continues to occur mainly in the morning and at rest and with exertion without other clear triggers.  She denies any racing heart rate or palpitations.  No presyncope or syncope.  No recent falls.  No signs or symptoms of volume overload.  She reports medication compliance.  No signs or symptoms  of bleeding.  She is overall very satisfied with her treatment and recovery.    Home Medications    Prior to Admission medications   Medication Sig Start Date End Date Taking? Authorizing Provider  aspirin EC 81 MG EC tablet Take 1 tablet (81 mg total) by mouth daily. Swallow whole. 08/19/19  Yes Jennye Boroughs, MD  atorvastatin (LIPITOR) 80 MG tablet Take 1 tablet (80 mg total) by mouth daily. 08/19/19  Yes Jennye Boroughs, MD  Bioflavonoid Products (ESTER C PO) Take 1,000 mg by mouth daily.   Yes [provider]  calcium citrate-vitamin D (CITRACAL+D) 315-200 MG-UNIT tablet Take 1 tablet by mouth 2 (two) times daily.   Yes [provider]  Chlorpheniramine Maleate (CHLOR-TABLETS PO) Take 1 tablet by mouth as needed.    Yes [provider]  Cholecalciferol (VITAMIN D-1000 MAX ST) 25 MCG (1000 UT) tablet Take 3,000 Units by mouth daily.   Yes [provider]  desonide (DESOWEN) 0.05 % ointment desonide 0.05 % topical ointment 02/21/17  Yes [provider]  gabapentin (NEURONTIN) 300 MG capsule Take 300 mg by mouth 3 (three) times daily. 08/26/19  Yes [provider]   metoprolol succinate (TOPROL-XL) 50 MG 24 hr tablet Take 1.5 tablets (75 mg total) by mouth daily. 08/20/19  Yes Jennye Boroughs, MD  Multiple Vitamins-Minerals (MULTIVITAMIN WITH MINERALS) tablet Take 1 tablet by mouth daily.   Yes [provider]  Omeprazole (PRILOSEC PO) Take 1 tablet by mouth daily.    Yes [provider]  ticagrelor (BRILINTA) 90 MG TABS tablet Take 1 tablet (90 mg total) by mouth 2 (two) times daily. 08/19/19 08/18/20 Yes Jennye Boroughs, MD    Review of Systems    She reports resolution in previously reported fatigue and dyspnea since completing cardiac rehab. She reports less frequent previously reported atypical chest pain as described above that is sharp, brief, and without clear triggers/both exertional and nonexertional.  No palpitations, pnd, orthopnea, n, v, dizziness, syncope, weight gain, or early satiety.  She reports joint pain, and that she has started on tramadol and gabapentin. All other systems reviewed and are otherwise negative except as noted above.  Physical Exam    VS:  BP 118/60 (BP Location: Left Arm, Patient Position: Sitting, Cuff Size: Normal)   Pulse 82   Ht '5\' 2"'  (1.575 m)   Wt 137 lb (62.1 kg)   SpO2 97%   BMI 25.06 kg/m  , BMI Body mass index is 25.06 kg/m. GEN: Well nourished, well developed, in no acute distress. HEENT: normal. Neck: Supple, no JVD, carotid bruits, or masses. Cardiac: RRR, 1/6 systolic murmurs, rubs, or gallops. No clubbing, cyanosis, edema.  Radials/DP/PT 2+ and equal bilaterally.     Respiratory:  Respirations regular and unlabored, clear to auscultation bilaterally. GI: Soft, nontender, nondistended, BS + x 4. MS: no deformity or atrophy. Skin: warm and dry, no rash. Neuro:  Strength and sensation are intact. Psych: Normal affect.  Accessory Clinical Findings    ECG personally reviewed by me today -No EKG- no acute changes.  VITALS Reviewed today   Temp Readings from Last 3 Encounters:   08/22/19 97.8 F (36.6 C) (Oral)  04/29/19 (!) 97.5 F (36.4 C) (Tympanic)  02/28/18 97.7 F (36.5 C) (Oral)   BP Readings from Last 3 Encounters:  01/24/20 118/60  10/24/19 126/64  09/24/19 130/72   Pulse Readings from Last 3 Encounters:  01/24/20 82  10/24/19 84  09/24/19 84    Wt  Readings from Last 3 Encounters:  01/24/20 137 lb (62.1 kg)  10/24/19 149 lb 6 oz (67.8 kg)  09/25/19 150 lb 6.4 oz (68.2 kg)     LABS  reviewed today   Lab Results  Component Value Date   WBC 12.8 (H) 08/22/2019   HGB 12.4 08/22/2019   HCT 37.1 08/22/2019   MCV 97.9 08/22/2019   PLT 294 08/22/2019   Lab Results  Component Value Date   CREATININE 0.77 09/24/2019   BUN 8 09/24/2019   NA 140 09/24/2019   K 4.8 09/24/2019   CL 102 09/24/2019   CO2 21 09/24/2019   Lab Results  Component Value Date   ALT 29 10/06/2017   AST 36 10/06/2017   ALKPHOS 75 10/06/2017   BILITOT 0.6 10/06/2017   Lab Results  Component Value Date   CHOL 139 09/24/2019   HDL 61 09/24/2019   LDLCALC 54 09/24/2019   LDLDIRECT 51 09/24/2019   TRIG 138 09/24/2019   CHOLHDL 2.3 09/24/2019    Lab Results  Component Value Date   HGBA1C 6.0 (H) 08/15/2019   Lab Results  Component Value Date   TSH 2.048 08/16/2019     STUDIES/PROCEDURES reviewed today   2D echo 08/15/2019: 1. Left ventricular ejection fraction, by estimation, is 55 to 60%. The  left ventricle has normal function. The left ventricle has no regional  wall motion abnormalities. There is mild left ventricular hypertrophy.  Left ventricular diastolic parameters  were normal.  2. Right ventricular systolic function is normal. The right ventricular  size is normal. There is normal pulmonary artery systolic pressure.  3. The mitral valve is normal in structure. Mild mitral valve  regurgitation. No evidence of mitral stenosis.  4. The aortic valve is abnormal. Aortic valve regurgitation is not  visualized. Mild to moderate aortic valve  sclerosis/calcification is  present, without any evidence of aortic stenosis.  __________   LHC 08/16/2019: A drug-eluting stent was successfully placed using a STENT SYNERGY DES 2.25X20.  A stent was successfully placed. Conclusions: 1. 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 2. Moderately elevated left ventricular filling pressure. 3. Complex PCI to distal RCA, rPDA, and rPLA using Synergy 2.25 x 20 mm drug-eluting stent extending from the distal RCA into the rPDA, as well as angioplasty of the ostial rPL branch. Intervention was complicated by no-reflow with transient hypotension/bradycardia and subsequent development of atrial fibrillation with rapid ventricular response. Final angiogram shows 0% residual stenosis in the distal RCA/rPDA and 10% residual stenosis at ostial rPL with TIMI-2 flow. Recommendations: 1. Transfer to ICU for aggressive medical therapy and post-PCI monitoring. 2. Continue tirofiban infusion for 18 hours. 3. Dual antiplatelet therapy with aspirin and ticagrelor for at least 12 months. 4. Titrate nitroglycerine infusion for relief of chest pain. 5. Continue amiodarone infusion for rate control and hopefully conversion to sinus rhythm. If patient remains in atrial fibrillation tomorrow, IV heparin should be started (will defer heparin at this time given tirofiban infusion). 6. Aggressive secondary prevention. 7. Anticipate relook catheterization on Monday with PCI to the LCx. I favor medical therapy for D2, given relatively small vessel size and need to stent back to the LAD, were intervention undertaken. __________  Limited 2D echo 08/17/2019: 1. Left ventricular ejection fraction, by estimation, is 60 to 65%. The  left ventricle  has normal function. The left  ventricle has no regional  wall motion abnormalities. There is mild concentric left ventricular  hypertrophy. Left ventricular diastolic  parameters are consistent with Grade I diastolic dysfunction (impaired  relaxation). Elevated left ventricular end-diastolic pressure.  2. Right ventricular systolic function is normal. The right ventricular  size is normal.  3. The mitral valve is normal in structure. Trivial mitral valve  regurgitation. No evidence of mitral stenosis.  4. The aortic valve is tricuspid. Aortic valve regurgitation is not  visualized. No aortic stenosis is present. __________  LHC 08/19/2019:  2nd Diag lesion is 80% stenosed.  1st Mrg lesion is 100% stenosed.  Prox Cx to Mid Cx lesion is 60% stenosed.  Dist Cx lesion is 80% stenosed.  Mid RCA lesion is 55% stenosed.  RPAV lesion is 10% stenosed.  Balloon angioplasty was performed.  Previously placed Dist RCA drug-eluting stents are widely patent with 0% stenosed side branch in RPDA.  Post intervention, there is a 0% residual stenosis.  A drug-eluting stent was successfully placed using a STENT RESOLUTE ONYX 2.75X18.  1. Widely patent RCA stent with normal flow distally. Unchanged left coronary artery disease. 2. Mildly elevated left ventricular end-diastolic pressure at 14 mmHg. 3. Successful direct stenting of the distal left circumflex. Recommendations: Continue dual antiplatelet therapy for 1 year. Aggressive treatment of risk factors. If the patient remains stable, she can potentially be discharged home in the afternoon.  Assessment & Plan   CAD s/p complicated PCI History of NSTEMI Atypical chest pain, improving --No current CP. S/p NSTEMI with cath showing multivessel CAD. Culprit felt to be thrombotic distal RCA with PCI to the dRCA, RPDA, & RPL, complicated by no reflow after stent post dilation with resultant hypotension, bradycardia, and ultimate development of atrial fibrillation  with RVR.  Repeat cath with patent RCA stent & PCI of dLCx.  Repeat limited echo showed EF 55 to 60%. Reports resolution of previously reported  dyspnea and fatigue, attributed to completing cardiac rehab. Notes less frequent atypical CP, as previously described. CP is sharp and lasts seconds. It occurs in the AM without any other clear associated factors. CP is different than before her NSTEMI/ PCI.  As noted before, low suspicion CP is 2/2 cardiac insufficiency due to the atypical nature of the CP. She will continue to monitor her sx. She agrees to call the office if worsening or new sx. Prescribed PRN SL nitro today.With ongoing atypical CP, consider Imdur / Ranexa at RTC. Continue omeprazole and no s/sx of GIB reported today. Continue Toprol and DAPT with ASA and ticagrelor for at least 12 months. Continue secondary prevention of risk factors, including statin therapy. Ongoing diet and lifestyle changes recommended.  Atrial fibrillation with RVR, s/p PCI --Denies any racing heart rate or palpitations. A. fib with RVR developed during complicated PCI and thought likely mediated by acute ischemia and administration of atropine and norepinephrine.  She was discharged on oral amiodarone, which has since been discontinued.  She is maintaining NSR by previous EKG and RRR on exam today.  No report of racing heart rate or palpitations.  Continue Toprol for rate control.  No recommendation for anticoagulation as noted in past and given her likely etiology of her atrial fibrillation was 2/2 acute ischemia during catheterization.  Chronic HFpEF --Euvolemic and well compensated on exam.  Reports resolution of previous fatigue and dyspnea with cardiac rehab.  LVEF moderately elevated at the time of catheterization, though noted to be normal on  her previous echo.  She is not on a diuretic with no indication to start a diuretic today.  Estimated dry weight 69-70kg with weight today 62.1kg and down from her previous  clinic weight.  BP well controlled.  She continues on a low-salt diet and total fluid under 2 L as recommended.  Continue current Toprol and losartan. Continue diet and exercise as discussed.  HLD --LDL 54 and at goal of below 70 per most recent labs.  Continue high intensity atorvastatin 80 mg daily for target LDL less than 70.    Hypertension -BP well controlled.  No medication changes.  Continue current Toprol and losartan.  Continue to monitor BP at home with goal BP 130/80 or lower.  Mitral regurgitation --Mild to moderate by initial echo.  Continue to monitor with periodic echo as indicated.  Elevated A1C -Previous 08/2019 A1c elevated at 6.0.  Recommend glycemic control and management per PCP.  Continue losartan given comorbid HTN.    Medication changes: None.  Labs ordered: None. Studies / Imaging ordered: None.  Disposition: RTC 6 months  Arvil Chaco, PA-C 01/24/2020

## 2020-01-24 ENCOUNTER — Other Ambulatory Visit: Payer: Self-pay

## 2020-01-24 ENCOUNTER — Encounter: Payer: Self-pay | Admitting: Physician Assistant

## 2020-01-24 ENCOUNTER — Ambulatory Visit (INDEPENDENT_AMBULATORY_CARE_PROVIDER_SITE_OTHER): Payer: Medicare Other | Admitting: Physician Assistant

## 2020-01-24 VITALS — BP 118/60 | HR 82 | Ht 62.0 in | Wt 137.0 lb

## 2020-01-24 DIAGNOSIS — R7309 Other abnormal glucose: Secondary | ICD-10-CM

## 2020-01-24 DIAGNOSIS — I2510 Atherosclerotic heart disease of native coronary artery without angina pectoris: Secondary | ICD-10-CM

## 2020-01-24 DIAGNOSIS — I5032 Chronic diastolic (congestive) heart failure: Secondary | ICD-10-CM | POA: Diagnosis not present

## 2020-01-24 DIAGNOSIS — E785 Hyperlipidemia, unspecified: Secondary | ICD-10-CM

## 2020-01-24 DIAGNOSIS — I252 Old myocardial infarction: Secondary | ICD-10-CM

## 2020-01-24 DIAGNOSIS — Z8679 Personal history of other diseases of the circulatory system: Secondary | ICD-10-CM

## 2020-01-24 DIAGNOSIS — R0789 Other chest pain: Secondary | ICD-10-CM | POA: Diagnosis not present

## 2020-01-24 DIAGNOSIS — I340 Nonrheumatic mitral (valve) insufficiency: Secondary | ICD-10-CM

## 2020-01-24 DIAGNOSIS — I10 Essential (primary) hypertension: Secondary | ICD-10-CM

## 2020-01-24 MED ORDER — NITROGLYCERIN 0.4 MG SL SUBL
0.4000 mg | SUBLINGUAL_TABLET | SUBLINGUAL | 1 refills | Status: DC | PRN
Start: 2020-01-24 — End: 2021-09-17

## 2020-01-24 NOTE — Patient Instructions (Signed)
Medication Instructions:  Your physician recommends that you continue on your current medications as directed. Please refer to the Current Medication list given to you today.  Nitroglycerin as needed for chest pain - Dissolve 1 tablet (0.4 mg) under your tongue every 5 minutes as needed for chest pain. Do not take more than 3 doses. If chest pain does not resolve, then call 911 or go to the Emergency Room.   *If you need a refill on your cardiac medications before your next appointment, please call your pharmacy*  Follow-Up: At [facility], you and your health needs are our priority.  As part of our continuing mission to provide you with exceptional heart care, we have created designated Provider Care Teams.  These Care Teams include your primary Cardiologist (physician) and Advanced Practice Providers (APPs -  Physician Assistants and Nurse Practitioners) who all work together to provide you with the care you need, when you need it.  We recommend signing up for the patient portal called "MyChart".  Sign up information is provided on this After Visit Summary.  MyChart is used to connect with patients for Virtual Visits (Telemedicine).  Patients are able to view lab/test results, encounter notes, upcoming appointments, etc.  Non-urgent messages can be sent to your provider as well.   To learn more about what you can do with MyChart, go to [email or web address].    Your next appointment:   6 month(s)  The format for your next appointment:   In Person  Provider:   You may see Kathlyn Sacramento, MD or one of the following Advanced Practice Providers on your designated Care Team:    Murray Hodgkins, NP  Christell Faith, PA-C  Marrianne Mood, PA-C  Cadence [place], [place]  Laurann Montana, NP

## 2020-02-25 ENCOUNTER — Telehealth: Payer: Self-pay | Admitting: Cardiovascular Disease

## 2020-02-25 ENCOUNTER — Other Ambulatory Visit: Payer: Self-pay

## 2020-02-25 ENCOUNTER — Inpatient Hospital Stay
Admission: EM | Admit: 2020-02-25 | Discharge: 2020-02-26 | DRG: 247 | Disposition: A | Discharge status: Home / Self Care | Payer: Medicare Other | Attending: Internal Medicine | Admitting: Internal Medicine

## 2020-02-25 ENCOUNTER — Encounter
Admission: EM | Disposition: A | Discharge status: Home / Self Care | Payer: Self-pay | Source: Home / Self Care | Attending: Internal Medicine

## 2020-02-25 ENCOUNTER — Inpatient Hospital Stay (HOSPITAL_COMMUNITY)
Admit: 2020-02-25 | Discharge: 2020-02-25 | Disposition: A | Discharge status: Home / Self Care | Payer: Medicare Other | Attending: Internal Medicine | Admitting: Internal Medicine

## 2020-02-25 ENCOUNTER — Emergency Department: Payer: Medicare Other

## 2020-02-25 DIAGNOSIS — Z7902 Long term (current) use of antithrombotics/antiplatelets: Secondary | ICD-10-CM

## 2020-02-25 DIAGNOSIS — Z79899 Other long term (current) drug therapy: Secondary | ICD-10-CM

## 2020-02-25 DIAGNOSIS — D729 Disorder of white blood cells, unspecified: Secondary | ICD-10-CM

## 2020-02-25 DIAGNOSIS — I2582 Chronic total occlusion of coronary artery: Secondary | ICD-10-CM | POA: Diagnosis present

## 2020-02-25 DIAGNOSIS — Z955 Presence of coronary angioplasty implant and graft: Secondary | ICD-10-CM

## 2020-02-25 DIAGNOSIS — I110 Hypertensive heart disease with heart failure: Secondary | ICD-10-CM | POA: Diagnosis present

## 2020-02-25 DIAGNOSIS — I25119 Atherosclerotic heart disease of native coronary artery with unspecified angina pectoris: Secondary | ICD-10-CM | POA: Diagnosis present

## 2020-02-25 DIAGNOSIS — Z881 Allergy status to other antibiotic agents status: Secondary | ICD-10-CM

## 2020-02-25 DIAGNOSIS — D72828 Other elevated white blood cell count: Secondary | ICD-10-CM | POA: Diagnosis present

## 2020-02-25 DIAGNOSIS — I10 Essential (primary) hypertension: Secondary | ICD-10-CM | POA: Diagnosis present

## 2020-02-25 DIAGNOSIS — K219 Gastro-esophageal reflux disease without esophagitis: Secondary | ICD-10-CM | POA: Diagnosis present

## 2020-02-25 DIAGNOSIS — Z20822 Contact with and (suspected) exposure to covid-19: Secondary | ICD-10-CM | POA: Diagnosis present

## 2020-02-25 DIAGNOSIS — R079 Chest pain, unspecified: Secondary | ICD-10-CM | POA: Diagnosis present

## 2020-02-25 DIAGNOSIS — Z8249 Family history of ischemic heart disease and other diseases of the circulatory system: Secondary | ICD-10-CM | POA: Diagnosis not present

## 2020-02-25 DIAGNOSIS — I252 Old myocardial infarction: Secondary | ICD-10-CM | POA: Diagnosis not present

## 2020-02-25 DIAGNOSIS — I2510 Atherosclerotic heart disease of native coronary artery without angina pectoris: Secondary | ICD-10-CM

## 2020-02-25 DIAGNOSIS — Z856 Personal history of leukemia: Secondary | ICD-10-CM

## 2020-02-25 DIAGNOSIS — R071 Chest pain on breathing: Secondary | ICD-10-CM

## 2020-02-25 DIAGNOSIS — C9110 Chronic lymphocytic leukemia of B-cell type not having achieved remission: Secondary | ICD-10-CM | POA: Diagnosis not present

## 2020-02-25 DIAGNOSIS — I214 Non-ST elevation (NSTEMI) myocardial infarction: Secondary | ICD-10-CM | POA: Diagnosis present

## 2020-02-25 DIAGNOSIS — Z7982 Long term (current) use of aspirin: Secondary | ICD-10-CM | POA: Diagnosis not present

## 2020-02-25 DIAGNOSIS — I5032 Chronic diastolic (congestive) heart failure: Secondary | ICD-10-CM | POA: Diagnosis present

## 2020-02-25 DIAGNOSIS — Z882 Allergy status to sulfonamides status: Secondary | ICD-10-CM

## 2020-02-25 DIAGNOSIS — E785 Hyperlipidemia, unspecified: Secondary | ICD-10-CM

## 2020-02-25 DIAGNOSIS — R778 Other specified abnormalities of plasma proteins: Secondary | ICD-10-CM | POA: Diagnosis present

## 2020-02-25 HISTORY — PX: LEFT HEART CATH AND CORONARY ANGIOGRAPHY: CATH118249

## 2020-02-25 HISTORY — PX: CORONARY STENT INTERVENTION: CATH118234

## 2020-02-25 LAB — POCT ACTIVATED CLOTTING TIME
Activated Clotting Time: 339 seconds
Activated Clotting Time: 350 seconds

## 2020-02-25 LAB — TSH: TSH: 1.114 u[IU]/mL (ref 0.350–4.500)

## 2020-02-25 LAB — CBC
HCT: 44.3 % (ref 36.0–46.0)
Hemoglobin: 15 g/dL (ref 12.0–15.0)
MCH: 32.9 pg (ref 26.0–34.0)
MCHC: 33.9 g/dL (ref 30.0–36.0)
MCV: 97.1 fL (ref 80.0–100.0)
Platelets: 264 10*3/uL (ref 150–400)
RBC: 4.56 MIL/uL (ref 3.87–5.11)
RDW: 13.9 % (ref 11.5–15.5)
WBC: 12.6 10*3/uL — ABNORMAL HIGH (ref 4.0–10.5)
nRBC: 0 % (ref 0.0–0.2)

## 2020-02-25 LAB — APTT: aPTT: 32 seconds (ref 24–36)

## 2020-02-25 LAB — PROTIME-INR
INR: 1.1 (ref 0.8–1.2)
Prothrombin Time: 13.5 seconds (ref 11.4–15.2)

## 2020-02-25 LAB — RESP PANEL BY RT-PCR (FLU A&B, COVID) ARPGX2
Influenza A by PCR: NEGATIVE
Influenza B by PCR: NEGATIVE
SARS Coronavirus 2 by RT PCR: NEGATIVE

## 2020-02-25 LAB — BASIC METABOLIC PANEL
Anion gap: 11 (ref 5–15)
BUN: 8 mg/dL (ref 8–23)
CO2: 23 mmol/L (ref 22–32)
Calcium: 9.5 mg/dL (ref 8.9–10.3)
Chloride: 103 mmol/L (ref 98–111)
Creatinine, Ser: 0.77 mg/dL (ref 0.44–1.00)
GFR, Estimated: >60 mL/min (ref >60)
Glucose, Bld: 126 mg/dL — ABNORMAL HIGH (ref 70–99)
Potassium: 3.8 mmol/L (ref 3.5–5.1)
Sodium: 137 mmol/L (ref 135–145)

## 2020-02-25 LAB — TROPONIN I (HIGH SENSITIVITY)
Troponin I (High Sensitivity): 177 ng/L (ref <18)
Troponin I (High Sensitivity): 257 ng/L (ref <18)

## 2020-02-25 SURGERY — CORONARY STENT INTERVENTION
Anesthesia: Moderate Sedation

## 2020-02-25 MED ORDER — ACETAMINOPHEN 325 MG PO TABS: 325.0000 mg | ORAL_TABLET | Freq: Four times a day (QID) | ORAL | Status: DC | PRN

## 2020-02-25 MED ORDER — VERAPAMIL HCL 2.5 MG/ML IV SOLN
INTRAVENOUS | Status: DC | PRN
  Administered 2020-02-25 (×2): 2.5 mg via INTRA_ARTERIAL

## 2020-02-25 MED ORDER — VERAPAMIL HCL 2.5 MG/ML IV SOLN
INTRAVENOUS | Status: AC
  Filled 2020-02-25: qty 2

## 2020-02-25 MED ORDER — MIDAZOLAM HCL 2 MG/2ML IJ SOLN
INTRAMUSCULAR | Status: DC | PRN
  Administered 2020-02-25 (×2): 1 mg via INTRAVENOUS

## 2020-02-25 MED ORDER — HYDRALAZINE HCL 20 MG/ML IJ SOLN: 10.0000 mg | INTRAMUSCULAR | Status: AC | PRN

## 2020-02-25 MED ORDER — SODIUM CHLORIDE 0.9% FLUSH
3.0000 mL | Freq: Two times a day (BID) | INTRAVENOUS | Status: DC
  Administered 2020-02-25 – 2020-02-26 (×2): 3 mL via INTRAVENOUS

## 2020-02-25 MED ORDER — VITAMIN D3 25 MCG (1000 UNIT) PO TABS
3000.0000 [IU] | ORAL_TABLET | Freq: Every day | ORAL | Status: DC
  Administered 2020-02-26: 3000 [IU] via ORAL
  Filled 2020-02-25 (×2): qty 3

## 2020-02-25 MED ORDER — PANTOPRAZOLE SODIUM 40 MG PO TBEC
40.0000 mg | DELAYED_RELEASE_TABLET | Freq: Every day | ORAL | Status: DC
  Administered 2020-02-26: 40 mg via ORAL
  Filled 2020-02-25: qty 1

## 2020-02-25 MED ORDER — SODIUM CHLORIDE 0.9 % WEIGHT BASED INFUSION: 3.0000 mL/kg/h | INTRAVENOUS | Status: DC

## 2020-02-25 MED ORDER — ASPIRIN 81 MG PO CHEW: 81.0000 mg | CHEWABLE_TABLET | ORAL | Status: DC

## 2020-02-25 MED ORDER — TICAGRELOR 90 MG PO TABS
90.0000 mg | ORAL_TABLET | Freq: Two times a day (BID) | ORAL | Status: DC
  Administered 2020-02-26: 90 mg via ORAL
  Filled 2020-02-25: qty 1

## 2020-02-25 MED ORDER — ATORVASTATIN CALCIUM 80 MG PO TABS
80.0000 mg | ORAL_TABLET | Freq: Every day | ORAL | Status: DC
  Administered 2020-02-26: 80 mg via ORAL
  Filled 2020-02-25: qty 1

## 2020-02-25 MED ORDER — ENOXAPARIN SODIUM 40 MG/0.4ML ~~LOC~~ SOLN
40.0000 mg | SUBCUTANEOUS | Status: DC
  Filled 2020-02-25 (×2): qty 0.4

## 2020-02-25 MED ORDER — NITROGLYCERIN 1 MG/10 ML FOR IR/CATH LAB
INTRA_ARTERIAL | Status: DC | PRN
  Administered 2020-02-25: 100 ug via INTRACORONARY
  Administered 2020-02-25: 200 ug via INTRACORONARY

## 2020-02-25 MED ORDER — TICAGRELOR 90 MG PO TABS
ORAL_TABLET | ORAL | Status: AC
  Filled 2020-02-25: qty 1

## 2020-02-25 MED ORDER — ASPIRIN 81 MG PO CHEW
81.0000 mg | CHEWABLE_TABLET | Freq: Every day | ORAL | Status: DC
  Administered 2020-02-26: 81 mg via ORAL
  Filled 2020-02-25: qty 1

## 2020-02-25 MED ORDER — LIDOCAINE HCL (PF) 1 % IJ SOLN
INTRAMUSCULAR | Status: DC | PRN
  Administered 2020-02-25: 2 mL

## 2020-02-25 MED ORDER — MIDAZOLAM HCL 2 MG/2ML IJ SOLN
INTRAMUSCULAR | Status: AC
  Filled 2020-02-25: qty 2

## 2020-02-25 MED ORDER — IOHEXOL 300 MG/ML  SOLN
INTRAMUSCULAR | Status: DC | PRN
  Administered 2020-02-25: 55 mL

## 2020-02-25 MED ORDER — LABETALOL HCL 5 MG/ML IV SOLN: 10.0000 mg | INTRAVENOUS | Status: AC | PRN

## 2020-02-25 MED ORDER — FENTANYL CITRATE (PF) 100 MCG/2ML IJ SOLN
INTRAMUSCULAR | Status: AC
  Filled 2020-02-25: qty 2

## 2020-02-25 MED ORDER — METOPROLOL TARTRATE 5 MG/5ML IV SOLN
5.0000 mg | INTRAVENOUS | Status: DC | PRN
Start: 2020-02-25 — End: 2020-02-25

## 2020-02-25 MED ORDER — NITROGLYCERIN 0.4 MG SL SUBL: 0.4000 mg | SUBLINGUAL_TABLET | SUBLINGUAL | Status: DC | PRN

## 2020-02-25 MED ORDER — HEPARIN (PORCINE) IN NACL 1000-0.9 UT/500ML-% IV SOLN
INTRAVENOUS | Status: DC | PRN
  Administered 2020-02-25: 500 mL

## 2020-02-25 MED ORDER — SODIUM CHLORIDE 0.9 % IV SOLN: 250.0000 mL | INTRAVENOUS | Status: DC | PRN

## 2020-02-25 MED ORDER — LOSARTAN POTASSIUM 25 MG PO TABS
12.5000 mg | ORAL_TABLET | Freq: Every day | ORAL | Status: DC
  Administered 2020-02-26: 12.5 mg via ORAL
  Filled 2020-02-25: qty 0.5
  Filled 2020-02-25: qty 1

## 2020-02-25 MED ORDER — ONDANSETRON HCL 4 MG PO TABS: 4.0000 mg | ORAL_TABLET | Freq: Four times a day (QID) | ORAL | Status: DC | PRN

## 2020-02-25 MED ORDER — SODIUM CHLORIDE 0.9% FLUSH: 3.0000 mL | INTRAVENOUS | Status: DC | PRN

## 2020-02-25 MED ORDER — HEPARIN BOLUS VIA INFUSION
3700.0000 [IU] | Freq: Once | INTRAVENOUS | Status: AC
  Administered 2020-02-25: 3700 [IU] via INTRAVENOUS
  Filled 2020-02-25: qty 3700

## 2020-02-25 MED ORDER — HEPARIN SODIUM (PORCINE) 1000 UNIT/ML IJ SOLN
INTRAMUSCULAR | Status: AC
  Filled 2020-02-25: qty 1

## 2020-02-25 MED ORDER — ASPIRIN 81 MG PO CHEW
243.0000 mg | CHEWABLE_TABLET | Freq: Once | ORAL | Status: AC
  Administered 2020-02-25: 243 mg via ORAL
  Filled 2020-02-25: qty 3

## 2020-02-25 MED ORDER — HEPARIN SODIUM (PORCINE) 1000 UNIT/ML IJ SOLN
INTRAMUSCULAR | Status: DC | PRN
  Administered 2020-02-25 (×2): 3000 [IU] via INTRAVENOUS

## 2020-02-25 MED ORDER — METOPROLOL SUCCINATE ER 50 MG PO TB24
75.0000 mg | ORAL_TABLET | Freq: Every day | ORAL | Status: DC
  Administered 2020-02-26: 75 mg via ORAL
  Filled 2020-02-25: qty 1

## 2020-02-25 MED ORDER — FENTANYL CITRATE (PF) 100 MCG/2ML IJ SOLN
INTRAMUSCULAR | Status: DC | PRN
  Administered 2020-02-25 (×2): 25 ug via INTRAVENOUS

## 2020-02-25 MED ORDER — ONDANSETRON HCL 4 MG/2ML IJ SOLN: 4.0000 mg | Freq: Four times a day (QID) | INTRAMUSCULAR | Status: DC | PRN

## 2020-02-25 MED ORDER — HEPARIN (PORCINE) IN NACL 1000-0.9 UT/500ML-% IV SOLN
INTRAVENOUS | Status: AC
  Filled 2020-02-25: qty 500

## 2020-02-25 MED ORDER — TICAGRELOR 90 MG PO TABS
ORAL_TABLET | ORAL | Status: DC | PRN
  Administered 2020-02-25: 90 mg via ORAL

## 2020-02-25 MED ORDER — SODIUM CHLORIDE 0.9 % IV SOLN: INTRAVENOUS | Status: AC

## 2020-02-25 MED ORDER — SODIUM CHLORIDE 0.9 % WEIGHT BASED INFUSION: 1.0000 mL/kg/h | INTRAVENOUS | Status: DC

## 2020-02-25 MED ORDER — SODIUM CHLORIDE 0.9% FLUSH
3.0000 mL | Freq: Two times a day (BID) | INTRAVENOUS | Status: DC
  Administered 2020-02-26: 3 mL via INTRAVENOUS

## 2020-02-25 MED ORDER — ACETAMINOPHEN 650 MG RE SUPP: 325.0000 mg | Freq: Four times a day (QID) | RECTAL | Status: DC | PRN

## 2020-02-25 MED ORDER — HEPARIN (PORCINE) 25000 UT/250ML-% IV SOLN
750.0000 [IU]/h | INTRAVENOUS | Status: DC
  Administered 2020-02-25: 750 [IU]/h via INTRAVENOUS
  Filled 2020-02-25: qty 250

## 2020-02-25 MED ORDER — LIDOCAINE HCL (PF) 1 % IJ SOLN
INTRAMUSCULAR | Status: AC
  Filled 2020-02-25: qty 30

## 2020-02-25 MED ORDER — TICAGRELOR 90 MG PO TABS: 90.0000 mg | ORAL_TABLET | Freq: Two times a day (BID) | ORAL | Status: DC

## 2020-02-25 MED ORDER — NITROGLYCERIN 1 MG/10 ML FOR IR/CATH LAB
INTRA_ARTERIAL | Status: AC
  Filled 2020-02-25: qty 10

## 2020-02-25 SURGICAL SUPPLY — 17 items
BALLN TREK RX 2.25X12 (BALLOONS) ×2
BALLN ~~LOC~~ TREK RX 3.0X12 (BALLOONS) ×2
BALLOON TREK RX 2.25X12 (BALLOONS) ×1 IMPLANT
BALLOON ~~LOC~~ TREK RX 3.0X12 (BALLOONS) ×1 IMPLANT
CATH INFINITI 5 FR JL3.5 (CATHETERS) ×2 IMPLANT
CATH INFINITI JR4 5F (CATHETERS) ×2 IMPLANT
CATH LAUNCHER 5F JR4 (CATHETERS) ×2 IMPLANT
DEVICE RAD TR BAND REGULAR (VASCULAR PRODUCTS) ×2 IMPLANT
GLIDESHEATH SLEND SS 6F .021 (SHEATH) ×2 IMPLANT
GUIDEWIRE INQWIRE 1.5J.035X260 (WIRE) ×1 IMPLANT
INQWIRE 1.5J .035X260CM (WIRE) ×2
KIT ENCORE 26 ADVANTAGE (KITS) ×2 IMPLANT
KIT MANI 3VAL PERCEP (MISCELLANEOUS) ×2 IMPLANT
PACK CARDIAC CATH (CUSTOM PROCEDURE TRAY) ×2 IMPLANT
STENT RESOLUTE ONYX 2.75X15 (Permanent Stent) ×2 IMPLANT
WIRE HITORQ VERSACORE ST 145CM (WIRE) ×2 IMPLANT
WIRE RUNTHROUGH .014X180CM (WIRE) ×2 IMPLANT

## 2020-02-25 NOTE — Telephone Encounter (Signed)
Pt c/o of Chest Pain: STAT if CP now or developed within 24 hours  1. Are you having CP right now? yes  2. Are you experiencing any other symptoms (ex. SOB, nausea, vomiting, sweating)? Pressure, discomfort and sob  3. How long have you been experiencing CP? 2 weeks, started when exertion on treadmill now pressure when standing at the sink  4. Is your CP continuous or coming and going? continuous in morning, comes and goes in pm  5. Have you taken Nitroglycerin? Have not taken any ?

## 2020-02-25 NOTE — Consult Note (Signed)
ANTICOAGULATION CONSULT NOTE - Initial Consult  Pharmacy Consult for heparin drip Indication: chest pain/ACS  Allergies  Allergen Reactions  . Corticosteroids     Other reaction(s): Headache  . Other Other (See Comments)  . Sulfa Antibiotics Anaphylaxis  . Clarithromycin     Other reaction(s): Other (See Comments) Rectal bleeding   . Cymbalta [Duloxetine Hcl] Diarrhea  . Prednisone Other (See Comments)    Color changes in face, headache and difficulty walking    Patient Measurements: Height: 5\' 2"  (157.5 cm) Weight: 62 kg (136 lb 11 oz) IBW/kg (Calculated) : 50.1 Heparin Dosing Weight: 62kg  Vital Signs: Temp: 97.8 F (36.6 C) (02/15 1004) Temp Source: Oral (02/15 1004) BP: 131/72 (02/15 1210) Pulse Rate: 69 (02/15 1210)  Labs: Recent Labs    02/25/20 1004  HGB 15.0  HCT 44.3  PLT 264  CREATININE 0.77  TROPONINIHS 177*    Estimated Creatinine Clearance: 52.7 mL/min (by C-G formula based on SCr of 0.77 mg/dL).   Medical History: Past Medical History:  Diagnosis Date  . Allergy   . Arthritis   . Cancer ([place])    CLL    Medications:  No PTA anticoagulant of record  Assessment: 75 yo female with PMH of CLL, a fib, HTN, and with recent NSTEMI in august with two stents placed - on Brilinta and asa 81.   Goal of Therapy:  Heparin level 0.3-0.7 units/ml Monitor platelets by anticoagulation protocol: Yes   Plan:  Baseline CBC obtained, will obtain baseline INR and aPTT per protocol.  Per Heparin,  Give 3700 units bolus x 1, followed by 750 units/hr  Will recheck heparin level in 8 hours per protocol   Lu Duffel, PharmD, BCPS Clinical Pharmacist 02/25/2020 12:30 PM

## 2020-02-25 NOTE — ED Notes (Signed)
Pt states she took 1 81mg  aspirin this morning.

## 2020-02-25 NOTE — ED Triage Notes (Signed)
Pt comes with chest pain for about 2 weeks but started out as exercised induced and over the past two days has increased to during rest. Pt had NSTEMI in august with two stents placed.

## 2020-02-25 NOTE — Telephone Encounter (Signed)
Agree with recommendation for ED evaluation. Thank you!  Loel Dubonnet, NP

## 2020-02-25 NOTE — Interval H&P Note (Signed)
History and Physical Interval Note:  02/25/2020 3:13 PM  Cheryl Briggs  has presented today for surgery, with the diagnosis of NSTEMI.  The various methods of treatment have been discussed with the patient and family. After consideration of risks, benefits and other options for treatment, the patient has consented to  Procedure(s): CORONARY ANGIOGRAPHY (N/A) as a surgical intervention.  The patient's history has been reviewed, patient examined, no change in status, stable for surgery.  I have reviewed the patient's chart and labs.  Questions were answered to the patient's satisfaction.    Cath Lab Visit (complete for each Cath Lab visit)  Clinical Evaluation Leading to the Procedure:   ACS: Yes.    Non-ACS:    Harrell Gave Joesph Marcy

## 2020-02-25 NOTE — H&P (Addendum)
History and Physical   Cheryl Briggs YTK:[id number] DOB: Mar 15, 1945 DOA: 02/25/2020  PCP: Joyice Faster, FNP  Outpatient Specialists: [facility] Cardiology, Dr. Saunders Revel and Dr. Fletcher Anon Patient coming from: home via private vehicle  I have personally briefly reviewed patient's old medical records in [place].  Chief Concern: chest pressure   HPI: Cheryl Briggs is a 75 y.o. female with medical history significant for hypertension, multivessel coronary artery disease, hyperlipidemia, depression, CLL, GERD, presents to the emergency department for chief concerns of chest pressure.  She reports the chest pressure started about two weeks ago. Initially, it was with exertion and now it is at rest. She believes it woke her up. She denies new pain in arms, including weakness and numbness. She denies jaw, neck discomfort, or abnormal taste in her mouth. She endorses associated shortness of breath with bending forward and today, this am when walking to the ED, she developed shortness of breath. This is new for her. She denies nausea, vomiting, abdominal pain.   Social history: she is retired and formerly worked as an Therapist, sports in [employer], structural. She denies history of tobacco use, etoh use, recreational drug use.   Vaccination history: patient is fully vaccinated for covid 19 including booster.  ROS: Constitutional: no weight change, no fever ENT/Mouth: no sore throat, no rhinorrhea Eyes: no eye pain, no vision changes Cardiovascular: + chest pain, + dyspnea,  no edema, no palpitations Respiratory: no cough, no sputum, no wheezing Gastrointestinal: no nausea, no vomiting, no diarrhea, no constipation Genitourinary: no urinary incontinence, no dysuria, no hematuria Musculoskeletal: no arthralgias, no myalgias Skin: no skin lesions, no pruritus, Neuro: + weakness, no loss of consciousness, no syncope Psych: no anxiety, no depression, + decrease appetite Heme/Lymph: no bruising, no bleeding  ED Course:  Discussed with ED provider, patient requiring hospitalization due to chest pressure with elevated troponins concerning for NSTEMI.  Vitals in the emergency department was afebrile with temperature of 97.8, respiration rate of 19, heart rate is 74, blood pressure 131/72, SPO2 of 98% on room air.  Labs were relatively unremarkable with leukocytosis of 12.6, hemoglobin of 15, hematocrit 44.3, platelets 264.  EDP ordered aspirin to 243 mg once, heparin GTT, nitroglycerin sublingual once.  Assessment/Plan  Active Problems:   CLL (chronic lymphocytic leukemia) (HCC)   NSTEMI (non-ST elevated myocardial infarction) [facility])   Essential hypertension   Chest pain   Neutrophilic leukocytosis   Chest pressure concerning for NSTEMI -Cardiology, Dr. Harrell Gave End has been consulted via secure chat and [facility] MG team will see the patient -Continue aspirin and Brilinta -Continue heparin drip -N.p.o. pending cardiology evaluation -Per cardiology echo does not need to be ordered at this time  Hypertension-resumed home losartan 12.5 mg daily, metoprolol succinate 75 mg daily, metoprolol tartrate 5 mg IVP every 4 hours as needed for elevated SBP  Hyperlipidemia-atorvastatin 80 mg daily  GERD-pantoprazole 40 mg daily  CLL-outpatient follow-up  Chart reviewed.   DVT prophylaxis: Heparin GTT Code Status: Limited, she would like resuscitation without intubation  Diet: N.p.o., pending cardiology evaluation Family Communication: Updated daughter at bedside Disposition Plan: Pending clinical course Consults called: Cardiology Admission status: Aggressive cardiac unit with observation  Past Medical History:  Diagnosis Date  . Allergy   . Arthritis   . Cancer ([place])    CLL   Past Surgical History:  Procedure Laterality Date  . BACK SURGERY    . BLADDER REPAIR    . carpel tunnell Bilateral   . CHOLECYSTECTOMY    . COLONOSCOPY  2015  . CORONARY STENT INTERVENTION N/A 08/16/2019   Procedure: CORONARY  STENT INTERVENTION;  Surgeon: Nelva Bush, MD;  Location: [facility] [place] [facility] LAB;  Service: Cardiovascular;  Laterality: N/A;  . CORONARY STENT INTERVENTION Left 08/19/2019   Procedure: CORONARY STENT INTERVENTION;  Surgeon: Wellington Hampshire, MD;  Location: [place] CV LAB;  Service: Cardiovascular;  Laterality: Left;  . LEFT HEART CATH AND CORONARY ANGIOGRAPHY N/A 08/16/2019   Procedure: LEFT HEART CATH AND CORONARY ANGIOGRAPHY;  Surgeon: Nelva Bush, MD;  Location: [place] CV LAB;  Service: Cardiovascular;  Laterality: N/A;  . SHOULDER SURGERY Bilateral   . UPPER EXTREMITY ANGIOGRAPHY Right 08/21/2019   Procedure: UPPER EXTREMITY ANGIOGRAPH;  Surgeon: Algernon Huxley, MD;  Location: [place] CV LAB;  Service: Cardiovascular;  Laterality: Right;   Social History:  reports that she has never smoked. She has never used smokeless tobacco. She reports that she does not drink alcohol and does not use drugs.  Allergies  Allergen Reactions  . Corticosteroids     Other reaction(s): Headache  . Other Other (See Comments)    Seafood  . Sulfa Antibiotics Anaphylaxis  . Clarithromycin     Other reaction(s): Other (See Comments) Rectal bleeding   . Cymbalta [Duloxetine Hcl] Diarrhea  . Prednisone Other (See Comments)    Color changes in face, headache and difficulty walking   Family History  Problem Relation Age of Onset  . Heart disease Mother   . Heart attack Father   . Breast cancer Neg Hx    Family history: Family history reviewed and not pertinent  Prior to Admission medications   Medication Sig Start Date End Date Taking? Authorizing Provider  aspirin EC 81 MG EC tablet Take 1 tablet (81 mg total) by mouth daily. Swallow whole. 08/19/19   Jennye Boroughs, MD  atorvastatin (LIPITOR) 80 MG tablet Take 1 tablet (80 mg total) by mouth daily. 12/17/19   Wellington Hampshire, MD  Bioflavonoid Products (ESTER C PO) Take 1,000 mg by mouth daily.    [provider]  calcium  citrate-vitamin D (CITRACAL+D) 315-200 MG-UNIT tablet Take 1 tablet by mouth 2 (two) times daily.    [provider]  Chlorpheniramine Maleate (CHLOR-TABLETS PO) Take 1 tablet by mouth as needed.     [provider]  Cholecalciferol 25 MCG (1000 UT) tablet Take 3,000 Units by mouth daily.    [provider]  cyclobenzaprine (FLEXERIL) 10 MG tablet 1 tablet as needed    [provider]  desonide (DESOWEN) 0.05 % ointment desonide 0.05 % topical ointment 02/21/17   [provider]  gabapentin (NEURONTIN) 300 MG capsule Take 300 mg by mouth 3 (three) times daily. 08/26/19   [provider]  losartan (COZAAR) 25 MG tablet Take 0.5 tablets (12.5 mg total) by mouth daily. 12/25/19   Wellington Hampshire, MD  metoprolol succinate (TOPROL-XL) 50 MG 24 hr tablet Take 1.5 tablets (75 mg total) by mouth daily. 11/05/19   Marrianne Mood D, PA-C  Multiple Vitamins-Minerals (MULTIVITAMIN WITH MINERALS) tablet Take 1 tablet by mouth daily.    [provider]  nitroGLYCERIN (NITROSTAT) 0.4 MG SL tablet Place 1 tablet (0.4 mg total) under the tongue every 5 (five) minutes as needed for chest pain. Maximum of 3 doses. 01/24/20   Marrianne Mood D, PA-C  Omeprazole (PRILOSEC PO) Take 1 tablet by mouth daily.     [provider]  ticagrelor (BRILINTA) 90 MG TABS tablet Take 1 tablet (90 mg  total) by mouth 2 (two) times daily. 12/17/19 12/16/20  Wellington Hampshire, MD  traMADol-acetaminophen (ULTRACET) 37.5-325 MG tablet Take 1 tablet by mouth 2 (two) times daily. 09/23/19   [provider]   Physical Exam: Vitals:   02/25/20 1245 02/25/20 1300 02/25/20 1315 02/25/20 1330  BP:  (!) 124/57  122/86  Pulse: 78 70 82 74  Resp: 19 16 17  (!) 21  Temp:      TempSrc:      SpO2: 98% 97% 97% 96%  Weight:      Height:       Constitutional: appears age-appropriate, NAD, calm, comfortable Eyes: PERRL, lids and conjunctivae normal ENMT: Mucous  membranes are moist. Posterior pharynx clear of any exudate or lesions. Age-appropriate dentition. Hearing appropriate Neck: normal, supple, no masses, no thyromegaly Respiratory: clear to auscultation bilaterally, no wheezing, no crackles. Normal respiratory effort. No accessory muscle use.  Cardiovascular: Regular rate and rhythm, no murmurs / rubs / gallops. No extremity edema. 2+ pedal pulses. No carotid bruits.  Abdomen: no tenderness, no masses palpated, no hepatosplenomegaly. Bowel sounds positive.  Musculoskeletal: no clubbing / cyanosis. No joint deformity upper and lower extremities. Good ROM, no contractures, no atrophy. Normal muscle tone.  Skin: no rashes, lesions, ulcers. No induration Neurologic: Sensation intact. Strength 5/5 in all 4.  Psychiatric: Normal judgment and insight. Alert and oriented x 3. Normal mood.   EKG: independently reviewed, showing normal sinus rhythm, rate of 74, QTc 441  Chest x-ray on Admission: I personally reviewed and I agree with radiologist reading as below.  DG Chest 2 View  Result Date: 02/25/2020 CLINICAL DATA:  Chest pain EXAM: CHEST - 2 VIEW COMPARISON:  08/15/2019 FINDINGS: Stable scarring left base. Right lung clear. Interstitial markings are diffusely coarsened with chronic features. The cardiopericardial silhouette is within normal limits for size. Bones are diffusely demineralized. Thoracolumbar scoliosis evident. Nodular density/densities projecting over the lungs are compatible with pads for telemetry leads. IMPRESSION: Stable.  No active cardiopulmonary disease. Electronically Signed   By: Misty Stanley M.D.   On: 02/25/2020 10:30   Labs on Admission: I have personally reviewed following labs  CBC: Recent Labs  Lab 02/25/20 1004  WBC 12.6*  HGB 15.0  HCT 44.3  MCV 97.1  PLT 829   Basic Metabolic Panel: Recent Labs  Lab 02/25/20 1004  NA 137  K 3.8  CL 103  CO2 23  GLUCOSE 126*  BUN 8  CREATININE 0.77  CALCIUM 9.5    GFR: Estimated Creatinine Clearance: 52.7 mL/min (by C-G formula based on SCr of 0.77 mg/dL).  Ahmar Pickrell N Indiana Gamero D.O. [facility]  If 7PM-7AM, please contact overnight-coverage provider If 7AM-7PM, please contact day coverage provider [email or web address]  02/25/2020, 2:20 PM

## 2020-02-25 NOTE — Progress Notes (Signed)
Dr. Saunders Revel in at bedside to speak with pt. And her daughter re: post PCI results. Both verbalize understanding of conversation.

## 2020-02-25 NOTE — H&P (View-Only) (Signed)
Cardiology Consultation:   Patient ID: Cheryl Briggs MRN: [id number]; DOB: [birth year]  Admit date: 02/25/2020 Date of Consult: 02/25/2020  PCP:  Joyice Faster, [place]  Cardiologist:  Kathlyn Sacramento, MD  Advanced Practice Provider:  No care team member to display Electrophysiologist:  None    :[id number]}   Patient Profile:   Cheryl Briggs is a 75 y.o. female with a hx of CLL, arthritis, secondhand smoke exposure, CAD with significant three-vessel disease with complicated/stage PCI complicated by bradycardia/hypotension/A. fib RVR in 08/2019, postprocedure right forearm hematoma, HFpEF, hypertension, mitral regurgitation, hyperlipidemia who is being seen today for the evaluation of chest pain at the request of Dr. Tobie Poet.  History of Present Illness:   [name] is followed by Dr. Fletcher Anon for the above cardiac issues.  The patient has a history of CAD.  She presented to the ER in August 2021.  Echo showed EF 55 to 60%, no wall motion abnormalities, LVH, mild MR.  She underwent left heart cath 08/16/2019 that showed severe multivessel CAD including 80% D2 lesion, potential 60% proximal and 80% distal left circumflex stenosis, chronic total occlusion of OM1, 50 to 60% M RCA stenosis, thrombotic 99% distal RCA lesion extending into the RPDA.  Cath showed moderately elevated LV filling pressures.  Complex PCI was performed to the the RCA, RPDA, and RPL using DES extending from in the RCA into the arch PDA as well as angioplasty performed to the ostial RPL branch.  Distal RCA PCI was complicated by no reflow in the distal branches.  She had transient bradycardia, hypotension and A. fib RVR.  Final angiogram showed 0% stenosis in the distal RCA/RPDA and 10% residual stenosis of the ostial RPL.  It was thought the culprit of the non-STEMI elevation MI was the thrombotic lesion of the distal RCA.  She was set up for a relook cath to consider PCI to the left  circumflex.  Patient required transient atropine and nor epi infusion.  She was continued on IV amiodarone and metoprolol.  She was also treated with IV Lasix.  Repeat echo 08/17/2019 showed EF 60 to 65%.  Went repeat intervention 08/19/2019 with successful DES to the distal left circumflex.  LVEDP was mildly elevated at 14 mmHg.  Previous placed stents were patent.  Procedure she experienced right forearm hematoma with recommendation for conservative management.  She has since been seen multiple times in the cardiac office for follow-up.  She was most recently seen 01/24/2020 and was feeling better after completion of cardiac rehab. Was reporting some atypical chest pain. No changes were made.   The patient presented to the ED 02/25/2020 for chest pressure. Chest pressure started 2 weeks ago.It was worse when walking on the treadmill. It would resolve if she stopped walking. It was left sided and non-radiating. She did not take Nitro for the pain. No associated symptoms. She denies missing doses of DAPT. Denies recent fever, chills, illness, orthopnea, lower leg edema. She feels pain is similar prior to heart attack.   The ED blood pressure 152/83, pulse 81, respiratory rate 18, afebrile, 100% O2. HS troponin 177, WBC 12.6, Hgb 15.0, plt 264.  Chest x-ray unremarkable.  EKG shows sinus rhythm, 74 bpm, nonspecific ST changes was started on IV heparin and admitted.   Past Medical History:  Diagnosis Date  . Allergy   . Arthritis   . Cancer ([place])    CLL    Past Surgical History:  Procedure Laterality Date  .  BACK SURGERY    . BLADDER REPAIR    . carpel tunnell Bilateral   . CHOLECYSTECTOMY    . COLONOSCOPY  2015  . CORONARY STENT INTERVENTION N/A 08/16/2019   Procedure: CORONARY STENT INTERVENTION;  Surgeon: Nelva Bush, MD;  Location: [facility];  Service: Cardiovascular;  Laterality: N/A;  . CORONARY STENT INTERVENTION Left 08/19/2019   Procedure: CORONARY STENT INTERVENTION;   Surgeon: Wellington Hampshire, MD;  Location: [facility];  Service: Cardiovascular;  Laterality: Left;  . LEFT HEART CATH AND CORONARY ANGIOGRAPHY N/A 08/16/2019   Procedure: LEFT HEART CATH AND CORONARY ANGIOGRAPHY;  Surgeon: Nelva Bush, MD;  Location: [facility];  Service: Cardiovascular;  Laterality: N/A;  . SHOULDER SURGERY Bilateral   . UPPER EXTREMITY ANGIOGRAPHY Right 08/21/2019   Procedure: UPPER EXTREMITY ANGIOGRAPH;  Surgeon: Algernon Huxley, MD;  Location: [facility];  Service: Cardiovascular;  Laterality: Right;     Home Medications:  Prior to Admission medications   Medication Sig Start Date End Date Taking? Authorizing Provider  aspirin EC 81 MG EC tablet Take 1 tablet (81 mg total) by mouth daily. Swallow whole. 08/19/19  Yes Jennye Boroughs, MD  atorvastatin (LIPITOR) 80 MG tablet Take 1 tablet (80 mg total) by mouth daily. 12/17/19  Yes Wellington Hampshire, MD  Bioflavonoid Products (ESTER C PO) Take 1,000 mg by mouth daily.   Yes [provider]  Cholecalciferol 25 MCG (1000 UT) tablet Take 3,000 Units by mouth daily.   Yes [provider]  losartan (COZAAR) 25 MG tablet Take 0.5 tablets (12.5 mg total) by mouth daily. 12/25/19  Yes Wellington Hampshire, MD  metoprolol succinate (TOPROL-XL) 50 MG 24 hr tablet Take 1.5 tablets (75 mg total) by mouth daily. 11/05/19  Yes Visser, [name] D, PA-C  Multiple Vitamins-Minerals (MULTIVITAMIN WITH MINERALS) tablet Take 1 tablet by mouth daily.   Yes [provider]  Omeprazole (PRILOSEC PO) Take 1 tablet by mouth daily.    Yes [provider]  ticagrelor (BRILINTA) 90 MG TABS tablet Take 1 tablet (90 mg total) by mouth 2 (two) times daily. 12/17/19 12/16/20 Yes Wellington Hampshire, MD  traMADol-acetaminophen (ULTRACET) 37.5-325 MG tablet Take 1 tablet by mouth 2 (two) times daily. 09/23/19  Yes [provider]  desonide (DESOWEN) 0.05 % ointment desonide 0.05 % topical ointment  02/21/17   [provider]  nitroGLYCERIN (NITROSTAT) 0.4 MG SL tablet Place 1 tablet (0.4 mg total) under the tongue every 5 (five) minutes as needed for chest pain. Maximum of 3 doses. 01/24/20   Marrianne Mood D, PA-C    Inpatient Medications: Scheduled Meds:  Continuous Infusions: . heparin 750 Units/hr (02/25/20 1244)   PRN Meds: nitroGLYCERIN  Allergies:    Allergies  Allergen Reactions  . Corticosteroids     Other reaction(s): Headache  . Other Other (See Comments)    Seafood  . Sulfa Antibiotics Anaphylaxis  . Clarithromycin     Other reaction(s): Other (See Comments) Rectal bleeding   . Cymbalta [Duloxetine Hcl] Diarrhea  . Prednisone Other (See Comments)    Color changes in face, headache and difficulty walking    Social History:   Social History   Socioeconomic History  . Marital status: Widowed    Spouse name: Not on file  . Number of children: Not on file  . Years of education: Not on file  . Highest education level: Not on file  Occupational History  . Not on  file  Tobacco Use  . Smoking status: Never Smoker  . Smokeless tobacco: Never Used  Vaping Use  . Vaping Use: Never used  Substance and Sexual Activity  . Alcohol use: No  . Drug use: No  . Sexual activity: Not on file  Other Topics Concern  . Not on file  Social History Narrative  . Not on file   Social Determinants of Health   Financial Resource Strain: Not on file  Food Insecurity: Not on file  Transportation Needs: Not on file  Physical Activity: Not on file  Stress: Not on file  Social Connections: Not on file  Intimate Partner Violence: Not on file    Family History:   Family History  Problem Relation Age of Onset  . Heart disease Mother   . Heart attack Father   . Breast cancer Neg Hx      ROS:  Please see the history of present illness.  All other ROS reviewed and negative.     Physical Exam/Data:   Vitals:   02/25/20 1002 02/25/20 1004 02/25/20 1200  02/25/20 1210  BP:  (!) 152/83  131/72  Pulse:  81 73 69  Resp:  18 15 19   Temp:  97.8 F (36.6 C)    TempSrc:  Oral    SpO2:  100% 100% 100%  Weight: 62 kg     Height: 5\' 2"  (1.575 m)      No intake or output data in the 24 hours ending 02/25/20 1322 Last 3 Weights 02/25/2020 01/24/2020 10/24/2019  Weight (lbs) 136 lb 11 oz 137 lb 149 lb 6 oz  Weight (kg) 62 kg 62.143 kg 67.756 kg     Body mass index is 25 kg/m.  General:  Well nourished, well developed, in no acute distress HEENT: normal Lymph: no adenopathy Neck: no JVD Endocrine:  No thryomegaly Vascular: No carotid bruits; FA pulses 2+ bilaterally without bruits  Cardiac:  normal S1, S2; RRR; + murmur  Lungs:  clear to auscultation bilaterally, no wheezing, rhonchi or rales  Abd: soft, nontender, no hepatomegaly  Ext: no edema Musculoskeletal:  No deformities, BUE and BLE strength normal and equal Skin: warm and dry  Neuro:  CNs 2-12 intact, no focal abnormalities noted Psych:  Normal affect   EKG:  The EKG was personally reviewed and demonstrates:  NSR, nonspecific ST changes Telemetry:  Telemetry was personally reviewed and demonstrates:  NSR Hr 70s  Relevant CV Studies:  Cardiac catheterization 08/19/2019   2nd Diag lesion is 80% stenosed.  1st Mrg lesion is 100% stenosed.  Prox Cx to Mid Cx lesion is 60% stenosed.  Dist Cx lesion is 80% stenosed.  Mid RCA lesion is 55% stenosed.  RPAV lesion is 10% stenosed.  Balloon angioplasty was performed.  Previously placed Dist RCA drug-eluting stents are widely patent with 0% stenosed side branch in RPDA.  Post intervention, there is a 0% residual stenosis.  A drug-eluting stent was successfully placed using a STENT RESOLUTE ONYX 2.75X18.   1.  Widely patent RCA stent with normal flow distally.  Unchanged left coronary artery disease. 2.  Mildly elevated left ventricular end-diastolic pressure at 14 mmHg. 3.  Successful direct stenting of the distal left  circumflex.  Recommendations: Continue dual antiplatelet therapy for 1 year. Aggressive treatment of risk factors. If the patient remains stable, she can potentially be discharged home in the afternoon.   Coronary Diagrams   Diagnostic Dominance: Right    Intervention  Cardiac cath 08/16/19   A drug-eluting stent was successfully placed using a STENT SYNERGY DES 2.25X20.  A stent was successfully placed.   Conclusions: 1. 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 2. Moderately elevated left ventricular filling pressure. 3. Complex PCI to distal RCA, rPDA, and rPLA using Synergy 2.25 x 20 mm drug-eluting stent extending from the distal RCA into the rPDA, as well as angioplasty of the ostial rPL branch.  Intervention was complicated by no-reflow with transient hypotension/bradycardia and subsequent development of atrial fibrillation with rapid ventricular response.  Final angiogram shows 0% residual stenosis in the distal RCA/rPDA and 10% residual stenosis at ostial rPL with TIMI-2 flow.  Recommendations: 1. Transfer to ICU for aggressive medical therapy and post-PCI monitoring. 2. Continue tirofiban infusion for 18 hours. 3. Dual antiplatelet therapy with aspirin and ticagrelor for at least 12 months. 4. Titrate nitroglycerine infusion for relief of chest pain. 5. Continue amiodarone infusion for rate control and hopefully conversion to sinus rhythm.  If patient remains in atrial fibrillation tomorrow, IV heparin should be started (will defer heparin at this time given tirofiban infusion). 6. Aggressive secondary prevention. 7. Anticipate relook catheterization on Monday with PCI to the LCx.  I favor medical therapy for D2, given relatively small vessel size and need to  stent back to the LAD, were intervention undertaken.  Nelva Bush, MD [facility]    Echo limited 08/17/2019 1. Left ventricular ejection fraction, by estimation, is 60 to 65%. The  left ventricle has normal function. The left ventricle has no regional  wall motion abnormalities. There is mild concentric left ventricular  hypertrophy. Left ventricular diastolic  parameters are consistent with Grade I diastolic dysfunction (impaired  relaxation). Elevated left ventricular end-diastolic pressure.  2. Right ventricular systolic function is normal. The right ventricular  size is normal.  3. The mitral valve is normal in structure. Trivial mitral valve  regurgitation. No evidence of mitral stenosis.  4. The aortic valve is tricuspid. Aortic valve regurgitation is not  visualized. No aortic stenosis is present.   Echo complete 08/15/2019 1. Left ventricular ejection fraction, by estimation, is 55 to 60%. The  left ventricle has normal function. The left ventricle has no regional  wall motion abnormalities. There is mild left ventricular hypertrophy.  Left ventricular diastolic parameters  were normal.  2. Right ventricular systolic function is normal. The right ventricular  size is normal. There is normal pulmonary artery systolic pressure.  3. The mitral valve is normal in structure. Mild mitral valve  regurgitation. No evidence of mitral stenosis.  4. The aortic valve is abnormal. Aortic valve regurgitation is not  visualized. Mild to moderate aortic valve sclerosis/calcification is  present, without any evidence of aortic stenosis.  Laboratory Data:  High Sensitivity Troponin:   Recent Labs  Lab 02/25/20 1004 02/25/20 1210  TROPONINIHS 177* 257*     Chemistry Recent Labs  Lab 02/25/20 1004  NA 137  K 3.8  CL 103  CO2 23  GLUCOSE 126*  BUN 8  CREATININE 0.77  CALCIUM 9.5  GFRNONAA >60  ANIONGAP 11    No results for input(s): PROT, ALBUMIN, AST, ALT,  ALKPHOS, BILITOT in the last 168 hours. Hematology Recent Labs  Lab 02/25/20 1004  WBC 12.6*  RBC 4.56  HGB 15.0  HCT 44.3  MCV  97.1  MCH 32.9  MCHC 33.9  RDW 13.9  PLT 264   BNPNo results for input(s): BNP, PROBNP in the last 168 hours.  DDimer No results for input(s): DDIMER in the last 168 hours.   Radiology/Studies:  DG Chest 2 View  Result Date: 02/25/2020 CLINICAL DATA:  Chest pain EXAM: CHEST - 2 VIEW COMPARISON:  08/15/2019 FINDINGS: Stable scarring left base. Right lung clear. Interstitial markings are diffusely coarsened with chronic features. The cardiopericardial silhouette is within normal limits for size. Bones are diffusely demineralized. Thoracolumbar scoliosis evident. Nodular density/densities projecting over the lungs are compatible with pads for telemetry leads. IMPRESSION: Stable.  No active cardiopulmonary disease. Electronically Signed   By: Misty Stanley M.D.   On: 02/25/2020 10:30     Assessment and Plan:   CAD s/p prior complicated PCI to dRCA, RPDA, and RPL in 08/2019 -Presents with exertional chest pain.  Troponin elevated 177>257.  EKG with nonspecific ST changes. - Prior heart cath as above -IV heparin - continue DAPT - continue BB, statin -Plan For repeat cath. Hx of right forearm hematoma. Plan for left access site Risks and benefits of cardiac catheterization have been discussed with the patient.  These include bleeding, infection, kidney damage, stroke, heart attack, death.  The patient understands these risks and is willing to proceed. - Md to see  Post-op Afib - felt to be due to complicated PCI and acute ischemia - continue metoprolol - No recurrence since that time - EKG shows SR - continue tele  HFpEF - Repeat limited echo in 08/2019 showed LVEF 60-65% - Appears euvolemic on exam - continue BB, ARB  HLD - LDL 54 09/2019 - atorvastatin  80 mg daily  HTN - Toprol 75mg  daily and Losartan 12.5mg  daily  MR - mild to mod by  recent echo   For questions or updates, please contact [facility] Please consult [email or web address] for contact info under    Signed, Lauriana Denes Ninfa Meeker, PA-C  02/25/2020 1:22 PM

## 2020-02-25 NOTE — Brief Op Note (Signed)
BRIEF CARDIAC CATHETERIZATION NOTE  02/25/2020  4:21 PM  PATIENT:  Jatavia Nand  75 y.o. female  PRE-OPERATIVE DIAGNOSIS:  NSTEMI  POST-OPERATIVE DIAGNOSIS:  NSTEMI  PROCEDURE:  Procedure(s): CORONARY ANGIOGRAPHY (N/A)  SURGEON:  Surgeon(s) and Role:    * Atiyana Welte, MD - Primary  FINDINGS: 1. Multivessel CAD, including 80% D2 stenosis, 60% prox/mid LCx disease, CTO OM1, 99% mid RCA stenosis, and 50% rPDA lesion. 2. Widely patent LCx and distal RCA/PDA stents. 3. Normal LVEDP. 4. Successful PCI to mid RCA using Resolute Onyx 2.75 x 15 mm drug-eluting stent with 0% residual stenosis and TIMI-3 flow.  RECOMMENDATIONS: 1. DAPT with ASA and ticagrelor x 12 months. 2. Aggressive secondary prevention. 3. Obtain echo. 4. Anticipate d/c home tomorrow if no post-cath complications.  Nelva Bush, MD [facility]

## 2020-02-25 NOTE — Telephone Encounter (Signed)
Spoke with the patient. Patient reports having exertional left sided chest pressure/discomfort for > 2 weeks. Patient repots sob with the episodes. She denies n/v, diaphoresis. Patients sts that her symptoms initially were associated with exertion and improve with rest. She is now having chest discomfort w/exertion and at rest. She has not taken any Nitro. Patient sts that the her symptom have occurred daily for 2 weeks. She did not seek medical advice because she was hoping her symptoms would resolve on their own.  She is currently having chest discomfort. Adv the patient that based on her cardiac hx I would advise that she report to the ED for evaluation asap. Patient is agreeable, she will have her daughter drive her to [facility] ED now.

## 2020-02-25 NOTE — ED Provider Notes (Signed)
[facility] Emergency Department Provider Note   ____________________________________________   I have reviewed the triage vital signs and the nursing notes.   HISTORY  Chief Complaint Chest Pain   History limited by: Not Limited   HPI Cheryl Briggs is a 75 y.o. female who presents to the emergency department today because of concerns for chest pain.  Patient states that the chest pain started roughly 2 weeks ago.  She describes it as pressure.  Is located in the left side of her chest.  She states initially when it started it would only occur when she was exerting herself.  However for the past 2 mornings it has occurred at rest.  The daughter has noticed some shortness of breath.  The patient has a history of a heart attack and states that this pain reminds her of the pain she was having prior to her heart attack.   Records reviewed. Per medical record review patient has a history of NSTEMI, history of coronary stent  Past Medical History:  Diagnosis Date  . Allergy   . Arthritis   . Cancer ([place])    CLL    Patient Active Problem List   Diagnosis Date Noted  . Essential hypertension   . Atrial fibrillation with RVR ([place]) 08/16/2019  . NSTEMI (non-ST elevated myocardial infarction) ([place]) 08/15/2019  . Hematoma 08/15/2019  . 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 BACK SURGERY    . BLADDER REPAIR    . carpel tunnell Bilateral   . CHOLECYSTECTOMY    . COLONOSCOPY  2015  . CORONARY STENT INTERVENTION N/A 08/16/2019   Procedure: CORONARY STENT INTERVENTION;  Surgeon: Nelva Bush, MD;  Location: [place] [facility];  Service: Cardiovascular;  Laterality: N/A;  . CORONARY STENT INTERVENTION Left 08/19/2019   Procedure: CORONARY STENT INTERVENTION;  Surgeon: Wellington Hampshire, MD;  Location: [place] [facility];  Service: Cardiovascular;  Laterality: Left;  . LEFT HEART CATH  AND CORONARY ANGIOGRAPHY N/A 08/16/2019   Procedure: LEFT HEART CATH AND CORONARY ANGIOGRAPHY;  Surgeon: Nelva Bush, MD;  Location: [place] [facility];  Service: Cardiovascular;  Laterality: N/A;  . SHOULDER SURGERY Bilateral   . UPPER EXTREMITY ANGIOGRAPHY Right 08/21/2019   Procedure: UPPER EXTREMITY ANGIOGRAPH;  Surgeon: Algernon Huxley, MD;  Location: [place] [facility];  Service: Cardiovascular;  Laterality: Right;    Prior to Admission medications   Medication Sig Start Date End Date Taking? Authorizing Provider  aspirin EC 81 MG EC tablet Take 1 tablet (81 mg total) by mouth daily. Swallow whole. 08/19/19   Jennye Boroughs, MD  atorvastatin (LIPITOR) 80 MG tablet Take 1 tablet (80 mg total) by mouth daily. 12/17/19   Wellington Hampshire, MD  Bioflavonoid Products (ESTER C PO) Take 1,000 mg by mouth daily.    [provider]  calcium citrate-vitamin D (CITRACAL+D) 315-200 MG-UNIT tablet Take 1 tablet by mouth 2 (two) times daily.    [provider]  Chlorpheniramine Maleate (CHLOR-TABLETS PO) Take 1 tablet by mouth as needed.     [provider]  Cholecalciferol 25 MCG (1000 UT) tablet Take 3,000 Units by mouth daily.    [provider]  cyclobenzaprine (FLEXERIL) 10 MG tablet 1 tablet as needed    [provider]  desonide (DESOWEN) 0.05 % ointment desonide 0.05 % topical ointment 02/21/17   [provider]  gabapentin (NEURONTIN) 300 MG capsule Take 300 mg  by mouth 3 (three) times daily. 08/26/19   [provider]  losartan (COZAAR) 25 MG tablet Take 0.5 tablets (12.5 mg total) by mouth daily. 12/25/19   Wellington Hampshire, MD  metoprolol succinate (TOPROL-XL) 50 MG 24 hr tablet Take 1.5 tablets (75 mg total) by mouth daily. 11/05/19   Marrianne Mood D, PA-C  Multiple Vitamins-Minerals (MULTIVITAMIN WITH MINERALS) tablet Take 1 tablet by mouth daily.    [provider]  nitroGLYCERIN (NITROSTAT) 0.4 MG SL tablet Place  1 tablet (0.4 mg total) under the tongue every 5 (five) minutes as needed for chest pain. Maximum of 3 doses. 01/24/20   Marrianne Mood D, PA-C  Omeprazole (PRILOSEC PO) Take 1 tablet by mouth daily.     [provider]  ticagrelor (BRILINTA) 90 MG TABS tablet Take 1 tablet (90 mg total) by mouth 2 (two) times daily. 12/17/19 12/16/20  Wellington Hampshire, MD  traMADol-acetaminophen (ULTRACET) 37.5-325 MG tablet Take 1 tablet by mouth 2 (two) times daily. 09/23/19   [provider]    Allergies Corticosteroids, Other, Sulfa antibiotics, Clarithromycin, Cymbalta [duloxetine hcl], and Prednisone  Family History  Problem Relation Age of Onset  . Heart disease Mother   . Heart attack Father   . Breast cancer Neg Hx     Social History Social History   Tobacco Use  . Smoking status: Never Smoker  . Smokeless tobacco: Never Used  Vaping Use  . Vaping Use: Never used  Substance Use Topics  . Alcohol use: No  . Drug use: No    Review of Systems Constitutional: No fever/chills Eyes: No visual changes. ENT: No sore throat. Cardiovascular: Positive for chest pain. Respiratory: Denies shortness of breath. Gastrointestinal: No abdominal pain.  No nausea, no vomiting.  No diarrhea.   Genitourinary: Negative for dysuria. Musculoskeletal: Negative for back pain. Skin: Negative for rash. Neurological: Negative for headaches, focal weakness or numbness.  ____________________________________________   PHYSICAL EXAM:  VITAL SIGNS: ED Triage Vitals  Enc Vitals Group     BP 02/25/20 1004 (!) 152/83     Pulse Rate 02/25/20 1004 81     Resp 02/25/20 1004 18     Temp 02/25/20 1004 97.8 F (36.6 C)     Temp Source 02/25/20 1004 Oral     SpO2 02/25/20 1004 100 %     Weight 02/25/20 1002 136 lb 11 oz (62 kg)     Height 02/25/20 1002 5\' 2"  (1.575 m)     Head Circumference --      Peak Flow --      Pain Score 02/25/20 1002 10   Constitutional: Alert and oriented.  Eyes:  Conjunctivae are normal.  ENT      Head: Normocephalic and atraumatic.      Nose: No congestion/rhinnorhea.      Mouth/Throat: Mucous membranes are moist.      Neck: No stridor. Hematological/Lymphatic/Immunilogical: No cervical lymphadenopathy. Cardiovascular: Normal rate, regular rhythm.  No murmurs, rubs, or gallops.  Respiratory: Normal respiratory effort without tachypnea nor retractions. Breath sounds are clear and equal bilaterally. No wheezes/rales/rhonchi. Gastrointestinal: Soft and non tender. No rebound. No guarding.  Genitourinary: Deferred Musculoskeletal: Normal range of motion in all extremities. No lower extremity edema. Neurologic:  Normal speech and language. No gross focal neurologic deficits are appreciated.  Skin:  Skin is warm, dry and intact. No rash noted. Psychiatric: Mood and affect are normal. Speech and behavior are normal. Patient exhibits appropriate insight and judgment.  ____________________________________________  LABS (pertinent positives/negatives)  CBC wbc 12.6, hgb 15.0, plt 264 Trop hs 177 BMP wnl except glu 126 ____________________________________________   EKG  I, Nance Pear, attending physician, personally viewed and interpreted this EKG  EKG Time: 0956 Rate: 74 Rhythm: normal sinus rhythm Axis: normal Intervals: qtc 441 QRS: low voltage qrs ST changes: no st elevation Impression: abnormal ekg  ____________________________________________    RADIOLOGY  CXR No acute abnormality  ____________________________________________   PROCEDURES  Procedures  ____________________________________________   INITIAL IMPRESSION / ASSESSMENT AND PLAN / ED COURSE  Pertinent labs & imaging results that were available during my care of the patient were reviewed by me and considered in my medical decision making (see chart for details).   Patient presented to the emergency department today because of concerns for chest pain.   Patient is now experiencing chest pain at rest.  Patient's initial troponin was elevated.  EKG without any ST elevation.  I do have concerns for NSTEMI.  Will start patient on IV heparin.  Discussed findings with patient.  Will plan on admission.   ____________________________________________   FINAL CLINICAL IMPRESSION(S) / ED DIAGNOSES  Final diagnoses:  Chest pain, unspecified type  Elevated troponin     Note: This dictation was prepared with Dragon dictation. Any transcriptional errors that result from this process are unintentional     Nance Pear, MD 02/25/20 1255

## 2020-02-25 NOTE — Consult Note (Addendum)
Cardiology Consultation:   Patient ID: Cheryl Briggs MRN: [id number]; DOB: [birth year]  Admit date: 02/25/2020 Date of Consult: 02/25/2020  PCP:  Joyice Faster, [place]  Cardiologist:  Kathlyn Sacramento, MD  Advanced Practice Provider:  No care team member to display Electrophysiologist:  None    :[id number]}   Patient Profile:   Cheryl Briggs is a 75 y.o. female with a hx of CLL, arthritis, secondhand smoke exposure, CAD with significant three-vessel disease with complicated/stage PCI complicated by bradycardia/hypotension/A. fib RVR in 08/2019, postprocedure right forearm hematoma, HFpEF, hypertension, mitral regurgitation, hyperlipidemia who is being seen today for the evaluation of chest pain at the request of Dr. Tobie Poet.  History of Present Illness:   Cheryl Briggs is followed by Dr. Fletcher Anon for the above cardiac issues.  The patient has a history of CAD.  She presented to the ER in August 2021.  Echo showed EF 55 to 60%, no wall motion abnormalities, LVH, mild MR.  She underwent left heart cath 08/16/2019 that showed severe multivessel CAD including 80% D2 lesion, potential 60% proximal and 80% distal left circumflex stenosis, chronic total occlusion of OM1, 50 to 60% M RCA stenosis, thrombotic 99% distal RCA lesion extending into the RPDA.  Cath showed moderately elevated LV filling pressures.  Complex PCI was performed to the the RCA, RPDA, and RPL using DES extending from in the RCA into the arch PDA as well as angioplasty performed to the ostial RPL branch.  Distal RCA PCI was complicated by no reflow in the distal branches.  She had transient bradycardia, hypotension and A. fib RVR.  Final angiogram showed 0% stenosis in the distal RCA/RPDA and 10% residual stenosis of the ostial RPL.  It was thought the culprit of the non-STEMI elevation MI was the thrombotic lesion of the distal RCA.  She was set up for a relook cath to consider PCI to the left  circumflex.  Patient required transient atropine and nor epi infusion.  She was continued on IV amiodarone and metoprolol.  She was also treated with IV Lasix.  Repeat echo 08/17/2019 showed EF 60 to 65%.  Went repeat intervention 08/19/2019 with successful DES to the distal left circumflex.  LVEDP was mildly elevated at 14 mmHg.  Previous placed stents were patent.  Procedure she experienced right forearm hematoma with recommendation for conservative management.  She has since been seen multiple times in the cardiac office for follow-up.  She was most recently seen 01/24/2020 and was feeling better after completion of cardiac rehab. Was reporting some atypical chest pain. No changes were made.   The patient presented to the ED 02/25/2020 for chest pressure. Chest pressure started 2 weeks ago.It was worse when walking on the treadmill. It would resolve if she stopped walking. It was left sided and non-radiating. She did not take Nitro for the pain. No associated symptoms. She denies missing doses of DAPT. Denies recent fever, chills, illness, orthopnea, lower leg edema. She feels pain is similar prior to heart attack.   The ED blood pressure 152/83, pulse 81, respiratory rate 18, afebrile, 100% O2. HS troponin 177, WBC 12.6, Hgb 15.0, plt 264.  Chest x-ray unremarkable.  EKG shows sinus rhythm, 74 bpm, nonspecific ST changes was started on IV heparin and admitted.   Past Medical History:  Diagnosis Date  . Allergy   . Arthritis   . Cancer ([place])    CLL    Past Surgical History:  Procedure Laterality Date  .  BACK SURGERY    . BLADDER REPAIR    . carpel tunnell Bilateral   . CHOLECYSTECTOMY    . COLONOSCOPY  2015  . CORONARY STENT INTERVENTION N/A 08/16/2019   Procedure: CORONARY STENT INTERVENTION;  Surgeon: Nelva Bush, MD;  Location: [place] [facility];  Service: Cardiovascular;  Laterality: N/A;  . CORONARY STENT INTERVENTION Left 08/19/2019   Procedure: CORONARY STENT INTERVENTION;   Surgeon: Wellington Hampshire, MD;  Location: [place] [facility];  Service: Cardiovascular;  Laterality: Left;  . LEFT HEART CATH AND CORONARY ANGIOGRAPHY N/A 08/16/2019   Procedure: LEFT HEART CATH AND CORONARY ANGIOGRAPHY;  Surgeon: Nelva Bush, MD;  Location: [place] [facility];  Service: Cardiovascular;  Laterality: N/A;  . SHOULDER SURGERY Bilateral   . UPPER EXTREMITY ANGIOGRAPHY Right 08/21/2019   Procedure: UPPER EXTREMITY ANGIOGRAPH;  Surgeon: Algernon Huxley, MD;  Location: [place] [facility];  Service: Cardiovascular;  Laterality: Right;     Home Medications:  Prior to Admission medications   Medication Sig Start Date End Date Taking? Authorizing Provider  aspirin EC 81 MG EC tablet Take 1 tablet (81 mg total) by mouth daily. Swallow whole. 08/19/19  Yes Jennye Boroughs, MD  atorvastatin (LIPITOR) 80 MG tablet Take 1 tablet (80 mg total) by mouth daily. 12/17/19  Yes Wellington Hampshire, MD  Bioflavonoid Products (ESTER C PO) Take 1,000 mg by mouth daily.   Yes [provider]  Cholecalciferol 25 MCG (1000 UT) tablet Take 3,000 Units by mouth daily.   Yes [provider]  losartan (COZAAR) 25 MG tablet Take 0.5 tablets (12.5 mg total) by mouth daily. 12/25/19  Yes Wellington Hampshire, MD  metoprolol succinate (TOPROL-XL) 50 MG 24 hr tablet Take 1.5 tablets (75 mg total) by mouth daily. 11/05/19  Yes Visser, [name] D, PA-C  Multiple Vitamins-Minerals (MULTIVITAMIN WITH MINERALS) tablet Take 1 tablet by mouth daily.   Yes [provider]  Omeprazole (PRILOSEC PO) Take 1 tablet by mouth daily.    Yes [provider]  ticagrelor (BRILINTA) 90 MG TABS tablet Take 1 tablet (90 mg total) by mouth 2 (two) times daily. 12/17/19 12/16/20 Yes Wellington Hampshire, MD  traMADol-acetaminophen (ULTRACET) 37.5-325 MG tablet Take 1 tablet by mouth 2 (two) times daily. 09/23/19  Yes [provider]  desonide (DESOWEN) 0.05 % ointment desonide 0.05 % topical ointment  02/21/17   [provider]  nitroGLYCERIN (NITROSTAT) 0.4 MG SL tablet Place 1 tablet (0.4 mg total) under the tongue every 5 (five) minutes as needed for chest pain. Maximum of 3 doses. 01/24/20   Marrianne Mood D, PA-C    Inpatient Medications: Scheduled Meds:  Continuous Infusions: . heparin 750 Units/hr (02/25/20 1244)   PRN Meds: nitroGLYCERIN  Allergies:    Allergies  Allergen Reactions  . Corticosteroids     Other reaction(s): Headache  . Other Other (See Comments)    Seafood  . Sulfa Antibiotics Anaphylaxis  . Clarithromycin     Other reaction(s): Other (See Comments) Rectal bleeding   . Cymbalta [Duloxetine Hcl] Diarrhea  . Prednisone Other (See Comments)    Color changes in face, headache and difficulty walking    Social History:   Social History   Socioeconomic History  . Marital status: Widowed    Spouse name: Not on file  . Number of children: Not on file  . Years of education: Not on file  . Highest education level: Not on file  Occupational History  . Not on  file  Tobacco Use  . Smoking status: Never Smoker  . Smokeless tobacco: Never Used  Vaping Use  . Vaping Use: Never used  Substance and Sexual Activity  . Alcohol use: No  . Drug use: No  . Sexual activity: Not on file  Other Topics Concern  . Not on file  Social History Narrative  . Not on file   Social Determinants of Health   Financial Resource Strain: Not on file  Food Insecurity: Not on file  Transportation Needs: Not on file  Physical Activity: Not on file  Stress: Not on file  Social Connections: Not on file  Intimate Partner Violence: Not on file    Family History:   Family History  Problem Relation Age of Onset  . Heart disease Mother   . Heart attack Father   . Breast cancer Neg Hx      ROS:  Please see the history of present illness.  All other ROS reviewed and negative.     Physical Exam/Data:   Vitals:   02/25/20 1002 02/25/20 1004 02/25/20 1200  02/25/20 1210  BP:  (!) 152/83  131/72  Pulse:  81 73 69  Resp:  18 15 19   Temp:  97.8 F (36.6 C)    TempSrc:  Oral    SpO2:  100% 100% 100%  Weight: 62 kg     Height: 5\' 2"  (1.575 m)      No intake or output data in the 24 hours ending 02/25/20 1322 Last 3 Weights 02/25/2020 01/24/2020 10/24/2019  Weight (lbs) 136 lb 11 oz 137 lb 149 lb 6 oz  Weight (kg) 62 kg 62.143 kg 67.756 kg     Body mass index is 25 kg/m.  General:  Well nourished, well developed, in no acute distress HEENT: normal Lymph: no adenopathy Neck: no JVD Endocrine:  No thryomegaly Vascular: No carotid bruits; FA pulses 2+ bilaterally without bruits  Cardiac:  normal S1, S2; RRR; + murmur  Lungs:  clear to auscultation bilaterally, no wheezing, rhonchi or rales  Abd: soft, nontender, no hepatomegaly  Ext: no edema Musculoskeletal:  No deformities, BUE and BLE strength normal and equal Skin: warm and dry  Neuro:  CNs 2-12 intact, no focal abnormalities noted Psych:  Normal affect   EKG:  The EKG was personally reviewed and demonstrates:  NSR, nonspecific ST changes Telemetry:  Telemetry was personally reviewed and demonstrates:  NSR Hr 70s  Relevant CV Studies:  Cardiac catheterization 08/19/2019   2nd Diag lesion is 80% stenosed.  1st Mrg lesion is 100% stenosed.  Prox Cx to Mid Cx lesion is 60% stenosed.  Dist Cx lesion is 80% stenosed.  Mid RCA lesion is 55% stenosed.  RPAV lesion is 10% stenosed.  Balloon angioplasty was performed.  Previously placed Dist RCA drug-eluting stents are widely patent with 0% stenosed side branch in RPDA.  Post intervention, there is a 0% residual stenosis.  A drug-eluting stent was successfully placed using a STENT RESOLUTE ONYX 2.75X18.   1.  Widely patent RCA stent with normal flow distally.  Unchanged left coronary artery disease. 2.  Mildly elevated left ventricular end-diastolic pressure at 14 mmHg. 3.  Successful direct stenting of the distal left  circumflex.  Recommendations: Continue dual antiplatelet therapy for 1 year. Aggressive treatment of risk factors. If the patient remains stable, she can potentially be discharged home in the afternoon.   Coronary Diagrams   Diagnostic Dominance: Right    Intervention  Cardiac cath 08/16/19   A drug-eluting stent was successfully placed using a STENT SYNERGY DES 2.25X20.  A stent was successfully placed.   Conclusions: 1. 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 2. Moderately elevated left ventricular filling pressure. 3. Complex PCI to distal RCA, rPDA, and rPLA using Synergy 2.25 x 20 mm drug-eluting stent extending from the distal RCA into the rPDA, as well as angioplasty of the ostial rPL branch.  Intervention was complicated by no-reflow with transient hypotension/bradycardia and subsequent development of atrial fibrillation with rapid ventricular response.  Final angiogram shows 0% residual stenosis in the distal RCA/rPDA and 10% residual stenosis at ostial rPL with TIMI-2 flow.  Recommendations: 1. Transfer to ICU for aggressive medical therapy and post-PCI monitoring. 2. Continue tirofiban infusion for 18 hours. 3. Dual antiplatelet therapy with aspirin and ticagrelor for at least 12 months. 4. Titrate nitroglycerine infusion for relief of chest pain. 5. Continue amiodarone infusion for rate control and hopefully conversion to sinus rhythm.  If patient remains in atrial fibrillation tomorrow, IV heparin should be started (will defer heparin at this time given tirofiban infusion). 6. Aggressive secondary prevention. 7. Anticipate relook catheterization on Monday with PCI to the LCx.  I favor medical therapy for D2, given relatively small vessel size and need to  stent back to the LAD, were intervention undertaken.  Nelva Bush, MD [facility]    Echo limited 08/17/2019 1. Left ventricular ejection fraction, by estimation, is 60 to 65%. The  left ventricle has normal function. The left ventricle has no regional  wall motion abnormalities. There is mild concentric left ventricular  hypertrophy. Left ventricular diastolic  parameters are consistent with Grade I diastolic dysfunction (impaired  relaxation). Elevated left ventricular end-diastolic pressure.  2. Right ventricular systolic function is normal. The right ventricular  size is normal.  3. The mitral valve is normal in structure. Trivial mitral valve  regurgitation. No evidence of mitral stenosis.  4. The aortic valve is tricuspid. Aortic valve regurgitation is not  visualized. No aortic stenosis is present.   Echo complete 08/15/2019 1. Left ventricular ejection fraction, by estimation, is 55 to 60%. The  left ventricle has normal function. The left ventricle has no regional  wall motion abnormalities. There is mild left ventricular hypertrophy.  Left ventricular diastolic parameters  were normal.  2. Right ventricular systolic function is normal. The right ventricular  size is normal. There is normal pulmonary artery systolic pressure.  3. The mitral valve is normal in structure. Mild mitral valve  regurgitation. No evidence of mitral stenosis.  4. The aortic valve is abnormal. Aortic valve regurgitation is not  visualized. Mild to moderate aortic valve sclerosis/calcification is  present, without any evidence of aortic stenosis.  Laboratory Data:  High Sensitivity Troponin:   Recent Labs  Lab 02/25/20 1004 02/25/20 1210  TROPONINIHS 177* 257*     Chemistry Recent Labs  Lab 02/25/20 1004  NA 137  K 3.8  CL 103  CO2 23  GLUCOSE 126*  BUN 8  CREATININE 0.77  CALCIUM 9.5  GFRNONAA >60  ANIONGAP 11    No results for input(s): PROT, ALBUMIN, AST, ALT,  ALKPHOS, BILITOT in the last 168 hours. Hematology Recent Labs  Lab 02/25/20 1004  WBC 12.6*  RBC 4.56  HGB 15.0  HCT 44.3  MCV  97.1  MCH 32.9  MCHC 33.9  RDW 13.9  PLT 264   BNPNo results for input(s): BNP, PROBNP in the last 168 hours.  DDimer No results for input(s): DDIMER in the last 168 hours.   Radiology/Studies:  DG Chest 2 View  Result Date: 02/25/2020 CLINICAL DATA:  Chest pain EXAM: CHEST - 2 VIEW COMPARISON:  08/15/2019 FINDINGS: Stable scarring left base. Right lung clear. Interstitial markings are diffusely coarsened with chronic features. The cardiopericardial silhouette is within normal limits for size. Bones are diffusely demineralized. Thoracolumbar scoliosis evident. Nodular density/densities projecting over the lungs are compatible with pads for telemetry leads. IMPRESSION: Stable.  No active cardiopulmonary disease. Electronically Signed   By: Misty Stanley M.D.   On: 02/25/2020 10:30     Assessment and Plan:   CAD s/p prior complicated PCI to dRCA, RPDA, and RPL in 08/2019 -Presents with exertional chest pain.  Troponin elevated 177>257.  EKG with nonspecific ST changes. - Prior heart cath as above -IV heparin - continue DAPT - continue BB, statin -Plan For repeat cath. Hx of right forearm hematoma. Plan for left access site Risks and benefits of cardiac catheterization have been discussed with the patient.  These include bleeding, infection, kidney damage, stroke, heart attack, death.  The patient understands these risks and is willing to proceed. - Md to see  Post-op Afib - felt to be due to complicated PCI and acute ischemia - continue metoprolol - No recurrence since that time - EKG shows SR - continue tele  HFpEF - Repeat limited echo in 08/2019 showed LVEF 60-65% - Appears euvolemic on exam - continue BB, ARB  HLD - LDL 54 09/2019 - atorvastatin  80 mg daily  HTN - Toprol 75mg  daily and Losartan 12.5mg  daily  MR - mild to mod by  recent echo   For questions or updates, please contact [facility] Please consult [email or web address] for contact info under    Signed, Amos Micheals Ninfa Meeker, PA-C  02/25/2020 1:22 PM

## 2020-02-25 NOTE — ED Notes (Signed)
MD at bedside. 

## 2020-02-25 NOTE — ED Notes (Signed)
Called lab and informed them to please run COVID swab that was collected earlier stat.  Per Lab their machine is down and as soon as it comes up they will run it stat.  Will inform RN Arby Barrette and Selinda Eon RN

## 2020-02-26 ENCOUNTER — Encounter: Payer: Self-pay | Admitting: Internal Medicine

## 2020-02-26 ENCOUNTER — Telehealth: Payer: Self-pay

## 2020-02-26 DIAGNOSIS — I214 Non-ST elevation (NSTEMI) myocardial infarction: Principal | ICD-10-CM

## 2020-02-26 DIAGNOSIS — C9110 Chronic lymphocytic leukemia of B-cell type not having achieved remission: Secondary | ICD-10-CM

## 2020-02-26 DIAGNOSIS — I10 Essential (primary) hypertension: Secondary | ICD-10-CM | POA: Diagnosis not present

## 2020-02-26 DIAGNOSIS — K219 Gastro-esophageal reflux disease without esophagitis: Secondary | ICD-10-CM

## 2020-02-26 DIAGNOSIS — E785 Hyperlipidemia, unspecified: Secondary | ICD-10-CM | POA: Diagnosis not present

## 2020-02-26 LAB — CBC
HCT: 42.1 % (ref 36.0–46.0)
Hemoglobin: 14 g/dL (ref 12.0–15.0)
MCH: 32.6 pg (ref 26.0–34.0)
MCHC: 33.3 g/dL (ref 30.0–36.0)
MCV: 98.1 fL (ref 80.0–100.0)
Platelets: 253 10*3/uL (ref 150–400)
RBC: 4.29 MIL/uL (ref 3.87–5.11)
RDW: 14.2 % (ref 11.5–15.5)
WBC: 13 10*3/uL — ABNORMAL HIGH (ref 4.0–10.5)
nRBC: 0 % (ref 0.0–0.2)

## 2020-02-26 LAB — BASIC METABOLIC PANEL
Anion gap: 6 (ref 5–15)
BUN: 9 mg/dL (ref 8–23)
CO2: 26 mmol/L (ref 22–32)
Calcium: 9.4 mg/dL (ref 8.9–10.3)
Chloride: 107 mmol/L (ref 98–111)
Creatinine, Ser: 0.7 mg/dL (ref 0.44–1.00)
GFR, Estimated: >60 mL/min (ref >60)
Glucose, Bld: 120 mg/dL — ABNORMAL HIGH (ref 70–99)
Potassium: 4.8 mmol/L (ref 3.5–5.1)
Sodium: 139 mmol/L (ref 135–145)

## 2020-02-26 LAB — ECHOCARDIOGRAM COMPLETE
AR max vel: 1.44 cm2
AV Area VTI: 1.66 cm2
AV Area mean vel: 1.56 cm2
AV Mean grad: 7.5 mmHg
AV Peak grad: 13.8 mmHg
Ao pk vel: 1.86 m/s
Area-P 1/2: 1.78 cm2
Height: 62 in
S' Lateral: 1.84 cm
Weight: 2186.96 oz

## 2020-02-26 MED ORDER — LOSARTAN POTASSIUM 25 MG PO TABS: 12.5000 mg | ORAL_TABLET | Freq: Every day | ORAL | 0 refills | Status: DC

## 2020-02-26 MED ORDER — ATORVASTATIN CALCIUM 80 MG PO TABS: 80.0000 mg | ORAL_TABLET | Freq: Every day | ORAL | 0 refills | Status: DC

## 2020-02-26 MED ORDER — TICAGRELOR 90 MG PO TABS: 90.0000 mg | ORAL_TABLET | Freq: Two times a day (BID) | ORAL | 0 refills | Status: AC

## 2020-02-26 NOTE — Plan of Care (Signed)

## 2020-02-26 NOTE — Discharge Summary (Signed)
[place] [facility] [place] NAME: Cheryl Briggs    MR#:  [id number]  DATE OF BIRTH:  1946-01-03  DATE OF ADMISSION:  02/25/2020 ADMITTING PHYSICIAN: Amy N Cox, DO  DATE OF DISCHARGE: 02/26/2020 11:24 AM  PRIMARY CARE PHYSICIAN: Joyice Faster, FNP    ADMISSION DIAGNOSIS:  Elevated troponin [R77.8] Chest pain [R07.9] Chest pain, unspecified type [R07.9]  DISCHARGE DIAGNOSIS:  Active Problems:   CLL (chronic lymphocytic leukemia) (HCC)   NSTEMI (non-ST elevated myocardial infarction) [facility])   Essential hypertension   Chest pain   Neutrophilic leukocytosis   SECONDARY DIAGNOSIS:   Past Medical History:  Diagnosis Date  . Allergy   . Arthritis   . Cancer ([place])    CLL    HOSPITAL COURSE:   1.  NSTEMI.  She was initially was placed on heparin drip.  She is already on aspirin, Brilinta and Lipitor.  Dr. Saunders Revel did a cardiac catheterization on 02/25/2019 and a successful PCI to the mid RCA was done.  Patient was feeling better the following day.  The patient improved quicker than expected and was stable for discharge home.  She stated she needed a refill on the Brilinta and Lipitor which I refilled. 2.  Essential hypertension.  On low-dose losartan.  I refilled that also. 3.  Hyperlipidemia unspecified on high intensity atorvastatin.  Previous LDL 54 4.  GERD on Protonix 5.  History of CLL  DISCHARGE CONDITIONS:   Satisfactory  CONSULTS OBTAINED:  Treatment Team:  Nelva Bush, MD  DRUG ALLERGIES:   Allergies  Allergen Reactions  . Corticosteroids     Other reaction(s): Headache  . Other Other (See Comments)    Seafood  . Sulfa Antibiotics Anaphylaxis  . Clarithromycin     Other reaction(s): Other (See Comments) Rectal bleeding   . Cymbalta [Duloxetine Hcl] Diarrhea  . Prednisone Other (See Comments)    Color changes in face, headache and difficulty walking    DISCHARGE MEDICATIONS:   Allergies as of 02/26/2020       Reactions   Corticosteroids    Other reaction(s): Headache   Other Other (See Comments)   Seafood   Sulfa Antibiotics Anaphylaxis   Clarithromycin    Other reaction(s): Other (See Comments) Rectal bleeding   Cymbalta [duloxetine Hcl] Diarrhea   Prednisone Other (See Comments)   Color changes in face, headache and difficulty walking      Medication List    TAKE these medications   aspirin 81 MG EC tablet Take 1 tablet (81 mg total) by mouth daily. Swallow whole.   atorvastatin 80 MG tablet Commonly known as: LIPITOR Take 1 tablet (80 mg total) by mouth daily.   Cholecalciferol 25 MCG (1000 UT) tablet Take 3,000 Units by mouth daily.   desonide 0.05 % ointment Commonly known as: DESOWEN desonide 0.05 % topical ointment   ESTER C PO Take 1,000 mg by mouth daily.   losartan 25 MG tablet Commonly known as: COZAAR Take 0.5 tablets (12.5 mg total) by mouth daily.   metoprolol succinate 50 MG 24 hr tablet Commonly known as: TOPROL-XL Take 1.5 tablets (75 mg total) by mouth daily.   multivitamin with minerals tablet Take 1 tablet by mouth daily.   nitroGLYCERIN 0.4 MG SL tablet Commonly known as: Nitrostat Place 1 tablet (0.4 mg total) under the tongue every 5 (five) minutes as needed for chest pain. Maximum of 3 doses.   PRILOSEC PO Take 1 tablet by mouth daily.  ticagrelor 90 MG Tabs tablet Commonly known as: BRILINTA Take 1 tablet (90 mg total) by mouth 2 (two) times daily.   traMADol-acetaminophen 37.5-325 MG tablet Commonly known as: ULTRACET Take 1 tablet by mouth 2 (two) times daily.        DISCHARGE INSTRUCTIONS:   Follow-up PMD 5 days Follow-up cardiology 1 week Follow-up cardiac rehab  If you experience worsening of your admission symptoms, develop shortness of breath, life threatening emergency, suicidal or homicidal thoughts you must seek medical attention immediately by calling 911 or calling your MD immediately  if symptoms less  severe.  You Must read complete instructions/literature along with all the possible adverse reactions/side effects for all the Medicines you take and that have been prescribed to you. Take any new Medicines after you have completely understood and accept all the possible adverse reactions/side effects.   Please note  You were cared for by a hospitalist during your hospital stay. If you have any questions about your discharge medications or the care you received while you were in the hospital after you are discharged, you can call the unit and asked to speak with the hospitalist on call if the hospitalist that took care of you is not available. Once you are discharged, your primary care physician will handle any further medical issues. Please note that NO REFILLS for any discharge medications will be authorized once you are discharged, as it is imperative that you return to your primary care physician (or establish a relationship with a primary care physician if you do not have one) for your aftercare needs so that they can reassess your need for medications and monitor your lab values.    Today   CHIEF COMPLAINT:   Chief Complaint  Patient presents with  . Chest Pain    HISTORY OF PRESENT ILLNESS:  Cheryl Briggs  is a 75 y.o. female came in with chest pain   VITAL SIGNS:  Blood pressure 139/84, pulse 85, temperature 98.8 F (37.1 C), temperature source Oral, resp. rate 18, height 5\' 2"  (1.575 m), weight 63.5 kg, SpO2 95 %.  I/O:    Intake/Output Summary (Last 24 hours) at 02/26/2020 1635 Last data filed at 02/26/2020 0930 Gross per 24 hour  Intake 360 ml  Output --  Net 360 ml    PHYSICAL EXAMINATION:  GENERAL:  75 y.o.-year-old patient lying in the bed with no acute distress.  EYES: Pupils equal, round, reactive to light and accommodation. No scleral icterus.   HEENT: Head atraumatic, normocephalic. Oropharynx and nasopharynx clear.  LUNGS: Normal breath sounds  bilaterally, no wheezing, rales,rhonchi or crepitation. No use of accessory muscles of respiration.  CARDIOVASCULAR: S1, S2 normal. No murmurs, rubs, or gallops.  ABDOMEN: Soft, non-tender, non-distended.  EXTREMITIES: No pedal edema.  NEUROLOGIC: Cranial nerves II through XII are intact. Muscle strength 5/5 in all extremities. Sensation intact. Gait not checked.  PSYCHIATRIC: The patient is alert and oriented x 3.  SKIN: No obvious rash, lesion, or ulcer.   DATA REVIEW:   CBC Recent Labs  Lab 02/26/20 0633  WBC 13.0*  HGB 14.0  HCT 42.1  PLT 253    Chemistries  Recent Labs  Lab 02/26/20 0633  NA 139  K 4.8  CL 107  CO2 26  GLUCOSE 120*  BUN 9  CREATININE 0.70  CALCIUM 9.4     Microbiology Results  Results for orders placed or performed during the hospital encounter of 02/25/20  Resp Panel by RT-PCR (Flu A&B, Covid) Nasopharyngeal  Swab     Status: None   Collection Time: 02/25/20 12:11 PM   Specimen: Nasopharyngeal Swab; Nasopharyngeal(NP) swabs in vial transport medium  Result Value Ref Range Status   SARS Coronavirus 2 by RT PCR NEGATIVE NEGATIVE Final    Comment: (NOTE) SARS-CoV-2 target nucleic acids are NOT DETECTED.  The SARS-CoV-2 RNA is generally detectable in upper respiratory specimens during the acute phase of infection. The lowest concentration of SARS-CoV-2 viral copies this assay can detect is 138 copies/mL. A negative result does not preclude SARS-Cov-2 infection and should not be used as the sole basis for treatment or other patient management decisions. A negative result may occur with  improper specimen collection/handling, submission of specimen other than nasopharyngeal swab, presence of viral mutation(s) within the areas targeted by this assay, and inadequate number of viral copies(<138 copies/mL). A negative result must be combined with clinical observations, patient history, and epidemiological information. The expected result is  Negative.  Fact Sheet for Patients:  EntrepreneurPulse.com.au  Fact Sheet for Healthcare Providers:  IncredibleEmployment.be  This test is no t yet approved or cleared by the [place] FDA and  has been authorized for detection and/or diagnosis of SARS-CoV-2 by FDA under an Emergency Use Authorization (EUA). This EUA will remain  in effect (meaning this test can be used) for the duration of the COVID-19 declaration under Section 564(b)(1) of the Act, 21 U.S.C.section 360bbb-3(b)(1), unless the authorization is terminated  or revoked sooner.       Influenza A by PCR NEGATIVE NEGATIVE Final   Influenza B by PCR NEGATIVE NEGATIVE Final    Comment: (NOTE) The Xpert Xpress SARS-CoV-2/FLU/RSV plus assay is intended as an aid in the diagnosis of influenza from Nasopharyngeal swab specimens and should not be used as a sole basis for treatment. Nasal washings and aspirates are unacceptable for Xpert Xpress SARS-CoV-2/FLU/RSV testing.  Fact Sheet for Patients: EntrepreneurPulse.com.au  Fact Sheet for Healthcare Providers: IncredibleEmployment.be  This test is not yet approved or cleared by the [place] FDA and has been authorized for detection and/or diagnosis of SARS-CoV-2 by FDA under an Emergency Use Authorization (EUA). This EUA will remain in effect (meaning this test can be used) for the duration of the COVID-19 declaration under Section 564(b)(1) of the Act, 21 U.S.C. section 360bbb-3(b)(1), unless the authorization is terminated or revoked.  Performed at [facility], [address] [place], [place] [address]     RADIOLOGY:  DG Chest 2 View  Result Date: 02/25/2020 CLINICAL DATA:  Chest pain EXAM: CHEST - 2 VIEW COMPARISON:  08/15/2019 FINDINGS: Stable scarring left base. Right lung clear. Interstitial markings are diffusely coarsened with chronic features. The cardiopericardial  silhouette is within normal limits for size. Bones are diffusely demineralized. Thoracolumbar scoliosis evident. Nodular density/densities projecting over the lungs are compatible with pads for telemetry leads. IMPRESSION: Stable.  No active cardiopulmonary disease. Electronically Signed   By: Misty Stanley M.D.   On: 02/25/2020 10:30   CARDIAC CATHETERIZATION  Result Date: 02/25/2020 Conclusions: 1. Multivessel coronary artery disease, including 80% D2 stenosis, 60% prox/mid LCx disease, chronic total occlusion of OM1, 99% mid RCA stenosis, and 50% rPDA lesion. 2. Widely patent LCx and distal RCA/PDA stents. 3. Normal left ventricular filling pressure. 4. Successful PCI to mid RCA using Resolute Onyx 2.75 x 15 mm drug-eluting stent with 0% residual stenosis and TIMI-3 flow.  Recommendations: 1. Continue dual antiplatelet therapy with aspirin and ticagrelor x 12 months. 2. Aggressive secondary prevention. 3. Obtain echocardiogram.  4. Anticipate discharge home tomorrow if no post-cath complications. Nelva Bush, MD [facility]  ECHOCARDIOGRAM COMPLETE  Result Date: 02/26/2020    ECHOCARDIOGRAM REPORT   Patient Name:   Cheryl Briggs Date of Exam: 02/25/2020 Medical Rec #:  [id number]         Height:       62.0 in Accession #:    [id number]        Weight:       136.7 lb Date of Birth:  January 25, 1945         BSA:          1.626 m Patient Age:    75 years          BP:           118/73 mmHg Patient Gender: F                 HR:           69 bpm. Exam Location:  [facility] Procedure: 2D Echo, Cardiac Doppler and Color Doppler Indications:     I21.9 Acute Myocardial infarction.  History:         Patient has prior history of Echocardiogram examinations, most                  recent 08/17/2019.  Sonographer:     Wilford Sports Rodgers-Jones Referring Phys:  [id number] CHRISTOPHER END Diagnosing Phys: Nelva Bush MD IMPRESSIONS  1. Left ventricular ejection fraction, by estimation, is 55 to 60%. The left ventricle has normal  function. Left ventricular endocardial border not optimally defined to evaluate regional wall motion. There is mild left ventricular hypertrophy. Left ventricular diastolic parameters are consistent with Grade I diastolic dysfunction (impaired relaxation).  2. Right ventricular systolic function is mildly reduced. The right ventricular size is normal. Mildly increased right ventricular wall thickness. There is normal pulmonary artery systolic pressure.  3. The mitral valve is normal in structure. Mild mitral valve regurgitation. No evidence of mitral stenosis.  4. The aortic valve is tricuspid. There is mild calcification of the aortic valve. There is mild thickening of the aortic valve. Aortic valve regurgitation is not visualized. Mild to moderate aortic valve sclerosis/calcification is present, without any evidence of aortic stenosis.  5. The inferior vena cava is normal in size with <50% respiratory variability, suggesting right atrial pressure of 8 mmHg. FINDINGS  Left Ventricle: Left ventricular ejection fraction, by estimation, is 55 to 60%. The left ventricle has normal function. Left ventricular endocardial border not optimally defined to evaluate regional wall motion. The left ventricular internal cavity size was normal in size. There is mild left ventricular hypertrophy. Left ventricular diastolic parameters are consistent with Grade I diastolic dysfunction (impaired relaxation). Right Ventricle: The right ventricular size is normal. Mildly increased right ventricular wall thickness. Right ventricular systolic function is mildly reduced. There is normal pulmonary artery systolic pressure. The tricuspid regurgitant velocity is 1.89 m/s, and with an assumed right atrial pressure of 8 mmHg, the estimated right ventricular systolic pressure is 66.0 mmHg. Left Atrium: Left atrial size was normal in size. Right Atrium: Right atrial size was normal in size. Pericardium: There is no evidence of pericardial  effusion. Presence of pericardial fat pad. Mitral Valve: The mitral valve is normal in structure. Mild mitral annular calcification. Mild mitral valve regurgitation. No evidence of mitral valve stenosis. Tricuspid Valve: The tricuspid valve is normal in structure. Tricuspid valve regurgitation is trivial. Aortic Valve: The aortic valve is tricuspid. There is mild  calcification of the aortic valve. There is mild thickening of the aortic valve. Aortic valve regurgitation is not visualized. Mild to moderate aortic valve sclerosis/calcification is present, without any evidence of aortic stenosis. Aortic valve mean gradient measures 7.5 mmHg. Aortic valve peak gradient measures 13.8 mmHg. Aortic valve area, by VTI measures 1.66 cm. Pulmonic Valve: The pulmonic valve was not well visualized. Pulmonic valve regurgitation is not visualized. No evidence of pulmonic stenosis. Aorta: The aortic root is normal in size and structure. Pulmonary Artery: The pulmonary artery is of normal size. Venous: The inferior vena cava is normal in size with less than 50% respiratory variability, suggesting right atrial pressure of 8 mmHg. IAS/Shunts: The interatrial septum was not well visualized.  LEFT VENTRICLE PLAX 2D LVIDd:         3.01 cm  Diastology LVIDs:         1.84 cm  LV e' medial:    4.90 cm/s LV PW:         1.12 cm  LV E/e' medial:  10.4 LV IVS:        1.19 cm  LV e' lateral:   8.49 cm/s LVOT diam:     2.00 cm  LV E/e' lateral: 6.0 LV SV:         58 LV SV Index:   36 LVOT Area:     3.14 cm  RIGHT VENTRICLE             IVC RV Basal diam:  3.21 cm     IVC diam: 1.23 cm RV S prime:     11.20 cm/s TAPSE (M-mode): 1.6 cm LEFT ATRIUM             Index       RIGHT ATRIUM          Index LA diam:        3.50 cm 2.15 cm/m  RA Area:     9.43 cm LA Vol (A2C):   29.1 ml 17.90 ml/m RA Volume:   20.00 ml 12.30 ml/m LA Vol (A4C):   19.9 ml 12.24 ml/m LA Biplane Vol: 24.9 ml 15.31 ml/m  AORTIC VALVE AV Area (Vmax):    1.44 cm AV Area  (Vmean):   1.56 cm AV Area (VTI):     1.66 cm AV Vmax:           185.96 cm/s AV Vmean:          128.132 cm/s AV VTI:            0.352 m AV Peak Grad:      13.8 mmHg AV Mean Grad:      7.5 mmHg LVOT Vmax:         85.05 cm/s LVOT Vmean:        63.550 cm/s LVOT VTI:          0.186 m LVOT/AV VTI ratio: 0.53  AORTA Ao Root diam: 3.10 cm Ao Asc diam:  3.10 cm MITRAL VALVE                TRICUSPID VALVE MV Area (PHT): 1.78 cm     TR Peak grad:   14.3 mmHg MV Decel Time: 426 msec     TR Vmax:        189.00 cm/s MV E velocity: 50.90 cm/s MV A velocity: 100.00 cm/s  SHUNTS MV E/A ratio:  0.51         Systemic VTI:  0.19 m  Systemic Diam: 2.00 cm Nelva Bush MD Electronically signed by Nelva Bush MD Signature Date/Time: 02/26/2020/8:48:13 AM    Final      Management plans discussed with the patient, and she is agreement.  CODE STATUS:  Code Status History    Date Active Date Inactive Code Status Order ID Comments User Context   02/25/2020 1331 02/26/2020 1624 Partial Code [id number]  CoxBriant Cedar, DO ED   02/25/2020 1250 02/25/2020 1331 Partial Code [id number]  CoxBriant Cedar, DO ED   08/15/2019 1315 08/22/2019 1743 Full Code [id number]  Collier Bullock, MD ED   Advance Care Planning Activity    Questions for Most Recent Historical Code Status (Order [id number])    Question Answer   In the event of cardiac or respiratory ARREST: Initiate Code Blue, Call Rapid Response Yes   In the event of cardiac or respiratory ARREST: Perform CPR Yes   In the event of cardiac or respiratory ARREST: Perform Intubation/Mechanical Ventilation No   In the event of cardiac or respiratory ARREST: Use NIPPV/BiPAp only if indicated Yes   In the event of cardiac or respiratory ARREST: Administer ACLS medications if indicated Yes   In the event of cardiac or respiratory ARREST: Perform Defibrillation or Cardioversion if indicated Yes        Advance Directive Documentation   Flowsheet Row Most Recent  Value  Type of Advance Directive Healthcare Power of Attorney  Pre-existing out of facility DNR order (yellow form or pink MOST form) --  "MOST" Form in Place? --      TOTAL TIME TAKING CARE OF THIS PATIENT: 34 minutes.    Loletha Grayer M.D on 02/26/2020 at 4:35 PM  Between 7am to 6pm - Pager - [phone]  After 6pm go to [email or web address] - password [email or web address]  [facility]  CC: Primary care physician; Joyice Faster, FNP

## 2020-02-26 NOTE — Telephone Encounter (Signed)
-----   Message from [place], PA-C sent at 02/26/2020  9:38 AM EST ----- Regarding: hospital follow-up Pt admitted for NSTEMI. Pt needs TOC follow-up in 2 weeks. Please call and schedule. Can be with APP.   Thanks!

## 2020-02-26 NOTE — Progress Notes (Signed)
Progress Note  Patient Name: Cheryl Briggs Date of Encounter: 02/26/2020  [facility] Cardiologist: Kathlyn Sacramento, MD   Subjective   S/p PCI with DES to [facility]. Echo with normal LVEF. Patient denies recurrent chest pain. Cath site, left radial, is stable. Possible discharge today.   Inpatient Medications    Scheduled Meds: . aspirin  81 mg Oral Daily  . atorvastatin  80 mg Oral Daily  . cholecalciferol  3,000 Units Oral Daily  . enoxaparin (LOVENOX) injection  40 mg Subcutaneous Q24H  . losartan  12.5 mg Oral Daily  . metoprolol succinate  75 mg Oral Daily  . pantoprazole  40 mg Oral Daily  . sodium chloride flush  3 mL Intravenous Q12H  . sodium chloride flush  3 mL Intravenous Q12H  . ticagrelor  90 mg Oral BID   Continuous Infusions: . sodium chloride     PRN Meds: sodium chloride, acetaminophen **OR** acetaminophen, nitroGLYCERIN, ondansetron **OR** ondansetron (ZOFRAN) IV, sodium chloride flush   Vital Signs    Vitals:   02/25/20 1920 02/26/20 0440 02/26/20 0517 02/26/20 0805  BP:   (!) 127/58 139/84  Pulse: 84   85  Resp: 20  17 18   Temp:   98.7 F (37.1 C) 98.8 F (37.1 C)  TempSrc:   Oral Oral  SpO2: 97%  96% 95%  Weight:  63.5 kg    Height:       No intake or output data in the 24 hours ending 02/26/20 0853 Last 3 Weights 02/26/2020 02/25/2020 01/24/2020  Weight (lbs) 140 lb 136 lb 11 oz 137 lb  Weight (kg) 63.504 kg 62 kg 62.143 kg      Telemetry    NSR, HR 70-80s - Personally Reviewed  ECG    No new - Personally Reviewed  Physical Exam   GEN: No acute distress.   Neck: No JVD Cardiac: RRR, no murmurs, rubs, or gallops.  Respiratory: Clear to auscultation bilaterally. GI: Soft, nontender, non-distended  MS: No edema; No deformity. Neuro:  Nonfocal  Psych: Normal affect   Labs    High Sensitivity Troponin:   Recent Labs  Lab 02/25/20 1004 02/25/20 1210  TROPONINIHS 177* 257*      Chemistry Recent Labs  Lab  02/25/20 1004 02/26/20 0633  NA 137 139  K 3.8 4.8  CL 103 107  CO2 23 26  GLUCOSE 126* 120*  BUN 8 9  CREATININE 0.77 0.70  CALCIUM 9.5 9.4  GFRNONAA >60 >60  ANIONGAP 11 6     Hematology Recent Labs  Lab 02/25/20 1004 02/26/20 0633  WBC 12.6* 13.0*  RBC 4.56 4.29  HGB 15.0 14.0  HCT 44.3 42.1  MCV 97.1 98.1  MCH 32.9 32.6  MCHC 33.9 33.3  RDW 13.9 14.2  PLT 264 253    BNPNo results for input(s): BNP, PROBNP in the last 168 hours.   DDimer No results for input(s): DDIMER in the last 168 hours.   Radiology    DG Chest 2 View  Result Date: 02/25/2020 CLINICAL DATA:  Chest pain EXAM: CHEST - 2 VIEW COMPARISON:  08/15/2019 FINDINGS: Stable scarring left base. Right lung clear. Interstitial markings are diffusely coarsened with chronic features. The cardiopericardial silhouette is within normal limits for size. Bones are diffusely demineralized. Thoracolumbar scoliosis evident. Nodular density/densities projecting over the lungs are compatible with pads for telemetry leads. IMPRESSION: Stable.  No active cardiopulmonary disease. Electronically Signed   By: Misty Stanley M.D.   On: 02/25/2020 10:30  CARDIAC CATHETERIZATION  Result Date: 02/25/2020 Conclusions: 1. Multivessel coronary artery disease, including 80% D2 stenosis, 60% prox/mid LCx disease, chronic total occlusion of OM1, 99% mid RCA stenosis, and 50% rPDA lesion. 2. Widely patent LCx and distal RCA/PDA stents. 3. Normal left ventricular filling pressure. 4. Successful PCI to mid RCA using Resolute Onyx 2.75 x 15 mm drug-eluting stent with 0% residual stenosis and TIMI-3 flow.  Recommendations: 1. Continue dual antiplatelet therapy with aspirin and ticagrelor x 12 months. 2. Aggressive secondary prevention. 3. Obtain echocardiogram. 4. Anticipate discharge home tomorrow if no post-cath complications. Nelva Bush, MD [facility]  ECHOCARDIOGRAM COMPLETE  Result Date: 02/26/2020    ECHOCARDIOGRAM REPORT    Patient Name:   Cheryl Briggs Date of Exam: 02/25/2020 Medical Rec #:  [id number]         Height:       62.0 in Accession #:    [id number]        Weight:       136.7 lb Date of Birth:  04/18/1945         BSA:          1.626 m Patient Age:    75 years          BP:           118/73 mmHg Patient Gender: F                 HR:           69 bpm. Exam Location:  [facility] Procedure: 2D Echo, Cardiac Doppler and Color Doppler Indications:     I21.9 Acute Myocardial infarction.  History:         Patient has prior history of Echocardiogram examinations, most                  recent 08/17/2019.  Sonographer:     Wilford Sports Rodgers-Jones Referring Phys:  [id number] CHRISTOPHER END Diagnosing Phys: Nelva Bush MD IMPRESSIONS  1. Left ventricular ejection fraction, by estimation, is 55 to 60%. The left ventricle has normal function. Left ventricular endocardial border not optimally defined to evaluate regional wall motion. There is mild left ventricular hypertrophy. Left ventricular diastolic parameters are consistent with Grade I diastolic dysfunction (impaired relaxation).  2. Right ventricular systolic function is mildly reduced. The right ventricular size is normal. Mildly increased right ventricular wall thickness. There is normal pulmonary artery systolic pressure.  3. The mitral valve is normal in structure. Mild mitral valve regurgitation. No evidence of mitral stenosis.  4. The aortic valve is tricuspid. There is mild calcification of the aortic valve. There is mild thickening of the aortic valve. Aortic valve regurgitation is not visualized. Mild to moderate aortic valve sclerosis/calcification is present, without any evidence of aortic stenosis.  5. The inferior vena cava is normal in size with <50% respiratory variability, suggesting right atrial pressure of 8 mmHg. FINDINGS  Left Ventricle: Left ventricular ejection fraction, by estimation, is 55 to 60%. The left ventricle has normal function. Left ventricular endocardial  border not optimally defined to evaluate regional wall motion. The left ventricular internal cavity size was normal in size. There is mild left ventricular hypertrophy. Left ventricular diastolic parameters are consistent with Grade I diastolic dysfunction (impaired relaxation). Right Ventricle: The right ventricular size is normal. Mildly increased right ventricular wall thickness. Right ventricular systolic function is mildly reduced. There is normal pulmonary artery systolic pressure. The tricuspid regurgitant velocity is 1.89 m/s, and with an assumed right atrial pressure  of 8 mmHg, the estimated right ventricular systolic pressure is 24.0 mmHg. Left Atrium: Left atrial size was normal in size. Right Atrium: Right atrial size was normal in size. Pericardium: There is no evidence of pericardial effusion. Presence of pericardial fat pad. Mitral Valve: The mitral valve is normal in structure. Mild mitral annular calcification. Mild mitral valve regurgitation. No evidence of mitral valve stenosis. Tricuspid Valve: The tricuspid valve is normal in structure. Tricuspid valve regurgitation is trivial. Aortic Valve: The aortic valve is tricuspid. There is mild calcification of the aortic valve. There is mild thickening of the aortic valve. Aortic valve regurgitation is not visualized. Mild to moderate aortic valve sclerosis/calcification is present, without any evidence of aortic stenosis. Aortic valve mean gradient measures 7.5 mmHg. Aortic valve peak gradient measures 13.8 mmHg. Aortic valve area, by VTI measures 1.66 cm. Pulmonic Valve: The pulmonic valve was not well visualized. Pulmonic valve regurgitation is not visualized. No evidence of pulmonic stenosis. Aorta: The aortic root is normal in size and structure. Pulmonary Artery: The pulmonary artery is of normal size. Venous: The inferior vena cava is normal in size with less than 50% respiratory variability, suggesting right atrial pressure of 8 mmHg.  IAS/Shunts: The interatrial septum was not well visualized.  LEFT VENTRICLE PLAX 2D LVIDd:         3.01 cm  Diastology LVIDs:         1.84 cm  LV e' medial:    4.90 cm/s LV PW:         1.12 cm  LV E/e' medial:  10.4 LV IVS:        1.19 cm  LV e' lateral:   8.49 cm/s LVOT diam:     2.00 cm  LV E/e' lateral: 6.0 LV SV:         58 LV SV Index:   36 LVOT Area:     3.14 cm  RIGHT VENTRICLE             IVC RV Basal diam:  3.21 cm     IVC diam: 1.23 cm RV S prime:     11.20 cm/s TAPSE (M-mode): 1.6 cm LEFT ATRIUM             Index       RIGHT ATRIUM          Index LA diam:        3.50 cm 2.15 cm/m  RA Area:     9.43 cm LA Vol (A2C):   29.1 ml 17.90 ml/m RA Volume:   20.00 ml 12.30 ml/m LA Vol (A4C):   19.9 ml 12.24 ml/m LA Biplane Vol: 24.9 ml 15.31 ml/m  AORTIC VALVE AV Area (Vmax):    1.44 cm AV Area (Vmean):   1.56 cm AV Area (VTI):     1.66 cm AV Vmax:           185.96 cm/s AV Vmean:          128.132 cm/s AV VTI:            0.352 m AV Peak Grad:      13.8 mmHg AV Mean Grad:      7.5 mmHg LVOT Vmax:         85.05 cm/s LVOT Vmean:        63.550 cm/s LVOT VTI:          0.186 m LVOT/AV VTI ratio: 0.53  AORTA Ao Root diam: 3.10 cm Ao Asc diam:  3.10  cm MITRAL VALVE                TRICUSPID VALVE MV Area (PHT): 1.78 cm     TR Peak grad:   14.3 mmHg MV Decel Time: 426 msec     TR Vmax:        189.00 cm/s MV E velocity: 50.90 cm/s MV A velocity: 100.00 cm/s  SHUNTS MV E/A ratio:  0.51         Systemic VTI:  0.19 m                             Systemic Diam: 2.00 cm Nelva Bush MD Electronically signed by Nelva Bush MD Signature Date/Time: 02/26/2020/8:48:13 AM    Final     Cardiac Studies   Echo 02/25/20 1. Left ventricular ejection fraction, by estimation, is 55 to 60%. The  left ventricle has normal function. Left ventricular endocardial border  not optimally defined to evaluate regional wall motion. There is mild left  ventricular hypertrophy. Left  ventricular diastolic parameters are consistent  with Grade I diastolic  dysfunction (impaired relaxation).  2. Right ventricular systolic function is mildly reduced. The right  ventricular size is normal. Mildly increased right ventricular wall  thickness. There is normal pulmonary artery systolic pressure.  3. The mitral valve is normal in structure. Mild mitral valve  regurgitation. No evidence of mitral stenosis.  4. The aortic valve is tricuspid. There is mild calcification of the  aortic valve. There is mild thickening of the aortic valve. Aortic valve  regurgitation is not visualized. Mild to moderate aortic valve  sclerosis/calcification is present, without any  evidence of aortic stenosis.  5. The inferior vena cava is normal in size with <50% respiratory  variability, suggesting right atrial pressure of 8 mmHg.  Cardiac cath 02/25/20  Conclusions: 1. Multivessel coronary artery disease, including 80% D2 stenosis, 60% prox/mid LCx disease, chronic total occlusion of OM1, 99% mid RCA stenosis, and 50% rPDA lesion. 2. Widely patent LCx and distal RCA/PDA stents. 3. Normal left ventricular filling pressure. 4. Successful PCI to mid RCA using Resolute Onyx 2.75 x 15 mm drug-eluting stent with 0% residual stenosis and TIMI-3 flow.  Recommendations: 1. Continue dual antiplatelet therapy with aspirin and ticagrelor x 12 months. 2. Aggressive secondary prevention. 3. Obtain echocardiogram. 4. Anticipate discharge home tomorrow if no post-cath complications.  Nelva Bush, MD [facility]    Recommendations  Antiplatelet/Anticoag Recommend uninterrupted dual antiplatelet therapy with Aspirin 81mg  daily and Ticagrelor 90mg  twice daily.  Discharge Date In the absence of any other complications or medical issues, we expect the patient to be ready for discharge on 02/26/2020.    Coronary Diagrams   Diagnostic Dominance: Right    Intervention        Patient Profile     75 y.o. female  with a hx of CLL,  arthritis, secondhand smoke exposure, CAD with significant three-vessel disease with complicated/stage PCI complicated by bradycardia/hypotension/A. fib RVR in 08/2019, postprocedure right forearm hematoma, HFpEF, hypertension, mitral regurgitation, hyperlipidemia who is being seen today for the evaluation of chest pain  Assessment & Plan    NSTEMI CAD s/p prior complicated PCI to dRCA, RPDA, and RPL in 08/2019 -Presents with exertional chest pain.  Troponin elevated 177>257.  EKG with nonspecific ST changes. Started on IV heparin and taken to the cath lab - Cath showed culprit lesions 99% mRCA stenosis treated with PCI/DES x 1. Other  stents were patent. Also with residual disease as above. - continue DAPT with Aspirin and Brilinta x 12 months - Echo showed preserved LVEF 55-60%, mild LVH, G1DD, mildly reduced RV function - continue BB, statin - Cath site, left radial, is stable - No further chest pain.   Post-op Afib - felt to be due to complicated PCI and acute ischemia - continue metoprolol - No recurrence since that time - EKG shows SR  HFpEF - Echo showed LVEF 55-60% with G1DD - Appears euvolemic on exam - continue BB, ARB  HLD - LDL 54 09/2019 - atorvastatin  80 mg daily  HTN - Toprol 75mg  daily and Losartan 12.5mg  daily  MR - mild by echo   For questions or updates, please contact [facility] Please consult [email or web address] for contact info under        Signed, Chana Lindstrom Ninfa Meeker, PA-C  02/26/2020, 8:53 AM

## 2020-02-27 NOTE — Telephone Encounter (Signed)
Patient contacted regarding discharge from [facility] on 02/26/20.  Patient understands to follow up with provider Mickle Plumb, [place] on 03/12/20 at 11 am at [facility]. Patient understands discharge instructions? Yes  Patient understands medications and regiment? yes Patient understands to bring all medications to this visit? yes  Ask patient:  Are you enrolled in My Chart (No - not at this time. )

## 2020-03-05 ENCOUNTER — Ambulatory Visit: Payer: Medicare Other | Admitting: Physician Assistant

## 2020-03-12 ENCOUNTER — Ambulatory Visit (INDEPENDENT_AMBULATORY_CARE_PROVIDER_SITE_OTHER): Payer: Medicare Other | Admitting: Medical

## 2020-03-12 ENCOUNTER — Encounter: Payer: Self-pay | Admitting: Physician Assistant

## 2020-03-12 ENCOUNTER — Other Ambulatory Visit: Payer: Self-pay

## 2020-03-12 VITALS — BP 130/70 | HR 78 | Ht 62.0 in | Wt 134.0 lb

## 2020-03-12 DIAGNOSIS — I5032 Chronic diastolic (congestive) heart failure: Secondary | ICD-10-CM

## 2020-03-12 DIAGNOSIS — I10 Essential (primary) hypertension: Secondary | ICD-10-CM | POA: Diagnosis not present

## 2020-03-12 DIAGNOSIS — I25119 Atherosclerotic heart disease of native coronary artery with unspecified angina pectoris: Secondary | ICD-10-CM

## 2020-03-12 DIAGNOSIS — I480 Paroxysmal atrial fibrillation: Secondary | ICD-10-CM

## 2020-03-12 DIAGNOSIS — I340 Nonrheumatic mitral (valve) insufficiency: Secondary | ICD-10-CM

## 2020-03-12 DIAGNOSIS — E785 Hyperlipidemia, unspecified: Secondary | ICD-10-CM

## 2020-03-12 MED ORDER — ISOSORBIDE MONONITRATE ER 30 MG PO TB24: 15.0000 mg | ORAL_TABLET | Freq: Every day | ORAL | 3 refills | Status: DC

## 2020-03-12 NOTE — Patient Instructions (Signed)
Medication Instructions:  Your physician has recommended you make the following change in your medication:   START Isosorbide 15mg  DAILY - Rx sent for Isosorbide 30mg  take HALF tablet DAILY  *If you need a refill on your cardiac medications before your next appointment, please call your pharmacy*   Lab Work: None ordered   Testing/Procedures: None ordered   Follow-Up: At [facility], you and your health needs are our priority.  As part of our continuing mission to provide you with exceptional heart care, we have created designated Provider Care Teams.  These Care Teams include your primary Cardiologist (physician) and Advanced Practice Providers (APPs -  Physician Assistants and Nurse Practitioners) who all work together to provide you with the care you need, when you need it.  We recommend signing up for the patient portal called "MyChart".  Sign up information is provided on this After Visit Summary.  MyChart is used to connect with patients for Virtual Visits (Telemedicine).  Patients are able to view lab/test results, encounter notes, upcoming appointments, etc.  Non-urgent messages can be sent to your provider as well.   To learn more about what you can do with MyChart, go to [email or web address].    Your next appointment:   1 week(s)  The format for your next appointment:   In Person  Provider:    You may see Marrianne Mood, PA OR [place], [place]

## 2020-03-12 NOTE — Progress Notes (Signed)
Cardiology Office Note:    Date:  03/12/2020   ID:  Cheryl Briggs, DOB 1945/03/20, MRN [id number]  PCP:  Joyice Faster, FNP  [facility] Cardiologist:  Kathlyn Sacramento, MD  [place] Electrophysiologist:  None   Referring MD: Joyice Faster, FNP   Chief Complaint: Hospital follow-up  History of Present Illness:    Cheryl Briggs is a 75 y.o. female with a hx of CLL, arthritis, secondhand smoke exposure, CAD with significant three-vessel disease with complicated/stage PCI complicated by bradycardia/hypotension/A. fib RVR in August 2021, postprocedure right forearm hematoma, HFpEF, hypertension, mitral valve regurgitation, hyperlipidemia who is being seen for hospital follow-up.  Patient was recently admitted for non-STEMI 2/15- 2/16.  Cath showed culprit lesion 99% M RCA stenosis treated with PCI/DES x1.  Other stents were patent.  Also showed residual disease.  Was started on DAPT with aspirin and Brilinta for 12 months.  Echo showed LVEF 55 to 60%, mild LVH, grade 1 diastolic dysfunction, mildly reduced RV function.  Cath site left radial was stable at discharge.  Today, the patient reports she has been having episodes of chest pain and pressure. It is center to left sided and constant. Can last 5 minutes or up to 1 day.  It feels like a pressure and then a steady ache. Exertion does not make it worse. Pain has gotten up to 5/10. No associated sob, N/V, diaphoresis. Last week she had chest discomfort about every day, although she hasn't taken SL NTG.  Patient has also been very stressed, multiple close people passing away in the last 6 months. This morning walking into the appointment and felt abnormally sob. Prior to her admission she was walking 1-2 miles daily. She has been walking half a mile occasionally throughout the week. Not currently sob. No active chest pain. She has been eating and drinking normally. She plans on going to the senior center instead of  cardiac  rehab since getting there is an hour. Bps at home 120/70s. Cath site, left radial, healing great. She reports compliance with ASA and Brilinta.   Of note cath showed 60% prox/mid LCx disease.  Past Medical History:  Diagnosis Date  . Allergy   . Arthritis   . Cancer ([place])    CLL    Past Surgical History:  Procedure Laterality Date  . BACK SURGERY    . BLADDER REPAIR    . CARDIAC CATHETERIZATION    . carpel tunnell Bilateral   . CHOLECYSTECTOMY    . COLONOSCOPY  2015  . CORONARY STENT INTERVENTION N/A 08/16/2019   Procedure: CORONARY STENT INTERVENTION;  Surgeon: Nelva Bush, MD;  Location: [place] [facility];  Service: Cardiovascular;  Laterality: N/A;  . CORONARY STENT INTERVENTION Left 08/19/2019   Procedure: CORONARY STENT INTERVENTION;  Surgeon: Wellington Hampshire, MD;  Location: [place] [facility];  Service: Cardiovascular;  Laterality: Left;  . CORONARY STENT INTERVENTION N/A 02/25/2020   Procedure: CORONARY STENT INTERVENTION;  Surgeon: Nelva Bush, MD;  Location: [place] [facility];  Service: Cardiovascular;  Laterality: N/A;  . LEFT HEART CATH AND CORONARY ANGIOGRAPHY N/A 08/16/2019   Procedure: LEFT HEART CATH AND CORONARY ANGIOGRAPHY;  Surgeon: Nelva Bush, MD;  Location: [place] [facility];  Service: Cardiovascular;  Laterality: N/A;  . LEFT HEART CATH AND CORONARY ANGIOGRAPHY N/A 02/25/2020   Procedure: LEFT HEART CATH AND CORONARY ANGIOGRAPHY;  Surgeon: Nelva Bush, MD;  Location: [place] [facility];  Service: Cardiovascular;  Laterality: N/A;  . SHOULDER SURGERY Bilateral   .  UPPER EXTREMITY ANGIOGRAPHY Right 08/21/2019   Procedure: UPPER EXTREMITY ANGIOGRAPH;  Surgeon: Algernon Huxley, MD;  Location: [place] [facility];  Service: Cardiovascular;  Laterality: Right;    Current Medications: Current Meds  Medication Sig  . aspirin EC 81 MG EC tablet Take 1 tablet (81 mg total) by mouth daily. Swallow whole.  Marland Kitchen atorvastatin (LIPITOR) 80 MG tablet  Take 1 tablet (80 mg total) by mouth daily.  Marland Kitchen Bioflavonoid Products (ESTER C PO) Take 1,000 mg by mouth daily.  . Cholecalciferol 25 MCG (1000 UT) tablet Take 3,000 Units by mouth daily.  Marland Kitchen desonide (DESOWEN) 0.05 % ointment desonide 0.05 % topical ointment  . isosorbide mononitrate (IMDUR) 30 MG 24 hr tablet Take 0.5 tablets (15 mg total) by mouth daily.  Marland Kitchen losartan (COZAAR) 25 MG tablet Take 0.5 tablets (12.5 mg total) by mouth daily.  . metoprolol succinate (TOPROL-XL) 50 MG 24 hr tablet Take 1.5 tablets (75 mg total) by mouth daily.  . Multiple Vitamins-Minerals (MULTIVITAMIN WITH MINERALS) tablet Take 1 tablet by mouth daily.  . nitroGLYCERIN (NITROSTAT) 0.4 MG SL tablet Place 1 tablet (0.4 mg total) under the tongue every 5 (five) minutes as needed for chest pain. Maximum of 3 doses.  . Omeprazole (PRILOSEC PO) Take 1 tablet by mouth daily.   . ticagrelor (BRILINTA) 90 MG TABS tablet Take 1 tablet (90 mg total) by mouth 2 (two) times daily.  . traMADol-acetaminophen (ULTRACET) 37.5-325 MG tablet Take 1 tablet by mouth 2 (two) times daily.     Allergies:   Corticosteroids, Other, Sulfa antibiotics, Clarithromycin, Cymbalta [duloxetine hcl], and Prednisone   Social History   Socioeconomic History  . Marital status: Widowed    Spouse name: Not on file  . Number of children: Not on file  . Years of education: Not on file  . Highest education level: Not on file  Occupational History  . Not on file  Tobacco Use  . Smoking status: Never Smoker  . Smokeless tobacco: Never Used  Vaping Use  . Vaping Use: Never used  Substance and Sexual Activity  . Alcohol use: No  . Drug use: No  . Sexual activity: Not on file  Other Topics Concern  . Not on file  Social History Narrative  . Not on file   Social Determinants of Health   Financial Resource Strain: Not on file  Food Insecurity: Not on file  Transportation Needs: Not on file  Physical Activity: Not on file  Stress: Not on  file  Social Connections: Not on file     Family History: The patient's family history includes Heart attack in her father; Heart disease in her mother. There is no history of Breast cancer.  ROS:   Please see the history of present illness.     All other systems reviewed and are negative.  EKGs/Labs/Other Studies Reviewed:    The following studies were reviewed today:  Echo 02/2020  1. Left ventricular ejection fraction, by estimation, is 55 to 60%. The  left ventricle has normal function. Left ventricular endocardial border  not optimally defined to evaluate regional wall motion. There is mild left  ventricular hypertrophy. Left  ventricular diastolic parameters are consistent with Grade I diastolic  dysfunction (impaired relaxation).  2. Right ventricular systolic function is mildly reduced. The right  ventricular size is normal. Mildly increased right ventricular wall  thickness. There is normal pulmonary artery systolic pressure.  3. The mitral valve is normal in structure. Mild mitral valve  regurgitation. No evidence of mitral stenosis.  4. The aortic valve is tricuspid. There is mild calcification of the  aortic valve. There is mild thickening of the aortic valve. Aortic valve  regurgitation is not visualized. Mild to moderate aortic valve  sclerosis/calcification is present, without any  evidence of aortic stenosis.  5. The inferior vena cava is normal in size with <50% respiratory  variability, suggesting right atrial pressure of 8 mmHg.   Cardiac cath 02/2020 Conclusions: 1. Multivessel coronary artery disease, including 80% D2 stenosis, 60% prox/mid LCx disease, chronic total occlusion of OM1, 99% mid RCA stenosis, and 50% rPDA lesion. 2. Widely patent LCx and distal RCA/PDA stents. 3. Normal left ventricular filling pressure. 4. Successful PCI to mid RCA using Resolute Onyx 2.75 x 15 mm drug-eluting stent with 0% residual stenosis and TIMI-3  flow.  Recommendations: 1. Continue dual antiplatelet therapy with aspirin and ticagrelor x 12 months. 2. Aggressive secondary prevention. 3. Obtain echocardiogram. 4. Anticipate discharge home tomorrow if no post-cath complications.  Nelva Bush, MD [facility]   Coronary Diagrams   Diagnostic Dominance: Right    Intervention       EKG:  EKG is  ordered today.  The ekg ordered today demonstrates SR, 78bpm, nonspecific ST changes  Recent Labs: 08/17/2019: Magnesium 2.1 02/25/2020: TSH 1.114 02/26/2020: BUN 9; Creatinine, Ser 0.70; Hemoglobin 14.0; Platelets 253; Potassium 4.8; Sodium 139  Recent Lipid Panel    Component Value Date/Time   CHOL 139 09/24/2019 1152   TRIG 138 09/24/2019 1152   HDL 61 09/24/2019 1152   CHOLHDL 2.3 09/24/2019 1152   CHOLHDL 2.7 08/15/2019 1024   VLDL 33 08/15/2019 1024   LDLCALC 54 09/24/2019 1152   LDLDIRECT 51 09/24/2019 1152     Physical Exam:    VS:  BP 130/70 (BP Location: Left Arm, Patient Position: Sitting, Cuff Size: Normal)   Pulse 78   Ht 5\' 2"  (1.575 m)   Wt 134 lb (60.8 kg)   SpO2 97%   BMI 24.51 kg/m     Wt Readings from Last 3 Encounters:  03/12/20 134 lb (60.8 kg)  02/26/20 140 lb (63.5 kg)  01/24/20 137 lb (62.1 kg)     GEN: Well nourished, well developed in no acute distress HEENT: Normal NECK: No JVD; No carotid bruits LYMPHATICS: No lymphadenopathy CARDIAC: RRR, no murmurs, rubs, gallops RESPIRATORY:  Clear to auscultation without rales, wheezing or rhonchi  ABDOMEN: Soft, non-tender, non-distended MUSCULOSKELETAL:  No edema; No deformity  SKIN: Warm and dry NEUROLOGIC:  Alert and oriented x 3 PSYCHIATRIC:  Normal affect   ASSESSMENT:    1. Coronary artery disease involving native coronary artery of native heart with angina pectoris ([place])   2. Essential hypertension   3. Paroxysmal atrial fibrillation (HCC)   4. Mild mitral insufficiency   5. Hyperlipidemia LDL goal <70   6. Chronic  diastolic heart failure (H[facility])    PLAN:    In order of problems listed above:  Recent NSTEMI with DES to [facility] CAD s/p prior complicated PCI to dRCA, RPDA, and RPL in 08/2019 Reporting chest pressure/pain since the discharge. It has typical and atypical features, although has not been severe enough to take SL NTG. She reports compliance with DAPT. She has been walking around her house, maybe half a mile at at time and generally feels fine with that. She had some sob walking in the office today. Appears euvolemic on exam. Cath site healing well. Given recent multiple stents low threshold  for repeat cath, however patient would like to avoid this. EKG shows no new ischemic changes. I will try Imdur 15mg  daily and see patient back in a week.  If patient is still having chest pain/pressure at follow-up might have to consider repeat cath. Continue DAPT with aspirin and brilinta. She denies bleeding issues. Continue Toprol and statin.   Post-op Afib  This occurred in August 9518 after complicated PCI procedure. Afib felt to be due to ischemia and complicated PCI. No recurrence since that time.  HFpEF Recent echo showed LVEF 55-60% with g1dd. Euvolemic on exam today. Continue BB and ARB.   HLD LDL 54 in 09/2019. Can repeat labs at follow-up. Continue Atorvastatin.   HTN BP good today. Continue Toprol and Losartan  MR Mild by recent echo. No significant murmur on exam.  Disposition: Follow up in 1 week(s) with APP    Signed, Cadence Ninfa Meeker, PA-C  03/12/2020 1:12 PM    [facility]

## 2020-03-15 NOTE — Progress Notes (Unsigned)
Office Visit   Patient Name: Cheryl Briggs Date of Encounter: 03/16/2020  PCP:  Joyice Faster, [place]  Cardiologist:  Kathlyn Sacramento, MD  Advanced Practice Provider:  Arvil Chaco, PA-C Electrophysiologist:  None   Chief Complaint    Chief Complaint  Patient presents with  . Other    1 week follow up - Patient c.o chest pain and SOB - Saturday being the worse day. Meds reviewed verbally with patient.     75 y.o. female with a hx of CLL, arthritis, secondhand smoke exposure, non-STEMI with subsequent LHC 08/2019 and significant 3v CAD and complicated/staged PCI (06/11/68 dRCA, 08/19/19 dLCx), PCI on 8/6 complicated by no reflow and bradycardia/hypotension/atrial fibrillation with RVR, post procedure right forearm hematoma, 2/15/222 NSTEMI and PCI to [facility]. HFpEF, hypertension, mitral regurgitation, hyperlipidemia, and here for 1 week RTC after ongoing CP s/p NSTEMI and PCI to [facility] 02/25/20.   Past Medical History    Past Medical History:  Diagnosis Date  . Allergy   . Arthritis   . Cancer ([place])    CLL   Past Surgical History:  Procedure Laterality Date  . BACK SURGERY    . BLADDER REPAIR    . CARDIAC CATHETERIZATION    . carpel tunnell Bilateral   . CHOLECYSTECTOMY    . COLONOSCOPY  2015  . CORONARY STENT INTERVENTION N/A 08/16/2019   Procedure: CORONARY STENT INTERVENTION;  Surgeon: Nelva Bush, MD;  Location: [place] [facility];  Service: Cardiovascular;  Laterality: N/A;  . CORONARY STENT INTERVENTION Left 08/19/2019   Procedure: CORONARY STENT INTERVENTION;  Surgeon: Wellington Hampshire, MD;  Location: [place] [facility];  Service: Cardiovascular;  Laterality: Left;  . CORONARY STENT INTERVENTION N/A 02/25/2020   Procedure: CORONARY STENT INTERVENTION;  Surgeon: Nelva Bush, MD;  Location: [place] [facility];  Service: Cardiovascular;  Laterality: N/A;  . LEFT HEART CATH AND CORONARY ANGIOGRAPHY N/A 08/16/2019    Procedure: LEFT HEART CATH AND CORONARY ANGIOGRAPHY;  Surgeon: Nelva Bush, MD;  Location: [place] [facility];  Service: Cardiovascular;  Laterality: N/A;  . LEFT HEART CATH AND CORONARY ANGIOGRAPHY N/A 02/25/2020   Procedure: LEFT HEART CATH AND CORONARY ANGIOGRAPHY;  Surgeon: Nelva Bush, MD;  Location: [place] [facility];  Service: Cardiovascular;  Laterality: N/A;  . SHOULDER SURGERY Bilateral   . UPPER EXTREMITY ANGIOGRAPHY Right 08/21/2019   Procedure: UPPER EXTREMITY ANGIOGRAPH;  Surgeon: Algernon Huxley, MD;  Location: [place] [facility];  Service: Cardiovascular;  Laterality: Right;    Allergies  Allergies  Allergen Reactions  . Corticosteroids     Other reaction(s): Headache  . Other Other (See Comments)    Seafood  . Sulfa Antibiotics Anaphylaxis  . Clarithromycin     Other reaction(s): Other (See Comments) Rectal bleeding   . Cymbalta [Duloxetine Hcl] Diarrhea  . Prednisone Other (See Comments)    Color changes in face, headache and difficulty walking    History of Present Illness    Cheryl Briggs is a 75 y.o. female with PMH as above. She has reported exposure to secondhand smoke for approximately 20 years via her husband.   Before her 08/2019 NSTEMI, she felt 10/10 chest pain and pressure in her center of her chest with associated shortness of breath, diaphoresis, nausea, emesis x1, and dizziness.  She had decreased exercise tolerance. LHC 08/16/2019 showed severe multivessel CAD with moderately elevated LV filling pressures.  Complex PCI was performed to the dRCA,  rPDA, and rPL using DES extending from the dRCA into the rPDA, as well as angioplasty performed of the ostial rPL branch.  Distal RCA PCI was complicated by no reflow in the distal branches.  Final angiogram showed 0% stenosis in the distal RCA/RPDA and 10% residual stenosis of the ostial RPL. It was thought that the culprit of her non-ST elevation myocardial infarction was the thrombotic lesion of  the distal RCA.    She had transient bradycardia and hypotension with development of atrial fibrillation, thought 2/2 complicated PCI and mediated by acute ischemia, as well as administration of atropine and norepinephrine. She was started on amiodarone (afib) and lasix (elevated filling pressures).  She underwent repeat intervention 08/19/2019 with PCI to distal left circumflex and elevated LVEDP.Post procedure, she experienced a right forearm hematoma with recommendation for conservative management.  After discharge, she was seen in clinic several times and noted she was overall doing well; however, fatigue and dyspnea did not improve until after starting cardiac rehab. On  01/24/2020, she noted significant increase in exercise tolerance. She continued to try and remain active.She planned to walk on the treadmill at the senior center. She could walk as quickly as 3 miles per hour. She was taking tramadol and gabapentin for joint pain. She had atypical sharp chest pain, though improved in frequency. It occured mainly in the morning at rest and with exertion without other clear triggers.    On 02/25/20, she presented to [facility] with worsening angina x2 weeks that as now present at rest. The CP had initially been worse when walking on the treadmill and would feel like left sided non radiating CP then resolve with rest.  It was described as similar to her prior heart attack. Tn mildly elevated.  Cath showed multivessel CAD with 80%D2s, 60%p-m LCx, chronic total occlusion of OM1, 99% mRCA stenosis and 50% rPDA. Nl LVEDP. PCI performed to [facility]. Plan was for ASA and Brilinta for 44mo. Repeat echo LVEF 55-60%, G1DD, mildly reduced RV function.   She was last seen in clinic after her NSTEMI with episodes of CP and pressure reported and described as center to L sided and constant pain/pressure/ache, lasting 66m - 24h. Pain was not made worse with exertion and rated up to 5/10 in severity. The week leading up to her visit,  she had CP almost every day. She had not taken SL nitro. She reported feeling very stressed, as multiple people had passed away in the last 6 months. On the way into her visit, she felt SOB. Prior to admission, she was walking 1-2 miles daily. She was walking 1/2 mile occasionally throughout the week. She was going to the senior center instead of cardiac rehab, due to the hour commute. Home BP 120/70s.  Today, 03/16/2020, she returns to clinic and notes ongoing symptoms of chest pressure and dyspnea.  Chest pressure seems to be worse in the mornings.  She reports current chest discomfort during her visit today with stat troponin taken.  She reports chest pain usually starts at the beginning of the day when she is exerting herself and continues throughout the day, though will be less intense.  She reports her worst episode occurred the Saturday while grocery shopping.  Rated 7/10 in severity.  She became increasingly short of breath with chest discomfort.  She did end up taking 1 sublingual nitro, which made her dizzy, but it did improve her chest pain.  She denies any syncope or loss of consciousness.  She reports feeling  her heart bouncing/awareness of her heart but is uncertain if this was due to palpitations.  She has not noticed any signs or symptoms of volume overload.  No reported signs or symptoms of bleeding.  She reports that she has not been able to increase activity, due to her ongoing symptoms.  Her energy level have also been significantly decreased.  She confirmed that she wishes to defer cardiac rehab and instead work out at the senior center, due to the commute.  She has been monitoring BP at home with BP 102-122/70s, HR 70-80s.   Home Medications    Prior to Admission medications   Medication Sig Start Date End Date Taking? Authorizing Provider  aspirin EC 81 MG EC tablet Take 1 tablet (81 mg total) by mouth daily. Swallow whole. 08/19/19  Yes Jennye Boroughs, MD  atorvastatin (LIPITOR) 80 MG  tablet Take 1 tablet (80 mg total) by mouth daily. 08/19/19  Yes Jennye Boroughs, MD  Bioflavonoid Products (ESTER C PO) Take 1,000 mg by mouth daily.   Yes [provider]  calcium citrate-vitamin D (CITRACAL+D) 315-200 MG-UNIT tablet Take 1 tablet by mouth 2 (two) times daily.   Yes [provider]  Chlorpheniramine Maleate (CHLOR-TABLETS PO) Take 1 tablet by mouth as needed.    Yes [provider]  Cholecalciferol (VITAMIN D-1000 MAX ST) 25 MCG (1000 UT) tablet Take 3,000 Units by mouth daily.   Yes [provider]  desonide (DESOWEN) 0.05 % ointment desonide 0.05 % topical ointment 02/21/17  Yes [provider]  gabapentin (NEURONTIN) 300 MG capsule Take 300 mg by mouth 3 (three) times daily. 08/26/19  Yes [provider]  metoprolol succinate (TOPROL-XL) 50 MG 24 hr tablet Take 1.5 tablets (75 mg total) by mouth daily. 08/20/19  Yes Jennye Boroughs, MD  Multiple Vitamins-Minerals (MULTIVITAMIN WITH MINERALS) tablet Take 1 tablet by mouth daily.   Yes [provider]  Omeprazole (PRILOSEC PO) Take 1 tablet by mouth daily.    Yes [provider]  ticagrelor (BRILINTA) 90 MG TABS tablet Take 1 tablet (90 mg total) by mouth 2 (two) times daily. 08/19/19 08/18/20 Yes Jennye Boroughs, MD    Review of Systems    She reports ongoing chest pain/pressure and dyspnea, as well as decreased energy.  She had an episode of bouncing heart rate.  She reports dizziness with taking nitro.  No pnd, orthopnea, n, v, syncope, weight gain, or early satiety.  She reports joint pain, and that she has started on tramadol and gabapentin. All other systems reviewed and are otherwise negative except as noted above.  Physical Exam    VS:  BP (!) 128/52 (BP Location: Left Arm, Patient Position: Sitting, Cuff Size: Normal)   Pulse 86   Ht 5\' 2"  (1.575 m)   Wt 136 lb (61.7 kg)   SpO2 97%   BMI 24.87 kg/m  , BMI Body mass index is 24.87 kg/m. GEN: Well  nourished, well developed, in no acute distress. HEENT: normal. Neck: Supple, no JVD, carotid bruits, or masses. Cardiac: RRR, 1/6 systolic murmur in right upper sternal border, rubs, or gallops. No clubbing, cyanosis, edema.  Left radial arteriotomy site without any signs of infection and left radial pulse intact.  Left radial arteriotomy site without any edema or hematoma.  R radial 1+.     Respiratory:  Respirations regular and unlabored, clear to auscultation bilaterally. GI: Soft, nontender, nondistended, BS + x 4. MS: no deformity or atrophy. Skin: warm and dry, no  rash. Neuro:  Strength and sensation are intact. Psych: Normal affect.  Accessory Clinical Findings    ECG personally reviewed by me today -NSR, 86 bpm, occasional PVCs, poor R wave progression in inferior leads, T wave inversion in inferior III, aVF.  EKG reviewed with DOD, Dr. Rockey Situ- no acute changes.  VITALS Reviewed today   Temp Readings from Last 3 Encounters:  02/26/20 98.8 F (37.1 C) (Oral)  08/22/19 97.8 F (36.6 C) (Oral)  04/29/19 (!) 97.5 F (36.4 C) (Tympanic)   BP Readings from Last 3 Encounters:  03/16/20 (!) 128/52  03/12/20 130/70  02/26/20 139/84   Pulse Readings from Last 3 Encounters:  03/16/20 86  03/12/20 78  02/26/20 85    Wt Readings from Last 3 Encounters:  03/16/20 136 lb (61.7 kg)  03/12/20 134 lb (60.8 kg)  02/26/20 140 lb (63.5 kg)     LABS  reviewed today   Lab Results  Component Value Date   WBC 12.6 (H) 03/16/2020   HGB 13.6 03/16/2020   HCT 41.0 03/16/2020   MCV 99.0 03/16/2020   PLT 285 03/16/2020   Lab Results  Component Value Date   CREATININE 0.70 03/16/2020   BUN 7 (L) 03/16/2020   NA 138 03/16/2020   K 3.9 03/16/2020   CL 104 03/16/2020   CO2 23 03/16/2020   Lab Results  Component Value Date   ALT 29 10/06/2017   AST 36 10/06/2017   ALKPHOS 75 10/06/2017   BILITOT 0.6 10/06/2017   Lab Results  Component Value Date   CHOL 139 09/24/2019   HDL  61 09/24/2019   LDLCALC 54 09/24/2019   LDLDIRECT 51 09/24/2019   TRIG 138 09/24/2019   CHOLHDL 2.3 09/24/2019    Lab Results  Component Value Date   HGBA1C 6.0 (H) 08/15/2019   Lab Results  Component Value Date   TSH 1.114 02/25/2020     STUDIES/PROCEDURES reviewed today   Echo 02/2020 1. Left ventricular ejection fraction, by estimation, is 55 to 60%. The  left ventricle has normal function. Left ventricular endocardial border  not optimally defined to evaluate regional wall motion. There is mild left  ventricular hypertrophy. Left  ventricular diastolic parameters are consistent with Grade I diastolic  dysfunction (impaired relaxation).  2. Right ventricular systolic function is mildly reduced. The right  ventricular size is normal. Mildly increased right ventricular wall  thickness. There is normal pulmonary artery systolic pressure.  3. The mitral valve is normal in structure. Mild mitral valve  regurgitation. No evidence of mitral stenosis.  4. The aortic valve is tricuspid. There is mild calcification of the  aortic valve. There is mild thickening of the aortic valve. Aortic valve  regurgitation is not visualized. Mild to moderate aortic valve  sclerosis/calcification is present, without any  evidence of aortic stenosis.  5. The inferior vena cava is normal in size with <50% respiratory  variability, suggesting right atrial pressure of 8 mmHg.   Cardiac cath 02/2020 Conclusions: 1. Multivessel coronary artery disease, including 80% D2 stenosis, 60% prox/mid LCx disease, chronic total occlusion of OM1, 99% mid RCA stenosis, and 50% rPDA lesion. 2. Widely patent LCx and distal RCA/PDA stents. 3. Normal left ventricular filling pressure. 4. Successful PCI to mid RCA using Resolute Onyx 2.75 x 15 mm drug-eluting stent with 0% residual stenosis and TIMI-3 flow.  Recommendations: 1. Continue dual antiplatelet therapy with aspirin and ticagrelor x 12  months. 2. Aggressive secondary prevention. 3. Obtain echocardiogram. 4. Anticipate discharge  home tomorrow if no post-cath complications.  Nelva Bush, MD [facility]   Coronary Diagrams   Diagnostic Dominance: Right    Intervention       2D echo 08/15/2019: 1. Left ventricular ejection fraction, by estimation, is 55 to 60%. The  left ventricle has normal function. The left ventricle has no regional  wall motion abnormalities. There is mild left ventricular hypertrophy.  Left ventricular diastolic parameters  were normal.  2. Right ventricular systolic function is normal. The right ventricular  size is normal. There is normal pulmonary artery systolic pressure.  3. The mitral valve is normal in structure. Mild mitral valve  regurgitation. No evidence of mitral stenosis.  4. The aortic valve is abnormal. Aortic valve regurgitation is not  visualized. Mild to moderate aortic valve sclerosis/calcification is  present, without any evidence of aortic stenosis.  __________   LHC 08/16/2019: A drug-eluting stent was successfully placed using a STENT SYNERGY DES 2.25X20.  A stent was successfully placed. Conclusions: 1. 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 2. Moderately elevated left ventricular filling pressure. 3. Complex PCI to distal RCA, rPDA, and rPLA using Synergy 2.25 x 20 mm drug-eluting stent extending from the distal RCA into the rPDA, as well as angioplasty of the ostial rPL branch. Intervention was complicated by no-reflow with transient hypotension/bradycardia and subsequent development of atrial fibrillation with rapid ventricular response. Final angiogram shows 0% residual stenosis in the distal RCA/rPDA and 10% residual stenosis at ostial  rPL with TIMI-2 flow. Recommendations: 1. Transfer to ICU for aggressive medical therapy and post-PCI monitoring. 2. Continue tirofiban infusion for 18 hours. 3. Dual antiplatelet therapy with aspirin and ticagrelor for at least 12 months. 4. Titrate nitroglycerine infusion for relief of chest pain. 5. Continue amiodarone infusion for rate control and hopefully conversion to sinus rhythm. If patient remains in atrial fibrillation tomorrow, IV heparin should be started (will defer heparin at this time given tirofiban infusion). 6. Aggressive secondary prevention. 7. Anticipate relook catheterization on Monday with PCI to the LCx. I favor medical therapy for D2, given relatively small vessel size and need to stent back to the LAD, were intervention undertaken. __________  LHC 08/19/2019:  2nd Diag lesion is 80% stenosed.  1st Mrg lesion is 100% stenosed.  Prox Cx to Mid Cx lesion is 60% stenosed.  Dist Cx lesion is 80% stenosed.  Mid RCA lesion is 55% stenosed.  RPAV lesion is 10% stenosed.  Balloon angioplasty was performed.  Previously placed Dist RCA drug-eluting stents are widely patent with 0% stenosed side branch in RPDA.  Post intervention, there is a 0% residual stenosis.  A drug-eluting stent was successfully placed using a STENT RESOLUTE ONYX 2.75X18.  1. Widely patent RCA stent with normal flow distally. Unchanged left coronary artery disease. 2. Mildly elevated left ventricular end-diastolic pressure at 14 mmHg. 3. Successful direct stenting of the distal left circumflex. Recommendations: Continue dual antiplatelet therapy for 1 year. Aggressive treatment of risk factors. If the patient remains stable, she can potentially be discharged home in the afternoon.  Assessment & Plan   Coronary artery disease with stable angina S/p PCI 08/2019, 02/2020 History of NSTEMI --Current dull CP/pressure, started this AM.  S/p recent PCI to her mid RCA.  Cath and echo  reviewed in detail. Left arteriotomy site without any signs  of swelling, hematoma, or infection.  Given current chest pain at the time of her visit, stat HS Tn obtained and not consistent with ISR.  EKG as above and reviewed with DOD same day.  Case and plan discussed with DOD as well. Plan is to increase Imdur to 30mg  daily. Given dizziness with SL nitro this weekend, hold Losartan with increased Imdur to ensure room in BP. Restart Losartan at RTC if BP stable on recheck. Ranexa discussed today-we will defer for now and see if increased dose Imdur improves her sx. Continue Toprol and DAPT with ASA and ticagrelor for at least 12 months. No s/sx of bleeding.  Will check CBC.  Continue secondary prevention of risk factors, including statin therapy. We discussed cardiac rehab today, and she declined 2/2 commute, but she knows she can call the office if she changes her mind. Ongoing diet and lifestyle changes recommended.   Atrial fibrillation with RVR, s/p PCI --Denies any racing heart rate or palpitations.  She did report feeling her heart beat in her chest the other day, which was unusual for her.  H/o A. fib with RVR, which developed during complicated PCI and thought likely mediated by acute ischemia and administration of atropine and norepinephrine.  She was discharged on oral amiodarone, since discontinued due maintaining NSR.  She is still NSR today with PVCs on EKG.  Continue Toprol for rate control.  No recommendation for anticoagulation as noted in past and given her likely etiology of her atrial fibrillation was 2/2 acute ischemia during catheterization.  We discussed cardiac monitoring today with patient preference to defer for now.  Consider Zio at RTC if ongoing awareness of heart/ectopy on EKG.  Will check labs to ensure electrolytes at goal.  Chronic HFpEF --Reports dyspnea/shortness of breath with episodes of chest pain.  Most recent echo as above with EF normal, mild LVH, mildly reduced RVSP,  euvolemic and well compensated on exam.  Weight up 2 pounds from 03/12/2020, though down from previous visits.  Will check a BNP to ensure this is not elevated. She is not currently on a diuretic, and we will continue to reassess her volume status at each visit.  She continues on a low-salt diet and total fluid under 2 L as recommended.  Continue current Toprol. Holding losartan with increased Imdur and plan to restart at RTC if Imdur well tolerated / BP allows. Continue diet and exercise as discussed.  HLD --LDL 54 and at goal of below 70 per most recent labs.  Continue high intensity atorvastatin 80 mg daily for target LDL less than 70.    Hypertension -BP well controlled.  No medication changes.  Continue current Toprol. Holding losartan to allow room in BP for increased Imdur at this time.  Restarted RTC if room in BP.  Continue to monitor BP at home with goal BP 130/80 or lower.  Mitral regurgitation --Mild to moderate by initial echo.  Continue to monitor with periodic echo as indicated.  Elevated A1C -Previous 08/2019 A1c elevated at 6.0.  Recommend glycemic control and management per PCP.  Will restart losartan at RTC if room in BP after escalated Imdur, given comorbid HTN.  Consider addition of Farxiga at RTC.  Medication changes: Increase Imdur to 30mg  daily. Hold losartan for now to ensure room in BP and reassess at clinic.  Will restart losartan at RTC if BP allows.  Labs ordered: Stat high-sensitivity troponin, CBC, BMET, BNP. Studies / Imaging ordered: None.  Future considerations: 2-week Zio XT,  deferred for now. Disposition: RTC 2 weeks  Arvil Chaco, PA-C 03/16/2020

## 2020-03-16 ENCOUNTER — Telehealth: Payer: Self-pay | Admitting: Physician Assistant

## 2020-03-16 ENCOUNTER — Other Ambulatory Visit
Admission: RE | Admit: 2020-03-16 | Discharge: 2020-03-16 | Disposition: A | Discharge status: Home / Self Care | Payer: Medicare Other | Source: Ambulatory Visit | Attending: Physician Assistant | Admitting: Physician Assistant

## 2020-03-16 ENCOUNTER — Other Ambulatory Visit: Payer: Self-pay

## 2020-03-16 ENCOUNTER — Encounter: Payer: Self-pay | Admitting: Physician Assistant

## 2020-03-16 ENCOUNTER — Ambulatory Visit (INDEPENDENT_AMBULATORY_CARE_PROVIDER_SITE_OTHER): Payer: Medicare Other | Admitting: Physician Assistant

## 2020-03-16 VITALS — BP 128/52 | HR 86 | Ht 62.0 in | Wt 136.0 lb

## 2020-03-16 DIAGNOSIS — I340 Nonrheumatic mitral (valve) insufficiency: Secondary | ICD-10-CM

## 2020-03-16 DIAGNOSIS — I214 Non-ST elevation (NSTEMI) myocardial infarction: Secondary | ICD-10-CM

## 2020-03-16 DIAGNOSIS — I25118 Atherosclerotic heart disease of native coronary artery with other forms of angina pectoris: Secondary | ICD-10-CM

## 2020-03-16 DIAGNOSIS — I10 Essential (primary) hypertension: Secondary | ICD-10-CM

## 2020-03-16 DIAGNOSIS — I4891 Unspecified atrial fibrillation: Secondary | ICD-10-CM | POA: Insufficient documentation

## 2020-03-16 DIAGNOSIS — Z8679 Personal history of other diseases of the circulatory system: Secondary | ICD-10-CM

## 2020-03-16 DIAGNOSIS — E785 Hyperlipidemia, unspecified: Secondary | ICD-10-CM

## 2020-03-16 DIAGNOSIS — R071 Chest pain on breathing: Secondary | ICD-10-CM

## 2020-03-16 DIAGNOSIS — I5032 Chronic diastolic (congestive) heart failure: Secondary | ICD-10-CM | POA: Diagnosis present

## 2020-03-16 DIAGNOSIS — I252 Old myocardial infarction: Secondary | ICD-10-CM

## 2020-03-16 LAB — CBC
HCT: 41 % (ref 36.0–46.0)
Hemoglobin: 13.6 g/dL (ref 12.0–15.0)
MCH: 32.9 pg (ref 26.0–34.0)
MCHC: 33.2 g/dL (ref 30.0–36.0)
MCV: 99 fL (ref 80.0–100.0)
Platelets: 285 10*3/uL (ref 150–400)
RBC: 4.14 MIL/uL (ref 3.87–5.11)
RDW: 13.6 % (ref 11.5–15.5)
WBC: 12.6 10*3/uL — ABNORMAL HIGH (ref 4.0–10.5)
nRBC: 0 % (ref 0.0–0.2)

## 2020-03-16 LAB — BASIC METABOLIC PANEL
Anion gap: 11 (ref 5–15)
BUN: 7 mg/dL — ABNORMAL LOW (ref 8–23)
CO2: 23 mmol/L (ref 22–32)
Calcium: 9.4 mg/dL (ref 8.9–10.3)
Chloride: 104 mmol/L (ref 98–111)
Creatinine, Ser: 0.7 mg/dL (ref 0.44–1.00)
GFR, Estimated: >60 mL/min (ref >60)
Glucose, Bld: 106 mg/dL — ABNORMAL HIGH (ref 70–99)
Potassium: 3.9 mmol/L (ref 3.5–5.1)
Sodium: 138 mmol/L (ref 135–145)

## 2020-03-16 LAB — TROPONIN I (HIGH SENSITIVITY): Troponin I (High Sensitivity): 10 ng/L (ref <18)

## 2020-03-16 LAB — BRAIN NATRIURETIC PEPTIDE: B Natriuretic Peptide: 98.7 pg/mL (ref 0.0–100.0)

## 2020-03-16 MED ORDER — ISOSORBIDE MONONITRATE ER 30 MG PO TB24: 30.0000 mg | ORAL_TABLET | Freq: Every day | ORAL | 3 refills | Status: DC

## 2020-03-16 NOTE — Telephone Encounter (Signed)
Phone call to pt to notify her of lab results, which were reassuring.

## 2020-03-16 NOTE — Patient Instructions (Addendum)
Medication Instructions:   Your physician has recommended you make the following change in your medication:   STOP taking Losartan   INCREASE Isosorbide (Imdur) to 30mg  DAILY  *If you need a refill on your cardiac medications before your next appointment, please call your pharmacy*   Lab Work: Your physician recommends that you have lab work TODAY at [facility]: Troponin, CBC, Bmet, BNP -  Please go to the [facility]. You will check in at the front desk to the right as you walk into the atrium.   If you have labs (blood work) drawn today and your tests are completely normal, you will receive your results only by: Marland Kitchen MyChart Message (if you have MyChart) OR . A paper copy in the mail If you have any lab test that is abnormal or we need to change your treatment, we will call you to review the results.   Testing/Procedures: None ordered   Follow-Up: At [facility], you and your health needs are our priority.  As part of our continuing mission to provide you with exceptional heart care, we have created designated Provider Care Teams.  These Care Teams include your primary Cardiologist (physician) and Advanced Practice Providers (APPs -  Physician Assistants and Nurse Practitioners) who all work together to provide you with the care you need, when you need it.  We recommend signing up for the patient portal called "MyChart".  Sign up information is provided on this After Visit Summary.  MyChart is used to connect with patients for Virtual Visits (Telemedicine).  Patients are able to view lab/test results, encounter notes, upcoming appointments, etc.  Non-urgent messages can be sent to your provider as well.   To learn more about what you can do with MyChart, go to [email or web address].    Your next appointment:   2 week(s) (or 1 month if unable to come in in 2 weeks)  The format for your next appointment:   In Person  Provider:   You may see Marrianne Mood, PA

## 2020-04-03 ENCOUNTER — Ambulatory Visit: Payer: Medicare Other | Admitting: Physician Assistant

## 2020-04-07 ENCOUNTER — Encounter: Payer: Self-pay | Admitting: Family

## 2020-04-07 ENCOUNTER — Ambulatory Visit (INDEPENDENT_AMBULATORY_CARE_PROVIDER_SITE_OTHER): Payer: Medicare Other

## 2020-04-07 ENCOUNTER — Ambulatory Visit (INDEPENDENT_AMBULATORY_CARE_PROVIDER_SITE_OTHER): Payer: Medicare Other | Admitting: Family

## 2020-04-07 ENCOUNTER — Other Ambulatory Visit: Payer: Self-pay

## 2020-04-07 VITALS — BP 110/60 | HR 91 | Ht 62.0 in | Wt 136.0 lb

## 2020-04-07 DIAGNOSIS — I25118 Atherosclerotic heart disease of native coronary artery with other forms of angina pectoris: Secondary | ICD-10-CM

## 2020-04-07 DIAGNOSIS — I493 Ventricular premature depolarization: Secondary | ICD-10-CM

## 2020-04-07 DIAGNOSIS — E785 Hyperlipidemia, unspecified: Secondary | ICD-10-CM

## 2020-04-07 DIAGNOSIS — I5032 Chronic diastolic (congestive) heart failure: Secondary | ICD-10-CM

## 2020-04-07 DIAGNOSIS — I340 Nonrheumatic mitral (valve) insufficiency: Secondary | ICD-10-CM

## 2020-04-07 DIAGNOSIS — Z8679 Personal history of other diseases of the circulatory system: Secondary | ICD-10-CM | POA: Diagnosis not present

## 2020-04-07 DIAGNOSIS — I10 Essential (primary) hypertension: Secondary | ICD-10-CM

## 2020-04-07 MED ORDER — TICAGRELOR 90 MG PO TABS: 90.0000 mg | ORAL_TABLET | Freq: Two times a day (BID) | ORAL | 2 refills | Status: DC

## 2020-04-07 NOTE — Progress Notes (Signed)
Office Visit    Patient Name: Cheryl Briggs Date of Encounter: 04/07/2020  PCP:  Joyice Faster, [facility]  Cardiologist:  Kathlyn Sacramento, MD  Advanced Practice Provider:  No care team member to display Electrophysiologist:  None   Chief Complaint    Cheryl Briggs is a 75 y.o. female with a hx of CAD, atrial fibrillation, HFpEF, hypertension, mitral regurgitation, hyperlipidemia, CLL, arthritis presents today for follow up after increased dose of Imdur.   Past Medical History    Past Medical History:  Diagnosis Date  . Allergy   . Arthritis   . Cancer ([place])    CLL   Past Surgical History:  Procedure Laterality Date  . BACK SURGERY    . BLADDER REPAIR    . CARDIAC CATHETERIZATION    . carpel tunnell Bilateral   . CHOLECYSTECTOMY    . COLONOSCOPY  2015  . CORONARY STENT INTERVENTION N/A 08/16/2019   Procedure: CORONARY STENT INTERVENTION;  Surgeon: Nelva Bush, MD;  Location: [facility];  Service: Cardiovascular;  Laterality: N/A;  . CORONARY STENT INTERVENTION Left 08/19/2019   Procedure: CORONARY STENT INTERVENTION;  Surgeon: Wellington Hampshire, MD;  Location: [facility];  Service: Cardiovascular;  Laterality: Left;  . CORONARY STENT INTERVENTION N/A 02/25/2020   Procedure: CORONARY STENT INTERVENTION;  Surgeon: Nelva Bush, MD;  Location: [facility];  Service: Cardiovascular;  Laterality: N/A;  . LEFT HEART CATH AND CORONARY ANGIOGRAPHY N/A 08/16/2019   Procedure: LEFT HEART CATH AND CORONARY ANGIOGRAPHY;  Surgeon: Nelva Bush, MD;  Location: [facility];  Service: Cardiovascular;  Laterality: N/A;  . LEFT HEART CATH AND CORONARY ANGIOGRAPHY N/A 02/25/2020   Procedure: LEFT HEART CATH AND CORONARY ANGIOGRAPHY;  Surgeon: Nelva Bush, MD;  Location: [facility];  Service: Cardiovascular;  Laterality: N/A;  . SHOULDER SURGERY Bilateral   . UPPER EXTREMITY ANGIOGRAPHY  Right 08/21/2019   Procedure: UPPER EXTREMITY ANGIOGRAPH;  Surgeon: Algernon Huxley, MD;  Location: [facility];  Service: Cardiovascular;  Laterality: Right;    Allergies  Allergies  Allergen Reactions  . Corticosteroids     Other reaction(s): Headache  . Other Other (See Comments)    Seafood  . Sulfa Antibiotics Anaphylaxis  . Clarithromycin     Other reaction(s): Other (See Comments) Rectal bleeding   . Cymbalta [Duloxetine Hcl] Diarrhea  . Prednisone Other (See Comments)    Color changes in face, headache and difficulty walking    History of Present Illness    Cheryl Briggs is a 75 y.o. female with a hx of CAD, atrial fibrillation, HFpEF, hypertension, mitral regurgitation, hyperlipidemia, CLL, arthritis.  She was last seen 03/16/2020 by J.Visser, PA.Marland Kitchen  CAD is s/p NS STEMI 08/2019 notable for severe three-vessel coronary disease and complicated/staged PCI (02/18/45 dRCA, 08/19/19 dLCx).  Her PCI 06/14/44 was complicated by no reflow, bradycardia, hypotension, atrial fibrillation with RVR and post procedure requiring hematoma.  She had another NSTEMI 02/25/2020 with PCI to [facility].  She was recommended for Brilinta and aspirin for 12 months.  Echocardiogram showed LVEF 55 to 50%, grade 1 diastolic dysfunction, mildly reduced RVSF.  Follow-up 03/12/2020 she noted occasional chest pain though had not taken her PRN nitroglycerin.  Stat high-sensitivity troponin was unremarkable, BNP 98.7.  As such, her Imdur was increased to 30 mg daily.  Losartan was held to prevent hypotension.  Presents today for follow up with her daughter. On home log  BP routinely <130/80. No lightheadedness, dizziness. HR at home routinely 70s-80s. Reports no shortness of breath nor dyspnea on exertion. She has been exercising at the senior center where her daughter works. Tells me she feels overall improved since Imdur was increased to 30mg  daily. She did have an episode of chest discomfort yesterday after exercising  for which she took 2 nitroglycerin with symptom relief. Has not recurred. We discussed increasing her dose of Imdur which she was hesitant about. Reports compliance with Brilinta/Aspirin and denies bleeding complications. We reviewed her EKG showing PVC's - she reports no palpitations.   EKGs/Labs/Other Studies Reviewed:   The following studies were reviewed today:   Echo 02/2020  1. Left ventricular ejection fraction, by estimation, is 55 to 60%. The  left ventricle has normal function. Left ventricular endocardial border  not optimally defined to evaluate regional wall motion. There is mild left  ventricular hypertrophy. Left  ventricular diastolic parameters are consistent with Grade I diastolic  dysfunction (impaired relaxation).   2. Right ventricular systolic function is mildly reduced. The right  ventricular size is normal. Mildly increased right ventricular wall  thickness. There is normal pulmonary artery systolic pressure.   3. The mitral valve is normal in structure. Mild mitral valve  regurgitation. No evidence of mitral stenosis.   4. The aortic valve is tricuspid. There is mild calcification of the  aortic valve. There is mild thickening of the aortic valve. Aortic valve  regurgitation is not visualized. Mild to moderate aortic valve  sclerosis/calcification is present, without any  evidence of aortic stenosis.   5. The inferior vena cava is normal in size with <50% respiratory  variability, suggesting right atrial pressure of 8 mmHg.   ________________________________________  Cardiac cath 02/2020 Conclusions: 1. Multivessel coronary artery disease, including 80% D2 stenosis, 60% prox/mid LCx disease, chronic total occlusion of OM1, 99% mid RCA stenosis, and 50% rPDA lesion. 2. Widely patent LCx and distal RCA/PDA stents. 3. Normal left ventricular filling pressure. 4. Successful PCI to mid RCA using Resolute Onyx 2.75 x 15 mm drug-eluting stent with 0% residual stenosis  and TIMI-3 flow.   Recommendations: 1. Continue dual antiplatelet therapy with aspirin and ticagrelor x 12 months. 2. Aggressive secondary prevention. 3. Obtain echocardiogram. 4. Anticipate discharge home tomorrow if no post-cath complications.   Nelva Bush, MD [facility]  Diagnostic Dominance: Right      Intervention          ________________________________________  2D echo 08/15/2019: 1. Left ventricular ejection fraction, by estimation, is 55 to 60%. The  left ventricle has normal function. The left ventricle has no regional  wall motion abnormalities. There is mild left ventricular hypertrophy.  Left ventricular diastolic parameters  were normal.   2. Right ventricular systolic function is normal. The right ventricular  size is normal. There is normal pulmonary artery systolic pressure.   3. The mitral valve is normal in structure. Mild mitral valve  regurgitation. No evidence of mitral stenosis.   4. The aortic valve is abnormal. Aortic valve regurgitation is not  visualized. Mild to moderate aortic valve sclerosis/calcification is  present, without any evidence of aortic stenosis.  ________________________________________   LHC 08/16/2019: A drug-eluting stent was successfully placed using a STENT SYNERGY DES 2.25X20.  A stent was successfully placed. Conclusions: 5. Severe multivessel coronary artery disease, including 80% D2 lesion, sequential 60% proximal and 80% distal LCx stenoses, chronic total occlusion of OM1, 50-60% mid RCA stenosis, and thrombotic 99% distal RCA lesion extending  into the rPDA. 6. Moderately elevated left ventricular filling pressure. 7. Complex PCI to distal RCA, rPDA, and rPLA using Synergy 2.25 x 20 mm drug-eluting stent extending from the distal RCA into the rPDA, as well as angioplasty of the ostial rPL branch.  Intervention was complicated by no-reflow with transient hypotension/bradycardia and subsequent development of atrial  fibrillation with rapid ventricular response.  Final angiogram shows 0% residual stenosis in the distal RCA/rPDA and 10% residual stenosis at ostial rPL with TIMI-2 flow. Recommendations: 1. Transfer to ICU for aggressive medical therapy and post-PCI monitoring. 2. Continue tirofiban infusion for 18 hours. 3. Dual antiplatelet therapy with aspirin and ticagrelor for at least 12 months. 4. Titrate nitroglycerine infusion for relief of chest pain. 5. Continue amiodarone infusion for rate control and hopefully conversion to sinus rhythm.  If patient remains in atrial fibrillation tomorrow, IV heparin should be started (will defer heparin at this time given tirofiban infusion). 6. Aggressive secondary prevention. 7. Anticipate relook catheterization on Monday with PCI to the LCx.  I favor medical therapy for D2, given relatively small vessel size and need to stent back to the LAD, were intervention undertaken. ________________________________________  LHC 08/19/2019:  2nd Diag lesion is 80% stenosed.  1st Mrg lesion is 100% stenosed.  Prox Cx to Mid Cx lesion is 60% stenosed.  Dist Cx lesion is 80% stenosed.  Mid RCA lesion is 55% stenosed.  RPAV lesion is 10% stenosed.  Balloon angioplasty was performed.  Previously placed Dist RCA drug-eluting stents are widely patent with 0% stenosed side branch in RPDA.  Post intervention, there is a 0% residual stenosis.  A drug-eluting stent was successfully placed using a STENT RESOLUTE ONYX 2.75X18.   1.  Widely patent RCA stent with normal flow distally.  Unchanged left coronary artery disease. 2.  Mildly elevated left ventricular end-diastolic pressure at 14 mmHg. 3.  Successful direct stenting of the distal left circumflex. Recommendations: Continue dual antiplatelet therapy for 1 year. Aggressive treatment of risk factors. If the patient remains stable, she can potentially be discharged home in the afternoon  EKG:  EKG is ordered today.   The ekg ordered today demonstrates NSR 91 bpm with occasional PVC and stable TWI in lead III. No acute ST/T wave changes.   Recent Labs: 08/17/2019: Magnesium 2.1 02/25/2020: TSH 1.114 03/16/2020: B Natriuretic Peptide 98.7; BUN 7; Creatinine, Ser 0.70; Hemoglobin 13.6; Platelets 285; Potassium 3.9; Sodium 138  Recent Lipid Panel    Component Value Date/Time   CHOL 139 09/24/2019 1152   TRIG 138 09/24/2019 1152   HDL 61 09/24/2019 1152   CHOLHDL 2.3 09/24/2019 1152   CHOLHDL 2.7 08/15/2019 1024   VLDL 33 08/15/2019 1024   LDLCALC 54 09/24/2019 1152   LDLDIRECT 51 09/24/2019 1152    Home Medications   Current Meds  Medication Sig  . aspirin EC 81 MG EC tablet Take 1 tablet (81 mg total) by mouth daily. Swallow whole.  Marland Kitchen atorvastatin (LIPITOR) 80 MG tablet Take 1 tablet (80 mg total) by mouth daily.  Marland Kitchen Bioflavonoid Products (ESTER C PO) Take 1,000 mg by mouth daily.  . Cholecalciferol 25 MCG (1000 UT) tablet Take 3,000 Units by mouth daily.  Marland Kitchen desonide (DESOWEN) 0.05 % ointment desonide 0.05 % topical ointment  . isosorbide mononitrate (IMDUR) 30 MG 24 hr tablet Take 1 tablet (30 mg total) by mouth daily.  . metoprolol succinate (TOPROL-XL) 50 MG 24 hr tablet Take 1.5 tablets (75 mg total) by mouth daily.  Marland Kitchen  Multiple Vitamins-Minerals (MULTIVITAMIN WITH MINERALS) tablet Take 1 tablet by mouth daily.  . nitroGLYCERIN (NITROSTAT) 0.4 MG SL tablet Place 1 tablet (0.4 mg total) under the tongue every 5 (five) minutes as needed for chest pain. Maximum of 3 doses.  . Omeprazole (PRILOSEC PO) Take 1 tablet by mouth daily.   . traMADol-acetaminophen (ULTRACET) 37.5-325 MG tablet Take 1 tablet by mouth 2 (two) times daily.     Review of Systems  All other systems reviewed and are otherwise negative except as noted above.  Physical Exam    VS:  BP 110/60 (BP Location: Left Arm, Patient Position: Sitting, Cuff Size: Normal)   Pulse 91   Ht 5\' 2"  (1.575 m)   Wt 136 lb (61.7 kg)   SpO2 98%    BMI 24.87 kg/m  , BMI Body mass index is 24.87 kg/m.  Wt Readings from Last 3 Encounters:  04/07/20 136 lb (61.7 kg)  03/16/20 136 lb (61.7 kg)  03/12/20 134 lb (60.8 kg)    GEN: Well nourished, well developed, in no acute distress. HEENT: normal. Neck: Supple, no JVD, carotid bruits, or masses. Cardiac: RRR, no murmurs, rubs, or gallops. No clubbing, cyanosis, edema.  Radials/PT 2+ and equal bilaterally.  Respiratory:  Respirations regular and unlabored, clear to auscultation bilaterally. GI: Soft, nontender, nondistended. MS: No deformity or atrophy. Skin: Warm and dry, no rash. Neuro:  Strength and sensation are intact. Psych: Normal affect.  Assessment & Plan     1. CAD-08/2019 NSTEMI  DES to [place], dLCx. 02/2020 NSTEMI with DES to [facility]. Recommended for DAPT Brilinta/Aspirin for 12 months. Denies bleeding complications, Brilinta sample provided in clinic. Reports improvement in chest pain since Imdur increased from 15mg  to 30mg . Isolated episode of chest discomfort yesterday which did require 2 nitroglycerin. We discussed increasing Imdur to 60mg  daily but she politely declines. She will contact our office if she is requiring more nitroglycerin. If chest discomfort persists despite escalation of Imdur dose may require ischemic evaluation via stress testing or repeat catheterization. Low suspicion for ISR of recently placed stent as troponin 03/16/20 unremarkable, EKG stable, and compliant with Brilinta/Aspirin. She has declined cardiac rehab but is working out at the senior center. Heart healthy diet encouraged.   2. Atrial fibrillation- In the setting of complicated PCI 04/5407 and likely mediated by acute ischemia and administration of atropine and norepinephrine. No evidence of recurrence. As it was isolated episode, no indication for anticoagulation. If atrial fib recurred, she would require anticoagulation.   3. PVC - No palpitation though PVC noted on EKG today. 14 day ZIO monitor  placed to determine burden and rule out other arrhythmia such as atrial fibrillation. Lab work 03/16/20 with normal renal function, Hb/Hct, and electrolytes. 02/25/20 TSH 1.114.   4. Chronic HFpEF-Echo 2/22 LVEF 55 to 81%, grade 1 diastolic dysfunction. Euvolemic and well compensated on exam. No indication for loop diuretic at this time. Low salt diet and regular cardiovascular exercise encouraged. GDMT includes beta blocker.   5. Hyperlipidemia- Continue Atorvastatin 80mg  daily.   6. Hypertension- BP well controlled. Continue current antihypertensive regimen including Imdur 30mg  daily, Toprol 75 mg daily. As her BP is routinely <130/80 at home we will defer re-initiation of Losartan to prevent hypotension.   7. Mitral regurgitation- Mild by previous echo. Continue to monitor with periodic echo as clinically indicated.    Disposition: Follow up in 6 week(s) with Dr. Fletcher Anon or APP   Signed, Loel Dubonnet, NP 04/07/2020, 10:55 AM [facility]

## 2020-04-07 NOTE — Patient Instructions (Addendum)
Medication Instructions:  Continue your current medications   If you notice increased episodes of chest pain, we will consider increasing your dose of Imdur.   *If you need a refill on your cardiac medications before your next appointment, please call your pharmacy*  Lab Work: None ordered today.   Testing/Procedures:  Your physician has recommended that you wear a Zio XT  Monitor for 14 days. This monitor is a medical device that records the heart's electrical activity. Doctors most often use these monitors to diagnose arrhythmias. Arrhythmias are problems with the speed or rhythm of the heartbeat. The monitor is a small device applied to your chest. You can wear one while you do your normal daily activities. While wearing this monitor if you have any symptoms to push the button and record what you felt. Once you have worn this monitor for the period of time provider prescribed (Usually 14 days), you will return the monitor device in the postage paid box. Once it is returned they will download the data collected and provide [place] with a report which the provider will then review and we will call you with those results. Important tips:  1. Avoid showering during the first 24 hours of wearing the monitor. 2. Avoid excessive sweating to help maximize wear time. 3. Do not submerge the device, no hot tubs, and no swimming pools. 4. Keep any lotions or oils away from the patch. 5. After 24 hours you may shower with the patch on. Take brief showers with your back facing the shower head.  6. Do not remove patch once it has been placed because that will interrupt data and decrease adhesive wear time. 7. Push the button when you have any symptoms and write down what you were feeling. 8. Once you have completed wearing your monitor, remove and place into box which has postage paid and place in your outgoing mailbox.  9. If for some reason you have misplaced your box then call our office and we can provide  another box and/or mail it off for you.       Follow-Up: At [facility], you and your health needs are our priority.  As part of our continuing mission to provide you with exceptional heart care, we have created designated Provider Care Teams.  These Care Teams include your primary Cardiologist (physician) and Advanced Practice Providers (APPs -  Physician Assistants and Nurse Practitioners) who all work together to provide you with the care you need, when you need it.  We recommend signing up for the patient portal called "MyChart".  Sign up information is provided on this After Visit Summary.  MyChart is used to connect with patients for Virtual Visits (Telemedicine).  Patients are able to view lab/test results, encounter notes, upcoming appointments, etc.  Non-urgent messages can be sent to your provider as well.   To learn more about what you can do with MyChart, go to [email or web address].    Your next appointment:   6-8 weeks  The format for your next appointment:   In Person  Provider:   You may see Kathlyn Sacramento, MD or one of the following Advanced Practice Providers on your designated Care Team:    Murray Hodgkins, NP  Christell Faith, PA-C  Marrianne Mood, PA-C  Cadence Kathlen Mody, [place]  Laurann Montana, NP  Other Instructions  Heart Healthy Diet Recommendations: A low-salt diet is recommended. Meats should be grilled, baked, or boiled. Avoid fried foods. Focus on lean protein sources like fish or chicken  with vegetables and fruits. The American Heart Association is a Microbiologist!  American Heart Association Diet and Lifeystyle Recommendations   Exercise recommendations: The American Heart Association recommends 150 minutes of moderate intensity exercise weekly. Try 30 minutes of moderate intensity exercise 4-5 times per week. This could include walking, jogging, or swimming.

## 2020-04-08 ENCOUNTER — Telehealth: Payer: Self-pay | Admitting: Hematology and Oncology

## 2020-04-08 NOTE — Telephone Encounter (Signed)
04/08/2020 Spoke w/ pt and informed her that appts on 4/18 have been r/s to 5/2 due to the clinic being closed, per [place]. Pt confirmed new appts  SRW

## 2020-04-10 ENCOUNTER — Telehealth: Payer: Self-pay | Admitting: Cardiovascular Disease

## 2020-04-10 MED ORDER — ISOSORBIDE MONONITRATE ER 60 MG PO TB24: 60.0000 mg | ORAL_TABLET | Freq: Every day | ORAL | 3 refills | Status: DC

## 2020-04-10 NOTE — Telephone Encounter (Signed)
Patient calling  States C Gilford Rile mentioned at last visit if she is still having problems they can increase Imdur  Patient would like to increase Please call to discuss

## 2020-04-10 NOTE — Telephone Encounter (Signed)
Pt seen in clinic 3/29 by Urban Gibson, h/o CAD.  Per 3/29 note: We discussed increasing Imdur to 60mg  daily but she politely declines. She will contact our office if she is requiring more nitroglycerin.  Returned pt call. She reports this AM she did take NTG x 1 after exercise, felt pressure in chest. Chest pressure resolved after approx 7-9 minutes.  Chest discomfort restarted shortly after, pt rested in recliner and discomfort resolved.   Since ov she has monitored BP/HR: 3/30 120/72 90 3/31 122/71 80 4/1 - this morning BP/HR taken after AM meds and after NTG - 104/69 72 Pt states this is lower than her usual BP readings.   Pt does want to make [facility] aware that she would agree to increasing Imdur if she still recommends at this time.

## 2020-04-10 NOTE — Telephone Encounter (Signed)
Recommend she increase Imdur to 60mg  daily. She may use up her 30mg  tablets by taking two together once daily. Please send Rx for 60mg  tablet.   Nitroglycerin can lower blood pressure so it does not surprise me it was low today. Recommend she increase her fluid intake today as this will help elevate her blood pressure. She should wait to start the increased dose of Imdur until tomorrow to prevent symptomatic hypotension.   Imdur can lower the BP. If she notices lightheadedness or dizziness please have her contact our office.  Loel Dubonnet, NP

## 2020-04-10 NOTE — Telephone Encounter (Signed)
Spoke to pt, notified of provider's recc detailed below.  Pt verbalized understanding. She will: Incr Imdur to 60mg  daily.  Incr fluid intake today to help elevate BP.  Wait to start incr dose Imdur until tomorrow.  Call the office with any new s/s of light headedness or dizziness after incr dose.   Pt will double current dose of 30mg  Imdur until picks up new Rx.  New Rx sent to [facility] per pt request of Imdur 60mg  daily.   Pt appreciative of help and has no further questions at this time.

## 2020-04-14 ENCOUNTER — Other Ambulatory Visit: Payer: Self-pay | Admitting: Cardiovascular Disease

## 2020-04-14 NOTE — Telephone Encounter (Signed)
Please advise if ok to refill Atorvastatin 80 mg tablet Last Filled by Loletha Grayer, MD [facility]

## 2020-04-27 ENCOUNTER — Ambulatory Visit: Payer: Medicare Other

## 2020-04-27 ENCOUNTER — Other Ambulatory Visit: Payer: Medicare Other

## 2020-04-29 ENCOUNTER — Telehealth: Payer: Self-pay | Admitting: *Deleted

## 2020-04-29 NOTE — Telephone Encounter (Signed)
-----   Message from Loel Dubonnet, NP sent at 04/29/2020  4:17 PM EDT ----- Monitor showed predominantly normal sinus rhythm with average heart rate of 76 bpm. She had one episode of fast heart rate from the bottom chamber of the heart that only lasted 5 beats and is not of concern. She had 8 episodes of fast heart beat in the top chamber of the heart which were asymptomatic. No atrial fibrillation, flutter, nor pauses. She had very infrequent early beats which happened less than 1% of the time. Overall good result. Continue current dose of Metoprolol and follow up as scheduled.

## 2020-04-29 NOTE — Telephone Encounter (Signed)
Attempted to call pt to review results. No answer. Lmtcb.  

## 2020-05-04 MED ORDER — METOPROLOL SUCCINATE ER 50 MG PO TB24: 75.0000 mg | ORAL_TABLET | Freq: Every day | ORAL | 3 refills | Status: DC

## 2020-05-04 NOTE — Telephone Encounter (Signed)
Spoke to pt. Notified of zio monitor results. Pt voiced understanding. She requests refill of Metoprolol Succinate 75mg  daily.  Rx sent to [facility] per pt request.  Pt will follow up as scheduled 06/05/20.

## 2020-05-04 NOTE — Telephone Encounter (Signed)
Patient returning call to go over results.

## 2020-05-06 ENCOUNTER — Other Ambulatory Visit: Payer: Self-pay

## 2020-05-06 DIAGNOSIS — C9110 Chronic lymphocytic leukemia of B-cell type not having achieved remission: Secondary | ICD-10-CM

## 2020-05-11 ENCOUNTER — Inpatient Hospital Stay (HOSPITAL_BASED_OUTPATIENT_CLINIC_OR_DEPARTMENT_OTHER): Payer: Medicare Other | Admitting: Oncology

## 2020-05-11 ENCOUNTER — Inpatient Hospital Stay: Payer: Medicare Other | Attending: Oncology

## 2020-05-11 ENCOUNTER — Encounter: Payer: Self-pay | Admitting: Oncology

## 2020-05-11 ENCOUNTER — Other Ambulatory Visit: Payer: Self-pay

## 2020-05-11 VITALS — BP 134/73 | HR 91 | Temp 97.4°F | Resp 16 | Wt 136.2 lb

## 2020-05-11 DIAGNOSIS — M1990 Unspecified osteoarthritis, unspecified site: Secondary | ICD-10-CM | POA: Insufficient documentation

## 2020-05-11 DIAGNOSIS — M85852 Other specified disorders of bone density and structure, left thigh: Secondary | ICD-10-CM | POA: Insufficient documentation

## 2020-05-11 DIAGNOSIS — C9110 Chronic lymphocytic leukemia of B-cell type not having achieved remission: Secondary | ICD-10-CM

## 2020-05-11 DIAGNOSIS — I517 Cardiomegaly: Secondary | ICD-10-CM | POA: Diagnosis not present

## 2020-05-11 DIAGNOSIS — R5383 Other fatigue: Secondary | ICD-10-CM | POA: Insufficient documentation

## 2020-05-11 DIAGNOSIS — Z7982 Long term (current) use of aspirin: Secondary | ICD-10-CM | POA: Insufficient documentation

## 2020-05-11 DIAGNOSIS — I25119 Atherosclerotic heart disease of native coronary artery with unspecified angina pectoris: Secondary | ICD-10-CM

## 2020-05-11 DIAGNOSIS — Z79899 Other long term (current) drug therapy: Secondary | ICD-10-CM | POA: Insufficient documentation

## 2020-05-11 DIAGNOSIS — I252 Old myocardial infarction: Secondary | ICD-10-CM | POA: Insufficient documentation

## 2020-05-11 LAB — CBC WITH DIFFERENTIAL/PLATELET
Abs Immature Granulocytes: 0.04 10*3/uL (ref 0.00–0.07)
Basophils Absolute: 0.1 10*3/uL (ref 0.0–0.1)
Basophils Relative: 1 %
Eosinophils Absolute: 0.2 10*3/uL (ref 0.0–0.5)
Eosinophils Relative: 2 %
HCT: 43.5 % (ref 36.0–46.0)
Hemoglobin: 14.4 g/dL (ref 12.0–15.0)
Immature Granulocytes: 0 %
Lymphocytes Relative: 54 %
Lymphs Abs: 6.9 10*3/uL — ABNORMAL HIGH (ref 0.7–4.0)
MCH: 32.7 pg (ref 26.0–34.0)
MCHC: 33.1 g/dL (ref 30.0–36.0)
MCV: 98.9 fL (ref 80.0–100.0)
Monocytes Absolute: 0.5 10*3/uL (ref 0.1–1.0)
Monocytes Relative: 4 %
Neutro Abs: 4.9 10*3/uL (ref 1.7–7.7)
Neutrophils Relative %: 39 %
Platelets: 258 10*3/uL (ref 150–400)
RBC: 4.4 MIL/uL (ref 3.87–5.11)
RDW: 12.9 % (ref 11.5–15.5)
WBC: 12.5 10*3/uL — ABNORMAL HIGH (ref 4.0–10.5)
nRBC: 0 % (ref 0.0–0.2)

## 2020-05-11 NOTE — Progress Notes (Signed)
[facility]  [address], [address] [place], [place] [address] Phone: [phone]  Fax: [phone]   Clinic Day:  05/11/2020  Referring physician: Joyice Faster, FNP  Chief Complaint: Cheryl Briggs is a 75 y.o. 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  HPI: The patient was last seen in the medical oncology clinic on 04/29/2019. At that time, she denied any B symptoms.she felt "good" . She denied any infections. She denied any B symptoms. Complained of pain in her shoulders, hands, right hip and ankle secondary to osteoarthritis. New pain in wrist secondary to osteoarthritis.   She was admitted to the hospital on 02/25/2020 and discharged on 02/26/2020 for chest pain and found to have a non-STEMI.  She had a cardiac cath on 02/25/2020 with successful PCI to the mid RCA.  Echocardiogram showed EF of 55 to 60%.  Bilateral mammogram on 05/06/2019 was negative.  Bone density on 08/15/2018 showed osteopenia with a T-score of -2.0 in the left neck femur.   During the interim, she continues to recover from a recent heart attack.  She continues to be followed closely by cardiology and was last seen on 04/10/2020.  She continues to feel fatigued but feels she is improving daily.  She is able to exercise some throughout the week.  Recently wore a continuous heart monitor d/t recurrent episodes of chest pain and pressure which showed predominantly normal sinus rhythm with average heart rate of 76 bpm.  No atrial fibrillation, flutter nor pauses.  She is stable on metoprolol.   Past Medical History:  Diagnosis Date  . Allergy   . Arthritis   . Cancer ([place])    CLL    Past Surgical History:  Procedure Laterality Date  . BACK SURGERY    . BLADDER REPAIR    . CARDIAC CATHETERIZATION    . carpel tunnell Bilateral   . CHOLECYSTECTOMY    . COLONOSCOPY  2015  . CORONARY STENT INTERVENTION N/A 08/16/2019   Procedure: CORONARY STENT INTERVENTION;  Surgeon:  Nelva Bush, MD;  Location: [facility];  Service: Cardiovascular;  Laterality: N/A;  . CORONARY STENT INTERVENTION Left 08/19/2019   Procedure: CORONARY STENT INTERVENTION;  Surgeon: Wellington Hampshire, MD;  Location: [facility];  Service: Cardiovascular;  Laterality: Left;  . CORONARY STENT INTERVENTION N/A 02/25/2020   Procedure: CORONARY STENT INTERVENTION;  Surgeon: Nelva Bush, MD;  Location: [facility];  Service: Cardiovascular;  Laterality: N/A;  . LEFT HEART CATH AND CORONARY ANGIOGRAPHY N/A 08/16/2019   Procedure: LEFT HEART CATH AND CORONARY ANGIOGRAPHY;  Surgeon: Nelva Bush, MD;  Location: [facility];  Service: Cardiovascular;  Laterality: N/A;  . LEFT HEART CATH AND CORONARY ANGIOGRAPHY N/A 02/25/2020   Procedure: LEFT HEART CATH AND CORONARY ANGIOGRAPHY;  Surgeon: Nelva Bush, MD;  Location: [facility];  Service: Cardiovascular;  Laterality: N/A;  . SHOULDER SURGERY Bilateral   . UPPER EXTREMITY ANGIOGRAPHY Right 08/21/2019   Procedure: UPPER EXTREMITY ANGIOGRAPH;  Surgeon: Algernon Huxley, MD;  Location: [facility];  Service: Cardiovascular;  Laterality: Right;    Family History  Problem Relation Age of Onset  . Heart disease Mother   . Heart attack Father   . Breast cancer Neg Hx     Social History:  reports that she has never smoked. She has never used smokeless tobacco. She reports that she does not drink alcohol and does not use drugs. The  patient previously lived in [place], [place] for the past 40+ years.  She moved to [place] to be closer to her daughter. 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  Allergies:  Allergies  Allergen Reactions  . Corticosteroids     Other reaction(s): Headache  . Other Other (See Comments)    Seafood  . Sulfa Antibiotics Anaphylaxis  . Clarithromycin     Other reaction(s): Other (See Comments) Rectal bleeding   .  Cymbalta [Duloxetine Hcl] Diarrhea  . Prednisone Other (See Comments)    Color changes in face, headache and difficulty walking    Current Medications: Current Outpatient Medications  Medication Sig Dispense Refill  . aspirin EC 81 MG EC tablet Take 1 tablet (81 mg total) by mouth daily. Swallow whole.    Marland Kitchen atorvastatin (LIPITOR) 80 MG tablet TAKE 1 TABLET BY MOUTH ONCE DAILY. 90 tablet 3  . azelastine (ASTELIN) 0.1 % nasal spray Place into both nostrils.    Marland Kitchen Bioflavonoid Products (ESTER C PO) Take 1,000 mg by mouth daily.    . Cholecalciferol 25 MCG (1000 UT) tablet Take 3,000 Units by mouth daily.    Marland Kitchen desonide (DESOWEN) 0.05 % ointment desonide 0.05 % topical ointment    . isosorbide mononitrate (IMDUR) 60 MG 24 hr tablet Take 1 tablet (60 mg total) by mouth daily. 90 tablet 3  . metoprolol succinate (TOPROL-XL) 50 MG 24 hr tablet Take 1.5 tablets (75 mg total) by mouth daily. 135 tablet 3  . Multiple Vitamins-Minerals (MULTIVITAMIN WITH MINERALS) tablet Take 1 tablet by mouth daily.    . nitroGLYCERIN (NITROSTAT) 0.4 MG SL tablet Place 1 tablet (0.4 mg total) under the tongue every 5 (five) minutes as needed for chest pain. Maximum of 3 doses. 25 tablet 1  . Omeprazole (PRILOSEC PO) Take 1 tablet by mouth daily.     . ticagrelor (BRILINTA) 90 MG TABS tablet Take 1 tablet (90 mg total) by mouth 2 (two) times daily. 180 tablet 2  . traMADol-acetaminophen (ULTRACET) 37.5-325 MG tablet Take 1 tablet by mouth 2 (two) times daily.     No current facility-administered medications for this visit.    Review of Systems  Constitutional: Positive for malaise/fatigue. Negative for chills, fever and weight loss.  HENT: Negative for congestion, ear pain and tinnitus.   Eyes: Negative.  Negative for blurred vision and double vision.  Respiratory: Positive for shortness of breath. Negative for cough and sputum production.   Cardiovascular: Negative.  Negative for chest pain, palpitations and leg  swelling.  Gastrointestinal: Negative.  Negative for abdominal pain, constipation, diarrhea, nausea and vomiting.  Genitourinary: Negative for dysuria, frequency and urgency.  Musculoskeletal: Negative for back pain and falls.  Skin: Negative.  Negative for rash.  Neurological: Positive for weakness. Negative for headaches.  Endo/Heme/Allergies: Negative.  Does not bruise/bleed easily.  Psychiatric/Behavioral: Negative.  Negative for depression. The patient is not nervous/anxious and does not have insomnia.      Performance status (ECOG): 0  Vitals Blood pressure 134/73, pulse 91, temperature (!) 97.4 F (36.3 C), resp. rate 16, weight 136 lb 3.9 oz (61.8 kg), SpO2 97 %.   Physical Exam Constitutional:      Appearance: She is well-developed.  HENT:     Head: Normocephalic and atraumatic.  Eyes:     Pupils: Pupils are equal, round, and reactive to light.  Cardiovascular:     Rate and Rhythm: Normal rate and regular rhythm.  Heart sounds: No murmur heard.   Pulmonary:     Effort: Pulmonary effort is normal.     Breath sounds: Normal breath sounds. No wheezing.  Abdominal:     General: Bowel sounds are normal. There is no distension.     Palpations: Abdomen is soft. There is no mass.     Tenderness: There is no abdominal tenderness.  Musculoskeletal:        General: Normal range of motion.     Cervical back: Normal range of motion.  Skin:    General: Skin is warm and dry.  Neurological:     Mental Status: She is alert and oriented to person, place, and time.  Psychiatric:        Behavior: Behavior normal.     Appointment on 05/11/2020  Component Date Value Ref Range Status  . WBC 05/11/2020 12.5* 4.0 - 10.5 K/uL Final  . RBC 05/11/2020 4.40  3.87 - 5.11 MIL/uL Final  . Hemoglobin 05/11/2020 14.4  12.0 - 15.0 g/dL Final  . HCT 87/57/9728 43.5  36.0 - 46.0 % Final  . MCV 05/11/2020 98.9  80.0 - 100.0 fL Final  . MCH 05/11/2020 32.7  26.0 - 34.0 pg Final  . MCHC  05/11/2020 33.1  30.0 - 36.0 g/dL Final  . RDW 20/60/1561 12.9  11.5 - 15.5 % Final  . Platelets 05/11/2020 258  150 - 400 K/uL Final  . nRBC 05/11/2020 0.0  0.0 - 0.2 % Final  . Neutrophils Relative % 05/11/2020 39  % Final  . Neutro Abs 05/11/2020 4.9  1.7 - 7.7 K/uL Final  . Lymphocytes Relative 05/11/2020 54  % Final  . Lymphs Abs 05/11/2020 6.9* 0.7 - 4.0 K/uL Final  . Monocytes Relative 05/11/2020 4  % Final  . Monocytes Absolute 05/11/2020 0.5  0.1 - 1.0 K/uL Final  . Eosinophils Relative 05/11/2020 2  % Final  . Eosinophils Absolute 05/11/2020 0.2  0.0 - 0.5 K/uL Final  . Basophils Relative 05/11/2020 1  % Final  . Basophils Absolute 05/11/2020 0.1  0.0 - 0.1 K/uL Final  . Immature Granulocytes 05/11/2020 0  % Final  . Abs Immature Granulocytes 05/11/2020 0.04  0.00 - 0.07 K/uL Final   Performed at [facility], [address], [place], [place] [address]    Assessment:  Karsen Hartong is a 75 y.o. 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  Hematocrit and platelet count are normal.  WBC has ranged between 14,000 - 15,000.  Bilateral mammogram on 03/27/2018 revealed no malignancy.  Bone density on 08/15/2018 revealed osteopenia with a T-score of -2.0 in the left neck femur.  She had her last COVID-19 vaccine on 03/20/2019  Symptomatically, she feels good.  She denies any B symptoms.  Exam reveals no adenopathy or hepatosplenomegaly.  Plan: 1.   Labs today: CBC with diff. 2.   Chronic lymphocytic leukemia (CLL)  Labs from 05/11/2020 show hemoglobin of 14.4, platelets 258, WBC 12.5 ANC 4900             Symptomatically, she is doing fair.    She denies any fevers, sweats or weight loss.    Exam reveals no adenopathy or hepatosplenomegaly.    She has had no infections.    Counts remain good.             Consider checking IgG level (and IgA in the event of need for IVIG).  No indication for treatment  Continue surveillance 3.    Non-STEMI  Recently  admitted from 215/22-2/16/22 due to chest pain.  Showed culprit lesion 9% and mid RCA stenosis treated with PCI.   Was started on dual antiplatelet treatment with aspirin and Brilinta x12 months.  Echo showed LVEF 55 to 60%, mild LVH, grade 1 diastolic dysfunction, mildly reduced RV function.  Appears to be recovering well.  Followed by cardiology closely. 3.   Health maintenance  Mammogram due.  PCP normally schedules.   Disposition: Schedule mammogram-message PCP She will be due for a bone density scan in August 2022. RTC in 1 year for MD assessment and labs.    I discussed the assessment and treatment plan with the patient.  The patient was provided an opportunity to ask questions and all were answered.  The patient agreed with the plan and demonstrated an understanding of the instructions.  The patient was advised to call back if the symptoms worsen or if the condition fails to improve as anticipated.   Greater than 50% was spent in counseling and coordination of care with this patient including but not limited to discussion of the relevant topics above (See A&P) including, but not limited to diagnosis and management of acute and chronic medical conditions.   Faythe Casa, NP 05/11/2020 10:31 AM

## 2020-05-11 NOTE — Progress Notes (Signed)
Pt states she suffered a heart attack back in Sept and February.

## 2020-06-05 ENCOUNTER — Telehealth: Payer: Self-pay | Admitting: *Deleted

## 2020-06-05 ENCOUNTER — Other Ambulatory Visit: Payer: Self-pay

## 2020-06-05 ENCOUNTER — Ambulatory Visit (INDEPENDENT_AMBULATORY_CARE_PROVIDER_SITE_OTHER): Payer: Medicare Other | Admitting: Physician Assistant

## 2020-06-05 ENCOUNTER — Telehealth: Payer: Self-pay | Admitting: Physician Assistant

## 2020-06-05 ENCOUNTER — Encounter: Payer: Self-pay | Admitting: Physician Assistant

## 2020-06-05 VITALS — BP 120/70 | HR 74 | Ht 62.0 in | Wt 135.0 lb

## 2020-06-05 DIAGNOSIS — I493 Ventricular premature depolarization: Secondary | ICD-10-CM

## 2020-06-05 DIAGNOSIS — E785 Hyperlipidemia, unspecified: Secondary | ICD-10-CM

## 2020-06-05 DIAGNOSIS — I200 Unstable angina: Secondary | ICD-10-CM

## 2020-06-05 DIAGNOSIS — R011 Cardiac murmur, unspecified: Secondary | ICD-10-CM

## 2020-06-05 DIAGNOSIS — I25118 Atherosclerotic heart disease of native coronary artery with other forms of angina pectoris: Secondary | ICD-10-CM | POA: Diagnosis not present

## 2020-06-05 DIAGNOSIS — I10 Essential (primary) hypertension: Secondary | ICD-10-CM

## 2020-06-05 DIAGNOSIS — I340 Nonrheumatic mitral (valve) insufficiency: Secondary | ICD-10-CM | POA: Diagnosis not present

## 2020-06-05 DIAGNOSIS — I252 Old myocardial infarction: Secondary | ICD-10-CM

## 2020-06-05 DIAGNOSIS — I5032 Chronic diastolic (congestive) heart failure: Secondary | ICD-10-CM

## 2020-06-05 DIAGNOSIS — Z8679 Personal history of other diseases of the circulatory system: Secondary | ICD-10-CM

## 2020-06-05 DIAGNOSIS — C9110 Chronic lymphocytic leukemia of B-cell type not having achieved remission: Secondary | ICD-10-CM

## 2020-06-05 MED ORDER — TICAGRELOR 90 MG PO TABS: 90.0000 mg | ORAL_TABLET | Freq: Two times a day (BID) | ORAL | 2 refills | Status: DC

## 2020-06-05 MED ORDER — SODIUM CHLORIDE 0.9% FLUSH: 3.0000 mL | Freq: Two times a day (BID) | INTRAVENOUS | Status: DC

## 2020-06-05 MED ORDER — LOSARTAN POTASSIUM 25 MG PO TABS: 12.5000 mg | ORAL_TABLET | Freq: Every day | ORAL | Status: DC

## 2020-06-05 NOTE — Telephone Encounter (Signed)
Spoke to pt. Questions answered. No further questions regarding plan of care at this time.

## 2020-06-05 NOTE — Telephone Encounter (Signed)
Patient returning call.

## 2020-06-05 NOTE — Patient Instructions (Signed)
Medication Instructions:  Your physician recommends that you continue on your current medications as directed. Please refer to the Current Medication list given to you today.  *If you need a refill on your cardiac medications before your next appointment, please call your pharmacy*   Lab Work:  Your physician recommends that you have lab work TODAY: BMET, CBC   Testing/Procedures:  1) Your physician has requested that you have an echocardiogram AFTER cath procedure. Echocardiography is a painless test that uses sound waves to create images of your heart. It provides your doctor with information about the size and shape of your heart and how well your heart's chambers and valves are working. This procedure takes approximately one hour. There are no restrictions for this procedure.  2) Cardiac Catheterization:  You are scheduled for a Cardiac Catheterization on Thursday, June 9 with Dr. Kathlyn Sacramento.  1. Please arrive at the [facility] at ____________ (This time is one hour before your procedure to ensure your preparation). Free valet parking service is available.   Special note: Every effort is made to have your procedure done on time. Please understand that emergencies sometimes delay scheduled procedures.  2. Diet: Do not eat solid foods after midnight.  The patient may have clear liquids until 5am upon the day of the procedure.  3. Labs: Completed today at visit.  4. Medication instructions in preparation for your procedure:   Contrast Allergy: No   On the morning of your procedure, take your Aspirin and Brilinta/Ticagrelor and any morning medicines NOT listed above.  You may use sips of water.  5. Plan for one night stay--bring personal belongings. 6. Bring a current list of your medications and current insurance cards. 7. You MUST have a responsible person to drive you home. 8. Someone MUST be with you the first 24 hours after you arrive home or your discharge  will be delayed. 9. Please wear clothes that are easy to get on and off and wear slip-on shoes.  Thank you for allowing [place] to care for you!   -- [facility]    Follow-Up: At [facility], you and your health needs are our priority.  As part of our continuing mission to provide you with exceptional heart care, we have created designated Provider Care Teams.  These Care Teams include your primary Cardiologist (physician) and Advanced Practice Providers (APPs -  Physician Assistants and Nurse Practitioners) who all work together to provide you with the care you need, when you need it.  We recommend signing up for the patient portal called "MyChart".  Sign up information is provided on this After Visit Summary.  MyChart is used to connect with patients for Virtual Visits (Telemedicine).  Patients are able to view lab/test results, encounter notes, upcoming appointments, etc.  Non-urgent messages can be sent to your provider as well.   To learn more about what you can do with MyChart, go to [email or web address].    Your next appointment:    Follow up after cath/echo procedures  The format for your next appointment:   In Person  Provider:   You may see Kathlyn Sacramento, MD or one of the following Advanced Practice Providers on your designated Care Team:     Marrianne Mood, [place]

## 2020-06-05 NOTE — Progress Notes (Signed)
Office Visit   Patient Name: Cheryl Briggs Date of Encounter: 06/05/2020  PCP:  Joyice Faster, [place]  Cardiologist:  Kathlyn Sacramento, MD  Advanced Practice Provider:  Arvil Chaco, PA-C Electrophysiologist:  None   Chief Complaint    Chief Complaint  Patient presents with  . Follow-up    8 Weeks follow up and follow up zio results. Patient c/o feeling very fatigue lately. Medications verbally reviewed with patient.     75 y.o. female with a hx of CLL, arthritis, secondhand smoke exposure, non-STEMI with subsequent LHC 08/2019 and significant 3v CAD and complicated/staged PCI (0/9/38 dRCA, 08/19/19 dLCx), PCI on 8/6 complicated by no reflow and bradycardia/hypotension/atrial fibrillation with RVR, post procedure right forearm hematoma, 2/15/222 NSTEMI and PCI to [facility]. HFpEF, hypertension, mitral regurgitation, hyperlipidemia, and here for 1 week RTC after ongoing CP s/p NSTEMI and PCI to [facility] 02/25/20.   Past Medical History    Past Medical History:  Diagnosis Date  . Allergy   . Arthritis   . Cancer ([place])    CLL   Past Surgical History:  Procedure Laterality Date  . BACK SURGERY    . BLADDER REPAIR    . CARDIAC CATHETERIZATION    . carpel tunnell Bilateral   . CHOLECYSTECTOMY    . COLONOSCOPY  2015  . CORONARY STENT INTERVENTION N/A 08/16/2019   Procedure: CORONARY STENT INTERVENTION;  Surgeon: Nelva Bush, MD;  Location: [place] [facility];  Service: Cardiovascular;  Laterality: N/A;  . CORONARY STENT INTERVENTION Left 08/19/2019   Procedure: CORONARY STENT INTERVENTION;  Surgeon: Wellington Hampshire, MD;  Location: [place] [facility];  Service: Cardiovascular;  Laterality: Left;  . CORONARY STENT INTERVENTION N/A 02/25/2020   Procedure: CORONARY STENT INTERVENTION;  Surgeon: Nelva Bush, MD;  Location: [place] [facility];  Service: Cardiovascular;  Laterality: N/A;  . LEFT HEART CATH AND CORONARY ANGIOGRAPHY  N/A 08/16/2019   Procedure: LEFT HEART CATH AND CORONARY ANGIOGRAPHY;  Surgeon: Nelva Bush, MD;  Location: [place] [facility];  Service: Cardiovascular;  Laterality: N/A;  . LEFT HEART CATH AND CORONARY ANGIOGRAPHY N/A 02/25/2020   Procedure: LEFT HEART CATH AND CORONARY ANGIOGRAPHY;  Surgeon: Nelva Bush, MD;  Location: [place] [facility];  Service: Cardiovascular;  Laterality: N/A;  . SHOULDER SURGERY Bilateral   . UPPER EXTREMITY ANGIOGRAPHY Right 08/21/2019   Procedure: UPPER EXTREMITY ANGIOGRAPH;  Surgeon: Algernon Huxley, MD;  Location: [place] [facility];  Service: Cardiovascular;  Laterality: Right;    Allergies  Allergies  Allergen Reactions  . Corticosteroids     Other reaction(s): Headache  . Other Other (See Comments)    Seafood  . Sulfa Antibiotics Anaphylaxis  . Clarithromycin     Other reaction(s): Other (See Comments) Rectal bleeding   . Cymbalta [Duloxetine Hcl] Diarrhea  . Prednisone Other (See Comments)    Color changes in face, headache and difficulty walking    History of Present Illness    Cheryl Briggs is a 75 y.o. female with PMH as above. She has reported exposure to secondhand smoke for approximately 20 years via her husband.   Before her 08/2019 NSTEMI, she felt 10/10 chest pain and pressure in her center of her chest with associated shortness of breath, diaphoresis, nausea, emesis x1, and dizziness.  She had decreased exercise tolerance. LHC 08/16/2019 showed severe multivessel CAD with moderately elevated LV filling pressures.  Complex PCI was performed to the dRCA, rPDA, and  rPL using DES extending from the dRCA into the rPDA, as well as angioplasty performed of the ostial rPL branch.  Distal RCA PCI was complicated by no reflow in the distal branches.  Final angiogram showed 0% stenosis in the distal RCA/RPDA and 10% residual stenosis of the ostial RPL. It was thought that the culprit of her non-ST elevation myocardial infarction was the  thrombotic lesion of the distal RCA.    She had transient bradycardia and hypotension with development of atrial fibrillation, thought 2/2 complicated PCI and mediated by acute ischemia, as well as administration of atropine and norepinephrine. She was started on amiodarone (afib) and lasix (elevated filling pressures).  She underwent repeat intervention 08/19/2019 with PCI to distal left circumflex and elevated LVEDP.Post procedure, she experienced a right forearm hematoma with recommendation for conservative management.  After discharge, she was seen in clinic several times and noted she was overall doing well; however, fatigue and dyspnea did not improve until after starting cardiac rehab. On  01/24/2020, she noted significant increase in exercise tolerance. She continued to try and remain active.She planned to walk on the treadmill at the senior center. She could walk as quickly as 3 miles per hour. She was taking tramadol and gabapentin for joint pain. She had atypical sharp chest pain, though improved in frequency. It occured mainly in the morning at rest and with exertion without other clear triggers.    On 02/25/20, she presented to [facility] with worsening angina x2 weeks that as now present at rest. The CP had initially been worse when walking on the treadmill and would feel like left sided non radiating CP then resolve with rest.  It was described as similar to her prior heart attack. Tn mildly elevated.  Cath showed multivessel CAD with 80%D2s, 60%p-m LCx, chronic total occlusion of OM1, 99% mRCA stenosis and 50% rPDA. Nl LVEDP. PCI performed to [facility]. Plan was for ASA and Brilinta for 79mo. Repeat echo LVEF 55-60%, G1DD, mildly reduced RV function.   She was seen in clinic after her NSTEMI with episodes of CP and pressure reported and described as center to L sided and constant pain/pressure/ache, lasting 56m - 24h. Pain was not made worse with exertion and rated up to 5/10 in severity. The week leading  up to her visit, she had CP almost every day. She had not taken SL nitro. She reported feeling very stressed, as multiple people had passed away in the last 6 months. On the way into her visit, she felt SOB. Prior to admission, she was walking 1-2 miles daily. She was walking 1/2 mile occasionally throughout the week. She was going to the senior center instead of cardiac rehab, due to the hour commute. Home BP 120/70s. HS Tn unremarkable. Imdur increased to 30mg  daily and losartan held to prevent hypotension.  Case discussed with her primary cardiologist with recommendation for repeat cardiac catheterization if ongoing chest pain after increasing Imdur to 30 mg daily.  She was last seen in clinic 04/07/2020 by Laurann Montana, NP.  At that time, she reported home BP routinely less than 130/80.  She denied shortness of breath and dyspnea on exertion.  No presyncope.  Heart rate at home 70s to 80s.  She was working out at the senior center and felt overall improved since her Imdur was increased to 30 mg daily.  She had an episode of chest discomfort after exercising and took 2 sublingual nitro with symptomatic relief.  Discussion took place regarding increasing her Imdur  dose with patient hesitant.  It was noted that if chest pain persisted, despite escalation of Imdur, further ischemic evaluation via stress testing or repeat cardiac catheterization would be recommended.  She was noted to have PVCs on EKG with 14-day ZIO monitor performed to further evaluate the burden of this ectopy.    On 04/2020, Imdur was increased to 60 mg daily after experiencing chest pressure in the morning after exercise and needing 1 nitroglycerin.  Chest pressure then resolved at approximately 79 minutes later.  She reported chest discomfort started shortly thereafter.  She rested in a recliner and discomfort resolved.  Her BP since that time had been SBP 120s with DBP 70s and heart rate 80s to 90s.  The morning of her calling into the  office, her BP was 104/69 with HR 72, which was much lower than her usual BP readings.  Recommendation at that time was to increase to Imdur 60 mg daily.  04/2020 Zio monitor showed NSR with I run of ventricular tachycardia, lasting 5 beats with a maximum rate of 133 bpm.  8 SVT runs occurred, the fastest of which lasted 5 beats with maximum rate of 197 bpm, the longest lasting 23.7 seconds with an average rate of 112 bpm.  She had rare PACs and PVCs with a burden less than 1%..  Today, 06/05/2020, she returns to clinic and notes ongoing episodes of chest discomfort that occur after physical activity and now will start at rest sometimes after exercise.  She reports going on a trip this past Wednesday with BP 99/57 and CP/SOB. BP predominantly usually BP 120-110.  Given her ectopy, it is possible that her lower BP was in the setting of a PVC, PAC.  She reports having to take 2 sublingual nitro after working out at the senior center last week.  She wonders if her symptoms are 2/2 her CLL.  In addition, she has started to notice more shortness of breath.  She feels short of breath when walking into today's visit.  She sometimes notices her heart racing.  No presyncope or syncope.  When she has episodes of chest pressure, she sometimes feels that she is choking.  She reports this has been more than 1 episode.  She notes significant fatigue, which is frustrating to her. No s/sx of volume overload or breathing.   Home Medications    Prior to Admission medications   Medication Sig Start Date End Date Taking? Authorizing Provider  aspirin EC 81 MG EC tablet Take 1 tablet (81 mg total) by mouth daily. Swallow whole. 08/19/19  Yes Jennye Boroughs, MD  atorvastatin (LIPITOR) 80 MG tablet Take 1 tablet (80 mg total) by mouth daily. 08/19/19  Yes Jennye Boroughs, MD  Bioflavonoid Products (ESTER C PO) Take 1,000 mg by mouth daily.   Yes [provider]  calcium citrate-vitamin D (CITRACAL+D) 315-200 MG-UNIT tablet  Take 1 tablet by mouth 2 (two) times daily.   Yes [provider]  Chlorpheniramine Maleate (CHLOR-TABLETS PO) Take 1 tablet by mouth as needed.    Yes [provider]  Cholecalciferol (VITAMIN D-1000 MAX ST) 25 MCG (1000 UT) tablet Take 3,000 Units by mouth daily.   Yes [provider]  desonide (DESOWEN) 0.05 % ointment desonide 0.05 % topical ointment 02/21/17  Yes [provider]  gabapentin (NEURONTIN) 300 MG capsule Take 300 mg by mouth 3 (three) times daily. 08/26/19  Yes [provider]  metoprolol succinate (TOPROL-XL) 50 MG 24 hr tablet Take 1.5 tablets (  75 mg total) by mouth daily. 08/20/19  Yes Jennye Boroughs, MD  Multiple Vitamins-Minerals (MULTIVITAMIN WITH MINERALS) tablet Take 1 tablet by mouth daily.   Yes [provider]  Omeprazole (PRILOSEC PO) Take 1 tablet by mouth daily.    Yes [provider]  ticagrelor (BRILINTA) 90 MG TABS tablet Take 1 tablet (90 mg total) by mouth 2 (two) times daily. 08/19/19 08/18/20 Yes Jennye Boroughs, MD    Review of Systems    She reports ongoing chest pain/pressure and SOB/dyspnea, as well as decreased energy. She has racing HR.  She notes feeling of choking an lower BP with CP and DOE. No pnd, orthopnea, n, v, syncope, weight gain, or early satiety.  She reports joint pain, and that she has started on tramadol and gabapentin. She notes fatigue. All other systems reviewed and are otherwise negative except as noted above.  Physical Exam    VS:  BP 120/70 (BP Location: Left Arm, Patient Position: Sitting, Cuff Size: Normal)   Pulse 74   Ht 5\' 2"  (1.575 m)   Wt 135 lb (61.2 kg)   SpO2 96%   BMI 24.69 kg/m  , BMI Body mass index is 24.69 kg/m. GEN: Well nourished, well developed, in no acute distress. Joined by her daughter. HEENT: normal. Neck: Supple, no JVD, carotid bruits, or masses. Cardiac: RRR, 1/6 systolic murmur in right upper sternal border, rubs, or gallops. No clubbing,  cyanosis, edema. Radial pulses 2+ bilaterally.     Respiratory:  Respirations regular and unlabored, clear to auscultation bilaterally. GI: Soft, nontender, nondistended, BS + x 4. MS: no deformity or atrophy. Skin: warm and dry, no rash. Neuro:  Strength and sensation are intact. Psych: Normal affect.  Accessory Clinical Findings    ECG personally reviewed by me today -NSR, 86 bpm, occasional PVCs, poor R wave progression in III, avF and V1-III, previous TWI and PVCs resolved.  - no acute changes.  VITALS Reviewed today   Temp Readings from Last 3 Encounters:  05/11/20 (!) 97.4 F (36.3 C)  02/26/20 98.8 F (37.1 C) (Oral)  08/22/19 97.8 F (36.6 C) (Oral)   BP Readings from Last 3 Encounters:  06/05/20 120/70  05/11/20 134/73  04/07/20 110/60   Pulse Readings from Last 3 Encounters:  06/05/20 74  05/11/20 91  04/07/20 91    Wt Readings from Last 3 Encounters:  06/05/20 135 lb (61.2 kg)  05/11/20 136 lb 3.9 oz (61.8 kg)  04/07/20 136 lb (61.7 kg)     LABS  reviewed today   Lab Results  Component Value Date   WBC 12.5 (H) 05/11/2020   HGB 14.4 05/11/2020   HCT 43.5 05/11/2020   MCV 98.9 05/11/2020   PLT 258 05/11/2020   Lab Results  Component Value Date   CREATININE 0.70 03/16/2020   BUN 7 (L) 03/16/2020   NA 138 03/16/2020   K 3.9 03/16/2020   CL 104 03/16/2020   CO2 23 03/16/2020   Lab Results  Component Value Date   ALT 29 10/06/2017   AST 36 10/06/2017   ALKPHOS 75 10/06/2017   BILITOT 0.6 10/06/2017   Lab Results  Component Value Date   CHOL 139 09/24/2019   HDL 61 09/24/2019   LDLCALC 54 09/24/2019   LDLDIRECT 51 09/24/2019   TRIG 138 09/24/2019   CHOLHDL 2.3 09/24/2019    Lab Results  Component Value Date   HGBA1C 6.0 (H) 08/15/2019   Lab Results  Component Value Date  TSH 1.114 02/25/2020     STUDIES/PROCEDURES reviewed today   Echo 02/2020 1. Left ventricular ejection fraction, by estimation, is 55 to 60%. The  left  ventricle has normal function. Left ventricular endocardial border  not optimally defined to evaluate regional wall motion. There is mild left  ventricular hypertrophy. Left  ventricular diastolic parameters are consistent with Grade I diastolic  dysfunction (impaired relaxation).  2. Right ventricular systolic function is mildly reduced. The right  ventricular size is normal. Mildly increased right ventricular wall  thickness. There is normal pulmonary artery systolic pressure.  3. The mitral valve is normal in structure. Mild mitral valve  regurgitation. No evidence of mitral stenosis.  4. The aortic valve is tricuspid. There is mild calcification of the  aortic valve. There is mild thickening of the aortic valve. Aortic valve  regurgitation is not visualized. Mild to moderate aortic valve  sclerosis/calcification is present, without any  evidence of aortic stenosis.  5. The inferior vena cava is normal in size with <50% respiratory  variability, suggesting right atrial pressure of 8 mmHg.   Cardiac cath 02/2020 Conclusions: 1. Multivessel coronary artery disease, including 80% D2 stenosis, 60% prox/mid LCx disease, chronic total occlusion of OM1, 99% mid RCA stenosis, and 50% rPDA lesion. 2. Widely patent LCx and distal RCA/PDA stents. 3. Normal left ventricular filling pressure. 4. Successful PCI to mid RCA using Resolute Onyx 2.75 x 15 mm drug-eluting stent with 0% residual stenosis and TIMI-3 flow.  Recommendations: 1. Continue dual antiplatelet therapy with aspirin and ticagrelor x 12 months. 2. Aggressive secondary prevention. 3. Obtain echocardiogram. 4. Anticipate discharge home tomorrow if no post-cath complications.  Nelva Bush, MD [facility]   Coronary Diagrams   Diagnostic Dominance: Right    Intervention       2D echo 08/15/2019: 1. Left ventricular ejection fraction, by estimation, is 55 to 60%. The  left ventricle has normal  function. The left ventricle has no regional  wall motion abnormalities. There is mild left ventricular hypertrophy.  Left ventricular diastolic parameters  were normal.  2. Right ventricular systolic function is normal. The right ventricular  size is normal. There is normal pulmonary artery systolic pressure.  3. The mitral valve is normal in structure. Mild mitral valve  regurgitation. No evidence of mitral stenosis.  4. The aortic valve is abnormal. Aortic valve regurgitation is not  visualized. Mild to moderate aortic valve sclerosis/calcification is  present, without any evidence of aortic stenosis.  __________   LHC 08/16/2019: A drug-eluting stent was successfully placed using a STENT SYNERGY DES 2.25X20.  A stent was successfully placed. Conclusions: 1. 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 2. Moderately elevated left ventricular filling pressure. 3. Complex PCI to distal RCA, rPDA, and rPLA using Synergy 2.25 x 20 mm drug-eluting stent extending from the distal RCA into the rPDA, as well as angioplasty of the ostial rPL branch. Intervention was complicated by no-reflow with transient hypotension/bradycardia and subsequent development of atrial fibrillation with rapid ventricular response. Final angiogram shows 0% residual stenosis in the distal RCA/rPDA and 10% residual stenosis at ostial rPL with TIMI-2 flow. Recommendations: 1. Transfer to ICU for aggressive medical therapy and post-PCI monitoring. 2. Continue tirofiban infusion for 18 hours. 3. Dual antiplatelet therapy with aspirin and ticagrelor for at least 12 months. 4. Titrate  nitroglycerine infusion for relief of chest pain. 5. Continue amiodarone infusion for rate control and hopefully conversion to sinus rhythm. If  patient remains in atrial fibrillation tomorrow, IV heparin should be started (will defer heparin at this time given tirofiban infusion). 6. Aggressive secondary prevention. 7. Anticipate relook catheterization on Monday with PCI to the LCx. I favor medical therapy for D2, given relatively small vessel size and need to stent back to the LAD, were intervention undertaken. __________  LHC 08/19/2019:  2nd Diag lesion is 80% stenosed.  1st Mrg lesion is 100% stenosed.  Prox Cx to Mid Cx lesion is 60% stenosed.  Dist Cx lesion is 80% stenosed.  Mid RCA lesion is 55% stenosed.  RPAV lesion is 10% stenosed.  Balloon angioplasty was performed.  Previously placed Dist RCA drug-eluting stents are widely patent with 0% stenosed side branch in RPDA.  Post intervention, there is a 0% residual stenosis.  A drug-eluting stent was successfully placed using a STENT RESOLUTE ONYX 2.75X18.  1. Widely patent RCA stent with normal flow distally. Unchanged left coronary artery disease. 2. Mildly elevated left ventricular end-diastolic pressure at 14 mmHg. 3. Successful direct stenting of the distal left circumflex. Recommendations: Continue dual antiplatelet therapy for 1 year. Aggressive treatment of risk factors. If the patient remains stable, she can potentially be discharged home in the afternoon.  Assessment & Plan   Coronary artery disease with unstable angina S/p PCI 08/2019, 02/2020 History of NSTEMI --Ongoing CP, despite increase to Imdur 60mg  daily.  DES to distal RCA and distal left circumflex 08/2019.  PCI to her [facility] 02/2020.  EKG today stable from previous and with resolution of previous PVCs.  She reports improvement in chest pain since Imdur increased to 60 mg daily; however, she still continues to have episodes of chest pain after her workouts and ongoing fatigue.  In addition, she now reports dyspnea and a shortness of breath/choking that often occurs with her chest discomfort.   Case previously discussed with primary cardiologist after her 03/2020 visit with recommendation to proceed with repeat catheterization if ongoing chest discomfort, despite escalation of Imdur.  This was discussed with patient understanding today and agreement to proceed with repeat ischemic evaluation with catheterization.  Continue Toprol and DAPT with ASA and ticagrelor for at least 12 months.  We will restart losartan 12.5 mg daily to see if tolerated.  Continue current Imdur 60 mg daily.  No s/sx of bleeding. Continue secondary prevention of risk factors, including statin therapy.  Will make sure most recent labs are updated prior to catheterization. Will plan for echo s/p LHC to reassess structure.  Shared Decision Making/Informed Consent The risks [stroke (1 in 1000), death (1 in 1000), kidney failure [usually temporary] (1 in 500), bleeding (1 in 200), allergic reaction [possibly serious] (1 in 200)], benefits (diagnostic support and management of coronary artery disease) and alternatives of a cardiac catheterization were discussed in detail with Ms. Ramakrishnan and she is willing to proceed.  Atrial fibrillation with RVR, s/p PCI -- Reports racing heart rate with repeat Zio monitor performed as above and with paroxysmal SVT and 1 run of ventricular tachycardia but otherwise unremarkable.  H/o A. fib with RVR, which developed during complicated PCI and thought likely mediated by acute ischemia and administration of atropine and norepinephrine.  She was discharged on oral amiodarone, since discontinued due maintaining NSR.  She is still NSR today with previous PVCs resolved on EKG.  Continue Toprol for rate control.  No recommendation for  anticoagulation as noted in past and given her likely etiology of her atrial fibrillation was 2/2 acute ischemia during catheterization.    Chronic HFpEF --Reports dyspnea/shortness of breath with episodes of chest pain.  Most recent echo as above with EF normal, mild  LVH, mildly reduced RVSP.  Euvolemic and well compensated on exam.  No indication for loop diuretic at this time.  She continues on a low-salt diet and total fluid under 2 L as recommended.  Continue current Toprol.  Given she has tolerated Imdur well, will restart losartan 12.5 mg to see if this trial restart is well-tolerated. Continue diet and exercise as discussed. Will plan for echo s/p LHC as above.  HLD --LDL 54 and at goal of below 70 per most recent labs.  Continue high intensity atorvastatin 80 mg daily for target LDL less than 70.    Hypertension -BP well controlled.  No medication changes.  Continue current Toprol.  Trial restart of losartan 12.5 mg, given she has tolerated increased Imdur at this time. Continue to monitor BP at home with goal BP 130/80 or lower.  Mitral regurgitation --Mild to moderate by initial echo.  Plan for echo s/p catheterization to reassess.  Elevated A1C -Previous 08/2019 A1c elevated at 6.0.  Recommend glycemic control and management per PCP.  Will restart losartan given escalated Imdur was well tolerated, and given comorbid HTN.  Consider addition of Farxiga at RTC.  Medication changes:Trial restart losartan 12.5mg .  Labs ordered: CBC, BMET. Studies / Imaging ordered: LHC, echo.  Future considerations: Farxiga. Disposition: RTC 2 weeks  Arvil Chaco, PA-C 06/05/2020

## 2020-06-05 NOTE — Telephone Encounter (Signed)
Called to notify pt her arrival time for cath procedure on 06/18/20 is 8:00 AM.  Pt voiced understanding.

## 2020-06-05 NOTE — Telephone Encounter (Signed)
Spoke to pt and confirmed that [name] did want her to restart Losartan 12.5mg  daily as a trial and to call the office to let us know how she is doing.  Pt voiced understanding.

## 2020-06-05 NOTE — H&P (View-Only) (Signed)
Office Visit   Patient Name: Cheryl Briggs Date of Encounter: 06/05/2020  PCP:  Joyice Faster, [place]  Cardiologist:  Kathlyn Sacramento, MD  Advanced Practice Provider:  Arvil Chaco, PA-C Electrophysiologist:  None   Chief Complaint    Chief Complaint  Patient presents with  . Follow-up    8 Weeks follow up and follow up zio results. Patient c/o feeling very fatigue lately. Medications verbally reviewed with patient.     75 y.o. female with a hx of CLL, arthritis, secondhand smoke exposure, non-STEMI with subsequent LHC 08/2019 and significant 3v CAD and complicated/staged PCI (0/8/67 dRCA, 08/19/19 dLCx), PCI on 8/6 complicated by no reflow and bradycardia/hypotension/atrial fibrillation with RVR, post procedure right forearm hematoma, 2/15/222 NSTEMI and PCI to [facility]. HFpEF, hypertension, mitral regurgitation, hyperlipidemia, and here for 1 week RTC after ongoing CP s/p NSTEMI and PCI to [facility] 02/25/20.   Past Medical History    Past Medical History:  Diagnosis Date  . Allergy   . Arthritis   . Cancer ([place])    CLL   Past Surgical History:  Procedure Laterality Date  . BACK SURGERY    . BLADDER REPAIR    . CARDIAC CATHETERIZATION    . carpel tunnell Bilateral   . CHOLECYSTECTOMY    . COLONOSCOPY  2015  . CORONARY STENT INTERVENTION N/A 08/16/2019   Procedure: CORONARY STENT INTERVENTION;  Surgeon: Nelva Bush, MD;  Location: [place] [facility];  Service: Cardiovascular;  Laterality: N/A;  . CORONARY STENT INTERVENTION Left 08/19/2019   Procedure: CORONARY STENT INTERVENTION;  Surgeon: Wellington Hampshire, MD;  Location: [place] [facility];  Service: Cardiovascular;  Laterality: Left;  . CORONARY STENT INTERVENTION N/A 02/25/2020   Procedure: CORONARY STENT INTERVENTION;  Surgeon: Nelva Bush, MD;  Location: [place] [facility];  Service: Cardiovascular;  Laterality: N/A;  . LEFT HEART CATH AND CORONARY ANGIOGRAPHY  N/A 08/16/2019   Procedure: LEFT HEART CATH AND CORONARY ANGIOGRAPHY;  Surgeon: Nelva Bush, MD;  Location: [place] [facility];  Service: Cardiovascular;  Laterality: N/A;  . LEFT HEART CATH AND CORONARY ANGIOGRAPHY N/A 02/25/2020   Procedure: LEFT HEART CATH AND CORONARY ANGIOGRAPHY;  Surgeon: Nelva Bush, MD;  Location: [place] [facility];  Service: Cardiovascular;  Laterality: N/A;  . SHOULDER SURGERY Bilateral   . UPPER EXTREMITY ANGIOGRAPHY Right 08/21/2019   Procedure: UPPER EXTREMITY ANGIOGRAPH;  Surgeon: Algernon Huxley, MD;  Location: [place] [facility];  Service: Cardiovascular;  Laterality: Right;    Allergies  Allergies  Allergen Reactions  . Corticosteroids     Other reaction(s): Headache  . Other Other (See Comments)    Seafood  . Sulfa Antibiotics Anaphylaxis  . Clarithromycin     Other reaction(s): Other (See Comments) Rectal bleeding   . Cymbalta [Duloxetine Hcl] Diarrhea  . Prednisone Other (See Comments)    Color changes in face, headache and difficulty walking    History of Present Illness    [name] is a 75 y.o. female with PMH as above. She has reported exposure to secondhand smoke for approximately 20 years via her husband.   Before her 08/2019 NSTEMI, she felt 10/10 chest pain and pressure in her center of her chest with associated shortness of breath, diaphoresis, nausea, emesis x1, and dizziness.  She had decreased exercise tolerance. LHC 08/16/2019 showed severe multivessel CAD with moderately elevated LV filling pressures.  Complex PCI was performed to the dRCA, rPDA, and  rPL using DES extending from the dRCA into the rPDA, as well as angioplasty performed of the ostial rPL branch.  Distal RCA PCI was complicated by no reflow in the distal branches.  Final angiogram showed 0% stenosis in the distal RCA/RPDA and 10% residual stenosis of the ostial RPL. It was thought that the culprit of her non-ST elevation myocardial infarction was the  thrombotic lesion of the distal RCA.    She had transient bradycardia and hypotension with development of atrial fibrillation, thought 2/2 complicated PCI and mediated by acute ischemia, as well as administration of atropine and norepinephrine. She was started on amiodarone (afib) and lasix (elevated filling pressures).  She underwent repeat intervention 08/19/2019 with PCI to distal left circumflex and elevated LVEDP.Post procedure, she experienced a right forearm hematoma with recommendation for conservative management.  After discharge, she was seen in clinic several times and noted she was overall doing well; however, fatigue and dyspnea did not improve until after starting cardiac rehab. On  01/24/2020, she noted significant increase in exercise tolerance. She continued to try and remain active.She planned to walk on the treadmill at the senior center. She could walk as quickly as 3 miles per hour. She was taking tramadol and gabapentin for joint pain. She had atypical sharp chest pain, though improved in frequency. It occured mainly in the morning at rest and with exertion without other clear triggers.    On 02/25/20, she presented to [facility] with worsening angina x2 weeks that as now present at rest. The CP had initially been worse when walking on the treadmill and would feel like left sided non radiating CP then resolve with rest.  It was described as similar to her prior heart attack. Tn mildly elevated.  Cath showed multivessel CAD with 80%D2s, 60%p-m LCx, chronic total occlusion of OM1, 99% mRCA stenosis and 50% rPDA. Nl LVEDP. PCI performed to [facility]. Plan was for ASA and Brilinta for 52mo. Repeat echo LVEF 55-60%, G1DD, mildly reduced RV function.   She was seen in clinic after her NSTEMI with episodes of CP and pressure reported and described as center to L sided and constant pain/pressure/ache, lasting 97m - 24h. Pain was not made worse with exertion and rated up to 5/10 in severity. The week leading  up to her visit, she had CP almost every day. She had not taken SL nitro. She reported feeling very stressed, as multiple people had passed away in the last 6 months. On the way into her visit, she felt SOB. Prior to admission, she was walking 1-2 miles daily. She was walking 1/2 mile occasionally throughout the week. She was going to the senior center instead of cardiac rehab, due to the hour commute. Home BP 120/70s. HS Tn unremarkable. Imdur increased to 30mg  daily and losartan held to prevent hypotension.  Case discussed with her primary cardiologist with recommendation for repeat cardiac catheterization if ongoing chest pain after increasing Imdur to 30 mg daily.  She was last seen in clinic 04/07/2020 by Laurann Montana, NP.  At that time, she reported home BP routinely less than 130/80.  She denied shortness of breath and dyspnea on exertion.  No presyncope.  Heart rate at home 70s to 80s.  She was working out at the senior center and felt overall improved since her Imdur was increased to 30 mg daily.  She had an episode of chest discomfort after exercising and took 2 sublingual nitro with symptomatic relief.  Discussion took place regarding increasing her Imdur  dose with patient hesitant.  It was noted that if chest pain persisted, despite escalation of Imdur, further ischemic evaluation via stress testing or repeat cardiac catheterization would be recommended.  She was noted to have PVCs on EKG with 14-day ZIO monitor performed to further evaluate the burden of this ectopy.    On 04/2020, Imdur was increased to 60 mg daily after experiencing chest pressure in the morning after exercise and needing 1 nitroglycerin.  Chest pressure then resolved at approximately 79 minutes later.  She reported chest discomfort started shortly thereafter.  She rested in a recliner and discomfort resolved.  Her BP since that time had been SBP 120s with DBP 70s and heart rate 80s to 90s.  The morning of her calling into the  office, her BP was 104/69 with HR 72, which was much lower than her usual BP readings.  Recommendation at that time was to increase to Imdur 60 mg daily.  04/2020 Zio monitor showed NSR with I run of ventricular tachycardia, lasting 5 beats with a maximum rate of 133 bpm.  8 SVT runs occurred, the fastest of which lasted 5 beats with maximum rate of 197 bpm, the longest lasting 23.7 seconds with an average rate of 112 bpm.  She had rare PACs and PVCs with a burden less than 1%..  Today, 06/05/2020, she returns to clinic and notes ongoing episodes of chest discomfort that occur after physical activity and now will start at rest sometimes after exercise.  She reports going on a trip this past Wednesday with BP 99/57 and CP/SOB. BP predominantly usually BP 120-110.  Given her ectopy, it is possible that her lower BP was in the setting of a PVC, PAC.  She reports having to take 2 sublingual nitro after working out at the senior center last week.  She wonders if her symptoms are 2/2 her CLL.  In addition, she has started to notice more shortness of breath.  She feels short of breath when walking into today's visit.  She sometimes notices her heart racing.  No presyncope or syncope.  When she has episodes of chest pressure, she sometimes feels that she is choking.  She reports this has been more than 1 episode.  She notes significant fatigue, which is frustrating to her. No s/sx of volume overload or breathing.   Home Medications    Prior to Admission medications   Medication Sig Start Date End Date Taking? Authorizing Provider  aspirin EC 81 MG EC tablet Take 1 tablet (81 mg total) by mouth daily. Swallow whole. 08/19/19  Yes Jennye Boroughs, MD  atorvastatin (LIPITOR) 80 MG tablet Take 1 tablet (80 mg total) by mouth daily. 08/19/19  Yes Jennye Boroughs, MD  Bioflavonoid Products (ESTER C PO) Take 1,000 mg by mouth daily.   Yes [provider]  calcium citrate-vitamin D (CITRACAL+D) 315-200 MG-UNIT tablet  Take 1 tablet by mouth 2 (two) times daily.   Yes [provider]  Chlorpheniramine Maleate (CHLOR-TABLETS PO) Take 1 tablet by mouth as needed.    Yes [provider]  Cholecalciferol (VITAMIN D-1000 MAX ST) 25 MCG (1000 UT) tablet Take 3,000 Units by mouth daily.   Yes [provider]  desonide (DESOWEN) 0.05 % ointment desonide 0.05 % topical ointment 02/21/17  Yes [provider]  gabapentin (NEURONTIN) 300 MG capsule Take 300 mg by mouth 3 (three) times daily. 08/26/19  Yes [provider]  metoprolol succinate (TOPROL-XL) 50 MG 24 hr tablet Take 1.5 tablets (  75 mg total) by mouth daily. 08/20/19  Yes Jennye Boroughs, MD  Multiple Vitamins-Minerals (MULTIVITAMIN WITH MINERALS) tablet Take 1 tablet by mouth daily.   Yes [provider]  Omeprazole (PRILOSEC PO) Take 1 tablet by mouth daily.    Yes [provider]  ticagrelor (BRILINTA) 90 MG TABS tablet Take 1 tablet (90 mg total) by mouth 2 (two) times daily. 08/19/19 08/18/20 Yes Jennye Boroughs, MD    Review of Systems    She reports ongoing chest pain/pressure and SOB/dyspnea, as well as decreased energy. She has racing HR.  She notes feeling of choking an lower BP with CP and DOE. No pnd, orthopnea, n, v, syncope, weight gain, or early satiety.  She reports joint pain, and that she has started on tramadol and gabapentin. She notes fatigue. All other systems reviewed and are otherwise negative except as noted above.  Physical Exam    VS:  BP 120/70 (BP Location: Left Arm, Patient Position: Sitting, Cuff Size: Normal)   Pulse 74   Ht 5\' 2"  (1.575 m)   Wt 135 lb (61.2 kg)   SpO2 96%   BMI 24.69 kg/m  , BMI Body mass index is 24.69 kg/m. GEN: Well nourished, well developed, in no acute distress. Joined by her daughter. HEENT: normal. Neck: Supple, no JVD, carotid bruits, or masses. Cardiac: RRR, 1/6 systolic murmur in right upper sternal border, rubs, or gallops. No clubbing,  cyanosis, edema. Radial pulses 2+ bilaterally.     Respiratory:  Respirations regular and unlabored, clear to auscultation bilaterally. GI: Soft, nontender, nondistended, BS + x 4. MS: no deformity or atrophy. Skin: warm and dry, no rash. Neuro:  Strength and sensation are intact. Psych: Normal affect.  Accessory Clinical Findings    ECG personally reviewed by me today -NSR, 86 bpm, occasional PVCs, poor R wave progression in III, avF and V1-III, previous TWI and PVCs resolved.  - no acute changes.  VITALS Reviewed today   Temp Readings from Last 3 Encounters:  05/11/20 (!) 97.4 F (36.3 C)  02/26/20 98.8 F (37.1 C) (Oral)  08/22/19 97.8 F (36.6 C) (Oral)   BP Readings from Last 3 Encounters:  06/05/20 120/70  05/11/20 134/73  04/07/20 110/60   Pulse Readings from Last 3 Encounters:  06/05/20 74  05/11/20 91  04/07/20 91    Wt Readings from Last 3 Encounters:  06/05/20 135 lb (61.2 kg)  05/11/20 136 lb 3.9 oz (61.8 kg)  04/07/20 136 lb (61.7 kg)     LABS  reviewed today   Lab Results  Component Value Date   WBC 12.5 (H) 05/11/2020   HGB 14.4 05/11/2020   HCT 43.5 05/11/2020   MCV 98.9 05/11/2020   PLT 258 05/11/2020   Lab Results  Component Value Date   CREATININE 0.70 03/16/2020   BUN 7 (L) 03/16/2020   NA 138 03/16/2020   K 3.9 03/16/2020   CL 104 03/16/2020   CO2 23 03/16/2020   Lab Results  Component Value Date   ALT 29 10/06/2017   AST 36 10/06/2017   ALKPHOS 75 10/06/2017   BILITOT 0.6 10/06/2017   Lab Results  Component Value Date   CHOL 139 09/24/2019   HDL 61 09/24/2019   LDLCALC 54 09/24/2019   LDLDIRECT 51 09/24/2019   TRIG 138 09/24/2019   CHOLHDL 2.3 09/24/2019    Lab Results  Component Value Date   HGBA1C 6.0 (H) 08/15/2019   Lab Results  Component Value Date  TSH 1.114 02/25/2020     STUDIES/PROCEDURES reviewed today   Echo 02/2020 1. Left ventricular ejection fraction, by estimation, is 55 to 60%. The  left  ventricle has normal function. Left ventricular endocardial border  not optimally defined to evaluate regional wall motion. There is mild left  ventricular hypertrophy. Left  ventricular diastolic parameters are consistent with Grade I diastolic  dysfunction (impaired relaxation).  2. Right ventricular systolic function is mildly reduced. The right  ventricular size is normal. Mildly increased right ventricular wall  thickness. There is normal pulmonary artery systolic pressure.  3. The mitral valve is normal in structure. Mild mitral valve  regurgitation. No evidence of mitral stenosis.  4. The aortic valve is tricuspid. There is mild calcification of the  aortic valve. There is mild thickening of the aortic valve. Aortic valve  regurgitation is not visualized. Mild to moderate aortic valve  sclerosis/calcification is present, without any  evidence of aortic stenosis.  5. The inferior vena cava is normal in size with <50% respiratory  variability, suggesting right atrial pressure of 8 mmHg.   Cardiac cath 02/2020 Conclusions: 1. Multivessel coronary artery disease, including 80% D2 stenosis, 60% prox/mid LCx disease, chronic total occlusion of OM1, 99% mid RCA stenosis, and 50% rPDA lesion. 2. Widely patent LCx and distal RCA/PDA stents. 3. Normal left ventricular filling pressure. 4. Successful PCI to mid RCA using Resolute Onyx 2.75 x 15 mm drug-eluting stent with 0% residual stenosis and TIMI-3 flow.  Recommendations: 1. Continue dual antiplatelet therapy with aspirin and ticagrelor x 12 months. 2. Aggressive secondary prevention. 3. Obtain echocardiogram. 4. Anticipate discharge home tomorrow if no post-cath complications.  Nelva Bush, MD [facility]   Coronary Diagrams   Diagnostic Dominance: Right    Intervention       2D echo 08/15/2019: 1. Left ventricular ejection fraction, by estimation, is 55 to 60%. The  left ventricle has normal  function. The left ventricle has no regional  wall motion abnormalities. There is mild left ventricular hypertrophy.  Left ventricular diastolic parameters  were normal.  2. Right ventricular systolic function is normal. The right ventricular  size is normal. There is normal pulmonary artery systolic pressure.  3. The mitral valve is normal in structure. Mild mitral valve  regurgitation. No evidence of mitral stenosis.  4. The aortic valve is abnormal. Aortic valve regurgitation is not  visualized. Mild to moderate aortic valve sclerosis/calcification is  present, without any evidence of aortic stenosis.  __________   LHC 08/16/2019: A drug-eluting stent was successfully placed using a STENT SYNERGY DES 2.25X20.  A stent was successfully placed. Conclusions: 1. 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 2. Moderately elevated left ventricular filling pressure. 3. Complex PCI to distal RCA, rPDA, and rPLA using Synergy 2.25 x 20 mm drug-eluting stent extending from the distal RCA into the rPDA, as well as angioplasty of the ostial rPL branch. Intervention was complicated by no-reflow with transient hypotension/bradycardia and subsequent development of atrial fibrillation with rapid ventricular response. Final angiogram shows 0% residual stenosis in the distal RCA/rPDA and 10% residual stenosis at ostial rPL with TIMI-2 flow. Recommendations: 1. Transfer to ICU for aggressive medical therapy and post-PCI monitoring. 2. Continue tirofiban infusion for 18 hours. 3. Dual antiplatelet therapy with aspirin and ticagrelor for at least 12 months. 4. Titrate  nitroglycerine infusion for relief of chest pain. 5. Continue amiodarone infusion for rate control and hopefully conversion to sinus rhythm. If  patient remains in atrial fibrillation tomorrow, IV heparin should be started (will defer heparin at this time given tirofiban infusion). 6. Aggressive secondary prevention. 7. Anticipate relook catheterization on Monday with PCI to the LCx. I favor medical therapy for D2, given relatively small vessel size and need to stent back to the LAD, were intervention undertaken. __________  LHC 08/19/2019:  2nd Diag lesion is 80% stenosed.  1st Mrg lesion is 100% stenosed.  Prox Cx to Mid Cx lesion is 60% stenosed.  Dist Cx lesion is 80% stenosed.  Mid RCA lesion is 55% stenosed.  RPAV lesion is 10% stenosed.  Balloon angioplasty was performed.  Previously placed Dist RCA drug-eluting stents are widely patent with 0% stenosed side branch in RPDA.  Post intervention, there is a 0% residual stenosis.  A drug-eluting stent was successfully placed using a STENT RESOLUTE ONYX 2.75X18.  1. Widely patent RCA stent with normal flow distally. Unchanged left coronary artery disease. 2. Mildly elevated left ventricular end-diastolic pressure at 14 mmHg. 3. Successful direct stenting of the distal left circumflex. Recommendations: Continue dual antiplatelet therapy for 1 year. Aggressive treatment of risk factors. If the patient remains stable, she can potentially be discharged home in the afternoon.  Assessment & Plan   Coronary artery disease with unstable angina S/p PCI 08/2019, 02/2020 History of NSTEMI --Ongoing CP, despite increase to Imdur 60mg  daily.  DES to distal RCA and distal left circumflex 08/2019.  PCI to her [facility] 02/2020.  EKG today stable from previous and with resolution of previous PVCs.  She reports improvement in chest pain since Imdur increased to 60 mg daily; however, she still continues to have episodes of chest pain after her workouts and ongoing fatigue.  In addition, she now reports dyspnea and a shortness of breath/choking that often occurs with her chest discomfort.   Case previously discussed with primary cardiologist after her 03/2020 visit with recommendation to proceed with repeat catheterization if ongoing chest discomfort, despite escalation of Imdur.  This was discussed with patient understanding today and agreement to proceed with repeat ischemic evaluation with catheterization.  Continue Toprol and DAPT with ASA and ticagrelor for at least 12 months.  We will restart losartan 12.5 mg daily to see if tolerated.  Continue current Imdur 60 mg daily.  No s/sx of bleeding. Continue secondary prevention of risk factors, including statin therapy.  Will make sure most recent labs are updated prior to catheterization. Will plan for echo s/p LHC to reassess structure.  Shared Decision Making/Informed Consent The risks [stroke (1 in 1000), death (1 in 1000), kidney failure [usually temporary] (1 in 500), bleeding (1 in 200), allergic reaction [possibly serious] (1 in 200)], benefits (diagnostic support and management of coronary artery disease) and alternatives of a cardiac catheterization were discussed in detail with Ms. Renbarger and she is willing to proceed.  Atrial fibrillation with RVR, s/p PCI -- Reports racing heart rate with repeat Zio monitor performed as above and with paroxysmal SVT and 1 run of ventricular tachycardia but otherwise unremarkable.  H/o A. fib with RVR, which developed during complicated PCI and thought likely mediated by acute ischemia and administration of atropine and norepinephrine.  She was discharged on oral amiodarone, since discontinued due maintaining NSR.  She is still NSR today with previous PVCs resolved on EKG.  Continue Toprol for rate control.  No recommendation for  anticoagulation as noted in past and given her likely etiology of her atrial fibrillation was 2/2 acute ischemia during catheterization.    Chronic HFpEF --Reports dyspnea/shortness of breath with episodes of chest pain.  Most recent echo as above with EF normal, mild  LVH, mildly reduced RVSP.  Euvolemic and well compensated on exam.  No indication for loop diuretic at this time.  She continues on a low-salt diet and total fluid under 2 L as recommended.  Continue current Toprol.  Given she has tolerated Imdur well, will restart losartan 12.5 mg to see if this trial restart is well-tolerated. Continue diet and exercise as discussed. Will plan for echo s/p LHC as above.  HLD --LDL 54 and at goal of below 70 per most recent labs.  Continue high intensity atorvastatin 80 mg daily for target LDL less than 70.    Hypertension -BP well controlled.  No medication changes.  Continue current Toprol.  Trial restart of losartan 12.5 mg, given she has tolerated increased Imdur at this time. Continue to monitor BP at home with goal BP 130/80 or lower.  Mitral regurgitation --Mild to moderate by initial echo.  Plan for echo s/p catheterization to reassess.  Elevated A1C -Previous 08/2019 A1c elevated at 6.0.  Recommend glycemic control and management per PCP.  Will restart losartan given escalated Imdur was well tolerated, and given comorbid HTN.  Consider addition of Farxiga at RTC.  Medication changes:Trial restart losartan 12.5mg .  Labs ordered: CBC, BMET. Studies / Imaging ordered: LHC, echo.  Future considerations: Farxiga. Disposition: RTC 2 weeks  Arvil Chaco, PA-C 06/05/2020

## 2020-06-05 NOTE — Telephone Encounter (Signed)
Pt c/o medication issue:  1. Name of Medication: losartan  2. How are you currently taking this medication (dosage and times per day)? Please advise  3. Are you having a reaction (difficulty breathing--STAT)?    4. What is your medication issue? Patient calling to clarify if she is to start taking again per conversation at visit but not on avs.   Please call to discuss if starting losartan again.

## 2020-06-06 LAB — BASIC METABOLIC PANEL
BUN/Creatinine Ratio: 10 — ABNORMAL LOW (ref 12–28)
BUN: 7 mg/dL — ABNORMAL LOW (ref 8–27)
CO2: 21 mmol/L (ref 20–29)
Calcium: 9.3 mg/dL (ref 8.7–10.3)
Chloride: 101 mmol/L (ref 96–106)
Creatinine, Ser: 0.73 mg/dL (ref 0.57–1.00)
Glucose: 94 mg/dL (ref 65–99)
Potassium: 4.4 mmol/L (ref 3.5–5.2)
Sodium: 137 mmol/L (ref 134–144)
eGFR: 86 mL/min/{1.73_m2} (ref >59)

## 2020-06-06 LAB — CBC
Hematocrit: 41.7 % (ref 34.0–46.6)
Hemoglobin: 14.2 g/dL (ref 11.1–15.9)
MCH: 32.9 pg (ref 26.6–33.0)
MCHC: 34.1 g/dL (ref 31.5–35.7)
MCV: 97 fL (ref 79–97)
Platelets: 272 10*3/uL (ref 150–450)
RBC: 4.32 x10E6/uL (ref 3.77–5.28)
RDW: 11.8 % (ref 11.7–15.4)
WBC: 11.9 10*3/uL — ABNORMAL HIGH (ref 3.4–10.8)

## 2020-06-09 ENCOUNTER — Telehealth: Payer: Self-pay | Admitting: Physician Assistant

## 2020-06-09 NOTE — Telephone Encounter (Signed)
Pt seen in office with Jacquelyn 06/05/20.  Trial restart of losartan 12.5 mg at visit, "given she has tolerated increased Imdur at this time."  Pt to continue to monitor BP at home with goal BP 130/80 or lower.  Pt calling to give update since restarting Losartan 12.5mg .   Pt gives BP/HR readings below. To note, pt states these readings are mostly unchanged from prior to restarting Losartan:   (Sat) 5/28 0940 (after meds) - BP 118/69  HR 82  (Sun) 5/29 0710 (before meds) - BP 130/78  HR 75           5/29 1000 (after meds) - BP 109/65  HR 78  (Mon) 5/30 0920 (after meds) - BP 116/65  HR 90           5/30 1450 (afternoon after meds) - BP 112/64 HR 76  Notified pt I will send these to provider to update.  Pt currently scheduled for [facility] 06/18/20.   (Tues) This morning 0915 (after meds) - BP 126/74  HR 97

## 2020-06-09 NOTE — Telephone Encounter (Signed)
Patient calling in to report that she has started the losartan ( 5/29) 12.5 mg and does not seem to have any issues so far. Patient states "I don't feel good but I don't feel any worse"

## 2020-06-10 NOTE — Telephone Encounter (Signed)
I spoke with the patient. I have advised her of Jacquelyn's recommendations as stated below. The patient voices understanding.  She did state she needed a refill on her Brilinta.  I advised her upon reviewing her chart, that a 90-day supply was sent in to [facility] by [place], [place] on 06/05/20. The patient will reach out to the pharmacy.  She had no further questions/ concerns at this time.

## 2020-06-10 NOTE — Telephone Encounter (Signed)
Good - glad to hear that she tolerated the Losartan.  No changes from those discussed in clinic at this time.

## 2020-06-18 ENCOUNTER — Ambulatory Visit
Admission: RE | Admit: 2020-06-18 | Discharge: 2020-06-18 | Disposition: A | Discharge status: Home / Self Care | Payer: Medicare Other | Attending: Cardiovascular Disease | Admitting: Cardiovascular Disease

## 2020-06-18 ENCOUNTER — Encounter
Admission: RE | Disposition: A | Discharge status: Home / Self Care | Payer: Self-pay | Source: Home / Self Care | Attending: Cardiovascular Disease

## 2020-06-18 ENCOUNTER — Other Ambulatory Visit: Payer: Self-pay

## 2020-06-18 ENCOUNTER — Encounter: Payer: Self-pay | Admitting: Cardiovascular Disease

## 2020-06-18 DIAGNOSIS — Z888 Allergy status to other drugs, medicaments and biological substances status: Secondary | ICD-10-CM | POA: Diagnosis not present

## 2020-06-18 DIAGNOSIS — I110 Hypertensive heart disease with heart failure: Secondary | ICD-10-CM | POA: Diagnosis not present

## 2020-06-18 DIAGNOSIS — Z955 Presence of coronary angioplasty implant and graft: Secondary | ICD-10-CM | POA: Diagnosis not present

## 2020-06-18 DIAGNOSIS — I509 Heart failure, unspecified: Secondary | ICD-10-CM | POA: Diagnosis not present

## 2020-06-18 DIAGNOSIS — E785 Hyperlipidemia, unspecified: Secondary | ICD-10-CM | POA: Insufficient documentation

## 2020-06-18 DIAGNOSIS — I25110 Atherosclerotic heart disease of native coronary artery with unstable angina pectoris: Secondary | ICD-10-CM | POA: Diagnosis present

## 2020-06-18 DIAGNOSIS — Z91013 Allergy to seafood: Secondary | ICD-10-CM | POA: Diagnosis not present

## 2020-06-18 DIAGNOSIS — Z7982 Long term (current) use of aspirin: Secondary | ICD-10-CM | POA: Diagnosis not present

## 2020-06-18 DIAGNOSIS — Z882 Allergy status to sulfonamides status: Secondary | ICD-10-CM | POA: Insufficient documentation

## 2020-06-18 DIAGNOSIS — I200 Unstable angina: Secondary | ICD-10-CM

## 2020-06-18 DIAGNOSIS — Z79899 Other long term (current) drug therapy: Secondary | ICD-10-CM | POA: Insufficient documentation

## 2020-06-18 DIAGNOSIS — Z881 Allergy status to other antibiotic agents status: Secondary | ICD-10-CM | POA: Diagnosis not present

## 2020-06-18 DIAGNOSIS — I340 Nonrheumatic mitral (valve) insufficiency: Secondary | ICD-10-CM | POA: Diagnosis not present

## 2020-06-18 DIAGNOSIS — R7303 Prediabetes: Secondary | ICD-10-CM | POA: Diagnosis not present

## 2020-06-18 DIAGNOSIS — I25118 Atherosclerotic heart disease of native coronary artery with other forms of angina pectoris: Secondary | ICD-10-CM

## 2020-06-18 DIAGNOSIS — R0600 Dyspnea, unspecified: Secondary | ICD-10-CM | POA: Diagnosis not present

## 2020-06-18 HISTORY — PX: RIGHT/LEFT HEART CATH AND CORONARY ANGIOGRAPHY: CATH118266

## 2020-06-18 SURGERY — RIGHT/LEFT HEART CATH AND CORONARY ANGIOGRAPHY
Anesthesia: Moderate Sedation

## 2020-06-18 MED ORDER — LIDOCAINE HCL (PF) 1 % IJ SOLN
INTRAMUSCULAR | Status: DC | PRN
  Administered 2020-06-18: 5 mL

## 2020-06-18 MED ORDER — VERAPAMIL HCL 2.5 MG/ML IV SOLN
INTRAVENOUS | Status: DC | PRN
  Administered 2020-06-18: 2.5 mg via INTRAVENOUS

## 2020-06-18 MED ORDER — MIDAZOLAM HCL 2 MG/2ML IJ SOLN
INTRAMUSCULAR | Status: AC
  Filled 2020-06-18: qty 2

## 2020-06-18 MED ORDER — SODIUM CHLORIDE 0.9 % IV SOLN
INTRAVENOUS | Status: AC | PRN
  Administered 2020-06-18: 250 mL via INTRAVENOUS

## 2020-06-18 MED ORDER — HEPARIN SODIUM (PORCINE) 1000 UNIT/ML IJ SOLN
INTRAMUSCULAR | Status: DC | PRN
  Administered 2020-06-18: 3000 [IU] via INTRAVENOUS

## 2020-06-18 MED ORDER — ATORVASTATIN CALCIUM 80 MG PO TABS
80.0000 mg | ORAL_TABLET | Freq: Every day | ORAL | Status: DC
Start: 2020-06-18 — End: 2020-08-24

## 2020-06-18 MED ORDER — VERAPAMIL HCL 2.5 MG/ML IV SOLN
INTRAVENOUS | Status: AC
  Filled 2020-06-18: qty 2

## 2020-06-18 MED ORDER — HEPARIN (PORCINE) IN NACL 2000-0.9 UNIT/L-% IV SOLN
INTRAVENOUS | Status: DC | PRN
  Administered 2020-06-18: 1000 mL

## 2020-06-18 MED ORDER — HEPARIN SODIUM (PORCINE) 1000 UNIT/ML IJ SOLN
INTRAMUSCULAR | Status: AC
  Filled 2020-06-18: qty 1

## 2020-06-18 MED ORDER — SODIUM CHLORIDE 0.9 % IV SOLN: INTRAVENOUS | Status: DC

## 2020-06-18 MED ORDER — LIDOCAINE HCL (PF) 1 % IJ SOLN
INTRAMUSCULAR | Status: AC
  Filled 2020-06-18: qty 30

## 2020-06-18 MED ORDER — SODIUM CHLORIDE 0.9 % IV SOLN: 250.0000 mL | INTRAVENOUS | Status: DC | PRN

## 2020-06-18 MED ORDER — FENTANYL CITRATE (PF) 100 MCG/2ML IJ SOLN
INTRAMUSCULAR | Status: AC
  Filled 2020-06-18: qty 2

## 2020-06-18 MED ORDER — SODIUM CHLORIDE 0.9% FLUSH: 3.0000 mL | INTRAVENOUS | Status: DC | PRN

## 2020-06-18 MED ORDER — HEPARIN (PORCINE) IN NACL 1000-0.9 UT/500ML-% IV SOLN
INTRAVENOUS | Status: AC
  Filled 2020-06-18: qty 1000

## 2020-06-18 MED ORDER — IOHEXOL 300 MG/ML  SOLN
INTRAMUSCULAR | Status: DC | PRN
  Administered 2020-06-18: 34 mL

## 2020-06-18 MED ORDER — ASPIRIN 81 MG PO CHEW
81.0000 mg | CHEWABLE_TABLET | ORAL | Status: DC
Start: 2020-06-19 — End: 2020-06-18

## 2020-06-18 MED ORDER — FENTANYL CITRATE (PF) 100 MCG/2ML IJ SOLN
INTRAMUSCULAR | Status: DC | PRN
  Administered 2020-06-18 (×2): 25 ug via INTRAVENOUS

## 2020-06-18 MED ORDER — MIDAZOLAM HCL 2 MG/2ML IJ SOLN
INTRAMUSCULAR | Status: DC | PRN
  Administered 2020-06-18: 1 mg via INTRAVENOUS

## 2020-06-18 SURGICAL SUPPLY — 15 items
CATH 5F 110X4 TIG (CATHETERS) ×2 IMPLANT
CATH BALLN WEDGE 5F 110CM (CATHETERS) ×2 IMPLANT
DEVICE RAD TR BAND REGULAR (VASCULAR PRODUCTS) ×2 IMPLANT
DRAPE BRACHIAL (DRAPES) ×4 IMPLANT
GLIDESHEATH SLEND SS 6F .021 (SHEATH) ×2 IMPLANT
GUIDEWIRE .025 260CM (WIRE) ×2 IMPLANT
GUIDEWIRE INQWIRE 1.5J.035X260 (WIRE) ×1 IMPLANT
INQWIRE 1.5J .035X260CM (WIRE) ×2
KIT SYRINGE INJ CVI SPIKEX1 (MISCELLANEOUS) ×2 IMPLANT
PACK CARDIAC CATH (CUSTOM PROCEDURE TRAY) ×2 IMPLANT
PROTECTION STATION PRESSURIZED (MISCELLANEOUS) ×2
SET ATX SIMPLICITY (MISCELLANEOUS) ×2 IMPLANT
SHEATH GLIDE SLENDER 4/5FR (SHEATH) ×2 IMPLANT
STATION PROTECTION PRESSURIZED (MISCELLANEOUS) ×1 IMPLANT
WIRE HITORQ VERSACORE ST 145CM (WIRE) ×2 IMPLANT

## 2020-06-18 NOTE — Interval H&P Note (Signed)
Cath Lab Visit (complete for each Cath Lab visit)  Clinical Evaluation Leading to the Procedure:   ACS: No.  Non-ACS:    Anginal Classification: CCS III  Anti-ischemic medical therapy: Maximal Therapy (2 or more classes of medications)  Non-Invasive Test Results: No non-invasive testing performed  Prior CABG: No previous CABG      History and Physical Interval Note:  06/18/2020 9:27 AM  Cheryl Briggs  has presented today for surgery, with the diagnosis of RT LT Heart Cath   CAD  Unstable angina.  The various methods of treatment have been discussed with the patient and family. After consideration of risks, benefits and other options for treatment, the patient has consented to  Procedure(s): RIGHT/LEFT HEART CATH AND CORONARY ANGIOGRAPHY (N/A) as a surgical intervention.  The patient's history has been reviewed, patient examined, no change in status, stable for surgery.  I have reviewed the patient's chart and labs.  Questions were answered to the patient's satisfaction.     Kathlyn Sacramento

## 2020-06-19 ENCOUNTER — Other Ambulatory Visit: Payer: Self-pay | Admitting: Emergency Medicine

## 2020-06-19 DIAGNOSIS — Z1231 Encounter for screening mammogram for malignant neoplasm of breast: Secondary | ICD-10-CM

## 2020-06-25 ENCOUNTER — Ambulatory Visit
Admission: RE | Admit: 2020-06-25 | Discharge: 2020-06-25 | Disposition: A | Discharge status: Home / Self Care | Payer: Medicare Other | Source: Ambulatory Visit | Attending: Emergency Medicine | Admitting: Emergency Medicine

## 2020-06-25 ENCOUNTER — Other Ambulatory Visit: Payer: Self-pay

## 2020-06-25 DIAGNOSIS — Z1231 Encounter for screening mammogram for malignant neoplasm of breast: Secondary | ICD-10-CM | POA: Diagnosis present

## 2020-07-22 ENCOUNTER — Other Ambulatory Visit: Payer: Self-pay

## 2020-07-22 ENCOUNTER — Ambulatory Visit (INDEPENDENT_AMBULATORY_CARE_PROVIDER_SITE_OTHER): Payer: Medicare Other

## 2020-07-22 DIAGNOSIS — R011 Cardiac murmur, unspecified: Secondary | ICD-10-CM

## 2020-07-22 LAB — ECHOCARDIOGRAM COMPLETE
AR max vel: 1.8 cm2
AV Area VTI: 1.98 cm2
AV Area mean vel: 1.77 cm2
AV Mean grad: 6.5 mmHg
AV Peak grad: 11.5 mmHg
Ao pk vel: 1.7 m/s
Area-P 1/2: 2.7 cm2
MV VTI: 2.31 cm2
S' Lateral: 2.4 cm

## 2020-07-30 ENCOUNTER — Telehealth: Payer: Self-pay | Admitting: *Deleted

## 2020-07-30 NOTE — Telephone Encounter (Signed)
Left voicemail message to call back for review of results.  

## 2020-07-30 NOTE — Telephone Encounter (Signed)
-----   Message from Arvil Chaco, PA-C sent at 07/29/2020 10:55 PM EDT ----- Kermit Balo news! Echo shows --Normal pump function. Heart walls moving normally. --Slightly stiff heart - as seen in the past. We will continue to control BP. --Calcified aortic valve, which is not concerning with valve still functioning appropriately.  Very reassuring echo. Will review echo and her cath results at her upcoming visit.

## 2020-08-03 ENCOUNTER — Ambulatory Visit (INDEPENDENT_AMBULATORY_CARE_PROVIDER_SITE_OTHER): Payer: Medicare Other | Admitting: Physician Assistant

## 2020-08-03 ENCOUNTER — Encounter: Payer: Self-pay | Admitting: Physician Assistant

## 2020-08-03 ENCOUNTER — Other Ambulatory Visit: Payer: Self-pay

## 2020-08-03 VITALS — BP 100/60 | HR 75 | Ht 60.5 in | Wt 135.0 lb

## 2020-08-03 DIAGNOSIS — I4891 Unspecified atrial fibrillation: Secondary | ICD-10-CM

## 2020-08-03 DIAGNOSIS — E785 Hyperlipidemia, unspecified: Secondary | ICD-10-CM

## 2020-08-03 DIAGNOSIS — I252 Old myocardial infarction: Secondary | ICD-10-CM

## 2020-08-03 DIAGNOSIS — I25110 Atherosclerotic heart disease of native coronary artery with unstable angina pectoris: Secondary | ICD-10-CM

## 2020-08-03 DIAGNOSIS — C9110 Chronic lymphocytic leukemia of B-cell type not having achieved remission: Secondary | ICD-10-CM

## 2020-08-03 DIAGNOSIS — I493 Ventricular premature depolarization: Secondary | ICD-10-CM

## 2020-08-03 DIAGNOSIS — R0609 Other forms of dyspnea: Secondary | ICD-10-CM

## 2020-08-03 DIAGNOSIS — I10 Essential (primary) hypertension: Secondary | ICD-10-CM

## 2020-08-03 DIAGNOSIS — Z8679 Personal history of other diseases of the circulatory system: Secondary | ICD-10-CM

## 2020-08-03 DIAGNOSIS — R7309 Other abnormal glucose: Secondary | ICD-10-CM

## 2020-08-03 DIAGNOSIS — I5032 Chronic diastolic (congestive) heart failure: Secondary | ICD-10-CM

## 2020-08-03 MED ORDER — CLOPIDOGREL BISULFATE 75 MG PO TABS: 75.0000 mg | ORAL_TABLET | Freq: Every day | ORAL | 5 refills | Status: DC

## 2020-08-03 NOTE — Patient Instructions (Signed)
Medication Instructions:    STOP taking Brilinta.  2.    START taking Plavix 300 mg on the first day, and then 75 MG once a day following.   *If you need a refill on your cardiac medications before your next appointment, please call your pharmacy*   Lab Work: None ordered If you have labs (blood work) drawn today and your tests are completely normal, you will receive your results only by: [place] (if you have MyChart) OR A paper copy in the mail If you have any lab test that is abnormal or we need to change your treatment, we will call you to review the results.   Testing/Procedures: None ordered   Follow-Up: At [facility], you and your health needs are our priority.  As part of our continuing mission to provide you with exceptional heart care, we have created designated Provider Care Teams.  These Care Teams include your primary Cardiologist (physician) and Advanced Practice Providers (APPs -  Physician Assistants and Nurse Practitioners) who all work together to provide you with the care you need, when you need it.  We recommend signing up for the patient portal called "MyChart".  Sign up information is provided on this After Visit Summary.  MyChart is used to connect with patients for Virtual Visits (Telemedicine).  Patients are able to view lab/test results, encounter notes, upcoming appointments, etc.  Non-urgent messages can be sent to your provider as well.   To learn more about what you can do with MyChart, go to [email or web address].    Your next appointment:   Follow up before your trip in October   The format for your next appointment:   In Person  Provider:   You may see Kathlyn Sacramento, MD or one of the following Advanced Practice Providers on your designated Care Team:   Murray Hodgkins, NP Christell Faith, PA-C Marrianne Mood, PA-C Cadence Kathlen Mody, [place]   Other Instructions

## 2020-08-03 NOTE — Telephone Encounter (Signed)
Patient here for appointment with provider and is aware of results. No need to call back as they are going to review during appointment.

## 2020-08-03 NOTE — Telephone Encounter (Signed)
Left voicemail message to call back for review of results.  

## 2020-08-03 NOTE — Progress Notes (Signed)
Office Visit   Patient Name: Cheryl Briggs Date of Encounter: 08/03/2020  PCP:  Vidal Schwalbe, [place]  Cardiologist:  Kathlyn Sacramento, MD  Advanced Practice Provider:  Arvil Chaco, PA-C Electrophysiologist:  None   Chief Complaint    Chief Complaint  Patient presents with   Follow-up    F/U after cardiac testing-Patient states she is still getting short of breath.     75 y.o. female with a hx of CLL, arthritis, secondhand smoke exposure, non-STEMI with subsequent LHC 08/2019 and significant 3v CAD and complicated/staged PCI (0000000 dRCA, 08/19/19 dLCx), PCI on 8/6 complicated by no reflow and bradycardia/hypotension/atrial fibrillation with RVR, post procedure right forearm hematoma, 2/15/222 NSTEMI and PCI to [facility]. HFpEF, hypertension, hyperlipidemia, and here for follow-up after [facility] with ongoing SOB.   Past Medical History    Past Medical History:  Diagnosis Date   Allergy    Arthritis    Cancer ([place])    CLL   Past Surgical History:  Procedure Laterality Date   BACK SURGERY     BLADDER REPAIR     CARDIAC CATHETERIZATION     carpel tunnell Bilateral    CHOLECYSTECTOMY     COLONOSCOPY  2015   CORONARY STENT INTERVENTION N/A 08/16/2019   Procedure: CORONARY STENT INTERVENTION;  Surgeon: Nelva Bush, MD;  Location: [place] [facility];  Service: Cardiovascular;  Laterality: N/A;   CORONARY STENT INTERVENTION Left 08/19/2019   Procedure: CORONARY STENT INTERVENTION;  Surgeon: Wellington Hampshire, MD;  Location: [place] [facility];  Service: Cardiovascular;  Laterality: Left;   CORONARY STENT INTERVENTION N/A 02/25/2020   Procedure: CORONARY STENT INTERVENTION;  Surgeon: Nelva Bush, MD;  Location: [place] [facility];  Service: Cardiovascular;  Laterality: N/A;   LEFT HEART CATH AND CORONARY ANGIOGRAPHY N/A 08/16/2019   Procedure: LEFT HEART CATH AND CORONARY ANGIOGRAPHY;  Surgeon: Nelva Bush, MD;  Location: [place] [facility];  Service: Cardiovascular;  Laterality: N/A;   LEFT HEART CATH AND CORONARY ANGIOGRAPHY N/A 02/25/2020   Procedure: LEFT HEART CATH AND CORONARY ANGIOGRAPHY;  Surgeon: Nelva Bush, MD;  Location: [place] [facility];  Service: Cardiovascular;  Laterality: N/A;   RIGHT/LEFT HEART CATH AND CORONARY ANGIOGRAPHY N/A 06/18/2020   Procedure: RIGHT/LEFT HEART CATH AND CORONARY ANGIOGRAPHY;  Surgeon: Wellington Hampshire, MD;  Location: [place] [facility];  Service: Cardiovascular;  Laterality: N/A;   SHOULDER SURGERY Bilateral    UPPER EXTREMITY ANGIOGRAPHY Right 08/21/2019   Procedure: UPPER EXTREMITY ANGIOGRAPH;  Surgeon: Algernon Huxley, MD;  Location: [place] [facility];  Service: Cardiovascular;  Laterality: Right;    Allergies  Allergies  Allergen Reactions   Corticosteroids     Headache   Other Nausea And Vomiting    Seafood - cramping   Sulfa Antibiotics Anaphylaxis   Clarithromycin     Rectal bleeding    Cymbalta [Duloxetine Hcl] Diarrhea    Abdominal cramping    Prednisone Other (See Comments)    Color changes in face, headache and difficulty walking in high doses    History of Present Illness    Cheryl Briggs is a 75 y.o. female with PMH as above. She has reported exposure to secondhand smoke for approximately 20 years via her husband.   Before her 08/2019 NSTEMI, she felt 10/10 chest pain and pressure in her center of her chest with associated shortness of breath, diaphoresis, nausea, emesis x1, and dizziness.  She had decreased exercise  tolerance. LHC 08/16/2019 showed severe multivessel CAD with moderately elevated LV filling pressures.  Complex PCI was performed to the dRCA, rPDA, and rPL using DES extending from the dRCA into the rPDA, as well as angioplasty performed of the ostial rPL branch.  Distal RCA PCI was complicated by no reflow in the distal branches.  Final angiogram showed 0% stenosis in the distal RCA/RPDA and 10% residual stenosis of the ostial  RPL. It was thought that the culprit of her non-ST elevation myocardial infarction was the thrombotic lesion of the distal RCA.    She had transient bradycardia and hypotension with development of atrial fibrillation, thought 2/2 complicated PCI and mediated by acute ischemia, as well as administration of atropine and norepinephrine. She was started on amiodarone (afib) and lasix (elevated filling pressures).  She underwent repeat intervention 08/19/2019 with PCI to distal left circumflex and elevated LVEDP.Post procedure, she experienced a right forearm hematoma with recommendation for conservative management.  After discharge, she was seen in clinic several times and noted she was overall doing well; however, fatigue and dyspnea did not improve until after starting cardiac rehab. On  01/24/2020, she noted significant increase in exercise tolerance. She continued to try and remain active.She planned to walk on the treadmill at the senior center. She could walk as quickly as 3 miles per hour. She was taking tramadol and gabapentin for joint pain. She had atypical sharp chest pain, though improved in frequency. It occured mainly in the morning at rest and with exertion without other clear triggers.    On 02/25/20, she presented to [facility] with worsening angina x2 weeks that as now present at rest. The CP had initially been worse when walking on the treadmill and would feel like left sided non radiating CP then resolve with rest.  It was described as similar to her prior heart attack. Tn mildly elevated.  Cath showed multivessel CAD with 80%D2s, 60%p-m LCx, chronic total occlusion of OM1, 99% mRCA stenosis and 50% rPDA. Nl LVEDP. PCI performed to [facility]. Plan was for ASA and Brilinta for 89mo Repeat echo LVEF 55-60%, G1DD, mildly reduced RV function.   She was seen in clinic after her NSTEMI with episodes of CP and pressure reported and described as center to L sided and constant pain/pressure/ache, lasting 529m  24h. Pain was not made worse with exertion and rated up to 5/10 in severity. The week leading up to her visit, she had CP almost every day. She had not taken SL nitro. She reported feeling very stressed, as multiple people had passed away in the last 6 months. On the way into her visit, she felt SOB. Prior to admission, she was walking 1-2 miles daily. She was walking 1/2 mile occasionally throughout the week. She was going to the senior center instead of cardiac rehab, due to the hour commute. Home BP 120/70s. HS Tn unremarkable. Imdur increased to '30mg'$  daily and losartan held to prevent hypotension. Case discussed with her primary cardiologist with recommendation for repeat cardiac catheterization if ongoing chest pain after increasing Imdur to 30 mg daily.  She was seen in clinic 04/07/2020 by CaLaurann MontanaNP. She was noted to have PVCs on EKG with 14-day ZIO monitor performed to further evaluate the burden of this ectopy.  For ongoing CP, she was increased to Imdur 60 mg daily.  04/2020 Zio monitor showed NSR with I run of ventricular tachycardia, lasting 5 beats with a maximum rate of 133 bpm.  8 SVT runs occurred,  the fastest of which lasted 5 beats with maximum rate of 197 bpm, the longest lasting 23.7 seconds with an average rate of 112 bpm.  She had rare PACs and PVCs with a burden less than 1%..  Seen 05/2020 and scheduled for LHC due to recurrent sx. Losartan was restarted and cath/echo ordered.  06/18/20 LHC was with widely patent Lcx and RCA stents without significant restenosis. Ost RCA to pRCA 30%s. RPDA 50%s, RPAV 10%s.  The p-mLCx had stable moderate dz at 60% with chronically occluded OM1 with L-L collaterals. There was significant dz in the second diag at 80%s, unchanged. The branch was 58m in diameter and dz extended back to the ostium. RHC was with nl filling pressure, pulmonary pressure, and CO.   Echo showed EF 55-60%, NRWMA, G1DD, calcified aortic valve without stenosis.  Today, she  returns to clinic and is doing well since her catheterization. She continues to walk and has now increased back on the treadmill to walking 3 miles per hour. She has ongoing SOB with recommendation to switch from Brilinta to Plavix as below. BP log reviewed with SBP 100-120 and mainly BP 110s. She reports ongoing sx of feeling as if she is choking /brief palpitations that happen once a day or every couple of days and only for a few seconds. No presyncope or syncope. She denied any s/sx of volume overload, as well as any s/sx of bleeding.  [place] nervous, given her recent CP, BP, and known dz as outlined above.    Home Medications    Prior to Admission medications   Medication Sig Start Date End Date Taking? Authorizing Provider  aspirin EC 81 MG EC tablet Take 1 tablet (81 mg total) by mouth daily. Swallow whole. 08/19/19  Yes AJennye Boroughs MD  atorvastatin (LIPITOR) 80 MG tablet Take 1 tablet (80 mg total) by mouth daily. 08/19/19  Yes AJennye Boroughs MD  Bioflavonoid Products (ESTER C PO) Take 1,000 mg by mouth daily.   Yes [provider]  calcium citrate-vitamin D (CITRACAL+D) 315-200 MG-UNIT tablet Take 1 tablet by mouth 2 (two) times daily.   Yes [provider]  Chlorpheniramine Maleate (CHLOR-TABLETS PO) Take 1 tablet by mouth as needed.    Yes [provider]  Cholecalciferol (VITAMIN D-1000 MAX ST) 25 MCG (1000 UT) tablet Take 3,000 Units by mouth daily.   Yes [provider]  desonide (DESOWEN) 0.05 % ointment desonide 0.05 % topical ointment 02/21/17  Yes [provider]  gabapentin (NEURONTIN) 300 MG capsule Take 300 mg by mouth 3 (three) times daily. 08/26/19  Yes [provider]  metoprolol succinate (TOPROL-XL) 50 MG 24 hr tablet Take 1.5 tablets (75 mg total) by mouth daily. 08/20/19  Yes AJennye Boroughs MD  Multiple Vitamins-Minerals (MULTIVITAMIN WITH  MINERALS) tablet Take 1 tablet by mouth daily.   Yes [provider]  Omeprazole (PRILOSEC PO) Take 1 tablet by mouth daily.    Yes [provider]  ticagrelor (BRILINTA) 90 MG TABS tablet Take 1 tablet (90 mg total) by mouth 2 (two) times daily. 08/19/19 08/18/20 Yes AJennye Boroughs MD    Review of Systems    She has an ongoing feeling of choking that occurs at least daily to every couple of days and brief palpitations for a few seconds.SOB reported. At times, BP low. No pnd, orthopnea, n, v, syncope, weight gain, or early satiety. All  other systems reviewed and are otherwise negative except as noted above.  Physical Exam    VS:  BP 100/60 (BP Location: Left Arm, Patient Position: Sitting, Cuff Size: Normal)   Pulse 75   Ht 5' 0.5" (1.537 m)   Wt 135 lb (61.2 kg)   SpO2 96%   BMI 25.93 kg/m  , BMI Body mass index is 25.93 kg/m. GEN: Well nourished, well developed, in no acute distress. Joined by her daughter. HEENT: normal. Neck: Supple, no JVD, carotid bruits, or masses. Cardiac: RRR, 1/6 systolic murmur in right upper sternal border, rubs, or gallops. No clubbing, cyanosis, edema. Radial pulses 1+ R and 2+L. L radial arteriotomy site without swelling, edema, hematoma.    Respiratory:  Respirations regular and unlabored, clear to auscultation bilaterally. GI: Soft, nontender, nondistended, BS + x 4. MS: no deformity or atrophy. Skin: warm and dry, no rash. Neuro:  Strength and sensation are intact. Psych: Normal affect.  Accessory Clinical Findings    ECG personally reviewed by me today -NSR, poor R wave progression III, aVF, V1 - V6, Resolution of previous PVCs  - no acute changes.  VITALS Reviewed today   Temp Readings from Last 3 Encounters:  06/18/20 97.7 F (36.5 C) (Axillary)  05/11/20 (!) 97.4 F (36.3 C)  02/26/20 98.8 F (37.1 C) (Oral)   BP Readings from Last 3 Encounters:  06/18/20 109/65  06/05/20 120/70  05/11/20 134/73   Pulse Readings  from Last 3 Encounters:  06/18/20 70  06/05/20 74  05/11/20 91    Wt Readings from Last 3 Encounters:  06/05/20 135 lb (61.2 kg)  05/11/20 136 lb 3.9 oz (61.8 kg)  04/07/20 136 lb (61.7 kg)     LABS  reviewed today   Lab Results  Component Value Date   WBC 11.9 (H) 06/05/2020   HGB 14.2 06/05/2020   HCT 41.7 06/05/2020   MCV 97 06/05/2020   PLT 272 06/05/2020   Lab Results  Component Value Date   CREATININE 0.73 06/05/2020   BUN 7 (L) 06/05/2020   NA 137 06/05/2020   K 4.4 06/05/2020   CL 101 06/05/2020   CO2 21 06/05/2020   Lab Results  Component Value Date   ALT 29 10/06/2017   AST 36 10/06/2017   ALKPHOS 75 10/06/2017   BILITOT 0.6 10/06/2017   Lab Results  Component Value Date   CHOL 139 09/24/2019   HDL 61 09/24/2019   LDLCALC 54 09/24/2019   LDLDIRECT 51 09/24/2019   TRIG 138 09/24/2019   CHOLHDL 2.3 09/24/2019    Lab Results  Component Value Date   HGBA1C 6.0 (H) 08/15/2019   Lab Results  Component Value Date   TSH 1.114 02/25/2020     STUDIES/PROCEDURES reviewed today   Echo 07/22/20  1. Left ventricular ejection fraction, by estimation, is 55 to 60%. The  left ventricle has normal function. The left ventricle has no regional  wall motion abnormalities. Left ventricular diastolic parameters are  consistent with Grade I diastolic  dysfunction (impaired relaxation).   2. Right ventricular systolic function is normal. The right ventricular  size is normal.   3. The mitral valve is normal in structure. No evidence of mitral valve  regurgitation.   4. The aortic valve is calcified. Aortic valve regurgitation is not  visualized. Mild to moderate aortic valve sclerosis/calcification is  present, without any evidence of aortic stenosis.   5. The inferior vena cava is normal in size with greater than 50%  respiratory variability, suggesting right atrial pressure of 3 mmHg.   [facility] 06/18/20 2nd Diag lesion is 80% stenosed. 1st Mrg lesion is 100%  stenosed. Prox Cx to Mid Cx lesion is 60% stenosed. Non-stenotic Dist Cx lesion was previously treated. RPAV lesion is 10% stenosed. Non-stenotic Dist RCA lesion with 0% stenosed side branch in RPDA was previously treated. RPDA lesion is 50% stenosed. Ost RCA to Prox RCA lesion is 30% stenosed. Non-stenotic Mid RCA lesion was previously treated. 1.  Widely patent left circumflex and RCA stents with no significant restenosis.  Stable moderate disease in proximal left circumflex with chronically occluded OM1 with collaterals as well as significant disease in the second diagonal which is unchanged.  This branch is about 2 mm in diameter and the disease extends all the way back to the ostium. 2.  Unremarkable right heart catheterization with normal filling pressures, normal pulmonary pressure normal cardiac output. Coronary Diagrams   Diagnostic Dominance: Right        Recommendations: Continue medical therapy. I wonder if some of her dyspnea might be related to ticagrelor.  Zio 04/07/20 Patch Wear Time:  13 days and 17 hours Patient had a min HR of 54 bpm, max HR of 197 bpm, and avg HR of 76 bpm. Predominant underlying rhythm was Sinus Rhythm. 1 run of Ventricular Tachycardia occurred lasting 5 beats with a max rate of 133 bpm (avg 110 bpm). 8 Supraventricular Tachycardia runs occurred, the run with the fastest interval lasting 5 beats with a max rate of 197 bpm, the longest lasting 23.7 secs with an avg rate of 112 bpm. Rare PACs and rare PVCs with a burden of less than 1%.  Echo 02/2020  1. Left ventricular ejection fraction, by estimation, is 55 to 60%. The  left ventricle has normal function. Left ventricular endocardial border  not optimally defined to evaluate regional wall motion. There is mild left  ventricular hypertrophy. Left  ventricular diastolic parameters are consistent with Grade I diastolic  dysfunction (impaired relaxation).   2. Right ventricular systolic function  is mildly reduced. The right  ventricular size is normal. Mildly increased right ventricular wall  thickness. There is normal pulmonary artery systolic pressure.   3. The mitral valve is normal in structure. Mild mitral valve  regurgitation. No evidence of mitral stenosis.   4. The aortic valve is tricuspid. There is mild calcification of the  aortic valve. There is mild thickening of the aortic valve. Aortic valve  regurgitation is not visualized. Mild to moderate aortic valve  sclerosis/calcification is present, without any  evidence of aortic stenosis.   5. The inferior vena cava is normal in size with <50% respiratory  variability, suggesting right atrial pressure of 8 mmHg.    Cardiac cath 02/2020 Conclusions: Multivessel coronary artery disease, including 80% D2 stenosis, 60% prox/mid LCx disease, chronic total occlusion of OM1, 99% mid RCA stenosis, and 50% rPDA lesion. Widely patent LCx and distal RCA/PDA stents. Normal left ventricular filling pressure. Successful PCI to mid RCA using Resolute Onyx 2.75 x 15 mm drug-eluting stent with 0% residual stenosis and TIMI-3 flow.   Recommendations: Continue dual antiplatelet therapy with aspirin and ticagrelor x 12 months. Aggressive secondary prevention. Obtain echocardiogram. Anticipate discharge home tomorrow if no post-cath complications.   Nelva Bush, MD [facility]     Coronary Diagrams     Diagnostic Dominance: Right      Intervention            2D echo 08/15/2019:  1. Left ventricular ejection fraction, by estimation, is 55 to 60%. The  left ventricle has normal function. The left ventricle has no regional  wall motion abnormalities. There is mild left ventricular hypertrophy.  Left ventricular diastolic parameters  were normal.   2. Right ventricular systolic function is normal. The right ventricular  size is normal. There is normal pulmonary artery systolic pressure.   3. The mitral valve is normal  in structure. Mild mitral valve  regurgitation. No evidence of mitral stenosis.   4. The aortic valve is abnormal. Aortic valve regurgitation is not  visualized. Mild to moderate aortic valve sclerosis/calcification is  present, without any evidence of aortic stenosis.  __________   LHC 08/16/2019: A drug-eluting stent was successfully placed using a STENT SYNERGY DES 2.25X20. A stent was successfully placed. Conclusions: Severe multivessel coronary artery disease, including 80% D2 lesion, sequential 60% proximal and 80% distal LCx stenoses, chronic total occlusion of OM1, 50-60% mid RCA stenosis, and thrombotic 99% distal RCA lesion extending into the rPDA. Moderately elevated left ventricular filling pressure. Complex PCI to distal RCA, rPDA, and rPLA using Synergy 2.25 x 20 mm drug-eluting stent extending from the distal RCA into the rPDA, as well as angioplasty of the ostial rPL branch.  Intervention was complicated by no-reflow with transient hypotension/bradycardia and subsequent development of atrial fibrillation with rapid ventricular response.  Final angiogram shows 0% residual stenosis in the distal RCA/rPDA and 10% residual stenosis at ostial rPL with TIMI-2 flow. Recommendations: Transfer to ICU for aggressive medical therapy and post-PCI monitoring. Continue tirofiban infusion for 18 hours. Dual antiplatelet therapy with aspirin and ticagrelor for at least 12 months. Titrate nitroglycerine infusion for relief of chest pain. Continue amiodarone infusion for rate control and hopefully conversion to sinus rhythm.  If patient remains in atrial fibrillation tomorrow, IV heparin should be started (will defer heparin at this time given tirofiban infusion). Aggressive secondary prevention. Anticipate relook catheterization on Monday with PCI to the LCx.  I favor medical therapy for D2, given relatively small vessel size and need to stent back to the LAD, were intervention  undertaken. __________   LHC 08/19/2019: 2nd Diag lesion is 80% stenosed. 1st Mrg lesion is 100% stenosed. Prox Cx to Mid Cx lesion is 60% stenosed. Dist Cx lesion is 80% stenosed. Mid RCA lesion is 55% stenosed. RPAV lesion is 10% stenosed. Balloon angioplasty was performed. Previously placed Dist RCA drug-eluting stents are widely patent with 0% stenosed side branch in RPDA. Post intervention, there is a 0% residual stenosis. A drug-eluting stent was successfully placed using a STENT RESOLUTE ONYX 2.75X18.   1.  Widely patent RCA stent with normal flow distally.  Unchanged left coronary artery disease. 2.  Mildly elevated left ventricular end-diastolic pressure at 14 mmHg. 3.  Successful direct stenting of the distal left circumflex. Recommendations: Continue dual antiplatelet therapy for 1 year. Aggressive treatment of risk factors. If the patient remains stable, she can potentially be discharged home in the afternoon.  Assessment & Plan   Coronary artery disease  S/p PCI 08/2019, 02/2020 History of NSTEMI --Continue to note sx. Returns after repeat [facility] for DOE and CP. LHC showed patent DES to dRCA, mRCA, and dLCx, as well as stable dz as detailed above. L radial arteriotomy site without hematoma on exam today. RHC with nl pressures. Echo with EF nl, NRWMA, and no significant valvular dz. EKG today stable from previous and with resolution of previous PVCs. Euvolemic on exam. Continue Toprol and ASA. We  will discontinue Brilinta, as this could be contributing to her SOB. Start Plavix with reload dose of Plavix '300mg'$  x1 then Plavix '75mg'$  daily thereafter. Continue low dose Losartan 12.'5mg'$  daily as soft BP allows.  Continue current Imdur 60 mg daily. Continue secondary prevention of risk factors, including statin therapy.  History of pSVT, NSVT. H/o atrial fibrillation with RVR s/p PCI -- Reports racing heart rate with repeat Zio monitor performed as above and with paroxysmal SVT and 1  run of ventricular tachycardia but otherwise unremarkable.  H/o A. fib with RVR, which developed during complicated PCI and thought likely mediated by acute ischemia and administration of atropine and norepinephrine.  She was discharged on oral amiodarone, since discontinued due maintaining NSR.  She is still NSR today with previous PVCs resolved on EKG.  Continue Toprol for rate control.  No recommendation for anticoagulation as noted in past and given her likely etiology of her atrial fibrillation was 2/2 acute ischemia during catheterization.    G1DD --Reports dyspnea/shortness of breath with episodes of chest pain.  Most recent echo as above with EF normal, G1DD. RHC with nl pressures.  Euvolemic and well compensated on exam. BP soft. No indication for loop diuretic at this time.  She continues on a low-salt diet and total fluid under 2 L as recommended.  Continue current Toprol and low dose losartan 12.5 mg. Continue diet and exercise as discussed.  HLD, LDL goal <70 --LDL 54 and at goal of below 70 per most recent labs.  Continue high intensity atorvastatin 80 mg daily for target LDL less than 70.    Hypertension, goal BP <130/80 -BP well controlled.  No medication changes.  Continue current Toprol, losartan 12.5 mg, Imdur. Continue to monitor BP at home with goal BP 130/80 or lower.  Elevated A1C -Previous 08/2019 A1c elevated at 6.0.  Recommend glycemic control and management per PCP.  Continue ARB.  Consider addition of Farxiga at RTC if BP /renal function allows.  Medication changes:. --Stop Brilinta as may be etiology of SOB (if so, update her allergy list). --Load Plavix '300mg'$  x1 and Plavix '75mg'$  daily thereafter.  Labs ordered: None. Studies / Imaging ordered: None.  Future considerations: ?[place]. Disposition: RTC in October and before [place] trip!  Arvil Chaco, PA-C 08/03/2020

## 2020-08-18 ENCOUNTER — Telehealth: Payer: Self-pay | Admitting: Physician Assistant

## 2020-08-18 NOTE — Telephone Encounter (Signed)
Was able to reach out to Cheryl Briggs regarding her hair loss, possibly related to her Lipitor. This RN had reached out to our pharmacy teach, and Cheryl Briggs, [name] advised  I could only find one case report of hair falling out on Lipitor.  Hair grew back after med was d/c.  However, patient has been on atorvastatin for a year so it would be surprising to develop this side effect now.  Pt reports hair loss has been approx 6 months, has been on Lipitor for approx 1 year. She is currently taking OTC Biotin supplements as advised by another RN for hair loss/growth and switched to satin pillow case to help reduce hair breakage.   Pt reports she read in her [place] that hair loss can be a side affect to Lipitor. Reports steady hair loss, continues to take her daily 80 mg Lipitor as prescribe.   Advised will reach out to her primary cardiologist for advice to see what they advise on the correlation of hair loss and Lipitor, to see if they would like to reduce dosage or d/c med and possible try another cholesterol medication.   Cheryl Briggs is very thankful for the return call, will await response from primary cardiologist, until then will continue Lipitor 80 mg daily.

## 2020-08-18 NOTE — Telephone Encounter (Signed)
I could only find one case report of hair falling out on Lipitor.  Hair grew back after med was d/c.  However, patient has been on atorvastatin for a year so it would be surprising to develop this side effect now.

## 2020-08-18 NOTE — Telephone Encounter (Signed)
Please call to discuss Lipitor. Patient states her hair is falling out and she thinks it is caused by the Lipitor

## 2020-08-21 NOTE — Telephone Encounter (Signed)
Attempted to reach pt via cell phone, no answer, cannot leave message, VM not set up. Called home phone, left message on answering machine to call the office back for mediation advice.

## 2020-08-21 NOTE — Telephone Encounter (Signed)
I am not aware that Lipitor causes hair loss but we could switch her to rosuvastatin 20 mg daily.

## 2020-08-24 MED ORDER — ROSUVASTATIN CALCIUM 20 MG PO TABS: 20.0000 mg | ORAL_TABLET | Freq: Every day | ORAL | 1 refills | Status: DC

## 2020-08-24 NOTE — Addendum Note (Signed)
Addended by: Lamar Laundry on: 08/24/2020 10:12 AM   Modules accepted: Orders

## 2020-08-24 NOTE — Telephone Encounter (Signed)
Patient made aware of Dr. Tyrell Antonio response and recommendation. She is agreeable with the change. Rx for rosuvastatin 20 daily has been sent to the patients pharmacy.  Patient voiced appreciation for the assistance.

## 2020-08-24 NOTE — Telephone Encounter (Signed)
Patient returning call.

## 2020-09-09 ENCOUNTER — Other Ambulatory Visit: Payer: Self-pay | Admitting: Cardiovascular Disease

## 2020-10-15 ENCOUNTER — Ambulatory Visit: Payer: Medicare Other | Admitting: Cardiovascular Disease

## 2020-10-15 ENCOUNTER — Ambulatory Visit: Payer: Medicare Other | Admitting: Physician Assistant

## 2020-10-16 ENCOUNTER — Ambulatory Visit (INDEPENDENT_AMBULATORY_CARE_PROVIDER_SITE_OTHER): Payer: Medicare Other | Admitting: Physician Assistant

## 2020-10-16 ENCOUNTER — Encounter: Payer: Self-pay | Admitting: Physician Assistant

## 2020-10-16 ENCOUNTER — Other Ambulatory Visit: Payer: Self-pay

## 2020-10-16 VITALS — BP 120/70 | HR 74 | Ht 61.5 in | Wt 135.0 lb

## 2020-10-16 DIAGNOSIS — I5189 Other ill-defined heart diseases: Secondary | ICD-10-CM

## 2020-10-16 DIAGNOSIS — E785 Hyperlipidemia, unspecified: Secondary | ICD-10-CM

## 2020-10-16 DIAGNOSIS — R0789 Other chest pain: Secondary | ICD-10-CM

## 2020-10-16 DIAGNOSIS — I214 Non-ST elevation (NSTEMI) myocardial infarction: Secondary | ICD-10-CM

## 2020-10-16 DIAGNOSIS — I2510 Atherosclerotic heart disease of native coronary artery without angina pectoris: Secondary | ICD-10-CM | POA: Diagnosis not present

## 2020-10-16 DIAGNOSIS — I10 Essential (primary) hypertension: Secondary | ICD-10-CM

## 2020-10-16 DIAGNOSIS — R7309 Other abnormal glucose: Secondary | ICD-10-CM

## 2020-10-16 DIAGNOSIS — I25119 Atherosclerotic heart disease of native coronary artery with unspecified angina pectoris: Secondary | ICD-10-CM

## 2020-10-16 DIAGNOSIS — I252 Old myocardial infarction: Secondary | ICD-10-CM

## 2020-10-16 DIAGNOSIS — C9110 Chronic lymphocytic leukemia of B-cell type not having achieved remission: Secondary | ICD-10-CM

## 2020-10-16 DIAGNOSIS — Z8679 Personal history of other diseases of the circulatory system: Secondary | ICD-10-CM

## 2020-10-16 DIAGNOSIS — I471 Supraventricular tachycardia: Secondary | ICD-10-CM

## 2020-10-16 DIAGNOSIS — I200 Unstable angina: Secondary | ICD-10-CM

## 2020-10-16 MED ORDER — CARVEDILOL 12.5 MG PO TABS: 12.5000 mg | ORAL_TABLET | Freq: Two times a day (BID) | ORAL | 6 refills | Status: DC

## 2020-10-16 NOTE — Patient Instructions (Addendum)
Medication Instructions:  - Your physician has recommended you make the following change in your medication:   1) STOP toprol (metoprolol succinate)  2) START coreg (carvedilol) 12.5 mg- take 1 tablet by mouth TWICE daily   - After 2 weeks on the carvedilol, please call the office and let us know how you are tolerating this medication  *If you need a refill on your cardiac medications before your next appointment, please call your pharmacy*   Lab Work: - none ordered  If you have labs (blood work) drawn today and your tests are completely normal, you will receive your results only by: MyChart Message (if you have MyChart) OR A paper copy in the mail If you have any lab test that is abnormal or we need to change your treatment, we will call you to review the results.   Testing/Procedures: - none ordered   Follow-Up: At [facility], you and your health needs are our priority.  As part of our continuing mission to provide you with exceptional heart care, we have created designated Provider Care Teams.  These Care Teams include your primary Cardiologist (physician) and Advanced Practice Providers (APPs -  Physician Assistants and Nurse Practitioners) who all work together to provide you with the care you need, when you need it.  We recommend signing up for the patient portal called "MyChart".  Sign up information is provided on this After Visit Summary.  MyChart is used to connect with patients for Virtual Visits (Telemedicine).  Patients are able to view lab/test results, encounter notes, upcoming appointments, etc.  Non-urgent messages can be sent to your provider as well.   To learn more about what you can do with MyChart, go to [email or web address].    Your next appointment:   3 month(s)  The format for your next appointment:   In Person  Provider:   You may see Kathlyn Sacramento, MD or one of the following Advanced Practice Providers on your designated Care Team:    Murray Hodgkins, NP Christell Faith, PA-C Marrianne Mood, PA-C Cadence Kathlen Mody, [place]   Other Instructions  COREG (Carvedilol) Tablets What is this medication? CARVEDILOL (KAR ve dil ol) treats high blood pressure and heart failure. It may also be used to prevent further damage after a heart attack. It works by lowering your blood pressure and heart rate, making it easier for your heart to pump blood to the rest of your body. It belongs to a group of medications called beta blockers. This medicine may be used for other purposes; ask your health care provider or pharmacist if you have questions. COMMON BRAND NAME(S): Coreg What should I tell my care team before I take this medication? They need to know if you have any of these conditions: Circulation problems Diabetes History of heart attack or heart disease Liver disease Lung or breathing disease, like asthma Pheochromocytoma Slow or irregular heartbeat Thyroid disease An unusual or allergic reaction to carvedilol, other beta blockers, medications, foods, dyes, or preservatives Pregnant or trying to get pregnant Breast-feeding How should I use this medication? Take this medication by mouth. Take it as directed on the prescription label at the same time every day. Take it with food. Keep taking it unless your care team tells you to stop. Talk to your care team about the use of this medication in children. Special care may be needed. Overdosage: If you think you have taken too much of this medicine contact a poison control center or emergency room at once. NOTE:  This medicine is only for you. Do not share this medicine with others. What if I miss a dose? If you miss a dose, take it as soon as you can. If it is almost time for your next dose, take only that dose. Do not take double or extra doses. What may interact with this medication? This medication may interact with the following: Certain medications for blood pressure, heart  disease, irregular heartbeat Certain medications for depression, like fluoxetine or paroxetine Certain medications for diabetes, like glipizide or glyburide Cimetidine Clonidine Cyclosporine Digoxin MAOIs like Carbex, Eldepryl, Marplan, Nardil, and Parnate Reserpine Rifampin This list may not describe all possible interactions. Give your health care provider a list of all the medicines, herbs, non-prescription drugs, or dietary supplements you use. Also tell them if you smoke, drink alcohol, or use illegal drugs. Some items may interact with your medicine. What should I watch for while using this medication? Visit your care team for regular checks on your progress. Check your blood pressure as directed. Ask your care team what your blood pressure should be. Also, find out when you should contact them. Do not treat yourself for coughs, colds, or pain while you are using this medication without asking your care team for advice. Some medications may increase your blood pressure. You may get drowsy or dizzy. Do not drive, use machinery, or do anything that needs mental alertness until you know how this medication affects you. Do not stand up or sit up quickly, especially if you are an older patient. This reduces the risk of dizzy or fainting spells. Alcohol may interfere with the effect of this medication. Avoid alcoholic drinks. This medication may increase blood sugar. Ask your care team if changes in diet or medications are needed if you have diabetes. If you are going to need surgery or another procedure, tell your care team that you are using this medication. What side effects may I notice from receiving this medication? Side effects that you should report to your care team as soon as possible: Allergic reactions-skin rash, itching, hives, swelling of the face, lips, tongue, or throat Heart failure-shortness of breath, swelling of the ankles, feet, or hands, sudden weight gain, unusual weakness  or fatigue Low blood pressure-dizziness, feeling faint or lightheaded, blurry vision Raynaud's-cool, numb, or painful fingers or toes that may change color from pale, to blue, to red Slow heartbeat-dizziness, feeling faint or lightheaded, confusion, trouble breathing, unusual weakness or fatigue Worsening mood, feelings of depression Side effects that usually do not require medical attention (report to your care team if they continue or are bothersome): Change in sex drive or performance Diarrhea Dizziness Fatigue Headache This list may not describe all possible side effects. Call your doctor for medical advice about side effects. You may report side effects to FDA at 1-800-FDA-1088. Where should I keep my medication? Keep out of the reach of children and pets. Store at room temperature between 20 and 25 degrees C (68 and 77 degrees F). Protect from moisture. Keep the container tightly closed. Throw away any unused medication after the expiration date. NOTE: This sheet is a summary. It may not cover all possible information. If you have questions about this medicine, talk to your doctor, pharmacist, or health care provider.  2022 [employer]/Gold Standard (2020-01-30 11:31:41)

## 2020-10-16 NOTE — Progress Notes (Signed)
Office Visit   Patient Name: Cheryl Briggs Date of Encounter: 10/16/2020  PCP:  Vidal Schwalbe, [place]  Cardiologist:  Kathlyn Sacramento, MD  Advanced Practice Provider:  Arvil Chaco, PA-C Electrophysiologist:  None   Chief Complaint    Chief Complaint  Patient presents with   Follow-up    Follow up and medications verbally reviewed with patient.    75 y.o. female with a hx of CLL, arthritis, secondhand smoke exposure, non-STEMI with subsequent LHC 08/2019 and significant 3v CAD and complicated/staged PCI (04/17/63 dRCA, 08/19/19 dLCx), PCI on 8/6 complicated by no reflow and bradycardia/hypotension/atrial fibrillation with RVR, post procedure right forearm hematoma, 2/15/222 NSTEMI and PCI to [facility], repeat cath 06/2020 without intervention, HFpEF, hypertension, hyperlipidemia, and here for follow-up after attempting to change medicines for relief of fatigue and SOB.   Past Medical History    Past Medical History:  Diagnosis Date   Allergy    Arthritis    Cancer ([place])    CLL   Past Surgical History:  Procedure Laterality Date   BACK SURGERY     BLADDER REPAIR     CARDIAC CATHETERIZATION     carpel tunnell Bilateral    CHOLECYSTECTOMY     COLONOSCOPY  2015   CORONARY STENT INTERVENTION N/A 08/16/2019   Procedure: CORONARY STENT INTERVENTION;  Surgeon: Nelva Bush, MD;  Location: [place] [facility];  Service: Cardiovascular;  Laterality: N/A;   CORONARY STENT INTERVENTION Left 08/19/2019   Procedure: CORONARY STENT INTERVENTION;  Surgeon: Wellington Hampshire, MD;  Location: [place] [facility];  Service: Cardiovascular;  Laterality: Left;   CORONARY STENT INTERVENTION N/A 02/25/2020   Procedure: CORONARY STENT INTERVENTION;  Surgeon: Nelva Bush, MD;  Location: [place] [facility];  Service: Cardiovascular;  Laterality: N/A;   LEFT HEART CATH AND CORONARY ANGIOGRAPHY N/A 08/16/2019   Procedure: LEFT HEART CATH AND CORONARY  ANGIOGRAPHY;  Surgeon: Nelva Bush, MD;  Location: [place] [facility];  Service: Cardiovascular;  Laterality: N/A;   LEFT HEART CATH AND CORONARY ANGIOGRAPHY N/A 02/25/2020   Procedure: LEFT HEART CATH AND CORONARY ANGIOGRAPHY;  Surgeon: Nelva Bush, MD;  Location: [place] [facility];  Service: Cardiovascular;  Laterality: N/A;   RIGHT/LEFT HEART CATH AND CORONARY ANGIOGRAPHY N/A 06/18/2020   Procedure: RIGHT/LEFT HEART CATH AND CORONARY ANGIOGRAPHY;  Surgeon: Wellington Hampshire, MD;  Location: [place] [facility];  Service: Cardiovascular;  Laterality: N/A;   SHOULDER SURGERY Bilateral    UPPER EXTREMITY ANGIOGRAPHY Right 08/21/2019   Procedure: UPPER EXTREMITY ANGIOGRAPH;  Surgeon: Algernon Huxley, MD;  Location: [place] [facility];  Service: Cardiovascular;  Laterality: Right;    Allergies  Allergies  Allergen Reactions   Corticosteroids     Headache   Other Nausea And Vomiting    Seafood - cramping   Sulfa Antibiotics Anaphylaxis   Clarithromycin     Rectal bleeding    Cymbalta [Duloxetine Hcl] Diarrhea    Abdominal cramping    Prednisone Other (See Comments)    Color changes in face, headache and difficulty walking in high doses    History of Present Illness    Cheryl Briggs is a 75 y.o. female with PMH as above. She has reported exposure to secondhand smoke for approximately 20 years via her husband. She has history of ongoing CP and SOB with recurrent recent caths. Before her 08/2019 NSTEMI, she felt 10/10 chest pain and pressure in her center of her chest  with associated shortness of breath, diaphoresis, nausea, emesis x1, and dizziness.  She had decreased exercise tolerance. LHC 08/16/2019 showed severe multivessel CAD as below with moderately elevated LV filling pressures.  Complex PCI was performed to the dRCA, rPDA, and rPL using DES extending from the dRCA into the rPDA, as well as angioplasty performed of the ostial rPL branch.  Distal RCA PCI was complicated by  no reflow in the distal branches. 10% residual stenosis of the ostial RPL. It was thought that the culprit of her non-ST elevation myocardial infarction was the thrombotic lesion of the distal RCA.  She had transient bradycardia and hypotension with development of atrial fibrillation, thought 2/2 complicated PCI and mediated by acute ischemia, as well as administration of atropine and norepinephrine. She was started on amiodarone (afib) and lasix (elevated filling pressures). She underwent repeat LHC 08/19/2019 with PCI to distal left circumflex and elevated LVEDP .Post procedure, she experienced a right forearm hematoma with recommendation for conservative management. After discharge, she was seen in clinic several times and noted she was overall doing well; however, fatigue and dyspnea did not improve until after starting cardiac rehab. Then, on 02/25/20, she presented to [facility] with worsening angina x2 weeks. Repeat 02/2020 LHC showed multivessel CAD with PCI performed to [facility]. Plan was for ASA and Brilinta for 44mo, though in subsequent visits, Brilinta changed to Plavix for ongoing SOB. Repeat echo LVEF 55-60%, G1DD, mildly reduced RV function. She has been seen clinic after her most recent NSTEMI with ongoing episodes of CP and SOB, despite medication changes and recent intervention. She has noted feeling very stressed in the past, given people who have passed away in the last 6 months. For ongoing tachypalpitations with fatigue / SOB, 04/2020 Zio monitor performed and showed NSR with 1 run of VT, 8 SVT runs, and rare PAC/PVCs. Imdur increased for CP without relief. Seen 05/2020 with ongoing sx and scheduled for [facility] after speaking with Dr. Fletcher Anon and due to recurrent sx. 06/18/20 LHC was with widely patent Lcx and RCA stents without significant restenosis. Ost RCA to pRCA 30%s. RPDA 50%s, RPAV 10%s.  The p-mLCx had stable moderate dz at 60% with chronically occluded OM1 with L-L collaterals. There was significant dz in  the second diag at 80%s, unchanged. The branch was 90mm in diameter and dz extended back to the ostium. RHC was with nl filling pressure, pulmonary pressure, and CO. Echo showed EF 55-60%, NRWMA, G1DD, calcified aortic valve without stenosis. When seen at RTC, and after discussion with primary cardiologist, Brilinta changed to Plavix. She had ongoing sx of choking /brief palpitations and fatigue/DOE. She was hoping to go to [place] soon and wanted to feel better before leaving on this trip.   Today, 10/16/2020, she returns to clinic and notes that she still does not feel she has regained enough of her energy to keep up with the group that is going to [place] and has decided to stay back from this trip.  She reports she continues to have episodes of very sharp chest pain, lasting only a second.  She reports ongoing shortness of breath without clear triggers.  She states that sometimes she has shortness of breath, and other times she does not experience it.  She still remaining active on the treadmill.  She reports she has now started to do the step machine for 30 minutes on the treadmill for another 30 minutes.  She reports feeling palpitations every once in a great while, though not frequently.  No presyncope or syncope.  No signs or symptoms of bleeding.  She reports ongoing fatigue.  She is usually spent by the afternoon/lunchtime and so tries to get most of her tasks done in the early morning.  She does not feel that she will be able to keep up in [place], given she is so spent by lunch.  She reports SBP 1 20-1 tens with rates 70s to 90s.  She brings her BP and heart rate log with her today. SBP 120-110s, HR 70-90s.  She wonders if anything else can be done to help with her energy and endurance.  She does not feel as if changing from Brilinta to Plavix made a difference in her breathing or exercise tolerance.   Home Medications    Prior to Admission medications   Medication Sig Start Date End Date Taking?  Authorizing Provider  aspirin EC 81 MG EC tablet Take 1 tablet (81 mg total) by mouth daily. Swallow whole. 08/19/19  Yes Jennye Boroughs, MD  atorvastatin (LIPITOR) 80 MG tablet Take 1 tablet (80 mg total) by mouth daily. 08/19/19  Yes Jennye Boroughs, MD  Bioflavonoid Products (ESTER C PO) Take 1,000 mg by mouth daily.   Yes [provider]  calcium citrate-vitamin D (CITRACAL+D) 315-200 MG-UNIT tablet Take 1 tablet by mouth 2 (two) times daily.   Yes [provider]  Chlorpheniramine Maleate (CHLOR-TABLETS PO) Take 1 tablet by mouth as needed.    Yes [provider]  Cholecalciferol (VITAMIN D-1000 MAX ST) 25 MCG (1000 UT) tablet Take 3,000 Units by mouth daily.   Yes [provider]  desonide (DESOWEN) 0.05 % ointment desonide 0.05 % topical ointment 02/21/17  Yes [provider]  gabapentin (NEURONTIN) 300 MG capsule Take 300 mg by mouth 3 (three) times daily. 08/26/19  Yes [provider]  metoprolol succinate (TOPROL-XL) 50 MG 24 hr tablet Take 1.5 tablets (75 mg total) by mouth daily. 08/20/19  Yes Jennye Boroughs, MD  Multiple Vitamins-Minerals (MULTIVITAMIN WITH MINERALS) tablet Take 1 tablet by mouth daily.   Yes [provider]  Omeprazole (PRILOSEC PO) Take 1 tablet by mouth daily.    Yes [provider]  ticagrelor (BRILINTA) 90 MG TABS tablet Take 1 tablet (90 mg total) by mouth 2 (two) times daily. 08/19/19 08/18/20 Yes Jennye Boroughs, MD    Review of Systems    She reports rare palpitations without presyncope or syncope.  No signs or symptoms of bleeding.  She reports sharp chest pain, lasting only a second, and without clear triggers.  She has intermittent shortness of breath, also without clear triggers, and occurring at rest and with exertion.  She reports significant fatigue by 12 PM each day.  She does not feel that the change from Brilinta to Plavix made a difference in her breathing or endurance.  No pnd, orthopnea, n,  v, syncope, weight gain, or early satiety. All other systems reviewed and are otherwise negative except as noted above.  Physical Exam    VS:  BP 120/70 (BP Location: Left Arm, Patient Position: Sitting, Cuff Size: Normal)   Pulse 74   Ht 5' 1.5" (1.562 m)   Wt 135 lb (61.2 kg)   SpO2 96%   BMI 25.09 kg/m  , BMI Body mass index is 25.09 kg/m. GEN: Well nourished, well developed, in no acute distress. Joined by her daughter. HEENT: normal. Neck: Supple, no JVD, carotid bruits, or masses. Cardiac: RRR, 1/6 systolic murmur in right upper sternal border, rubs,  or gallops. No clubbing, cyanosis, edema. Radial pulses 1+ R and 2+L.  Respiratory:  Respirations regular and unlabored, clear to auscultation bilaterally. GI: Soft, nontender, nondistended, BS + x 4. MS: no deformity or atrophy. Skin: warm and dry, no rash. Neuro:  Strength and sensation are intact. Psych: Normal affect.  Accessory Clinical Findings    ECG personally reviewed by me today -No EKG  - no acute changes.  VITALS Reviewed today   Temp Readings from Last 3 Encounters:  06/18/20 97.7 F (36.5 C) (Axillary)  05/11/20 (!) 97.4 F (36.3 C)  02/26/20 98.8 F (37.1 C) (Oral)   BP Readings from Last 3 Encounters:  10/16/20 120/70  08/03/20 100/60  06/18/20 109/65   Pulse Readings from Last 3 Encounters:  10/16/20 74  08/03/20 75  06/18/20 70    Wt Readings from Last 3 Encounters:  10/16/20 135 lb (61.2 kg)  08/03/20 135 lb (61.2 kg)  06/05/20 135 lb (61.2 kg)     LABS  reviewed today   Lab Results  Component Value Date   WBC 11.9 (H) 06/05/2020   HGB 14.2 06/05/2020   HCT 41.7 06/05/2020   MCV 97 06/05/2020   PLT 272 06/05/2020   Lab Results  Component Value Date   CREATININE 0.73 06/05/2020   BUN 7 (L) 06/05/2020   NA 137 06/05/2020   K 4.4 06/05/2020   CL 101 06/05/2020   CO2 21 06/05/2020   Lab Results  Component Value Date   ALT 29 10/06/2017   AST 36 10/06/2017   ALKPHOS 75  10/06/2017   BILITOT 0.6 10/06/2017   Lab Results  Component Value Date   CHOL 139 09/24/2019   HDL 61 09/24/2019   LDLCALC 54 09/24/2019   LDLDIRECT 51 09/24/2019   TRIG 138 09/24/2019   CHOLHDL 2.3 09/24/2019    Lab Results  Component Value Date   HGBA1C 6.0 (H) 08/15/2019   Lab Results  Component Value Date   TSH 1.114 02/25/2020     STUDIES/PROCEDURES reviewed today   Echo 07/22/20  1. Left ventricular ejection fraction, by estimation, is 55 to 60%. The  left ventricle has normal function. The left ventricle has no regional  wall motion abnormalities. Left ventricular diastolic parameters are  consistent with Grade I diastolic  dysfunction (impaired relaxation).   2. Right ventricular systolic function is normal. The right ventricular  size is normal.   3. The mitral valve is normal in structure. No evidence of mitral valve  regurgitation.   4. The aortic valve is calcified. Aortic valve regurgitation is not  visualized. Mild to moderate aortic valve sclerosis/calcification is  present, without any evidence of aortic stenosis.   5. The inferior vena cava is normal in size with greater than 50%  respiratory variability, suggesting right atrial pressure of 3 mmHg.   [facility] 06/18/20 2nd Diag lesion is 80% stenosed. 1st Mrg lesion is 100% stenosed. Prox Cx to Mid Cx lesion is 60% stenosed. Non-stenotic Dist Cx lesion was previously treated. RPAV lesion is 10% stenosed. Non-stenotic Dist RCA lesion with 0% stenosed side branch in RPDA was previously treated. RPDA lesion is 50% stenosed. Ost RCA to Prox RCA lesion is 30% stenosed. Non-stenotic Mid RCA lesion was previously treated. 1.  Widely patent left circumflex and RCA stents with no significant restenosis.  Stable moderate disease in proximal left circumflex with chronically occluded OM1 with collaterals as well as significant disease in the second diagonal which is unchanged.  This branch is  about 2 mm in diameter  and the disease extends all the way back to the ostium. 2.  Unremarkable right heart catheterization with normal filling pressures, normal pulmonary pressure normal cardiac output. Coronary Diagrams   Diagnostic Dominance: Right        Recommendations: Continue medical therapy. I wonder if some of her dyspnea might be related to ticagrelor.  Zio 04/07/20 Patch Wear Time:  13 days and 17 hours Patient had a min HR of 54 bpm, max HR of 197 bpm, and avg HR of 76 bpm. Predominant underlying rhythm was Sinus Rhythm. 1 run of Ventricular Tachycardia occurred lasting 5 beats with a max rate of 133 bpm (avg 110 bpm). 8 Supraventricular Tachycardia runs occurred, the run with the fastest interval lasting 5 beats with a max rate of 197 bpm, the longest lasting 23.7 secs with an avg rate of 112 bpm. Rare PACs and rare PVCs with a burden of less than 1%.  Echo 02/2020  1. Left ventricular ejection fraction, by estimation, is 55 to 60%. The  left ventricle has normal function. Left ventricular endocardial border  not optimally defined to evaluate regional wall motion. There is mild left  ventricular hypertrophy. Left  ventricular diastolic parameters are consistent with Grade I diastolic  dysfunction (impaired relaxation).   2. Right ventricular systolic function is mildly reduced. The right  ventricular size is normal. Mildly increased right ventricular wall  thickness. There is normal pulmonary artery systolic pressure.   3. The mitral valve is normal in structure. Mild mitral valve  regurgitation. No evidence of mitral stenosis.   4. The aortic valve is tricuspid. There is mild calcification of the  aortic valve. There is mild thickening of the aortic valve. Aortic valve  regurgitation is not visualized. Mild to moderate aortic valve  sclerosis/calcification is present, without any  evidence of aortic stenosis.   5. The inferior vena cava is normal in size with <50% respiratory   variability, suggesting right atrial pressure of 8 mmHg.    Cardiac cath 02/2020 Conclusions: Multivessel coronary artery disease, including 80% D2 stenosis, 60% prox/mid LCx disease, chronic total occlusion of OM1, 99% mid RCA stenosis, and 50% rPDA lesion. Widely patent LCx and distal RCA/PDA stents. Normal left ventricular filling pressure. Successful PCI to mid RCA using Resolute Onyx 2.75 x 15 mm drug-eluting stent with 0% residual stenosis and TIMI-3 flow.   Recommendations: Continue dual antiplatelet therapy with aspirin and ticagrelor x 12 months. Aggressive secondary prevention. Obtain echocardiogram. Anticipate discharge home tomorrow if no post-cath complications.   Nelva Bush, MD [facility]     Coronary Diagrams     Diagnostic Dominance: Right      Intervention            2D echo 08/15/2019: 1. Left ventricular ejection fraction, by estimation, is 55 to 60%. The  left ventricle has normal function. The left ventricle has no regional  wall motion abnormalities. There is mild left ventricular hypertrophy.  Left ventricular diastolic parameters  were normal.   2. Right ventricular systolic function is normal. The right ventricular  size is normal. There is normal pulmonary artery systolic pressure.   3. The mitral valve is normal in structure. Mild mitral valve  regurgitation. No evidence of mitral stenosis.   4. The aortic valve is abnormal. Aortic valve regurgitation is not  visualized. Mild to moderate aortic valve sclerosis/calcification is  present, without any evidence of aortic stenosis.  __________   LHC 08/16/2019: A drug-eluting stent  was successfully placed using a STENT SYNERGY DES 2.25X20. A stent was successfully placed. Conclusions: Severe multivessel coronary artery disease, including 80% D2 lesion, sequential 60% proximal and 80% distal LCx stenoses, chronic total occlusion of OM1, 50-60% mid RCA stenosis, and thrombotic 99% distal  RCA lesion extending into the rPDA. Moderately elevated left ventricular filling pressure. Complex PCI to distal RCA, rPDA, and rPLA using Synergy 2.25 x 20 mm drug-eluting stent extending from the distal RCA into the rPDA, as well as angioplasty of the ostial rPL branch.  Intervention was complicated by no-reflow with transient hypotension/bradycardia and subsequent development of atrial fibrillation with rapid ventricular response.  Final angiogram shows 0% residual stenosis in the distal RCA/rPDA and 10% residual stenosis at ostial rPL with TIMI-2 flow. Recommendations: Transfer to ICU for aggressive medical therapy and post-PCI monitoring. Continue tirofiban infusion for 18 hours. Dual antiplatelet therapy with aspirin and ticagrelor for at least 12 months. Titrate nitroglycerine infusion for relief of chest pain. Continue amiodarone infusion for rate control and hopefully conversion to sinus rhythm.  If patient remains in atrial fibrillation tomorrow, IV heparin should be started (will defer heparin at this time given tirofiban infusion). Aggressive secondary prevention. Anticipate relook catheterization on Monday with PCI to the LCx.  I favor medical therapy for D2, given relatively small vessel size and need to stent back to the LAD, were intervention undertaken. __________   LHC 08/19/2019: 2nd Diag lesion is 80% stenosed. 1st Mrg lesion is 100% stenosed. Prox Cx to Mid Cx lesion is 60% stenosed. Dist Cx lesion is 80% stenosed. Mid RCA lesion is 55% stenosed. RPAV lesion is 10% stenosed. Balloon angioplasty was performed. Previously placed Dist RCA drug-eluting stents are widely patent with 0% stenosed side branch in RPDA. Post intervention, there is a 0% residual stenosis. A drug-eluting stent was successfully placed using a STENT RESOLUTE ONYX 2.75X18.   1.  Widely patent RCA stent with normal flow distally.  Unchanged left coronary artery disease. 2.  Mildly elevated left  ventricular end-diastolic pressure at 14 mmHg. 3.  Successful direct stenting of the distal left circumflex. Recommendations: Continue dual antiplatelet therapy for 1 year. Aggressive treatment of risk factors. If the patient remains stable, she can potentially be discharged home in the afternoon.  Assessment & Plan   Coronary artery disease  S/p PCI 08/2019, 02/2020 History of NSTEMI --Notes atypical chest pain, sharp and lasting only a second, which we discussed has atypical features and is not concerning for coronary insufficiency. She has SOB/DOE inconsistently / without clear triggers.  No improvement in sx smptoms with transition from Brilinta to Plavix. Recent LHC with patent DES to [place], and dLCx.  Recent echo EF nl with NRWMA. Euvolemic and well compensated on exam. Continue DAPT and losartan 12.5mg .  Given her ongoing fatigue, we will trial carvedilol 12.5 mg twice daily in place of Toprol, to see if this helps with her symptoms.  If no improvement or worsening symptoms between visits, she will restart Toprol / discontinue carvedilol and update our office.  Continue Imdur 60 mg qd. Continue statin and ongoing heart healthy diet/activity.  History of pSVT, NSVT. H/o atrial fibrillation with RVR s/p PCI -- Improved tachypalpitations s/p repeat Zio monitor as above. To see if improvement in fatigue, she will hold Toprol and trial carvedilol 12.5mg  BID as above and keep the office posted on her sx between visits.  G1DD --Reports inconsistent dyspnea/shortness of breath without clear triggers.  Most recent echo as above with EF  normal, G1DD.   Euvolemic and well compensated on exam.  No indication for loop diuretic.  Continue to follow fluid and salt restrictions.  She will trial carvedilol and hold Toprol as above and to see if this helps with her fatigue.  Continue losartan 12.5 mg. Continue diet and exercise as discussed.  HLD, LDL goal <70 --Continue statin for target LDL less than  70.  No improved sx with change from Lipitor to Crestor.   Hypertension, goal BP <130/80 -BP well controlled.  No medication changes.  She will trial carvedilol and hold Toprol to see if this improves her fatigue.  Continue losartan 12.5 mg, Imdur. Continue to monitor BP at home with goal BP 130/80 or lower.  Elevated A1C -Previous 08/2019 A1c elevated at 6.0.  Recommend glycemic control and management per PCP.  Continue ARB.  Consider addition of Farxiga at RTC if BP /renal function allows.  We will hold off on addition of Farxiga for now, as we wish to see if changing Toprol to carvedilol helps with her energy, and thus we will avoid more than 1 medication change at a time.  Medication changes:. --Stop Toprol for now. Trial carvedilol 12.5mg  BID to see if this helps her fatigue. Labs ordered: None. Studies / Imaging ordered: None.  Future considerations: [place]ade (avoiding more than 1 medication change at a time) Disposition: RTC 2-3 mo  Arvil Chaco, PA-C 10/16/2020

## 2020-10-19 ENCOUNTER — Encounter: Payer: Self-pay | Admitting: Physician Assistant

## 2020-10-19 ENCOUNTER — Telehealth: Payer: Self-pay

## 2020-10-19 MED ORDER — METOPROLOL SUCCINATE ER 50 MG PO TB24: 75.0000 mg | ORAL_TABLET | Freq: Every day | ORAL | 3 refills | Status: DC

## 2020-10-19 NOTE — Telephone Encounter (Signed)
Reach back out to pt to f/u from earlier conversation.  Marrianne Mood D, PA-C   Yes - please have her restart her Toprol. We will discontinue her Coreg.     Pt thankful for return call, medication list updated and Toprol sent in to pharmacy.

## 2020-10-19 NOTE — Telephone Encounter (Signed)
Pt c/o medication issue:  1. Name of Medication: Carvedilol  2. How are you currently taking this medication (dosage and times per day)? 12.5mg  1 tabley twice daily  3. Are you having a reaction (difficulty breathing--STAT)? no  4. What is your medication issue? Dizziness, sweaty, feeling weak

## 2020-10-19 NOTE — Telephone Encounter (Signed)
Was able to reach out to Cheryl Briggs regarding her recent medication changes. Pt reports since starting on Carvedilol 10/8 she has felt "horrible", reports s/s of "fatigue, weakness, dizziness, and diaphoresis at times".   OV AVS 10/16/2020 advised from Blanche East, PA-C   1) STOP toprol (metoprolol succinate)   2) START coreg (carvedilol) 12.5 mg- take 1 tablet by mouth TWICE daily    - After 2 weeks on the carvedilol, please call the office and let us know how you are tolerating this medication  Advised pt to stop her carvedilol and restart her metoprolol succinate 75 mg daily, monitor BP and HR 2 hours after medication until further notice.  Will route message to Blanche East, PA-C to advised on need for medication adjustment as carvedilol is not making pt feel well. Pt understands to await call back for further instructions.

## 2020-10-19 NOTE — Addendum Note (Signed)
Addended by: Wynema Birch on: 10/19/2020 04:22 PM   Modules accepted: Orders

## 2020-11-29 DIAGNOSIS — R000 Tachycardia, unspecified: Secondary | ICD-10-CM | POA: Insufficient documentation

## 2020-11-29 DIAGNOSIS — H35369 Drusen (degenerative) of macula, unspecified eye: Secondary | ICD-10-CM | POA: Insufficient documentation

## 2020-11-30 DIAGNOSIS — I214 Non-ST elevation (NSTEMI) myocardial infarction: Secondary | ICD-10-CM | POA: Insufficient documentation

## 2020-11-30 DIAGNOSIS — R7303 Prediabetes: Secondary | ICD-10-CM | POA: Insufficient documentation

## 2021-01-21 ENCOUNTER — Other Ambulatory Visit: Payer: Self-pay

## 2021-01-21 ENCOUNTER — Ambulatory Visit (INDEPENDENT_AMBULATORY_CARE_PROVIDER_SITE_OTHER): Payer: Medicare Other | Admitting: Cardiovascular Disease

## 2021-01-21 ENCOUNTER — Encounter: Payer: Self-pay | Admitting: Cardiovascular Disease

## 2021-01-21 VITALS — BP 124/72 | HR 71 | Ht 62.0 in | Wt 137.0 lb

## 2021-01-21 DIAGNOSIS — E7800 Pure hypercholesterolemia, unspecified: Secondary | ICD-10-CM

## 2021-01-21 DIAGNOSIS — I25118 Atherosclerotic heart disease of native coronary artery with other forms of angina pectoris: Secondary | ICD-10-CM | POA: Diagnosis not present

## 2021-01-21 DIAGNOSIS — I10 Essential (primary) hypertension: Secondary | ICD-10-CM

## 2021-01-21 NOTE — Patient Instructions (Signed)

## 2021-01-21 NOTE — Progress Notes (Signed)
Cardiology Office Note   Date:  01/21/2021   ID:  Cheryl Briggs, DOB 12-02-1945, MRN [id number]  PCP:  Vidal Schwalbe, MD  Cardiologist:   Kathlyn Sacramento, MD   Chief Complaint  Patient presents with   Other    3 month f/u no complaints today. Meds reviewed verbally with pt.      History of Present Illness: Cheryl Briggs is a 76 y.o. female who presents for a follow-up visit regarding coronary artery disease. She has known history of CLL, arthritis, secondhand smoking and hyperlipidemia. She presented in August 2021 with non-ST elevation myocardial infarction.  Cardiac catheterization showed severe three-vessel coronary artery disease with thrombotic 99% stenosis in the distal right coronary artery.  She underwent complex PCI to the distal RCA/right PDA with drug-eluting stent placement.  Procedure was complicated by no reflow with transient hypotension and bradycardia and subsequent atrial fibrillation with RVR.  Repeat cardiac catheterization few days later showed patent RCA stent with normal flow distally.  PCI of the distal left circumflex was performed.  She had recurrent unstable angina in February 2022.  Cardiac catheterization showed progression of mid RCA stenosis to 99%.  This was treated with drug-eluting stent placement. She had recurrent chest pain last year and underwent repeat cardiac catheterization in June which showed widely patent stents with no significant restenosis, stable moderate proximal left circumflex stenosis with chronically occluded OM1 with collaterals and significant diagonal disease which was unchanged but relatively small branch.  Right heart catheterization showed normal filling pressures, normal pulmonary pressures and normal cardiac output.  Some of her shortness of breath was felt to be due to ticagrelor and was subsequently switched to clopidogrel.  She has been doing well with no recent chest pain, shortness of breath or palpitations.  She is  feeling well overall with no side effects to medications.  Past Medical History:  Diagnosis Date   Allergy    Arthritis    Cancer ([facility])    CLL    Past Surgical History:  Procedure Laterality Date   BACK SURGERY     BLADDER REPAIR     CARDIAC CATHETERIZATION     carpel tunnell Bilateral    CHOLECYSTECTOMY     COLONOSCOPY  2015   CORONARY STENT INTERVENTION N/A 08/16/2019   Procedure: CORONARY STENT INTERVENTION;  Surgeon: Nelva Bush, MD;  Location: [facility];  Service: Cardiovascular;  Laterality: N/A;   CORONARY STENT INTERVENTION Left 08/19/2019   Procedure: CORONARY STENT INTERVENTION;  Surgeon: Wellington Hampshire, MD;  Location: [facility];  Service: Cardiovascular;  Laterality: Left;   CORONARY STENT INTERVENTION N/A 02/25/2020   Procedure: CORONARY STENT INTERVENTION;  Surgeon: Nelva Bush, MD;  Location: [facility];  Service: Cardiovascular;  Laterality: N/A;   LEFT HEART CATH AND CORONARY ANGIOGRAPHY N/A 08/16/2019   Procedure: LEFT HEART CATH AND CORONARY ANGIOGRAPHY;  Surgeon: Nelva Bush, MD;  Location: [facility];  Service: Cardiovascular;  Laterality: N/A;   LEFT HEART CATH AND CORONARY ANGIOGRAPHY N/A 02/25/2020   Procedure: LEFT HEART CATH AND CORONARY ANGIOGRAPHY;  Surgeon: Nelva Bush, MD;  Location: [facility];  Service: Cardiovascular;  Laterality: N/A;   RIGHT/LEFT HEART CATH AND CORONARY ANGIOGRAPHY N/A 06/18/2020   Procedure: RIGHT/LEFT HEART CATH AND CORONARY ANGIOGRAPHY;  Surgeon: Wellington Hampshire, MD;  Location: [facility];  Service: Cardiovascular;  Laterality: N/A;   SHOULDER SURGERY Bilateral    UPPER EXTREMITY ANGIOGRAPHY Right 08/21/2019  Procedure: UPPER EXTREMITY ANGIOGRAPH;  Surgeon: Algernon Huxley, MD;  Location: [facility];  Service: Cardiovascular;  Laterality: Right;     Current Outpatient Medications  Medication Sig Dispense Refill   aspirin EC 81 MG EC tablet  Take 1 tablet (81 mg total) by mouth daily. Swallow whole.     azelastine (ASTELIN) 0.1 % nasal spray Place 1 spray into both nostrils daily.     Bioflavonoid Products (ESTER C PO) Take 1,000 mg by mouth daily.     Calcium Citrate-Vitamin D (CITRACAL + D PO) Take 2 tablets by mouth daily.     Cholecalciferol (VITAMIN D) 50 MCG (2000 UT) tablet Take 2,000 Units by mouth daily. Take with 1000 unit vitamin d to equal 3000 units daily     Cholecalciferol 25 MCG (1000 UT) tablet Take 1,000 Units by mouth daily. Take with 2000 unit vitamin d to equal 3000 units daily     clopidogrel (PLAVIX) 75 MG tablet Take 1 tablet (75 mg total) by mouth daily. Take 4 tablets (300 MG) the first day, then take 1 tablet (75 MG) once daily. 34 tablet 5   desonide (DESOWEN) 0.05 % ointment Apply 1 application topically daily as needed (rash around eyelids).     diphenhydrAMINE (BENADRYL) 25 MG tablet Take 12.5 mg by mouth daily as needed for itching, allergies or sleep.     docusate sodium (COLACE) 100 MG capsule Take 100 mg by mouth daily.     isosorbide mononitrate (IMDUR) 60 MG 24 hr tablet Take 1 tablet (60 mg total) by mouth daily. 90 tablet 3   losartan (COZAAR) 25 MG tablet TAKE (1/2) TABLET BY MOUTH ONCE DAILY. 45 tablet 1   Lutein 10 MG TABS Take 10 mg by mouth daily.     metoprolol succinate (TOPROL-XL) 50 MG 24 hr tablet Take 1.5 tablets (75 mg total) by mouth daily. 135 tablet 3   Multiple Vitamins-Minerals (HAIR SKIN AND NAILS FORMULA PO) Take 2 capsules by mouth daily.     Multiple Vitamins-Minerals (ICAPS AREDS 2 PO) Take by mouth.     Multiple Vitamins-Minerals (MULTIVITAMIN WITH MINERALS) tablet Take 1 tablet by mouth daily.     Naphazoline-Pheniramine (OPCON-A) 0.027-0.315 % SOLN Place 1 drop into both eyes daily.     nitroGLYCERIN (NITROSTAT) 0.4 MG SL tablet Place 1 tablet (0.4 mg total) under the tongue every 5 (five) minutes as needed for chest pain. Maximum of 3 doses. 25 tablet 1   omeprazole  (PRILOSEC OTC) 20 MG tablet Take 20 mg by mouth daily.     rosuvastatin (CRESTOR) 20 MG tablet Take 1 tablet (20 mg total) by mouth daily. 90 tablet 1   traMADol-acetaminophen (ULTRACET) 37.5-325 MG tablet Take 1 tablet by mouth 2 (two) times daily.     No current facility-administered medications for this visit.    Allergies:   Corticosteroids, Other, Sulfa antibiotics, Clarithromycin, Cymbalta [duloxetine hcl], and Prednisone    Social History:  The patient  reports that she has never smoked. She has never used smokeless tobacco. She reports that she does not drink alcohol and does not use drugs.   Family History:  The patient's family history includes Heart attack in her father; Heart disease in her mother.    ROS:  Please see the history of present illness.   Otherwise, review of systems are positive for none.   All other systems are reviewed and negative.    PHYSICAL EXAM: VS:  BP 124/72 (BP Location: Left  Arm, Patient Position: Sitting, Cuff Size: Normal)    Pulse 71    Ht 5\' 2"  (1.575 m)    Wt 137 lb (62.1 kg)    SpO2 98%    BMI 25.06 kg/m  , BMI Body mass index is 25.06 kg/m. GEN: Well nourished, well developed, in no acute distress  HEENT: normal  Neck: no JVD, carotid bruits, or masses Cardiac: RRR; no rubs, or gallops,no edema .  2 out of 6 systolic murmur in the aortic area which is early peaking. Respiratory:  clear to auscultation bilaterally, normal work of breathing GI: soft, nontender, nondistended, + BS MS: no deformity or atrophy  Skin: warm and dry, no rash Neuro:  Strength and sensation are intact Psych: euthymic mood, full affect   EKG:  EKG is ordered today. The ekg ordered today demonstrates normal sinus rhythm with low voltage.   Recent Labs: 02/25/2020: TSH 1.114 03/16/2020: B Natriuretic Peptide 98.7 06/05/2020: BUN 7; Creatinine, Ser 0.73; Hemoglobin 14.2; Platelets 272; Potassium 4.4; Sodium 137    Lipid Panel    Component Value Date/Time   CHOL  139 09/24/2019 1152   TRIG 138 09/24/2019 1152   HDL 61 09/24/2019 1152   CHOLHDL 2.3 09/24/2019 1152   CHOLHDL 2.7 08/15/2019 1024   VLDL 33 08/15/2019 1024   LDLCALC 54 09/24/2019 1152   LDLDIRECT 51 09/24/2019 1152      Wt Readings from Last 3 Encounters:  01/21/21 137 lb (62.1 kg)  10/16/20 135 lb (61.2 kg)  08/03/20 135 lb (61.2 kg)       No flowsheet data found.    ASSESSMENT AND PLAN:  1.  Coronary artery disease involving native coronary arteries with stable angina: The patient's symptoms are now well controlled with medical therapy including Toprol and Imdur.  Most recent cardiac catheterization showed patent stents with stable small vessel disease.  Continue same medications.  2.  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  No evidence of recurrent atrial fibrillation and thus no need for anticoagulation for now.  3.  Essential hypertension: Blood pressures well controlled on current medications.  4.  Hyperlipidemia: Continue rosuvastatin 20 mg once daily.  Most recent lipid profile showed an LDL of 51.  ] Disposition:   FU with me in 6 months  Signed,  Kathlyn Sacramento, MD  01/21/2021 9:13 AM    [place]

## 2021-02-05 ENCOUNTER — Other Ambulatory Visit: Payer: Self-pay | Admitting: Cardiovascular Disease

## 2021-02-18 ENCOUNTER — Other Ambulatory Visit: Payer: Self-pay | Admitting: Cardiovascular Disease

## 2021-03-12 ENCOUNTER — Other Ambulatory Visit: Payer: Self-pay

## 2021-03-12 MED ORDER — LOSARTAN POTASSIUM 25 MG PO TABS: ORAL_TABLET | ORAL | 1 refills | Status: DC

## 2021-03-12 NOTE — Telephone Encounter (Signed)
*  STAT* If patient is at the pharmacy, call can be transferred to refill team.   1. Which medications need to be refilled? (please list name of each medication and dose if known) Losartan  2. Which pharmacy/location (including street and city if local pharmacy) is medication to be sent to? [facility]   3. Do they need a 30 day or 90 day supply? [place]

## 2021-05-05 ENCOUNTER — Other Ambulatory Visit: Payer: Self-pay | Admitting: Family

## 2021-05-05 NOTE — Telephone Encounter (Signed)
Rx(s) sent to pharmacy electronically.  

## 2021-05-11 ENCOUNTER — Inpatient Hospital Stay: Payer: Medicare Other | Attending: Oncology

## 2021-05-11 ENCOUNTER — Ambulatory Visit: Payer: Medicare Other | Admitting: Oncology

## 2021-05-11 ENCOUNTER — Other Ambulatory Visit: Payer: Medicare Other

## 2021-05-11 ENCOUNTER — Inpatient Hospital Stay (HOSPITAL_BASED_OUTPATIENT_CLINIC_OR_DEPARTMENT_OTHER): Payer: Medicare Other | Admitting: Oncology

## 2021-05-11 ENCOUNTER — Encounter: Payer: Self-pay | Admitting: Oncology

## 2021-05-11 VITALS — BP 124/68 | HR 81 | Resp 18 | Wt 139.0 lb

## 2021-05-11 DIAGNOSIS — M159 Polyosteoarthritis, unspecified: Secondary | ICD-10-CM | POA: Insufficient documentation

## 2021-05-11 DIAGNOSIS — C9110 Chronic lymphocytic leukemia of B-cell type not having achieved remission: Secondary | ICD-10-CM | POA: Insufficient documentation

## 2021-05-11 DIAGNOSIS — I214 Non-ST elevation (NSTEMI) myocardial infarction: Secondary | ICD-10-CM | POA: Insufficient documentation

## 2021-05-11 DIAGNOSIS — Z79899 Other long term (current) drug therapy: Secondary | ICD-10-CM | POA: Insufficient documentation

## 2021-05-11 LAB — CBC WITH DIFFERENTIAL/PLATELET
Abs Immature Granulocytes: 0.03 10*3/uL (ref 0.00–0.07)
Basophils Absolute: 0.1 10*3/uL (ref 0.0–0.1)
Basophils Relative: 1 %
Eosinophils Absolute: 0.3 10*3/uL (ref 0.0–0.5)
Eosinophils Relative: 2 %
HCT: 43.2 % (ref 36.0–46.0)
Hemoglobin: 14.4 g/dL (ref 12.0–15.0)
Immature Granulocytes: 0 %
Lymphocytes Relative: 67 %
Lymphs Abs: 9.9 10*3/uL — ABNORMAL HIGH (ref 0.7–4.0)
MCH: 32.7 pg (ref 26.0–34.0)
MCHC: 33.3 g/dL (ref 30.0–36.0)
MCV: 98 fL (ref 80.0–100.0)
Monocytes Absolute: 0.6 10*3/uL (ref 0.1–1.0)
Monocytes Relative: 4 %
Neutro Abs: 3.9 10*3/uL (ref 1.7–7.7)
Neutrophils Relative %: 26 %
Platelets: 249 10*3/uL (ref 150–400)
RBC: 4.41 MIL/uL (ref 3.87–5.11)
RDW: 12.7 % (ref 11.5–15.5)
WBC: 14.9 10*3/uL — ABNORMAL HIGH (ref 4.0–10.5)
nRBC: 0 % (ref 0.0–0.2)

## 2021-05-11 NOTE — Progress Notes (Signed)
?[facility]  ?[address], [address] ?[place], [place] [address] ?Phone: [phone] ? Fax: [phone] ? ? ?Clinic Day:  05/11/2021 ? ?Referring physician: Vidal Schwalbe, MD ? ?Chief Complaint: Cheryl Briggs is a 76 y.o. 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 ? ?HPI:  ?Cheryl Briggs was initially followed by Dr. Mike Gip and last evaluated on 03/01/2017.  Has had elevated white blood cell count since roughly 2015.  She is followed by hematology annually to assess stability of her lab work.  White blood cell count typically remains between 14 and 15.  ? ?Interval History-here for follow-up.  Denies any new concerns since her last visit a year ago.  Continues to remain active and exercises some throughout the week.  Continues to have episodes of chest pain and pressure but is followed by cardiology.  No new lumps or bumps.  No B symptoms. ? ?Past Medical History:  ?Diagnosis Date  ? Allergy   ? Arthritis   ? Cancer [facility])   ? CLL  ? ? ?Past Surgical History:  ?Procedure Laterality Date  ? BACK SURGERY    ? BLADDER REPAIR    ? CARDIAC CATHETERIZATION    ? carpel tunnell Bilateral   ? CHOLECYSTECTOMY    ? COLONOSCOPY  2015  ? CORONARY STENT INTERVENTION N/A 08/16/2019  ? Procedure: CORONARY STENT INTERVENTION;  Surgeon: Nelva Bush, MD;  Location: [facility];  Service: Cardiovascular;  Laterality: N/A;  ? CORONARY STENT INTERVENTION Left 08/19/2019  ? Procedure: CORONARY STENT INTERVENTION;  Surgeon: Wellington Hampshire, MD;  Location: [facility];  Service: Cardiovascular;  Laterality: Left;  ? CORONARY STENT INTERVENTION N/A 02/25/2020  ? Procedure: CORONARY STENT INTERVENTION;  Surgeon: Nelva Bush, MD;  Location: [facility];  Service: Cardiovascular;  Laterality: N/A;  ? LEFT HEART CATH AND CORONARY ANGIOGRAPHY N/A 08/16/2019  ? Procedure: LEFT HEART CATH AND CORONARY ANGIOGRAPHY;  Surgeon: Nelva Bush, MD;  Location: [facility];  Service: Cardiovascular;  Laterality: N/A;  ? LEFT HEART CATH AND CORONARY ANGIOGRAPHY N/A 02/25/2020  ? Procedure: LEFT HEART CATH AND CORONARY ANGIOGRAPHY;  Surgeon: Nelva Bush, MD;  Location: [facility];  Service: Cardiovascular;  Laterality: N/A;  ? RIGHT/LEFT HEART CATH AND CORONARY ANGIOGRAPHY N/A 06/18/2020  ? Procedure: RIGHT/LEFT HEART CATH AND CORONARY ANGIOGRAPHY;  Surgeon: Wellington Hampshire, MD;  Location: [facility];  Service: Cardiovascular;  Laterality: N/A;  ? SHOULDER SURGERY Bilateral   ? UPPER EXTREMITY ANGIOGRAPHY Right 08/21/2019  ? Procedure: UPPER EXTREMITY ANGIOGRAPH;  Surgeon: Algernon Huxley, MD;  Location: [facility];  Service: Cardiovascular;  Laterality: Right;  ? ? ?Family History  ?Problem Relation Age of Onset  ? Heart disease Mother   ? Heart attack Father   ? Breast cancer Neg Hx   ? ? ?Social History:  reports that she has never smoked. She has never used smokeless tobacco. She reports that she does not drink alcohol and does not use drugs. The patient previously lived in [place], [place] for the past 40+ years.  She moved to [place] to be closer to her daughter. 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 ? ?Allergies:  ?Allergies  ?Allergen Reactions  ? Corticosteroids   ?  Headache  ? Other Nausea And Vomiting  ?  Seafood - cramping  ? Sulfa Antibiotics Anaphylaxis  ? Clarithromycin   ?  Rectal  bleeding ?  ? Cymbalta [Duloxetine Hcl] Diarrhea  ?  Abdominal cramping   ? Prednisone Other (See Comments)  ?  Color changes in face, headache and difficulty walking in high doses  ? ? ?Current Medications: ?Current Outpatient Medications  ?Medication Sig Dispense Refill  ? aspirin EC 81 MG EC tablet Take 1 tablet (81 mg total) by mouth daily. Swallow whole.    ? Bioflavonoid Products (ESTER C PO) Take 1,000 mg by mouth daily.    ? Calcium Citrate-Vitamin D (CITRACAL + D PO) Take 2 tablets by mouth daily.    ?  Cholecalciferol (VITAMIN D) 50 MCG (2000 UT) tablet Take 2,000 Units by mouth daily. Take with 1000 unit vitamin d to equal 3000 units daily    ? clopidogrel (PLAVIX) 75 MG tablet Take 1 tablet (75 mg total) by mouth daily. 30 tablet 5  ? desonide (DESOWEN) 0.05 % ointment Apply 1 application topically daily as needed (rash around eyelids).    ? fluticasone (FLONASE) 50 MCG/ACT nasal spray Place into both nostrils.    ? isosorbide mononitrate (IMDUR) 60 MG 24 hr tablet TAKE 1 TABLET BY MOUTH ONCE DAILY. 90 tablet 1  ? ISOSORBIDE MONONITRATE PO     ? losartan (COZAAR) 25 MG tablet Take 0.5 tablet by mouth daily. 45 tablet 1  ? metoprolol succinate (TOPROL-XL) 50 MG 24 hr tablet Take 1.5 tablets (75 mg total) by mouth daily. 135 tablet 3  ? Multiple Vitamins-Minerals (HAIR SKIN AND NAILS FORMULA PO) Take 2 capsules by mouth daily.    ? Multiple Vitamins-Minerals (ICAPS AREDS 2 PO) Take by mouth.    ? Multiple Vitamins-Minerals (MULTIVITAMIN WITH MINERALS) tablet Take 1 tablet by mouth daily.    ? Multiple Vitamins-Minerals (PRESERVISION AREDS PO) Take by mouth.    ? Naphazoline-Pheniramine (OPCON-A) 0.027-0.315 % SOLN Place 1 drop into both eyes daily.    ? omeprazole (PRILOSEC OTC) 20 MG tablet Take 20 mg by mouth daily.    ? rosuvastatin (CRESTOR) 20 MG tablet TAKE 1 TABLET BY MOUTH ONCE DAILY. 90 tablet 0  ? traMADol (ULTRAM) 50 MG tablet 1 tablet as needed    ? traMADol-acetaminophen (ULTRACET) 37.5-325 MG tablet Take 1 tablet by mouth 2 (two) times daily.    ? diphenhydrAMINE (SOMINEX) 25 MG tablet Take by mouth. (Patient not taking: Reported on 05/11/2021)    ? docusate sodium (COLACE) 100 MG capsule Take 100 mg by mouth daily. (Patient not taking: Reported on 05/11/2021)    ? nitroGLYCERIN (NITROSTAT) 0.4 MG SL tablet Place 1 tablet (0.4 mg total) under the tongue every 5 (five) minutes as needed for chest pain. Maximum of 3 doses. (Patient not taking: Reported on 05/11/2021) 25 tablet 1  ? ?No current  facility-administered medications for this visit.  ? ? ?Review of Systems  ?Constitutional:  Positive for malaise/fatigue. Negative for chills, fever and weight loss.  ?HENT:  Negative for congestion, ear pain and tinnitus.   ?Eyes: Negative.  Negative for blurred vision and double vision.  ?Respiratory: Negative.  Negative for cough, sputum production and shortness of breath.   ?Cardiovascular: Negative.  Negative for chest pain, palpitations and leg swelling.  ?Gastrointestinal: Negative.  Negative for abdominal pain, constipation, diarrhea, nausea and vomiting.  ?Genitourinary:  Negative for dysuria, frequency and urgency.  ?Musculoskeletal:  Negative for back pain and falls.  ?Skin: Negative.  Negative for rash.  ?Neurological: Negative.  Negative for weakness and headaches.  ?Endo/Heme/Allergies: Negative.  Does not bruise/bleed easily.  ?Psychiatric/Behavioral:  Negative.  Negative for depression. The patient is not nervous/anxious and does not have insomnia.    ? Performance status (ECOG): 0 ? ?Vitals ?Blood pressure 124/68, pulse 81, resp. rate 18, weight 139 lb (63 kg), SpO2 95 %.  ? ?Physical Exam ?Constitutional:   ?   Appearance: Normal appearance. She is normal weight.  ?Cardiovascular:  ?   Rate and Rhythm: Normal rate and regular rhythm.  ?Pulmonary:  ?   Effort: Pulmonary effort is normal.  ?Musculoskeletal:     ?   General: Normal range of motion.  ?Neurological:  ?   Mental Status: She is oriented to person, place, and time.  ?Psychiatric:     ?   Behavior: Behavior normal.  ? ? ?Appointment on 05/11/2021  ?Component Date Value Ref Range Status  ? WBC 05/11/2021 14.9 (H)  4.0 - 10.5 K/uL Final  ? RBC 05/11/2021 4.41  3.87 - 5.11 MIL/uL Final  ? Hemoglobin 05/11/2021 14.4  12.0 - 15.0 g/dL Final  ? HCT 05/11/2021 43.2  36.0 - 46.0 % Final  ? MCV 05/11/2021 98.0  80.0 - 100.0 fL Final  ? MCH 05/11/2021 32.7  26.0 - 34.0 pg Final  ? MCHC 05/11/2021 33.3  30.0 - 36.0 g/dL Final  ? RDW 05/11/2021 12.7   11.5 - 15.5 % Final  ? Platelets 05/11/2021 249  150 - 400 K/uL Final  ? nRBC 05/11/2021 0.0  0.0 - 0.2 % Final  ? Neutrophils Relative % 05/11/2021 26  % Final  ? Neutro Abs 05/11/2021 3.9  1.7 - 7.7 K/uL Final  ? [place]

## 2021-05-15 ENCOUNTER — Other Ambulatory Visit: Payer: Self-pay | Admitting: Cardiovascular Disease

## 2021-06-02 ENCOUNTER — Encounter: Payer: Self-pay | Admitting: Emergency Medicine

## 2021-07-22 ENCOUNTER — Ambulatory Visit (INDEPENDENT_AMBULATORY_CARE_PROVIDER_SITE_OTHER): Payer: Medicare Other | Admitting: Cardiovascular Disease

## 2021-07-22 ENCOUNTER — Encounter: Payer: Self-pay | Admitting: Cardiovascular Disease

## 2021-07-22 VITALS — BP 128/78 | HR 77 | Ht 62.0 in | Wt 139.4 lb

## 2021-07-22 DIAGNOSIS — Z8679 Personal history of other diseases of the circulatory system: Secondary | ICD-10-CM

## 2021-07-22 DIAGNOSIS — I25118 Atherosclerotic heart disease of native coronary artery with other forms of angina pectoris: Secondary | ICD-10-CM | POA: Diagnosis not present

## 2021-07-22 DIAGNOSIS — I10 Essential (primary) hypertension: Secondary | ICD-10-CM | POA: Diagnosis not present

## 2021-07-22 DIAGNOSIS — E785 Hyperlipidemia, unspecified: Secondary | ICD-10-CM | POA: Diagnosis not present

## 2021-07-22 NOTE — Patient Instructions (Signed)

## 2021-07-22 NOTE — Progress Notes (Signed)
Cardiology Office Note   Date:  07/22/2021   ID:  Cheryl Briggs, DOB July 05, 1945, MRN [id number]  PCP:  Vidal Schwalbe, MD  Cardiologist:   Kathlyn Sacramento, MD   Chief Complaint  Patient presents with   Other    6 Month fu c/o chest pain this am. Meds reviewed verbally with pt.      History of Present Illness: Cheryl Briggs is a 76 y.o. female who presents for a follow-up visit regarding coronary artery disease. She has known history of CLL, arthritis, secondhand smoking and hyperlipidemia. She presented in August 2021 with non-ST elevation myocardial infarction.  Cardiac catheterization showed severe three-vessel coronary artery disease with thrombotic 99% stenosis in the distal right coronary artery.  She underwent complex PCI to the distal RCA/right PDA with drug-eluting stent placement.  Procedure was complicated by no reflow with transient hypotension and bradycardia and subsequent atrial fibrillation with RVR.  Repeat cardiac catheterization few days later showed patent RCA stent with normal flow distally.  PCI of the distal left circumflex was performed.  She had recurrent unstable angina in February 2022.  Cardiac catheterization showed progression of mid RCA stenosis to 99%.  This was treated with drug-eluting stent placement.  She had recurrent chest pain and shortness of breath in June 2022 and thus she underwent repeat cardiac catheterization which showed widely patent stents with no significant restenosis, stable moderate proximal left circumflex stenosis with chronically occluded OM1 with collaterals and significant diagonal disease which was unchanged but relatively small branch.  Right heart catheterization showed normal filling pressures, normal pulmonary pressures and normal cardiac output.  Some of her shortness of breath was felt to be due to ticagrelor and was subsequently switched to clopidogrel.  Been doing reasonably well overall.  She has intermittent  episodes of chest pain but not very often.  She did have one episode last night when she was trying to get up from the recliner to go to her bed.  The pain lasted for about 10 minutes.  She has not had to use nitroglycerin.  She is exercising on a regular basis with no exertional symptoms.  Past Medical History:  Diagnosis Date   Allergy    Arthritis    Cancer ([place])    CLL    Past Surgical History:  Procedure Laterality Date   BACK SURGERY     BLADDER REPAIR     CARDIAC CATHETERIZATION     carpel tunnell Bilateral    CHOLECYSTECTOMY     COLONOSCOPY  2015   CORONARY STENT INTERVENTION N/A 08/16/2019   Procedure: CORONARY STENT INTERVENTION;  Surgeon: Nelva Bush, MD;  Location: [facility];  Service: Cardiovascular;  Laterality: N/A;   CORONARY STENT INTERVENTION Left 08/19/2019   Procedure: CORONARY STENT INTERVENTION;  Surgeon: Wellington Hampshire, MD;  Location: [facility];  Service: Cardiovascular;  Laterality: Left;   CORONARY STENT INTERVENTION N/A 02/25/2020   Procedure: CORONARY STENT INTERVENTION;  Surgeon: Nelva Bush, MD;  Location: [facility];  Service: Cardiovascular;  Laterality: N/A;   LEFT HEART CATH AND CORONARY ANGIOGRAPHY N/A 08/16/2019   Procedure: LEFT HEART CATH AND CORONARY ANGIOGRAPHY;  Surgeon: Nelva Bush, MD;  Location: [facility];  Service: Cardiovascular;  Laterality: N/A;   LEFT HEART CATH AND CORONARY ANGIOGRAPHY N/A 02/25/2020   Procedure: LEFT HEART CATH AND CORONARY ANGIOGRAPHY;  Surgeon: Nelva Bush, MD;  Location: [facility];  Service: Cardiovascular;  Laterality: N/A;   RIGHT/LEFT  HEART CATH AND CORONARY ANGIOGRAPHY N/A 06/18/2020   Procedure: RIGHT/LEFT HEART CATH AND CORONARY ANGIOGRAPHY;  Surgeon: Wellington Hampshire, MD;  Location: [facility];  Service: Cardiovascular;  Laterality: N/A;   SHOULDER SURGERY Bilateral    UPPER EXTREMITY ANGIOGRAPHY Right 08/21/2019   Procedure: UPPER  EXTREMITY ANGIOGRAPH;  Surgeon: Algernon Huxley, MD;  Location: [facility];  Service: Cardiovascular;  Laterality: Right;     Current Outpatient Medications  Medication Sig Dispense Refill   aspirin EC 81 MG EC tablet Take 1 tablet (81 mg total) by mouth daily. Swallow whole.     Bioflavonoid Products (ESTER C PO) Take 1,000 mg by mouth daily.     Calcium Citrate-Vitamin D (CITRACAL + D PO) Take 2 tablets by mouth daily.     Cholecalciferol (VITAMIN D) 50 MCG (2000 UT) tablet Take 2,000 Units by mouth daily. Take with 1000 unit vitamin d to equal 3000 units daily     clopidogrel (PLAVIX) 75 MG tablet Take 1 tablet (75 mg total) by mouth daily. 30 tablet 5   desonide (DESOWEN) 0.05 % ointment Apply 1 application topically daily as needed (rash around eyelids).     diphenhydrAMINE (SOMINEX) 25 MG tablet Take by mouth.     fluticasone (FLONASE) 50 MCG/ACT nasal spray Place into both nostrils.     isosorbide mononitrate (IMDUR) 60 MG 24 hr tablet TAKE 1 TABLET BY MOUTH ONCE DAILY. 90 tablet 1   losartan (COZAAR) 25 MG tablet Take 0.5 tablet by mouth daily. 45 tablet 1   metoprolol succinate (TOPROL-XL) 50 MG 24 hr tablet Take 1.5 tablets (75 mg total) by mouth daily. 135 tablet 3   Multiple Vitamins-Minerals (HAIR SKIN AND NAILS FORMULA PO) Take 2 capsules by mouth daily.     Multiple Vitamins-Minerals (MULTIVITAMIN WITH MINERALS) tablet Take 1 tablet by mouth daily.     Multiple Vitamins-Minerals (PRESERVISION AREDS PO) Take by mouth.     Naphazoline-Pheniramine (OPCON-A) 0.027-0.315 % SOLN Place 1 drop into both eyes daily.     nitroGLYCERIN (NITROSTAT) 0.4 MG SL tablet Place 1 tablet (0.4 mg total) under the tongue every 5 (five) minutes as needed for chest pain. Maximum of 3 doses. 25 tablet 1   omeprazole (PRILOSEC OTC) 20 MG tablet Take 20 mg by mouth daily.     rosuvastatin (CRESTOR) 20 MG tablet TAKE 1 TABLET BY MOUTH ONCE DAILY. 90 tablet 0   traMADol-acetaminophen (ULTRACET)  37.5-325 MG tablet Take 1 tablet by mouth 2 (two) times daily.     No current facility-administered medications for this visit.    Allergies:   Corticosteroids, Other, Sulfa antibiotics, Clarithromycin, Cymbalta [duloxetine hcl], and Prednisone    Social History:  The patient  reports that she has never smoked. She has never used smokeless tobacco. She reports that she does not drink alcohol and does not use drugs.   Family History:  The patient's family history includes Heart attack in her father; Heart disease in her mother.    ROS:  Please see the history of present illness.   Otherwise, review of systems are positive for none.   All other systems are reviewed and negative.    PHYSICAL EXAM: VS:  BP 128/78 (BP Location: Left Arm, Patient Position: Sitting, Cuff Size: Normal)   Pulse 77   Ht '5\' 2"'$  (1.575 m)   Wt 139 lb 6 oz (63.2 kg)   SpO2 98%   BMI 25.49 kg/m  , BMI Body mass index is  25.49 kg/m. GEN: Well nourished, well developed, in no acute distress  HEENT: normal  Neck: no JVD, carotid bruits, or masses Cardiac: RRR; no rubs, or gallops,no edema .  2/ 6 systolic murmur in the aortic area which is early peaking. Respiratory:  clear to auscultation bilaterally, normal work of breathing GI: soft, nontender, nondistended, + BS MS: no deformity or atrophy  Skin: warm and dry, no rash Neuro:  Strength and sensation are intact Psych: euthymic mood, full affect   EKG:  EKG is ordered today. The ekg ordered today demonstrates normal sinus rhythm with low voltage.  No significant ST or T wave changes.   Recent Labs: 05/11/2021: Hemoglobin 14.4; Platelets 249    Lipid Panel    Component Value Date/Time   CHOL 139 09/24/2019 1152   TRIG 138 09/24/2019 1152   HDL 61 09/24/2019 1152   CHOLHDL 2.3 09/24/2019 1152   CHOLHDL 2.7 08/15/2019 1024   VLDL 33 08/15/2019 1024   LDLCALC 54 09/24/2019 1152   LDLDIRECT 51 09/24/2019 1152      Wt Readings from Last 3  Encounters:  07/22/21 139 lb 6 oz (63.2 kg)  05/11/21 139 lb (63 kg)  01/21/21 137 lb (62.1 kg)           No data to display            ASSESSMENT AND PLAN:  1.  Coronary artery disease involving native coronary arteries with stable angina:  Most recent cardiac catheterization showed patent stents with stable small vessel disease.  Symptoms are reasonably controlled with medications.  She did have 1 episode of chest pain last night but did not require nitroglycerin.  Her EKG is unchanged today.  I asked her to notify us if she develops recurrent chest pain.  Overall she has been very active and she exercises with no exertional symptoms.  Continue dual antiplatelet therapy for now but we do have the option of stopping clopidogrel in the near future if needed.  2.  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  No evidence of recurrent atrial fibrillation and thus no need for anticoagulation for now.  3.  Essential hypertension: Blood pressures well controlled on current medications.  4.  Hyperlipidemia: Continue rosuvastatin 20 mg once daily.  Most recent lipid profile showed an LDL of 51.  I reviewed her most recent labs on her primary care physician which were unremarkable.  5.  Cardiac murmur: The murmur suggestive of aortic sclerosis.  Echocardiogram last year showed no significant stenosis.  We will plan on repeat echocardiogram in 1 to 2 years.    Disposition:   FU with me in 6 months  Signed,  Kathlyn Sacramento, MD  07/22/2021 9:46 AM    [place]

## 2021-08-16 ENCOUNTER — Other Ambulatory Visit: Payer: Self-pay

## 2021-08-16 MED ORDER — ROSUVASTATIN CALCIUM 20 MG PO TABS: 20.0000 mg | ORAL_TABLET | Freq: Every day | ORAL | 0 refills | Status: DC

## 2021-08-19 ENCOUNTER — Telehealth: Payer: Self-pay | Admitting: Cardiovascular Disease

## 2021-08-19 MED ORDER — CLOPIDOGREL BISULFATE 75 MG PO TABS: 75.0000 mg | ORAL_TABLET | Freq: Every day | ORAL | 1 refills | Status: DC

## 2021-08-19 NOTE — Telephone Encounter (Signed)
*  STAT* If patient is at the pharmacy, call can be transferred to refill team.   1. Which medications need to be refilled? (please list name of each medication and dose if known) Clopidogrel  2. Which pharmacy/location (including street and city if local pharmacy) is medication to be sent to? [facility]  3. Do they need a 30 day or 90 day supply? 90 days and refills- please call today- completely out of it

## 2021-09-17 ENCOUNTER — Telehealth: Payer: Self-pay | Admitting: Cardiovascular Disease

## 2021-09-17 MED ORDER — NITROGLYCERIN 0.4 MG SL SUBL
0.4000 mg | SUBLINGUAL_TABLET | SUBLINGUAL | 1 refills | Status: DC | PRN
Start: 2021-09-17 — End: 2023-03-27

## 2021-09-17 MED ORDER — LOSARTAN POTASSIUM 25 MG PO TABS: ORAL_TABLET | ORAL | 0 refills | Status: DC

## 2021-09-17 NOTE — Telephone Encounter (Signed)
*  STAT* If patient is at the pharmacy, call can be transferred to refill team.   1. Which medications need to be refilled? (please list name of each medication and dose if known) losartan (COZAAR) 25 MG tablet  nitroGLYCERIN (NITROSTAT) 0.4 MG SL tablet  2. Which pharmacy/location (including street and city if local pharmacy) is medication to be sent to? [place], [place]  3. Do they need a 30 day or 90 day supply? 90  Patient is out of medication

## 2021-09-17 NOTE — Telephone Encounter (Signed)
Requested Prescriptions   Signed Prescriptions Disp Refills   losartan (COZAAR) 25 MG tablet 45 tablet 0    Sig: Take 0.5 tablet by mouth daily.    Authorizing Provider: Kathlyn Sacramento A    Ordering User: Raelene Bott, Alyah Boehning L   nitroGLYCERIN (NITROSTAT) 0.4 MG SL tablet 25 tablet 1    Sig: Place 1 tablet (0.4 mg total) under the tongue every 5 (five) minutes as needed for chest pain. Maximum of 3 doses.    Authorizing Provider: Kathlyn Sacramento A    Ordering User: Raelene Bott, Taison Celani L

## 2021-10-22 ENCOUNTER — Other Ambulatory Visit: Payer: Self-pay | Admitting: Emergency Medicine

## 2021-10-22 DIAGNOSIS — Z1231 Encounter for screening mammogram for malignant neoplasm of breast: Secondary | ICD-10-CM

## 2021-10-26 ENCOUNTER — Telehealth: Payer: Self-pay | Admitting: Cardiovascular Disease

## 2021-10-26 MED ORDER — METOPROLOL SUCCINATE ER 50 MG PO TB24: 75.0000 mg | ORAL_TABLET | Freq: Every day | ORAL | 0 refills | Status: DC

## 2021-10-26 NOTE — Telephone Encounter (Signed)
*  STAT* If patient is at the pharmacy, call can be transferred to refill team.   1. Which medications need to be refilled? (please list name of each medication and dose if known) metoprolol succinate (TOPROL-XL) 50 MG 24 hr tablet (Expired)  2. Which pharmacy/location (including street and city if local pharmacy) is medication to be sent to? [place], [place]  3. Do they need a 30 day or 90 day supply? [place]

## 2021-11-04 ENCOUNTER — Telehealth: Payer: Self-pay | Admitting: Cardiovascular Disease

## 2021-11-04 MED ORDER — ISOSORBIDE MONONITRATE ER 60 MG PO TB24: 60.0000 mg | ORAL_TABLET | Freq: Every day | ORAL | 0 refills | Status: DC

## 2021-11-04 NOTE — Telephone Encounter (Signed)
*  STAT* If patient is at the pharmacy, call can be transferred to refill team.   1. Which medications need to be refilled? (please list name of each medication and dose if known)   isosorbide mononitrate (IMDUR) 60 MG 24 hr tablet    2. Which pharmacy/location (including street and city if local pharmacy) is medication to be sent to? [place], [place]  3. Do they need a 30 day or 90 day supply?  90 day

## 2021-11-09 ENCOUNTER — Ambulatory Visit: Payer: Medicare Other

## 2021-11-11 ENCOUNTER — Other Ambulatory Visit: Payer: Self-pay | Admitting: Cardiovascular Disease

## 2021-11-16 ENCOUNTER — Other Ambulatory Visit: Payer: Self-pay

## 2021-11-16 ENCOUNTER — Telehealth: Payer: Self-pay | Admitting: Cardiovascular Disease

## 2021-11-16 MED ORDER — ROSUVASTATIN CALCIUM 20 MG PO TABS: 20.0000 mg | ORAL_TABLET | Freq: Every day | ORAL | 3 refills | Status: DC

## 2021-11-16 NOTE — Telephone Encounter (Signed)
Disp Refills Start End   rosuvastatin (CRESTOR) 20 MG tablet 90 tablet 3 11/16/2021    Sig - Route: Take 1 tablet (20 mg total) by mouth daily. - Oral   Sent to pharmacy as: rosuvastatin (CRESTOR) 20 MG tablet   E-Prescribing Status: Transmission to pharmacy in progress (11/16/2021  1:10 PM EST)    [place], [place]

## 2021-11-16 NOTE — Telephone Encounter (Signed)
*  STAT* If patient is at the pharmacy, call can be transferred to refill team.   1. Which medications need to be refilled? (please list name of each medication and dose if known)  rosuvastatin (CRESTOR) 20 MG tablet  2. Which pharmacy/location (including street and city if local pharmacy) is medication to be sent to? [place], [place]   3. Do they need a 30 day or 90 day supply?  90 day supply  Patient states she is completely out of medication

## 2021-11-25 ENCOUNTER — Ambulatory Visit
Admission: RE | Admit: 2021-11-25 | Discharge: 2021-11-25 | Disposition: A | Discharge status: Home / Self Care | Payer: Medicare Other | Source: Ambulatory Visit | Attending: Emergency Medicine | Admitting: Emergency Medicine

## 2021-11-25 DIAGNOSIS — Z1231 Encounter for screening mammogram for malignant neoplasm of breast: Secondary | ICD-10-CM | POA: Diagnosis present

## 2021-12-09 ENCOUNTER — Other Ambulatory Visit: Payer: Self-pay | Admitting: Cardiovascular Disease

## 2022-01-12 ENCOUNTER — Telehealth: Payer: Self-pay | Admitting: Oncology

## 2022-01-12 NOTE — Telephone Encounter (Signed)
Cheryl Briggs called and said that Cheryl Briggs had asked her to call [facility] to get recent labs. They will not send w/o a signed release. I am mailing Cheryl Briggs a release to sign and she will mail it back. Her WBC is 15.7 and she wanted you to know that.

## 2022-01-26 ENCOUNTER — Other Ambulatory Visit: Payer: Self-pay | Admitting: Cardiovascular Disease

## 2022-02-01 ENCOUNTER — Encounter: Payer: Self-pay | Admitting: Emergency Medicine

## 2022-02-02 ENCOUNTER — Encounter: Payer: Self-pay | Admitting: Cardiovascular Disease

## 2022-02-02 ENCOUNTER — Ambulatory Visit: Payer: Medicare Other | Attending: Cardiovascular Disease | Admitting: Cardiovascular Disease

## 2022-02-02 VITALS — BP 104/60 | HR 88 | Ht 62.0 in | Wt 141.2 lb

## 2022-02-02 DIAGNOSIS — R011 Cardiac murmur, unspecified: Secondary | ICD-10-CM | POA: Diagnosis present

## 2022-02-02 DIAGNOSIS — I10 Essential (primary) hypertension: Secondary | ICD-10-CM | POA: Insufficient documentation

## 2022-02-02 DIAGNOSIS — E785 Hyperlipidemia, unspecified: Secondary | ICD-10-CM | POA: Diagnosis not present

## 2022-02-02 DIAGNOSIS — Z8679 Personal history of other diseases of the circulatory system: Secondary | ICD-10-CM | POA: Insufficient documentation

## 2022-02-02 DIAGNOSIS — I25118 Atherosclerotic heart disease of native coronary artery with other forms of angina pectoris: Secondary | ICD-10-CM | POA: Insufficient documentation

## 2022-02-02 MED ORDER — ISOSORBIDE MONONITRATE ER 60 MG PO TB24: 60.0000 mg | ORAL_TABLET | Freq: Every day | ORAL | 3 refills | Status: DC

## 2022-02-02 MED ORDER — ROSUVASTATIN CALCIUM 20 MG PO TABS: 20.0000 mg | ORAL_TABLET | Freq: Every day | ORAL | 3 refills | Status: DC

## 2022-02-02 MED ORDER — METOPROLOL SUCCINATE ER 50 MG PO TB24: ORAL_TABLET | ORAL | 3 refills | Status: DC

## 2022-02-02 MED ORDER — CLOPIDOGREL BISULFATE 75 MG PO TABS: 75.0000 mg | ORAL_TABLET | Freq: Every day | ORAL | 3 refills | Status: DC

## 2022-02-02 NOTE — Progress Notes (Unsigned)
Cardiology Office Note   Date:  02/02/2022   ID:  Cheryl Briggs, DOB 02/19/1945, MRN [id number]  PCP:  Vidal Schwalbe, MD  Cardiologist:   Kathlyn Sacramento, MD   Chief Complaint  Patient presents with   Other    6 Month fu no complaints today. Meds reviewed verbally with pt.      History of Present Illness: Cheryl Briggs is a 77 y.o. female who presents for a follow-up visit regarding coronary artery disease. She has known history of CLL, arthritis, secondhand smoking and hyperlipidemia. She presented in August 2021 with non-ST elevation myocardial infarction.  Cardiac catheterization showed severe three-vessel coronary artery disease with thrombotic 99% stenosis in the distal right coronary artery.  She underwent complex PCI to the distal RCA/right PDA with drug-eluting stent placement.  Procedure was complicated by no reflow with transient hypotension and bradycardia and subsequent atrial fibrillation with RVR.  Repeat cardiac catheterization few days later showed patent RCA stent with normal flow distally.  PCI of the distal left circumflex was performed.  She had recurrent unstable angina in February 2022.  Cardiac catheterization showed progression of mid RCA stenosis to 99%.  This was treated with drug-eluting stent placement.  She had recurrent chest pain and shortness of breath in June 2022 and thus she underwent repeat cardiac catheterization which showed widely patent stents with no significant restenosis, stable moderate proximal left circumflex stenosis with chronically occluded OM1 with collaterals and significant diagonal disease which was unchanged but relatively small branch.  Right heart catheterization showed normal filling pressures, normal pulmonary pressures and normal cardiac output.  Some of her shortness of breath was felt to be due to ticagrelor and was subsequently switched to clopidogrel.  She has been doing well overall with no anginal symptoms.  She  exercises regularly.  She has occasional random chest pain but episodes are usually short-lived.  She has not required nitroglycerin lately.  Past Medical History:  Diagnosis Date   Allergy    Arthritis    Cancer ([place])    CLL    Past Surgical History:  Procedure Laterality Date   BACK SURGERY     BLADDER REPAIR     CARDIAC CATHETERIZATION     carpel tunnell Bilateral    CHOLECYSTECTOMY     COLONOSCOPY  2015   CORONARY STENT INTERVENTION N/A 08/16/2019   Procedure: CORONARY STENT INTERVENTION;  Surgeon: Nelva Bush, MD;  Location: [facility];  Service: Cardiovascular;  Laterality: N/A;   CORONARY STENT INTERVENTION Left 08/19/2019   Procedure: CORONARY STENT INTERVENTION;  Surgeon: Wellington Hampshire, MD;  Location: [facility];  Service: Cardiovascular;  Laterality: Left;   CORONARY STENT INTERVENTION N/A 02/25/2020   Procedure: CORONARY STENT INTERVENTION;  Surgeon: Nelva Bush, MD;  Location: [facility];  Service: Cardiovascular;  Laterality: N/A;   LEFT HEART CATH AND CORONARY ANGIOGRAPHY N/A 08/16/2019   Procedure: LEFT HEART CATH AND CORONARY ANGIOGRAPHY;  Surgeon: Nelva Bush, MD;  Location: [facility];  Service: Cardiovascular;  Laterality: N/A;   LEFT HEART CATH AND CORONARY ANGIOGRAPHY N/A 02/25/2020   Procedure: LEFT HEART CATH AND CORONARY ANGIOGRAPHY;  Surgeon: Nelva Bush, MD;  Location: [facility];  Service: Cardiovascular;  Laterality: N/A;   RIGHT/LEFT HEART CATH AND CORONARY ANGIOGRAPHY N/A 06/18/2020   Procedure: RIGHT/LEFT HEART CATH AND CORONARY ANGIOGRAPHY;  Surgeon: Wellington Hampshire, MD;  Location: [facility];  Service: Cardiovascular;  Laterality: N/A;   SHOULDER  SURGERY Bilateral    UPPER EXTREMITY ANGIOGRAPHY Right 08/21/2019   Procedure: UPPER EXTREMITY ANGIOGRAPH;  Surgeon: Algernon Huxley, MD;  Location: [facility];  Service: Cardiovascular;  Laterality: Right;     Current  Outpatient Medications  Medication Sig Dispense Refill   aspirin EC 81 MG EC tablet Take 1 tablet (81 mg total) by mouth daily. Swallow whole.     azelastine (ASTELIN) 0.1 % nasal spray Place 1 spray into both nostrils 2 (two) times daily. Use in each nostril as directed     Bioflavonoid Products (ESTER C PO) Take 1,000 mg by mouth daily.     Calcium Citrate-Vitamin D (CITRACAL + D PO) Take 2 tablets by mouth daily.     Cholecalciferol (VITAMIN D) 50 MCG (2000 UT) tablet Take 2,000 Units by mouth daily. Take with 1000 unit vitamin d to equal 3000 units daily     clopidogrel (PLAVIX) 75 MG tablet Take 1 tablet (75 mg total) by mouth daily. 90 tablet 1   CRANBERRY PO Take by mouth 3 (three) times daily.     desonide (DESOWEN) 0.05 % ointment Apply 1 application topically daily as needed (rash around eyelids).     diphenhydrAMINE (SOMINEX) 25 MG tablet Take by mouth.     fluticasone (FLONASE) 50 MCG/ACT nasal spray Place into both nostrils.     isosorbide mononitrate (IMDUR) 60 MG 24 hr tablet Take 1 tablet (60 mg total) by mouth daily. 90 tablet 0   losartan (COZAAR) 25 MG tablet TAKE 1/2 TABLET BY MOUTH EVERY DAY 45 tablet 0   metoprolol succinate (TOPROL-XL) 50 MG 24 hr tablet TAKE ONE AND ONE-HALF TABLET BY MOUTH EVERY DAY 135 tablet 0   Multiple Vitamins-Minerals (MULTIVITAMIN WITH MINERALS) tablet Take 1 tablet by mouth daily.     Multiple Vitamins-Minerals (PRESERVISION AREDS PO) Take by mouth.     Naphazoline-Pheniramine (OPCON-A) 0.027-0.315 % SOLN Place 1 drop into both eyes daily.     nitroGLYCERIN (NITROSTAT) 0.4 MG SL tablet Place 1 tablet (0.4 mg total) under the tongue every 5 (five) minutes as needed for chest pain. Maximum of 3 doses. 25 tablet 1   omeprazole (PRILOSEC OTC) 20 MG tablet Take 20 mg by mouth daily.     rosuvastatin (CRESTOR) 20 MG tablet Take 1 tablet (20 mg total) by mouth daily. 90 tablet 3   traMADol-acetaminophen (ULTRACET) 37.5-325 MG tablet Take 1 tablet by  mouth 2 (two) times daily.     Multiple Vitamins-Minerals (HAIR SKIN AND NAILS FORMULA PO) Take 2 capsules by mouth daily. (Patient not taking: Reported on 02/02/2022)     No current facility-administered medications for this visit.    Allergies:   Corticosteroids, Other, Sulfa antibiotics, Clarithromycin, Cymbalta [duloxetine hcl], and Prednisone    Social History:  The patient  reports that she has never smoked. She has never used smokeless tobacco. She reports that she does not drink alcohol and does not use drugs.   Family History:  The patient's family history includes Heart attack in her father; Heart disease in her mother.    ROS:  Please see the history of present illness.   Otherwise, review of systems are positive for none.   All other systems are reviewed and negative.    PHYSICAL EXAM: VS:  BP 104/60 (BP Location: Left Arm, Patient Position: Sitting, Cuff Size: Normal)   Pulse 88   Ht '5\' 2"'$  (1.575 m)   Wt 141 lb 4 oz (64.1 kg)   SpO2  98%   BMI 25.83 kg/m  , BMI Body mass index is 25.83 kg/m. GEN: Well nourished, well developed, in no acute distress  HEENT: normal  Neck: no JVD, carotid bruits, or masses Cardiac: RRR; no rubs, or gallops,no edema .  2/ 6 systolic murmur in the aortic area which is early peaking. Respiratory:  clear to auscultation bilaterally, normal work of breathing GI: soft, nontender, nondistended, + BS MS: no deformity or atrophy  Skin: warm and dry, no rash Neuro:  Strength and sensation are intact Psych: euthymic mood, full affect   EKG:  EKG is ordered today. The ekg ordered today demonstrates normal sinus rhythm with low voltage.  No significant ST or T wave changes.   Recent Labs: 05/11/2021: Hemoglobin 14.4; Platelets 249    Lipid Panel    Component Value Date/Time   CHOL 139 09/24/2019 1152   TRIG 138 09/24/2019 1152   HDL 61 09/24/2019 1152   CHOLHDL 2.3 09/24/2019 1152   CHOLHDL 2.7 08/15/2019 1024   VLDL 33 08/15/2019 1024    LDLCALC 54 09/24/2019 1152   LDLDIRECT 51 09/24/2019 1152      Wt Readings from Last 3 Encounters:  02/02/22 141 lb 4 oz (64.1 kg)  07/22/21 139 lb 6 oz (63.2 kg)  05/11/21 139 lb (63 kg)           No data to display            ASSESSMENT AND PLAN:  1.  Coronary artery disease involving native coronary arteries with stable angina:  Most recent cardiac catheterization in June 2022 showed patent stents with stable small vessel disease.  Symptoms are reasonably controlled with medications.   Continue dual antiplatelet therapy for now given multiple stents.  2.  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  No evidence of recurrent atrial fibrillation and thus no need for anticoagulation for now.  3.  Essential hypertension: Her blood pressure has been running low and given that her ejection fraction is normal, she does not need to be on an ARB.  I discontinued small dose losartan.  4.  Hyperlipidemia: Continue rosuvastatin 20 mg once daily.  Her LDL is consistently below 70.  5.  Cardiac murmur: The murmur suggestive of aortic sclerosis.  Most recent echocardiogram in July 2022 showed calcified aortic valve without significant stenosis.  Will plan on repeat echocardiogram in 1 year.    Disposition:   FU with me in 6 months  Signed,  Kathlyn Sacramento, MD  02/02/2022 10:10 AM    [place]

## 2022-02-02 NOTE — Patient Instructions (Signed)
Medication Instructions:  STOP the Losartan  *If you need a refill on your cardiac medications before your next appointment, please call your pharmacy*   Lab Work: None ordered If you have labs (blood work) drawn today and your tests are completely normal, you will receive your results only by: [place] (if you have MyChart) OR A paper copy in the mail If you have any lab test that is abnormal or we need to change your treatment, we will call you to review the results.   Testing/Procedures: None ordered   Follow-Up: At [facility], you and your health needs are our priority.  As part of our continuing mission to provide you with exceptional heart care, we have created designated Provider Care Teams.  These Care Teams include your primary Cardiologist (physician) and Advanced Practice Providers (APPs -  Physician Assistants and Nurse Practitioners) who all work together to provide you with the care you need, when you need it.  We recommend signing up for the patient portal called "MyChart".  Sign up information is provided on this After Visit Summary.  MyChart is used to connect with patients for Virtual Visits (Telemedicine).  Patients are able to view lab/test results, encounter notes, upcoming appointments, etc.  Non-urgent messages can be sent to your provider as well.   To learn more about what you can do with MyChart, go to [email or web address].    Your next appointment:   6 month(s)  Provider:   You may see Kathlyn Sacramento, MD or one of the following Advanced Practice Providers on your designated Care Team:   Murray Hodgkins, NP Christell Faith, PA-C Cadence Kathlen Mody, PA-C Gerrie Nordmann, NP

## 2022-02-15 IMAGING — MG MM DIGITAL SCREENING BILAT W/ TOMO AND CAD
8 series · 8 of 24 positions shown · non-contrast
Comparison: Previous exam(s).

CLINICAL DATA: Screening.

EXAM:
DIGITAL SCREENING BILATERAL MAMMOGRAM WITH TOMOSYNTHESIS AND CAD
TECHNIQUE: Bilateral screening digital craniocaudal and mediolateral oblique
mammograms were obtained. Bilateral screening digital breast
tomosynthesis was performed. The images were evaluated with
computer-aided detection.

[L MLO synth-2D]
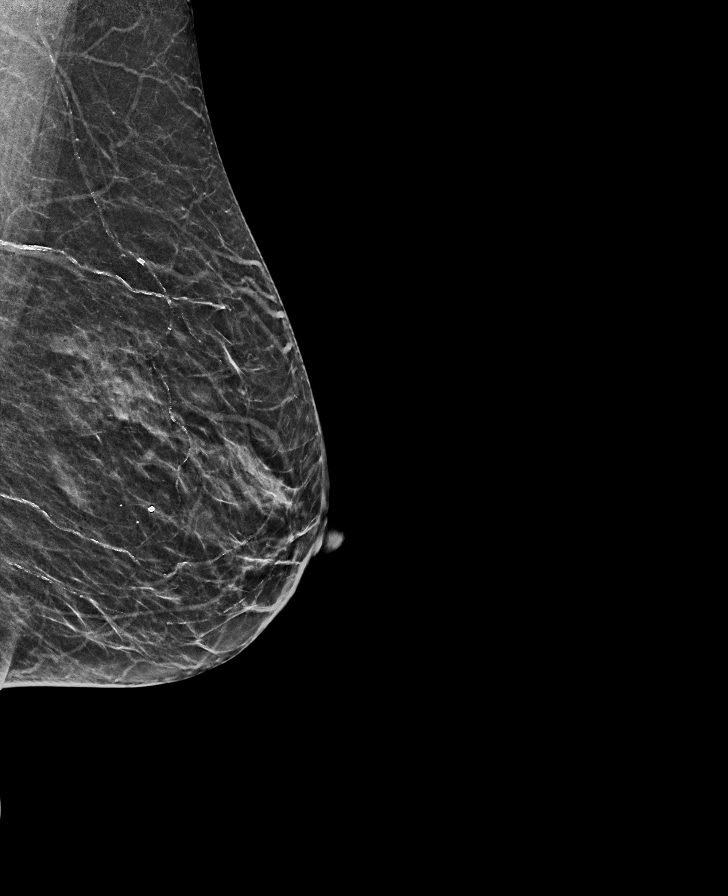

[L CC synth-2D]
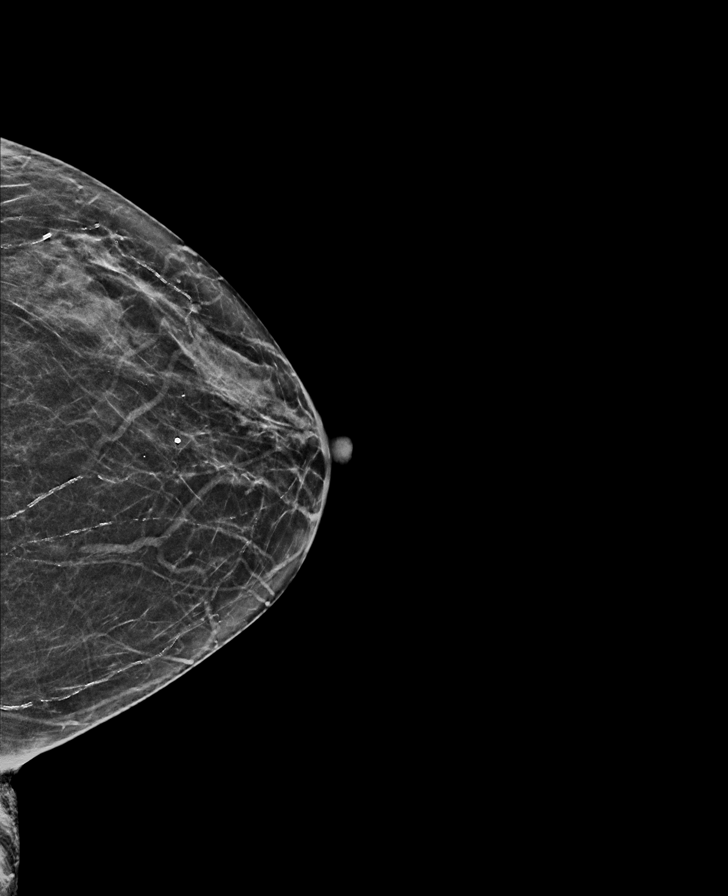

[R MLO synth-2D]
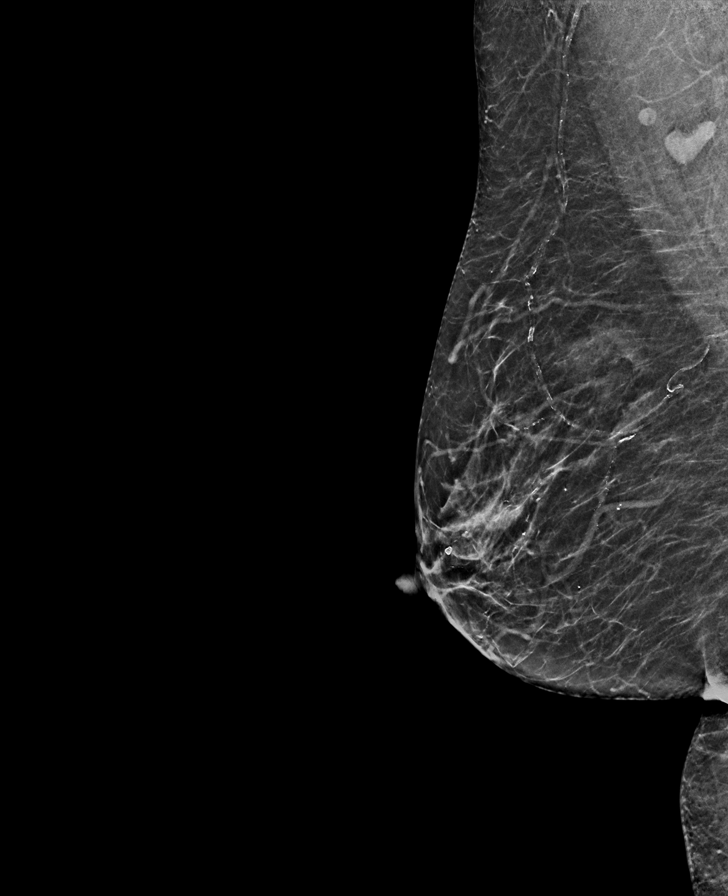

[R CC synth-2D]
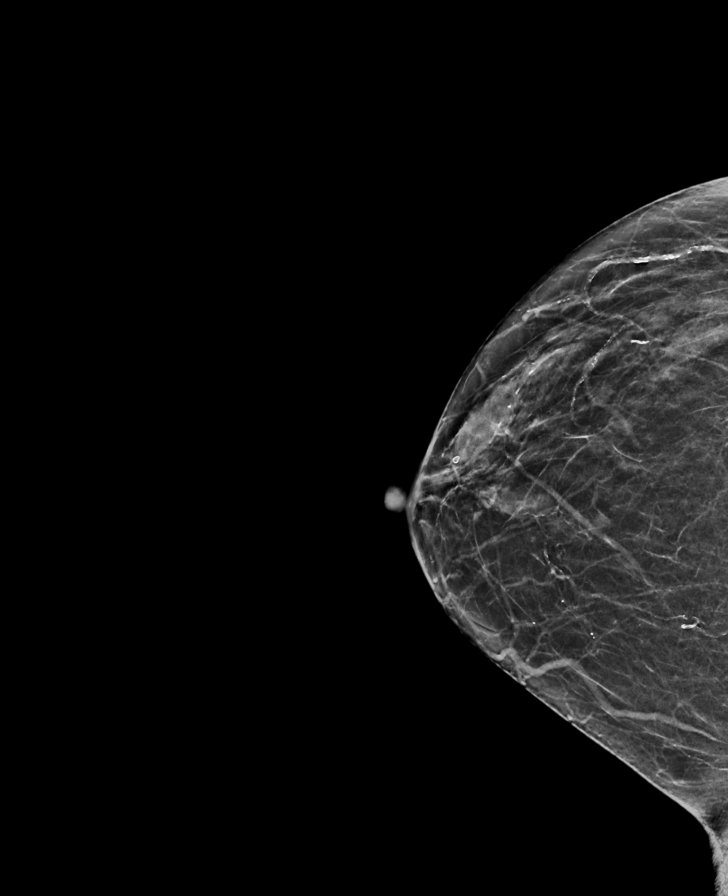

[L CC tomo · tomo slice 27/53.0]
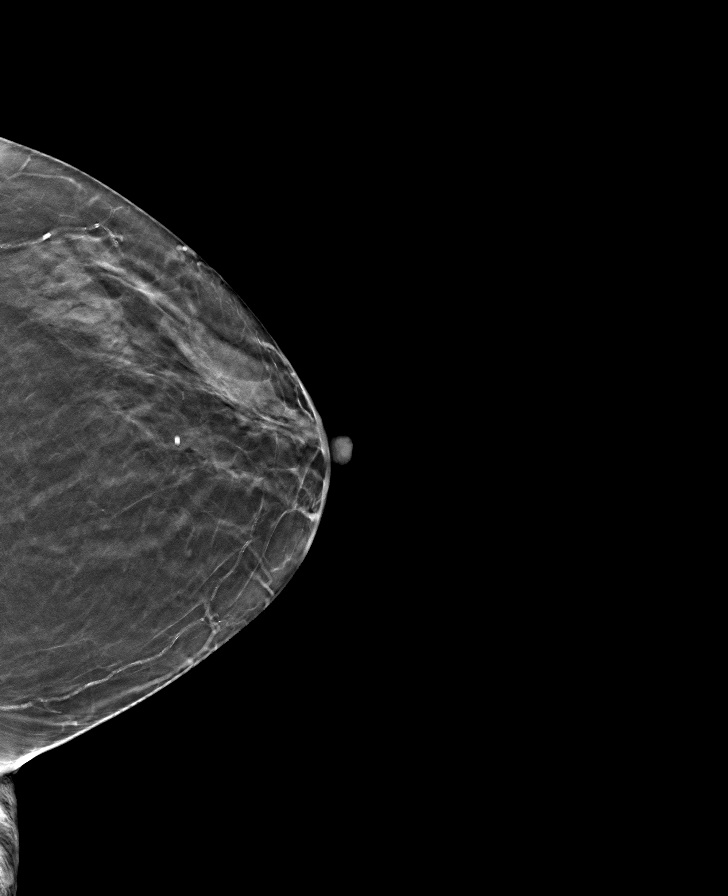

[L MLO tomo · tomo slice 25/50.0]
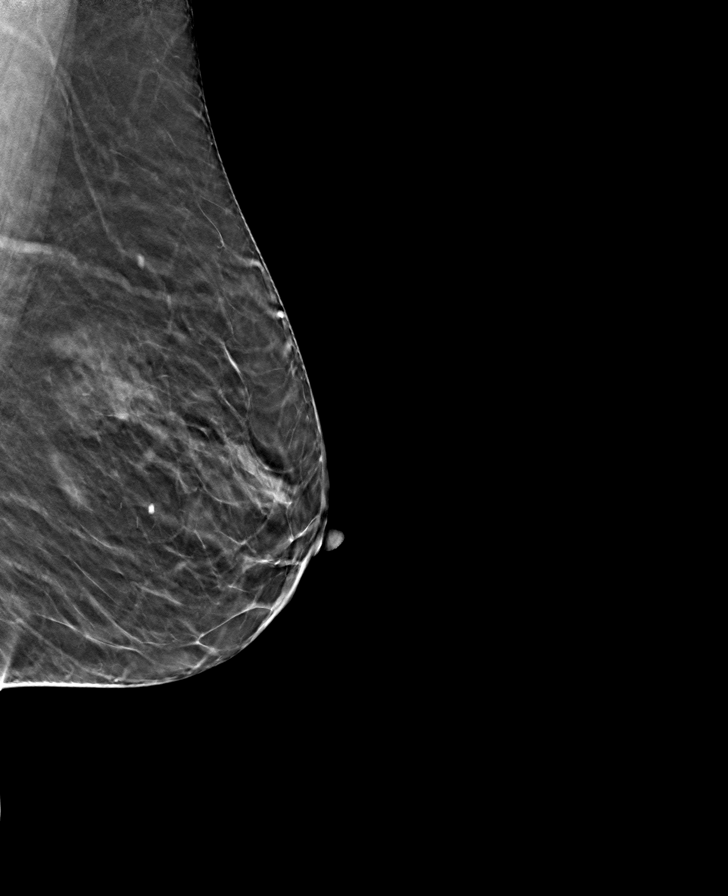

[R MLO tomo · tomo slice 27/53.0]
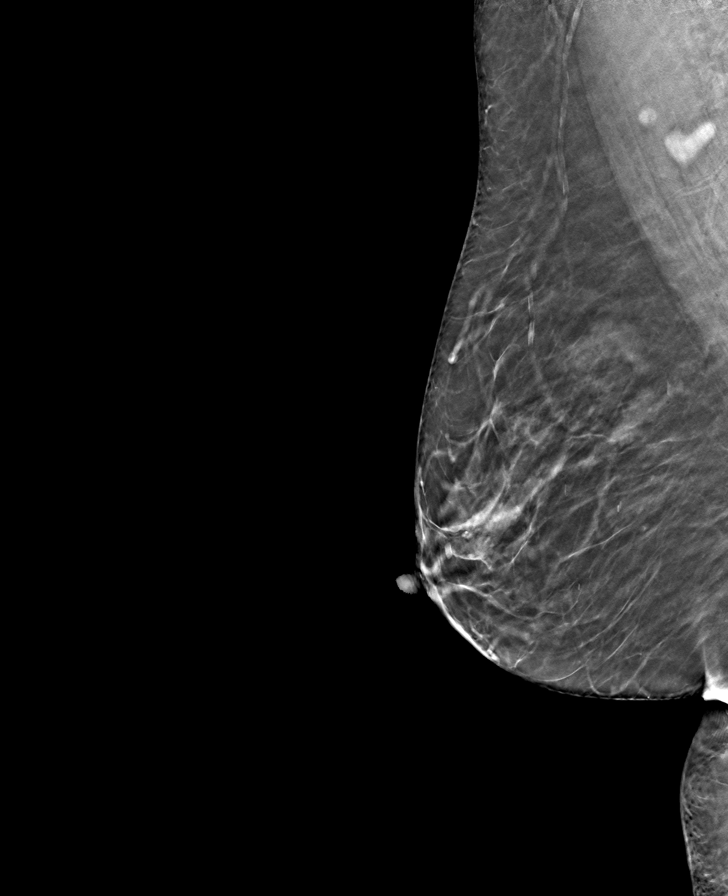

[R CC tomo · tomo slice 26/51.0]
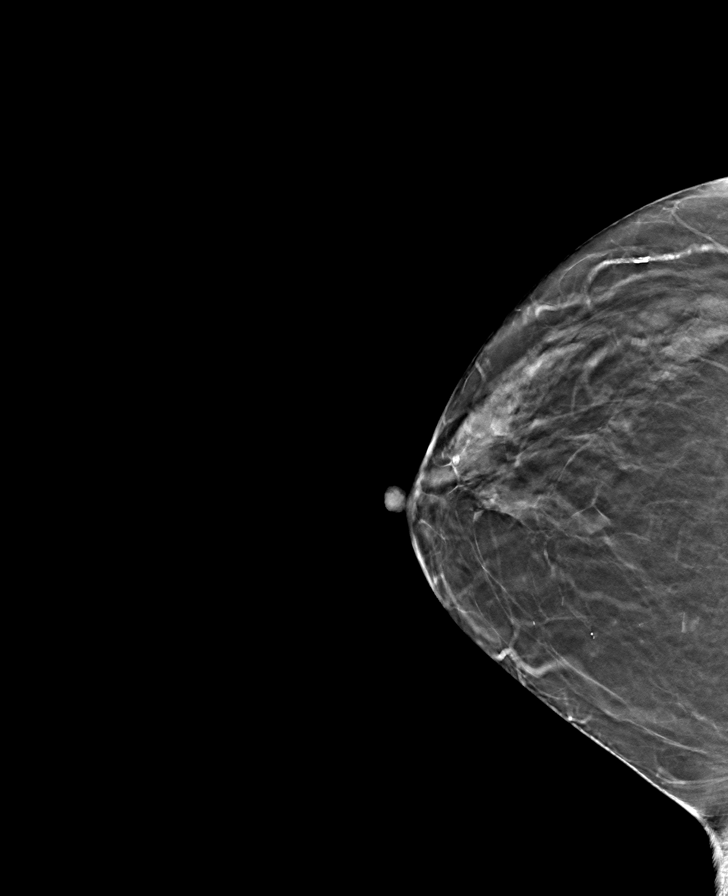

[8 of 24 positions shown; findings below may reference images not displayed]

ACR Breast Density Category b: There are scattered areas of
fibroglandular density.
FINDINGS: There are no findings suspicious for malignancy.
IMPRESSION: No mammographic evidence of malignancy. A result letter of this
screening mammogram will be mailed directly to the patient.

RECOMMENDATION:
Screening mammogram in one year. (Code:51-O-LD2)

BI-RADS CATEGORY  1: Negative.

## 2022-05-12 ENCOUNTER — Other Ambulatory Visit: Payer: Medicare Other

## 2022-05-12 ENCOUNTER — Ambulatory Visit: Payer: Medicare Other | Admitting: Nurse Practitioner

## 2022-05-12 ENCOUNTER — Other Ambulatory Visit: Payer: Self-pay

## 2022-05-12 ENCOUNTER — Inpatient Hospital Stay (HOSPITAL_BASED_OUTPATIENT_CLINIC_OR_DEPARTMENT_OTHER): Payer: Medicare Other | Admitting: Nurse Practitioner

## 2022-05-12 ENCOUNTER — Inpatient Hospital Stay: Payer: Medicare Other | Attending: Nurse Practitioner

## 2022-05-12 ENCOUNTER — Encounter: Payer: Self-pay | Admitting: Nurse Practitioner

## 2022-05-12 VITALS — BP 143/78 | HR 86 | Temp 96.9°F | Resp 20 | Ht 62.0 in | Wt 143.4 lb

## 2022-05-12 DIAGNOSIS — C9110 Chronic lymphocytic leukemia of B-cell type not having achieved remission: Secondary | ICD-10-CM | POA: Insufficient documentation

## 2022-05-12 LAB — COMPREHENSIVE METABOLIC PANEL
ALT: 21 U/L (ref 0–44)
AST: 33 U/L (ref 15–41)
Albumin: 4 g/dL (ref 3.5–5.0)
Alkaline Phosphatase: 81 U/L (ref 38–126)
Anion gap: 8 (ref 5–15)
BUN: 10 mg/dL (ref 8–23)
CO2: 26 mmol/L (ref 22–32)
Calcium: 9.2 mg/dL (ref 8.9–10.3)
Chloride: 103 mmol/L (ref 98–111)
Creatinine, Ser: 0.7 mg/dL (ref 0.44–1.00)
GFR, Estimated: >60 mL/min (ref >60)
Glucose, Bld: 120 mg/dL — ABNORMAL HIGH (ref 70–99)
Potassium: 4.2 mmol/L (ref 3.5–5.1)
Sodium: 137 mmol/L (ref 135–145)
Total Bilirubin: 0.6 mg/dL (ref 0.3–1.2)
Total Protein: 7 g/dL (ref 6.5–8.1)

## 2022-05-12 LAB — CBC WITH DIFFERENTIAL/PLATELET
Abs Immature Granulocytes: 0.03 10*3/uL (ref 0.00–0.07)
Basophils Absolute: 0.1 10*3/uL (ref 0.0–0.1)
Basophils Relative: 0 %
Eosinophils Absolute: 0.3 10*3/uL (ref 0.0–0.5)
Eosinophils Relative: 2 %
HCT: 43.6 % (ref 36.0–46.0)
Hemoglobin: 14.5 g/dL (ref 12.0–15.0)
Immature Granulocytes: 0 %
Lymphocytes Relative: 71 %
Lymphs Abs: 13.1 10*3/uL — ABNORMAL HIGH (ref 0.7–4.0)
MCH: 32.4 pg (ref 26.0–34.0)
MCHC: 33.3 g/dL (ref 30.0–36.0)
MCV: 97.3 fL (ref 80.0–100.0)
Monocytes Absolute: 1.2 10*3/uL — ABNORMAL HIGH (ref 0.1–1.0)
Monocytes Relative: 6 %
Neutro Abs: 3.9 10*3/uL (ref 1.7–7.7)
Neutrophils Relative %: 21 %
Platelets: 272 10*3/uL (ref 150–400)
RBC: 4.48 MIL/uL (ref 3.87–5.11)
RDW: 13 % (ref 11.5–15.5)
Smear Review: NORMAL
WBC: 18.5 10*3/uL — ABNORMAL HIGH (ref 4.0–10.5)
nRBC: 0 % (ref 0.0–0.2)

## 2022-05-12 NOTE — Progress Notes (Signed)
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  HPI: Patient initially presented as 77 year old female diagnosed with CLL in approximately 2015, transitioned care to Dr Merlene Pulling in 2019 after moving from [facility] to [facility]. Her WBC had been < 14,000 since diagnosis and clinically asymptomatic.   She is a retired Actor. Her daughter lives in [place].    Interval History- Patient is 77 year old female with above history of CLL who returns to clinic for one year assessment. She continues to feel well. Denies new lumps, fevers, chills, hot flashes, night sweats, or recurrent infections.    Past Medical History:  Diagnosis Date   Allergy    Arthritis    Cancer (HCC)    CLL    Past Surgical History:  Procedure Laterality Date   BACK SURGERY     BLADDER REPAIR     CARDIAC CATHETERIZATION     carpel tunnell Bilateral    CHOLECYSTECTOMY     COLONOSCOPY  2015   CORONARY STENT INTERVENTION N/A 08/16/2019   Procedure: CORONARY STENT INTERVENTION;  Surgeon: Yvonne Kendall, MD;  Location: [facility];  Service: Cardiovascular;  Laterality: N/A;   CORONARY STENT INTERVENTION Left 08/19/2019   Procedure: CORONARY STENT INTERVENTION;  Surgeon: Iran Ouch, MD;  Location: [facility];  Service: Cardiovascular;  Laterality: Left;   CORONARY STENT INTERVENTION N/A 02/25/2020   Procedure: CORONARY STENT INTERVENTION;  Surgeon: Yvonne Kendall, MD;  Location: [facility];  Service: Cardiovascular;  Laterality: N/A;   LEFT HEART CATH AND CORONARY ANGIOGRAPHY N/A 08/16/2019   Procedure: LEFT HEART CATH AND CORONARY ANGIOGRAPHY;  Surgeon: Yvonne Kendall, MD;  Location: [facility];  Service: Cardiovascular;  Laterality: N/A;   LEFT HEART CATH AND  CORONARY ANGIOGRAPHY N/A 02/25/2020   Procedure: LEFT HEART CATH AND CORONARY ANGIOGRAPHY;  Surgeon: Yvonne Kendall, MD;  Location: [facility];  Service: Cardiovascular;  Laterality: N/A;   RIGHT/LEFT HEART CATH AND CORONARY ANGIOGRAPHY N/A 06/18/2020   Procedure: RIGHT/LEFT HEART CATH AND CORONARY ANGIOGRAPHY;  Surgeon: Iran Ouch, MD;  Location: [facility];  Service: Cardiovascular;  Laterality: N/A;   SHOULDER SURGERY Bilateral    UPPER EXTREMITY ANGIOGRAPHY Right 08/21/2019   Procedure: UPPER EXTREMITY ANGIOGRAPH;  Surgeon: Annice Needy, MD;  Location: [facility];  Service: Cardiovascular;  Laterality: Right;    Family History  Problem Relation Age of Onset   Heart disease Mother    Heart attack Father    Breast cancer Neg Hx     Social History:  reports that she has never smoked. She has never used smokeless tobacco. She reports that she does not drink alcohol and does not use drugs. The patient previously lived in [place], [place] for the past 40+ years.  She moved to [place] to be closer to her daughter. Patient is a retired Licensed conveyancer.  Allergies:  Allergies  Allergen Reactions   Corticosteroids     Headache   Other Nausea And Vomiting    Seafood - cramping   Sulfa Antibiotics Anaphylaxis   Clarithromycin     Rectal bleeding    Cymbalta [Duloxetine Hcl] Diarrhea    Abdominal cramping    Prednisone Other (See Comments)  Color changes in face, headache and difficulty walking in high doses    Current Medications: Current Outpatient Medications  Medication Sig Dispense Refill   aspirin EC 81 MG EC tablet Take 1 tablet (81 mg total) by mouth daily. Swallow whole.     azelastine (ASTELIN) 0.1 % nasal spray Place 1 spray into both nostrils 2 (two) times daily. Use in each nostril as directed     Bioflavonoid Products (ESTER C PO) Take 1,000 mg by mouth daily.     Calcium Citrate-Vitamin D (CITRACAL + D PO) Take 2  tablets by mouth daily.     Cholecalciferol (VITAMIN D) 50 MCG (2000 UT) tablet Take 2,000 Units by mouth daily. Take with 1000 unit vitamin d to equal 3000 units daily     clopidogrel (PLAVIX) 75 MG tablet Take 1 tablet (75 mg total) by mouth daily. 90 tablet 3   CRANBERRY PO Take by mouth 3 (three) times daily.     desonide (DESOWEN) 0.05 % ointment Apply 1 application topically daily as needed (rash around eyelids).     diphenhydrAMINE (SOMINEX) 25 MG tablet Take by mouth.     fluticasone (FLONASE) 50 MCG/ACT nasal spray Place into both nostrils.     isosorbide mononitrate (IMDUR) 60 MG 24 hr tablet Take 1 tablet (60 mg total) by mouth daily. 90 tablet 3   metoprolol succinate (TOPROL-XL) 50 MG 24 hr tablet TAKE ONE AND ONE-HALF TABLET BY MOUTH EVERY DAY 135 tablet 3   Multiple Vitamins-Minerals (HAIR SKIN AND NAILS FORMULA PO) Take 2 capsules by mouth daily. (Patient not taking: Reported on 02/02/2022)     Multiple Vitamins-Minerals (MULTIVITAMIN WITH MINERALS) tablet Take 1 tablet by mouth daily.     Multiple Vitamins-Minerals (PRESERVISION AREDS PO) Take by mouth.     Naphazoline-Pheniramine (OPCON-A) 0.027-0.315 % SOLN Place 1 drop into both eyes daily.     nitroGLYCERIN (NITROSTAT) 0.4 MG SL tablet Place 1 tablet (0.4 mg total) under the tongue every 5 (five) minutes as needed for chest pain. Maximum of 3 doses. 25 tablet 1   omeprazole (PRILOSEC OTC) 20 MG tablet Take 20 mg by mouth daily.     rosuvastatin (CRESTOR) 20 MG tablet Take 1 tablet (20 mg total) by mouth daily. 90 tablet 3   traMADol-acetaminophen (ULTRACET) 37.5-325 MG tablet Take 1 tablet by mouth 2 (two) times daily.     No current facility-administered medications for this visit.    Review of Systems  Constitutional:  Negative for chills, fever, malaise/fatigue and weight loss.  HENT:  Negative for congestion, ear pain and tinnitus.   Eyes:  Negative for blurred vision and double vision.  Respiratory:  Negative for  cough, sputum production and shortness of breath.   Cardiovascular: Negative.  Negative for chest pain, palpitations and leg swelling.  Gastrointestinal:  Negative for abdominal pain, constipation, diarrhea, nausea and vomiting.  Genitourinary:  Negative for dysuria, frequency and urgency.  Musculoskeletal:  Negative for back pain and falls.  Skin:  Negative for itching and rash.  Neurological:  Negative for weakness and headaches.  Endo/Heme/Allergies:  Does not bruise/bleed easily.  Psychiatric/Behavioral:  Negative for depression. The patient is not nervous/anxious and does not have insomnia.      Performance status (ECOG): 0  Vitals Blood pressure (!) 143/78, pulse 86, temperature (!) 96.9 F (36.1 C), temperature source Tympanic, resp. rate 20, height 5\' 2"  (1.575 m), weight 143 lb 6.4 oz (65 kg).   Physical Exam Constitutional:  Appearance: Normal appearance. She is normal weight.  Cardiovascular:     Rate and Rhythm: Normal rate and regular rhythm.  Pulmonary:     Effort: Pulmonary effort is normal.  Musculoskeletal:        General: Normal range of motion.  Neurological:     Mental Status: She is oriented to person, place, and time.  Psychiatric:        Behavior: Behavior normal.       Latest Ref Rng & Units 05/12/2022    1:01 PM 05/11/2021    1:39 PM 06/05/2020   12:02 PM  CBC  WBC 4.0 - 10.5 K/uL 18.5  14.9  11.9   Hemoglobin 12.0 - 15.0 g/dL 16.1  09.6  04.5   Hematocrit 36.0 - 46.0 % 43.6  43.2  41.7   Platelets 150 - 400 K/uL 272  249  272       Latest Ref Rng & Units 05/12/2022    1:07 PM 06/05/2020   12:02 PM 03/16/2020   10:45 AM  CMP  Glucose 70 - 99 mg/dL 409  94  811   BUN 8 - 23 mg/dL 10  7  7    Creatinine 0.44 - 1.00 mg/dL 9.14  7.82  9.56   Sodium 135 - 145 mmol/L 137  137  138   Potassium 3.5 - 5.1 mmol/L 4.2  4.4  3.9   Chloride 98 - 111 mmol/L 103  101  104   CO2 22 - 32 mmol/L 26  21  23    Calcium 8.9 - 10.3 mg/dL 9.2  9.3  9.4   Total  Protein 6.5 - 8.1 g/dL 7.0     Total Bilirubin 0.3 - 1.2 mg/dL 0.6     Alkaline Phos 38 - 126 U/L 81     AST 15 - 41 U/L 33     ALT 0 - 44 U/L 21       Assessment & Plan: Cheryl Briggs is here for follow-up for CLL.    CLL- diagnosed in 32 in [place]. WBC has been < 14,000 since diagnosis. She has not required treatment or further workup. Clinically asymptomatic. Labs today reviewed. WBC has increased to 18.5 We reviewed indications for treatment including end organ damage, progressive bulky disease, progressive cytopenias, or significant disease related symptoms. No indications for treatment. Continue monitoring.  NSTEMI- 08/2019. followed by cardiology, Dr Kirke Corin. Health maintenance- managed by pcp.    Disposition: 1 year- lab (cbc, cmp), see me for CLL follow up- la  I spent 15 minutes dedicated to the care of this patient (face-to-face and non-face-to-face) on the date of the encounter to include what is described in the assessment and plan.  Thank you for allowing me to participate in the care of this very pleasant patient.   Consuello Masse, DNP, AGNP-C, [facility] at [facility] [phone] (clinic) 05/12/2022

## 2022-07-06 ENCOUNTER — Telehealth: Payer: Self-pay

## 2022-07-06 NOTE — Telephone Encounter (Signed)
Received a referral.

## 2022-07-07 ENCOUNTER — Telehealth: Payer: Self-pay

## 2022-07-07 NOTE — Telephone Encounter (Signed)
Pt left message to schedule colonoscopy please return call  

## 2022-07-07 NOTE — Telephone Encounter (Signed)
Referral received for patient to schedule her colonoscopy.  Due to patients age 77, and her cardiac history she will need to be scheduled an office visit before having her colonoscopy.   Voice message has been left asking patient to call office in re: referral.  Message forwarded to front office to contact patient to schedule office visit.

## 2022-07-21 ENCOUNTER — Other Ambulatory Visit: Payer: Self-pay | Admitting: Internal Medicine

## 2022-07-21 DIAGNOSIS — Z13820 Encounter for screening for osteoporosis: Secondary | ICD-10-CM

## 2022-07-27 ENCOUNTER — Encounter: Payer: Self-pay | Admitting: *Deleted

## 2022-08-04 ENCOUNTER — Ambulatory Visit: Payer: Medicare Other | Attending: Cardiovascular Disease | Admitting: Cardiovascular Disease

## 2022-08-04 ENCOUNTER — Encounter: Payer: Self-pay | Admitting: Cardiovascular Disease

## 2022-08-04 VITALS — BP 128/64 | HR 79 | Ht 61.0 in | Wt 145.1 lb

## 2022-08-04 DIAGNOSIS — E785 Hyperlipidemia, unspecified: Secondary | ICD-10-CM | POA: Insufficient documentation

## 2022-08-04 DIAGNOSIS — I10 Essential (primary) hypertension: Secondary | ICD-10-CM | POA: Diagnosis present

## 2022-08-04 DIAGNOSIS — I25118 Atherosclerotic heart disease of native coronary artery with other forms of angina pectoris: Secondary | ICD-10-CM | POA: Insufficient documentation

## 2022-08-04 DIAGNOSIS — I359 Nonrheumatic aortic valve disorder, unspecified: Secondary | ICD-10-CM | POA: Diagnosis present

## 2022-08-04 NOTE — Patient Instructions (Signed)
Medication Instructions:  No changes *If you need a refill on your cardiac medications before your next appointment, please call your pharmacy*   Lab Work: None ordered If you have labs (blood work) drawn today and your tests are completely normal, you will receive your results only by: [place] (if you have MyChart) OR A paper copy in the mail If you have any lab test that is abnormal or we need to change your treatment, we will call you to review the results.   Testing/Procedures: Your physician has requested that you have an echocardiogram in 6 months. Echocardiography is a painless test that uses sound waves to create images of your heart. It provides your doctor with information about the size and shape of your heart and how well your heart's chambers and valves are working.   You may receive an ultrasound enhancing agent through an IV if needed to better visualize your heart during the echo. This procedure takes approximately one hour.  There are no restrictions for this procedure.  This will take place at [id number], [place]    Follow-Up: At [facility], you and your health needs are our priority.  As part of our continuing mission to provide you with exceptional heart care, we have created designated Provider Care Teams.  These Care Teams include your primary Cardiologist (physician) and Advanced Practice Providers (APPs -  Physician Assistants and Nurse Practitioners) who all work together to provide you with the care you need, when you need it.  We recommend signing up for the patient portal called "MyChart".  Sign up information is provided on this After Visit Summary.  MyChart is used to connect with patients for Virtual Visits (Telemedicine).  Patients are able to view lab/test results, encounter notes, upcoming appointments, etc.  Non-urgent messages can be sent to your provider as well.   To learn more about  what you can do with MyChart, go to [email or web address].    Your next appointment:   6 month(s)  Provider:   You may see Kathlyn Sacramento, MD or one of the following Advanced Practice Providers on your designated Care Team:   Murray Hodgkins, NP Christell Faith, PA-C Cadence Kathlen Mody, PA-C Gerrie Nordmann, NP

## 2022-08-04 NOTE — Progress Notes (Signed)
Cardiology Office Note   Date:  08/04/2022   ID:  Cheryl Briggs, DOB 29-Jan-1945, MRN [id number]  PCP:  Alvina Filbert, MD  Cardiologist:   Lorine Bears, MD   Chief Complaint  Patient presents with   Follow-up    6 month f/u no complaints today. Meds reviewed verbally with pt.      History of Present Illness: Cheryl Briggs is a 77 y.o. female who presents for a follow-up visit regarding coronary artery disease. She has known history of CLL, arthritis, secondhand smoking and hyperlipidemia. She presented in August 2021 with non-ST elevation myocardial infarction.  Cardiac catheterization showed severe three-vessel coronary artery disease with thrombotic 99% stenosis in the distal right coronary artery.  She underwent complex PCI to the distal RCA/right PDA with drug-eluting stent placement.  Procedure was complicated by no reflow with transient hypotension and bradycardia and subsequent atrial fibrillation with RVR.  Repeat cardiac catheterization few days later showed patent RCA stent with normal flow distally.  PCI of the distal left circumflex was performed.  She had recurrent unstable angina in February 2022.  Cardiac catheterization showed progression of mid RCA stenosis to 99%.  This was treated with drug-eluting stent placement.  She had recurrent chest pain and shortness of breath in June 2022 and thus she underwent repeat cardiac catheterization which showed widely patent stents with no significant restenosis, stable moderate proximal left circumflex stenosis with chronically occluded OM1 with collaterals and significant diagonal disease which was unchanged but relatively small branch.  Right heart catheterization showed normal filling pressures, normal pulmonary pressures and normal cardiac output.  Some of her shortness of breath was felt to be due to ticagrelor and was subsequently switched to clopidogrel.  She had an episode of chest pain in June that lasted about 10  minutes and responded to sublingual nitroglycerin.  No recurrent symptoms since that time and she has been doing well.  Past Medical History:  Diagnosis Date   Allergy    Arthritis    Cancer (HCC)    CLL    Past Surgical History:  Procedure Laterality Date   BACK SURGERY     BLADDER REPAIR     CARDIAC CATHETERIZATION     carpel tunnell Bilateral    CHOLECYSTECTOMY     COLONOSCOPY  2015   CORONARY STENT INTERVENTION N/A 08/16/2019   Procedure: CORONARY STENT INTERVENTION;  Surgeon: Yvonne Kendall, MD;  Location: [facility];  Service: Cardiovascular;  Laterality: N/A;   CORONARY STENT INTERVENTION Left 08/19/2019   Procedure: CORONARY STENT INTERVENTION;  Surgeon: Iran Ouch, MD;  Location: [facility];  Service: Cardiovascular;  Laterality: Left;   CORONARY STENT INTERVENTION N/A 02/25/2020   Procedure: CORONARY STENT INTERVENTION;  Surgeon: Yvonne Kendall, MD;  Location: [facility];  Service: Cardiovascular;  Laterality: N/A;   LEFT HEART CATH AND CORONARY ANGIOGRAPHY N/A 08/16/2019   Procedure: LEFT HEART CATH AND CORONARY ANGIOGRAPHY;  Surgeon: Yvonne Kendall, MD;  Location: [facility];  Service: Cardiovascular;  Laterality: N/A;   LEFT HEART CATH AND CORONARY ANGIOGRAPHY N/A 02/25/2020   Procedure: LEFT HEART CATH AND CORONARY ANGIOGRAPHY;  Surgeon: Yvonne Kendall, MD;  Location: [facility];  Service: Cardiovascular;  Laterality: N/A;   RIGHT/LEFT HEART CATH AND CORONARY ANGIOGRAPHY N/A 06/18/2020   Procedure: RIGHT/LEFT HEART CATH AND CORONARY ANGIOGRAPHY;  Surgeon: Iran Ouch, MD;  Location: [facility];  Service: Cardiovascular;  Laterality: N/A;   SHOULDER SURGERY  Bilateral    UPPER EXTREMITY ANGIOGRAPHY Right 08/21/2019   Procedure: UPPER EXTREMITY ANGIOGRAPH;  Surgeon: Annice Needy, MD;  Location: [facility];  Service: Cardiovascular;  Laterality: Right;     Current Outpatient Medications   Medication Sig Dispense Refill   aspirin EC 81 MG EC tablet Take 1 tablet (81 mg total) by mouth daily. Swallow whole.     azelastine (ASTELIN) 0.1 % nasal spray Place 1 spray into both nostrils 2 (two) times daily. Use in each nostril as directed     Calcium Citrate-Vitamin D (CITRACAL + D PO) Take 2 tablets by mouth daily.     Cholecalciferol (VITAMIN D) 50 MCG (2000 UT) tablet Take 2,000 Units by mouth daily. Take with 1000 unit vitamin d to equal 3000 units daily     clopidogrel (PLAVIX) 75 MG tablet Take 1 tablet (75 mg total) by mouth daily. 90 tablet 3   CRANBERRY PO Take by mouth daily at 8 pm.     desonide (DESOWEN) 0.05 % ointment Apply 1 application topically daily as needed (rash around eyelids).     diphenhydrAMINE (SOMINEX) 25 MG tablet Take by mouth at bedtime as needed.     fluticasone (FLONASE) 50 MCG/ACT nasal spray Place into both nostrils.     isosorbide mononitrate (IMDUR) 60 MG 24 hr tablet Take 1 tablet (60 mg total) by mouth daily. 90 tablet 3   metoprolol succinate (TOPROL-XL) 50 MG 24 hr tablet TAKE ONE AND ONE-HALF TABLET BY MOUTH EVERY DAY 135 tablet 3   Multiple Vitamins-Minerals (HAIR SKIN AND NAILS FORMULA PO) Take 2 capsules by mouth daily.     Multiple Vitamins-Minerals (MULTIVITAMIN WITH MINERALS) tablet Take 1 tablet by mouth daily.     Multiple Vitamins-Minerals (PRESERVISION AREDS PO) Take by mouth.     Naphazoline-Pheniramine (OPCON-A) 0.027-0.315 % SOLN Place 1 drop into both eyes daily.     nitroGLYCERIN (NITROSTAT) 0.4 MG SL tablet Place 1 tablet (0.4 mg total) under the tongue every 5 (five) minutes as needed for chest pain. Maximum of 3 doses. 25 tablet 1   omeprazole (PRILOSEC OTC) 20 MG tablet Take 20 mg by mouth daily.     rosuvastatin (CRESTOR) 20 MG tablet Take 1 tablet (20 mg total) by mouth daily. 90 tablet 3   traMADol-acetaminophen (ULTRACET) 37.5-325 MG tablet Take 1 tablet by mouth 2 (two) times daily.     omega-3 acid ethyl esters (LOVAZA)  1 g capsule Take 1 g by mouth daily. (Patient not taking: Reported on 08/04/2022)     No current facility-administered medications for this visit.    Allergies:   Corticosteroids, Other, Sulfa antibiotics, Clarithromycin, Cymbalta [duloxetine hcl], and Prednisone    Social History:  The patient  reports that she has never smoked. She has never used smokeless tobacco. She reports that she does not drink alcohol and does not use drugs.   Family History:  The patient's family history includes Heart attack in her father; Heart disease in her mother.    ROS:  Please see the history of present illness.   Otherwise, review of systems are positive for none.   All other systems are reviewed and negative.    PHYSICAL EXAM: VS:  BP 128/64 (BP Location: Left Arm, Patient Position: Sitting, Cuff Size: Normal)   Pulse 79   Ht 5\' 1"  (1.549 m)   Wt 145 lb 2 oz (65.8 kg)   SpO2 95%   BMI 27.42 kg/m  , BMI Body mass  index is 27.42 kg/m. GEN: Well nourished, well developed, in no acute distress  HEENT: normal  Neck: no JVD, carotid bruits, or masses Cardiac: RRR; no rubs, or gallops,no edema .  2/ 6 systolic murmur in the aortic area which is early peaking. Respiratory:  clear to auscultation bilaterally, normal work of breathing GI: soft, nontender, nondistended, + BS MS: no deformity or atrophy  Skin: warm and dry, no rash Neuro:  Strength and sensation are intact Psych: euthymic mood, full affect   EKG:  EKG is ordered today. The ekg ordered today demonstrates normal sinus rhythm with low voltage.  No significant ST or T wave changes.   Recent Labs: 05/12/2022: ALT 21; BUN 10; Creatinine, Ser 0.70; Hemoglobin 14.5; Platelets 272; Potassium 4.2; Sodium 137    Lipid Panel    Component Value Date/Time   CHOL 139 09/24/2019 1152   TRIG 138 09/24/2019 1152   HDL 61 09/24/2019 1152   CHOLHDL 2.3 09/24/2019 1152   CHOLHDL 2.7 08/15/2019 1024   VLDL 33 08/15/2019 1024   LDLCALC 54  09/24/2019 1152   LDLDIRECT 51 09/24/2019 1152      Wt Readings from Last 3 Encounters:  08/04/22 145 lb 2 oz (65.8 kg)  05/12/22 143 lb 6.4 oz (65 kg)  02/02/22 141 lb 4 oz (64.1 kg)           No data to display            ASSESSMENT AND PLAN:  1.  Coronary artery disease involving native coronary arteries with stable angina:  Most recent cardiac catheterization in June 2022 showed patent stents with stable small vessel disease.  Symptoms are reasonably controlled with medications.   Continue dual antiplatelet therapy for now given multiple stents.  2.  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  No evidence of recurrent atrial fibrillation and thus no need for anticoagulation for now.  3.  Essential hypertension: Losartan was discontinued during last visit due to low blood pressure.  Her blood pressure is normal today.  4.  Hyperlipidemia: Continue rosuvastatin 20 mg once daily.  Her LDL is consistently below 70.  5.  Cardiac murmur: The murmur suggestive of aortic sclerosis.  Most recent echocardiogram in July 2022 showed calcified aortic valve without significant stenosis.  I suspect that she has mild degree of aortic stenosis.  I requested a follow-up echocardiogram to be done in 6 months from now.    Disposition:   FU with me in 6 months  Signed,  Lorine Bears, MD  08/04/2022 12:12 PM    [facility]

## 2022-08-19 ENCOUNTER — Other Ambulatory Visit: Payer: Self-pay

## 2022-08-19 DIAGNOSIS — I219 Acute myocardial infarction, unspecified: Secondary | ICD-10-CM | POA: Insufficient documentation

## 2022-08-22 NOTE — Progress Notes (Signed)
Celso Amy, PA-C [address]  [address]  [place], [place] [address]  Main: [phone]  Fax: [phone]   Gastroenterology Consultation  Referring Provider:     Smith Robert, MD Primary Care Physician:  Alvina Filbert, MD Primary Gastroenterologist:  Celso Amy, PA-C / Dr. Wyline Mood   Reason for Consultation:     Repeat colonoscopy        HPI:   Cheryl Briggs is a 77 y.o. y/o female referred for consultation & management  by Alvina Filbert, MD at [facility].  She is referred to schedule a repeat screening colonoscopy.  Patient states she has only had 1 screening colonoscopy done in the fall 2013 in [place] which was normal.  She was told to repeat screening colonoscopy in 10 years.  She is requesting to schedule a repeat screening colonoscopy.  She denies abdominal pain, rectal bleeding, or unintentional weight loss.  She has chronic constipation for many years.  She eats prunes, and takes fiber supplement with mild benefit.  She could not tolerate MiraLAX which caused abdominal cramping and nausea.  Has history of GERD which is controlled on omeprazole 20 Mg daily.  Denies dysphagia.  Medical history significant for CAD, NSTEMI 10/2019 with stents placed, aortic stenosis, hypertension, CLL, arthritis, hyperlipidemia.  Currently on aspirin and Plavix.  Last saw Dr. Kirke Corin cardiologist 08/04/2022, stable.  Echocardiogram in 2022 showed LVEF 55 to 60%.  Diagnosed with CLL in 2015, clinically asymptomatic.  Last saw oncology 05/2022, stable.  She denies any chest pain, shortness of breath, or cardiac symptoms.  Past Medical History:  Diagnosis Date   Allergy    Arthritis    Cancer (HCC)    CLL    Past Surgical History:  Procedure Laterality Date   BACK SURGERY     BLADDER REPAIR     CARDIAC CATHETERIZATION     carpel tunnell Bilateral    CHOLECYSTECTOMY     COLONOSCOPY  2015   CORONARY STENT INTERVENTION N/A 08/16/2019   Procedure:  CORONARY STENT INTERVENTION;  Surgeon: Yvonne Kendall, MD;  Location: [facility];  Service: Cardiovascular;  Laterality: N/A;   CORONARY STENT INTERVENTION Left 08/19/2019   Procedure: CORONARY STENT INTERVENTION;  Surgeon: Iran Ouch, MD;  Location: [facility];  Service: Cardiovascular;  Laterality: Left;   CORONARY STENT INTERVENTION N/A 02/25/2020   Procedure: CORONARY STENT INTERVENTION;  Surgeon: Yvonne Kendall, MD;  Location: [facility];  Service: Cardiovascular;  Laterality: N/A;   LEFT HEART CATH AND CORONARY ANGIOGRAPHY N/A 08/16/2019   Procedure: LEFT HEART CATH AND CORONARY ANGIOGRAPHY;  Surgeon: Yvonne Kendall, MD;  Location: [facility];  Service: Cardiovascular;  Laterality: N/A;   LEFT HEART CATH AND CORONARY ANGIOGRAPHY N/A 02/25/2020   Procedure: LEFT HEART CATH AND CORONARY ANGIOGRAPHY;  Surgeon: Yvonne Kendall, MD;  Location: [facility];  Service: Cardiovascular;  Laterality: N/A;   RIGHT/LEFT HEART CATH AND CORONARY ANGIOGRAPHY N/A 06/18/2020   Procedure: RIGHT/LEFT HEART CATH AND CORONARY ANGIOGRAPHY;  Surgeon: Iran Ouch, MD;  Location: [facility];  Service: Cardiovascular;  Laterality: N/A;   SHOULDER SURGERY Bilateral    UPPER EXTREMITY ANGIOGRAPHY Right 08/21/2019   Procedure: UPPER EXTREMITY ANGIOGRAPH;  Surgeon: Annice Needy, MD;  Location: [facility];  Service: Cardiovascular;  Laterality: Right;    Prior to Admission medications   Medication Sig Start Date End Date Taking? Authorizing Provider  azelastine (ASTELIN) 0.1 %  nasal spray Place 1 spray into both nostrils 2 (two) times daily. Use in each nostril as directed    [provider]  Biotin 5000 MCG TABS 1 tablet Orally Once a day    [provider]  Cholecalciferol (VITAMIN D) 50 MCG (2000 UT) tablet Take 2,000 Units by mouth daily. Take with 1000 unit vitamin d to equal 3000 units daily    [provider]   CRANBERRY PO Take by mouth daily at 8 pm.    [provider]  desonide (DESOWEN) 0.05 % ointment Apply 1 application topically daily as needed (rash around eyelids). 02/21/17   [provider]  diphenhydrAMINE (SOMINEX) 25 MG tablet Take by mouth at bedtime as needed.    [provider]  fluticasone (FLONASE) 50 MCG/ACT nasal spray Place into both nostrils. 05/05/21   [provider]  isosorbide mononitrate (IMDUR) 60 MG 24 hr tablet Take 1 tablet (60 mg total) by mouth daily. 02/02/22   Iran Ouch, MD  metoprolol succinate (TOPROL-XL) 50 MG 24 hr tablet TAKE ONE AND ONE-HALF TABLET BY MOUTH EVERY DAY 02/02/22   Iran Ouch, MD  Naphazoline-Pheniramine (OPCON-A) 0.027-0.315 % SOLN Place 1 drop into both eyes daily.    [provider]  nitroGLYCERIN (NITROSTAT) 0.4 MG SL tablet Place 1 tablet (0.4 mg total) under the tongue every 5 (five) minutes as needed for chest pain. Maximum of 3 doses. 09/17/21   Iran Ouch, MD  omega-3 acid ethyl esters (LOVAZA) 1 g capsule Take 1 g by mouth daily. Patient not taking: Reported on 08/04/2022    [provider]  omeprazole (PRILOSEC OTC) 20 MG tablet Take 20 mg by mouth daily.    [provider]  rosuvastatin (CRESTOR) 20 MG tablet Take 1 tablet (20 mg total) by mouth daily. 02/02/22   Iran Ouch, MD  traMADol-acetaminophen (ULTRACET) 37.5-325 MG tablet Take 1 tablet by mouth 2 (two) times daily. 09/23/19   [provider]    Family History  Problem Relation Age of Onset   Heart disease Mother    Heart attack Father    Breast cancer Neg Hx      Social History   Tobacco Use   Smoking status: Never   Smokeless tobacco: Never  Vaping Use   Vaping status: Never Used  Substance Use Topics   Alcohol use: No   Drug use: No    Allergies as of 08/23/2022 - Review Complete 08/23/2022  Allergen Reaction Noted   Corticosteroids  02/28/2018   Other Nausea And  Vomiting 04/29/2019   Sulfa antibiotics Anaphylaxis 10/06/2017   Clarithromycin  06/14/2017   Cymbalta [duloxetine hcl] Diarrhea 10/06/2017   Fish oil Other (See Comments) 08/19/2022   Prednisone Other (See Comments) 10/06/2017   Shellfish allergy  08/19/2022    Review of Systems:    All systems reviewed and negative except where noted in HPI.   Physical Exam:  BP 116/74   Pulse 80   Temp 98.1 F (36.7 C)   Ht 5\' 1"  (1.549 m)   Wt 144 lb (65.3 kg)   BMI 27.21 kg/m  No LMP recorded. Patient is postmenopausal.  General:   Alert,  Well-developed, well-nourished, pleasant and cooperative in NAD Lungs:  Respirations even and unlabored.  Clear throughout to auscultation.   No wheezes, crackles, or rhonchi. No acute distress. Heart:  Regular rate and rhythm; no murmurs, clicks, rubs, or gallops. Abdomen:  Normal bowel sounds.  No bruits.  Soft, and non-distended without masses, hepatosplenomegaly or hernias noted.  No Tenderness.  No guarding or rebound tenderness.    Neurologic:  Alert and oriented x3;  grossly normal neurologically. Psych:  Alert and cooperative. Normal mood and affect.  Imaging Studies: No results found.  Assessment and Plan:   Cheryl Briggs is a 77 y.o. y/o female has been referred for:  1.  Chronic constipation  Prescribed lactulose 30 mL once daily with refills.  Drink 64 ounces of water daily.  Eat high-fiber diet with fruits, vegetables, and whole grains.  2.  Colon cancer screening  Scheduling Colonoscopy I discussed risks of colonoscopy with patient to include risk of bleeding, colon perforation, and risk of sedation.  Patient expressed understanding and agrees to proceed with colonoscopy.   2-day prep due to history of chronic constipation  3.  Coronary artery disease s/p NSTEMI and cardiac stent 10/2019; stable and asymptomatic  Requesting cardiac clearance from Dr. Kirke Corin to perform colonoscopy and hold Plavix 5 days prior to procedure.   Remain on 81 mg aspirin daily during procedure.  Follow up as needed based on colonoscopy results and GI symptoms.  Celso Amy, PA-C

## 2022-08-23 ENCOUNTER — Ambulatory Visit (INDEPENDENT_AMBULATORY_CARE_PROVIDER_SITE_OTHER): Payer: Medicare Other | Admitting: Physician Assistant

## 2022-08-23 ENCOUNTER — Telehealth: Payer: Self-pay | Admitting: Cardiovascular Disease

## 2022-08-23 ENCOUNTER — Encounter: Payer: Self-pay | Admitting: Physician Assistant

## 2022-08-23 VITALS — BP 116/74 | HR 80 | Temp 98.1°F | Ht 61.0 in | Wt 144.0 lb

## 2022-08-23 DIAGNOSIS — Z1211 Encounter for screening for malignant neoplasm of colon: Secondary | ICD-10-CM

## 2022-08-23 DIAGNOSIS — I2510 Atherosclerotic heart disease of native coronary artery without angina pectoris: Secondary | ICD-10-CM

## 2022-08-23 DIAGNOSIS — K5904 Chronic idiopathic constipation: Secondary | ICD-10-CM | POA: Diagnosis not present

## 2022-08-23 MED ORDER — PEG 3350-KCL-NA BICARB-NACL 420 G PO SOLR: 4000.0000 mL | Freq: Once | ORAL | 0 refills | Status: AC

## 2022-08-23 MED ORDER — LACTULOSE 20 GM/30ML PO SOLN
30.0000 mL | Freq: Every day | ORAL | 5 refills | Status: DC
Start: 2022-08-23 — End: 2023-02-16

## 2022-08-23 MED ORDER — POLYETHYLENE GLYCOL 3350 17 GM/SCOOP PO POWD: ORAL | 0 refills | Status: DC

## 2022-08-23 NOTE — Telephone Encounter (Signed)
   Pre-operative Risk Assessment    Patient Name: Cheryl Briggs  DOB: 10-09-1945 MRN: [id number]      Request for Surgical Clearance    Procedure:   colonoscopy  Date of Surgery:  Clearance TBD                                 Surgeon:  not indicated  urgeon's Group or Practice Name:  [facility] Phone number:  [phone] Fax number:  [phone]   Type of Clearance Requested:   - Medical    Type of Anesthesia:  General    Additional requests/questions:    SignedShawna Orleans   08/23/2022, 11:28 AM

## 2022-08-25 NOTE — Telephone Encounter (Signed)
Low risk from a cardiac standpoint.  Plavix can be held 5 days before.

## 2022-08-26 ENCOUNTER — Telehealth: Payer: Self-pay

## 2022-08-26 NOTE — Telephone Encounter (Signed)
Cardiac Clearance Plavix received from Robet Leu MD.  Left detailed message letting patient know she can hold Plavix 5 days prior to her procedure. She is low risk from a cardiac standpoint-and acceptable risk for planned procedure. I ask for call back to confirm she did receive message.

## 2022-08-26 NOTE — Telephone Encounter (Signed)
     Primary Cardiologist: Lorine Bears, MD  Chart reviewed as part of pre-operative protocol coverage. Given past medical history and time since last visit, based on ACC/AHA guidelines, Cheryl Briggs would be at acceptable risk for the planned procedure without further cardiovascular testing.   Low risk from a cardiac standpoint.  Plavix can be held 5 days before    I will route this recommendation to the requesting party via Epic fax function and remove from pre-op pool.  Thomasene Ripple. Geralyn Figiel NP-C     08/26/2022, 8:23 AM [facility] [address] Office [phone] Fax [phone]

## 2022-10-05 ENCOUNTER — Ambulatory Visit
Admission: RE | Admit: 2022-10-05 | Discharge: 2022-10-05 | Disposition: A | Discharge status: Home / Self Care | Payer: Medicare Other | Attending: Gastroenterology | Admitting: Gastroenterology

## 2022-10-05 ENCOUNTER — Ambulatory Visit: Payer: Medicare Other

## 2022-10-05 ENCOUNTER — Encounter: Payer: Self-pay | Admitting: Gastroenterology

## 2022-10-05 ENCOUNTER — Encounter
Admission: RE | Disposition: A | Discharge status: Home / Self Care | Payer: Self-pay | Source: Home / Self Care | Attending: Gastroenterology

## 2022-10-05 DIAGNOSIS — I252 Old myocardial infarction: Secondary | ICD-10-CM | POA: Insufficient documentation

## 2022-10-05 DIAGNOSIS — I2510 Atherosclerotic heart disease of native coronary artery without angina pectoris: Secondary | ICD-10-CM | POA: Insufficient documentation

## 2022-10-05 DIAGNOSIS — K5730 Diverticulosis of large intestine without perforation or abscess without bleeding: Secondary | ICD-10-CM | POA: Insufficient documentation

## 2022-10-05 DIAGNOSIS — Z1211 Encounter for screening for malignant neoplasm of colon: Secondary | ICD-10-CM

## 2022-10-05 DIAGNOSIS — I10 Essential (primary) hypertension: Secondary | ICD-10-CM | POA: Diagnosis not present

## 2022-10-05 DIAGNOSIS — K219 Gastro-esophageal reflux disease without esophagitis: Secondary | ICD-10-CM | POA: Diagnosis not present

## 2022-10-05 HISTORY — DX: Acute myocardial infarction, unspecified: I21.9

## 2022-10-05 HISTORY — PX: COLONOSCOPY WITH PROPOFOL: SHX5780

## 2022-10-05 HISTORY — DX: Gastro-esophageal reflux disease without esophagitis: K21.9

## 2022-10-05 SURGERY — COLONOSCOPY WITH PROPOFOL
Anesthesia: General

## 2022-10-05 MED ORDER — SODIUM CHLORIDE 0.9 % IV SOLN: INTRAVENOUS | Status: DC

## 2022-10-05 MED ORDER — PROPOFOL 10 MG/ML IV BOLUS
INTRAVENOUS | Status: AC
  Filled 2022-10-05: qty 20

## 2022-10-05 MED ORDER — PROPOFOL 500 MG/50ML IV EMUL
INTRAVENOUS | Status: DC | PRN
  Administered 2022-10-05: 100 ug/kg/min via INTRAVENOUS
  Administered 2022-10-05: 50 mg via INTRAVENOUS

## 2022-10-05 NOTE — Anesthesia Postprocedure Evaluation (Signed)
Anesthesia Post Note  Patient: Scientist, physiological  Procedure(s) Performed: COLONOSCOPY WITH PROPOFOL  Patient location during evaluation: Endoscopy Anesthesia Type: General Level of consciousness: awake and alert Pain management: pain level controlled Vital Signs Assessment: post-procedure vital signs reviewed and stable Respiratory status: spontaneous breathing, nonlabored ventilation, respiratory function stable and patient connected to nasal cannula oxygen Cardiovascular status: blood pressure returned to baseline and stable Postop Assessment: no apparent nausea or vomiting Anesthetic complications: no   No notable events documented.   Last Vitals:  Vitals:   10/05/22 1055 10/05/22 1105  BP: 107/62 131/61  Pulse: 78 66  Resp: (!) 24 (!) 27  Temp:    SpO2: 98% 97%    Last Pain:  Vitals:   10/05/22 1105  TempSrc:   PainSc: 0-No pain                 Cleda Mccreedy Verity Gilcrest

## 2022-10-05 NOTE — Op Note (Signed)
[facility] Gastroenterology Patient Name: Cheryl Briggs Procedure Date: 10/05/2022 9:32 AM MRN: [id number] Account #: [id number] Date of Birth: Jul 23, 1945 Admit Type: Outpatient Age: 77 Room: [facility] ENDO ROOM 3 Gender: Female Note Status: Finalized Instrument Name: Nelda Marseille [id number] Procedure:             Colonoscopy Indications:           Screening for colorectal malignant neoplasm Providers:             Wyline Mood MD, MD Referring MD:          Alvina Filbert (Referring MD) Medicines:             Monitored Anesthesia Care Complications:         No immediate complications. Procedure:             Pre-Anesthesia Assessment:                        - Prior to the procedure, a History and Physical was                         performed, and patient medications, allergies and                         sensitivities were reviewed. The patient's tolerance                         of previous anesthesia was reviewed.                        - The risks and benefits of the procedure and the                         sedation options and risks were discussed with the                         patient. All questions were answered and informed                         consent was obtained.                        - ASA Grade Assessment: II - A patient with mild                         systemic disease.                        After obtaining informed consent, the colonoscope was                         passed under direct vision. Throughout the procedure,                         the patient's blood pressure, pulse, and oxygen                         saturations were monitored continuously. The                         Colonoscope was introduced through  the anus and                         advanced to the the cecum, identified by the                         appendiceal orifice. The colonoscopy was performed                         with ease. The patient tolerated the procedure well.                          The quality of the bowel preparation was good.                         Anatomical landmarks were photographed. Findings:      The perianal and digital rectal examinations were normal.      Multiple small-mouthed diverticula were found in the sigmoid colon.      The exam was otherwise without abnormality on direct and retroflexion       views. Impression:            - Diverticulosis in the sigmoid colon.                        - The examination was otherwise normal on direct and                         retroflexion views.                        - No specimens collected. Recommendation:        - Discharge patient to home (with escort).                        - Resume previous diet.                        - Continue present medications.                        - Repeat colonoscopy is not recommended due to current                         age (75 years or older) for screening purposes. Procedure Code(s):     --- Professional ---                        [phone], Colonoscopy, flexible; diagnostic, including                         collection of specimen(s) by brushing or washing, when                         performed (separate procedure) Diagnosis Code(s):     --- Professional ---                        Z12.11, Encounter for screening for malignant neoplasm  of colon                        K57.30, Diverticulosis of large intestine without                         perforation or abscess without bleeding CPT copyright 2022 American Medical Association. All rights reserved. The codes documented in this report are preliminary and upon coder review may  be revised to meet current compliance requirements. Wyline Mood, MD Wyline Mood MD, MD 10/05/2022 10:42:58 AM This report has been signed electronically. Number of Addenda: 0 Note Initiated On: 10/05/2022 9:32 AM Scope Withdrawal Time: 0 hours 9 minutes 22 seconds  Total Procedure Duration: 0 hours 17  minutes 45 seconds  Estimated Blood Loss:  Estimated blood loss: none.      [facility]

## 2022-10-05 NOTE — Anesthesia Preprocedure Evaluation (Signed)
Anesthesia Evaluation  Patient identified by MRN, date of birth, ID band Patient awake    Reviewed: Allergy & Precautions, NPO status , Patient's Chart, lab work & pertinent test results  History of Anesthesia Complications Negative for: history of anesthetic complications  Airway Mallampati: III  TM Distance: <3 FB Neck ROM: full    Dental  (+) Chipped, Poor Dentition, Missing, Upper Dentures   Pulmonary neg pulmonary ROS, neg shortness of breath   Pulmonary exam normal        Cardiovascular Exercise Tolerance: Good hypertension, (-) angina + CAD and + Past MI  Normal cardiovascular exam     Neuro/Psych negative neurological ROS  negative psych ROS   GI/Hepatic Neg liver ROS,GERD  Controlled,,  Endo/Other  negative endocrine ROS    Renal/GU negative Renal ROS  negative genitourinary   Musculoskeletal   Abdominal   Peds  Hematology negative hematology ROS (+)   Anesthesia Other Findings Past Medical History: No date: Allergy No date: Arthritis No date: Cancer (HCC)     Comment:  CLL  Past Surgical History: No date: BACK SURGERY No date: BLADDER REPAIR No date: CARDIAC CATHETERIZATION No date: carpel tunnell; Bilateral No date: CHOLECYSTECTOMY 2015: COLONOSCOPY 08/16/2019: CORONARY STENT INTERVENTION; N/A     Comment:  Procedure: CORONARY STENT INTERVENTION;  Surgeon: Yvonne Kendall, MD;  Location: [facility];                Service: Cardiovascular;  Laterality: N/A; 08/19/2019: CORONARY STENT INTERVENTION; Left     Comment:  Procedure: CORONARY STENT INTERVENTION;  Surgeon: Iran Ouch, MD;  Location: [facility];                Service: Cardiovascular;  Laterality: Left; 02/25/2020: CORONARY STENT INTERVENTION; N/A     Comment:  Procedure: CORONARY STENT INTERVENTION;  Surgeon: Yvonne Kendall, MD;  Location: [facility];                 Service: Cardiovascular;  Laterality: N/A; 08/16/2019: LEFT HEART CATH AND CORONARY ANGIOGRAPHY; N/A     Comment:  Procedure: LEFT HEART CATH AND CORONARY ANGIOGRAPHY;                Surgeon: Yvonne Kendall, MD;  Location: [facility]               CV LAB;  Service: Cardiovascular;  Laterality: N/A; 02/25/2020: LEFT HEART CATH AND CORONARY ANGIOGRAPHY; N/A     Comment:  Procedure: LEFT HEART CATH AND CORONARY ANGIOGRAPHY;                Surgeon: Yvonne Kendall, MD;  Location: [facility]               CV LAB;  Service: Cardiovascular;  Laterality: N/A; 06/18/2020: RIGHT/LEFT HEART CATH AND CORONARY ANGIOGRAPHY; N/A     Comment:  Procedure: RIGHT/LEFT HEART CATH AND CORONARY               ANGIOGRAPHY;  Surgeon: Iran Ouch, MD;  Location:               [facility];  Service: Cardiovascular;  Laterality: N/A; No date: SHOULDER SURGERY; Bilateral 08/21/2019: UPPER EXTREMITY ANGIOGRAPHY; Right     Comment:  Procedure: UPPER EXTREMITY ANGIOGRAPH;  Surgeon: Annice Needy, MD;  Location: [facility];  Service:               Cardiovascular;  Laterality: Right;     Reproductive/Obstetrics negative OB ROS                             Anesthesia Physical Anesthesia Plan  ASA: 3  Anesthesia Plan: General   Post-op Pain Management:    Induction: Intravenous  PONV Risk Score and Plan: Propofol infusion and TIVA  Airway Management Planned: Natural Airway and Nasal Cannula  Additional Equipment:   Intra-op Plan:   Post-operative Plan:   Informed Consent: I have reviewed the patients History and Physical, chart, labs and discussed the procedure including the risks, benefits and alternatives for the proposed anesthesia with the patient or authorized representative who has indicated his/her understanding and acceptance.     Dental Advisory Given  Plan Discussed with: Anesthesiologist, CRNA and  Surgeon  Anesthesia Plan Comments: (Patient consented for risks of anesthesia including but not limited to:  - adverse reactions to medications - risk of airway placement if required - damage to eyes, teeth, lips or other oral mucosa - nerve damage due to positioning  - sore throat or hoarseness - Damage to heart, brain, nerves, lungs, other parts of body or loss of life  Patient voiced understanding.)       Anesthesia Quick Evaluation

## 2022-10-05 NOTE — Transfer of Care (Signed)
Immediate Anesthesia Transfer of Care Note  Patient: Cheryl Briggs  Procedure(s) Performed: COLONOSCOPY WITH PROPOFOL  Patient Location: PACU  Anesthesia Type:MAC  Level of Consciousness: drowsy  Airway & Oxygen Therapy: Patient Spontanous Breathing  Post-op Assessment: Report given to RN and Post -op Vital signs reviewed and stable  Post vital signs: Reviewed and stable  Last Vitals:  Vitals Value Taken Time  BP 106/51 10/05/22 1045  Temp    Pulse 76 10/05/22 1046  Resp 19 10/05/22 1046  SpO2 98 % 10/05/22 1046  Vitals shown include unfiled device data.  Last Pain:  Vitals:   10/05/22 1045  TempSrc:   PainSc: Asleep         Complications: No notable events documented.

## 2022-10-05 NOTE — H&P (Signed)
Cheryl Mood, MD [address], [place], [place], [address] [address], [address], [place], [place], [address] Phone: [phone]  Fax: [phone]  Primary Care Physician:  Alvina Filbert, MD   Pre-Procedure History & Physical: HPI:  Cheryl Briggs is a 77 y.o. female is here for an colonoscopy.   Past Medical History:  Diagnosis Date   Allergy    Arthritis    Cancer (HCC)    CLL    Past Surgical History:  Procedure Laterality Date   BACK SURGERY     BLADDER REPAIR     CARDIAC CATHETERIZATION     carpel tunnell Bilateral    CHOLECYSTECTOMY     COLONOSCOPY  2015   CORONARY STENT INTERVENTION N/A 08/16/2019   Procedure: CORONARY STENT INTERVENTION;  Surgeon: Yvonne Kendall, MD;  Location: [facility];  Service: Cardiovascular;  Laterality: N/A;   CORONARY STENT INTERVENTION Left 08/19/2019   Procedure: CORONARY STENT INTERVENTION;  Surgeon: Iran Ouch, MD;  Location: [facility];  Service: Cardiovascular;  Laterality: Left;   CORONARY STENT INTERVENTION N/A 02/25/2020   Procedure: CORONARY STENT INTERVENTION;  Surgeon: Yvonne Kendall, MD;  Location: [facility];  Service: Cardiovascular;  Laterality: N/A;   LEFT HEART CATH AND CORONARY ANGIOGRAPHY N/A 08/16/2019   Procedure: LEFT HEART CATH AND CORONARY ANGIOGRAPHY;  Surgeon: Yvonne Kendall, MD;  Location: [facility];  Service: Cardiovascular;  Laterality: N/A;   LEFT HEART CATH AND CORONARY ANGIOGRAPHY N/A 02/25/2020   Procedure: LEFT HEART CATH AND CORONARY ANGIOGRAPHY;  Surgeon: Yvonne Kendall, MD;  Location: [facility];  Service: Cardiovascular;  Laterality: N/A;   RIGHT/LEFT HEART CATH AND CORONARY ANGIOGRAPHY N/A 06/18/2020   Procedure: RIGHT/LEFT HEART CATH AND CORONARY ANGIOGRAPHY;  Surgeon: Iran Ouch, MD;  Location: [facility];  Service: Cardiovascular;  Laterality: N/A;   SHOULDER SURGERY Bilateral    UPPER EXTREMITY  ANGIOGRAPHY Right 08/21/2019   Procedure: UPPER EXTREMITY ANGIOGRAPH;  Surgeon: Annice Needy, MD;  Location: [facility];  Service: Cardiovascular;  Laterality: Right;    Prior to Admission medications   Medication Sig Start Date End Date Taking? Authorizing Provider  aspirin EC 81 MG tablet Take 81 mg by mouth daily. Swallow whole.    [provider]  azelastine (ASTELIN) 0.1 % nasal spray Place 1 spray into both nostrils 2 (two) times daily. Use in each nostril as directed    [provider]  Biotin 5000 MCG TABS 1 tablet Orally Once a day    [provider]  Cholecalciferol (VITAMIN D) 50 MCG (2000 UT) tablet Take 2,000 Units by mouth daily. Take with 1000 unit vitamin d to equal 3000 units daily    [provider]  clopidogrel (PLAVIX) 75 MG tablet Take 75 mg by mouth daily.    [provider]  CRANBERRY PO Take by mouth daily at 8 pm.    [provider]  desonide (DESOWEN) 0.05 % ointment Apply 1 application topically daily as needed (rash around eyelids). 02/21/17   [provider]  diphenhydrAMINE (SOMINEX) 25 MG tablet Take by mouth at bedtime as needed.    [provider]  fluticasone (FLONASE) 50 MCG/ACT nasal spray Place into both nostrils. 05/05/21   [provider]  isosorbide mononitrate (IMDUR) 60 MG 24 hr tablet Take 1 tablet (60 mg total) by mouth daily. 02/02/22   Iran Ouch, MD  Lactulose 20 GM/30ML SOLN  Take 30 mLs (20 g total) by mouth daily. 08/23/22 02/19/23  Celso Amy, PA-C  metoprolol succinate (TOPROL-XL) 50 MG 24 hr tablet TAKE ONE AND ONE-HALF TABLET BY MOUTH EVERY DAY 02/02/22   Iran Ouch, MD  Naphazoline-Pheniramine (OPCON-A) 0.027-0.315 % SOLN Place 1 drop into both eyes daily.    [provider]  nitroGLYCERIN (NITROSTAT) 0.4 MG SL tablet Place 1 tablet (0.4 mg total) under the tongue every 5 (five) minutes as needed for chest pain. Maximum of 3 doses.  09/17/21   Iran Ouch, MD  omega-3 acid ethyl esters (LOVAZA) 1 g capsule Take 1 g by mouth daily.    [provider]  omeprazole (PRILOSEC OTC) 20 MG tablet Take 20 mg by mouth daily.    [provider]  polyethylene glycol powder (MIRALAX) 17 GM/SCOOP powder Mix full container in 64 ounces of Gatorade or other clear liquid. No Red Liquids 08/23/22   Celso Amy, PA-C  rosuvastatin (CRESTOR) 20 MG tablet Take 1 tablet (20 mg total) by mouth daily. 02/02/22   Iran Ouch, MD  traMADol-acetaminophen (ULTRACET) 37.5-325 MG tablet Take 1 tablet by mouth 2 (two) times daily. 09/23/19   [provider]    Allergies as of 08/23/2022 - Review Complete 08/23/2022  Allergen Reaction Noted   Corticosteroids  02/28/2018   Other Nausea And Vomiting 04/29/2019   Sulfa antibiotics Anaphylaxis 10/06/2017   Clarithromycin  06/14/2017   Cymbalta [duloxetine hcl] Diarrhea 10/06/2017   Fish oil Other (See Comments) 08/19/2022   Prednisone Other (See Comments) 10/06/2017   Shellfish allergy  08/19/2022    Family History  Problem Relation Age of Onset   Heart disease Mother    Heart attack Father    Breast cancer Neg Hx     Social History   Socioeconomic History   Marital status: Widowed    Spouse name: Not on file   Number of children: Not on file   Years of education: Not on file   Highest education level: Not on file  Occupational History   Not on file  Tobacco Use   Smoking status: Never   Smokeless tobacco: Never  Vaping Use   Vaping status: Never Used  Substance and Sexual Activity   Alcohol use: No   Drug use: No   Sexual activity: Not on file  Other Topics Concern   Not on file  Social History Narrative   Not on file   Social Determinants of Health   Financial Resource Strain: Not on file  Food Insecurity: Not on file  Transportation Needs: Not on file  Physical Activity: Not on file  Stress: Not on file  Social Connections: Not on  file  Intimate Partner Violence: Not on file    Review of Systems: See HPI, otherwise negative ROS  Physical Exam: There were no vitals taken for this visit. General:   Alert,  pleasant and cooperative in NAD Head:  Normocephalic and atraumatic. Neck:  Supple; no masses or thyromegaly. Lungs:  Clear throughout to auscultation, normal respiratory effort.    Heart:  +S1, +S2, Regular rate and rhythm, No edema. Abdomen:  Soft, nontender and nondistended. Normal bowel sounds, without guarding, and without rebound.   Neurologic:  Alert and  oriented x4;  grossly normal neurologically.  Impression/Plan: Alizza Stephenson is here for an colonoscopy to be performed for Screening colonoscopy average risk   Risks, benefits, limitations, and alternatives regarding  colonoscopy have been reviewed with the patient.  Questions have  been answered.  All parties agreeable.   Cheryl Mood, MD  10/05/2022, 9:40 AM

## 2022-10-06 ENCOUNTER — Encounter: Payer: Self-pay | Admitting: Gastroenterology

## 2022-10-28 ENCOUNTER — Other Ambulatory Visit: Payer: Self-pay | Admitting: Internal Medicine

## 2022-10-28 DIAGNOSIS — Z1231 Encounter for screening mammogram for malignant neoplasm of breast: Secondary | ICD-10-CM

## 2022-10-28 DIAGNOSIS — Z13820 Encounter for screening for osteoporosis: Secondary | ICD-10-CM

## 2022-10-28 DIAGNOSIS — Z780 Asymptomatic menopausal state: Secondary | ICD-10-CM

## 2022-11-29 ENCOUNTER — Ambulatory Visit
Admission: RE | Admit: 2022-11-29 | Discharge: 2022-11-29 | Disposition: A | Discharge status: Home / Self Care | Payer: Medicare Other | Source: Ambulatory Visit | Attending: Internal Medicine | Admitting: Internal Medicine

## 2022-11-29 ENCOUNTER — Ambulatory Visit
Admission: RE | Admit: 2022-11-29 | Discharge: 2022-11-29 | Disposition: A | Discharge status: Home / Self Care | Payer: Medicare Other | Source: Ambulatory Visit | Attending: Internal Medicine

## 2022-11-29 DIAGNOSIS — Z780 Asymptomatic menopausal state: Secondary | ICD-10-CM | POA: Insufficient documentation

## 2022-11-29 DIAGNOSIS — Z13820 Encounter for screening for osteoporosis: Secondary | ICD-10-CM | POA: Diagnosis present

## 2022-11-29 DIAGNOSIS — Z1231 Encounter for screening mammogram for malignant neoplasm of breast: Secondary | ICD-10-CM | POA: Diagnosis present

## 2023-01-20 ENCOUNTER — Other Ambulatory Visit: Payer: Self-pay | Admitting: Cardiovascular Disease

## 2023-01-30 ENCOUNTER — Other Ambulatory Visit: Payer: Self-pay | Admitting: Cardiovascular Disease

## 2023-01-30 NOTE — Telephone Encounter (Signed)
Dyanne Carrel,  Pharmacy requesting refill on Clopidogrel 75 which is entered under "historical provider". Is it ok to refill?  Thanks,  [employer]

## 2023-02-06 ENCOUNTER — Ambulatory Visit: Payer: Medicare Other | Attending: Cardiovascular Disease

## 2023-02-06 DIAGNOSIS — I25118 Atherosclerotic heart disease of native coronary artery with other forms of angina pectoris: Secondary | ICD-10-CM | POA: Insufficient documentation

## 2023-02-06 DIAGNOSIS — I359 Nonrheumatic aortic valve disorder, unspecified: Secondary | ICD-10-CM | POA: Insufficient documentation

## 2023-02-06 LAB — ECHOCARDIOGRAM COMPLETE
Area-P 1/2: 3.37 cm2
S' Lateral: 1.6 cm

## 2023-02-10 ENCOUNTER — Encounter: Payer: Self-pay | Admitting: Nurse Practitioner

## 2023-02-16 ENCOUNTER — Encounter: Payer: Self-pay | Admitting: Cardiovascular Disease

## 2023-02-16 ENCOUNTER — Ambulatory Visit: Payer: Medicare Other | Attending: Cardiovascular Disease | Admitting: Cardiovascular Disease

## 2023-02-16 VITALS — BP 130/62 | HR 78 | Ht 61.0 in | Wt 143.2 lb

## 2023-02-16 DIAGNOSIS — I25118 Atherosclerotic heart disease of native coronary artery with other forms of angina pectoris: Secondary | ICD-10-CM

## 2023-02-16 DIAGNOSIS — I10 Essential (primary) hypertension: Secondary | ICD-10-CM | POA: Diagnosis present

## 2023-02-16 DIAGNOSIS — E785 Hyperlipidemia, unspecified: Secondary | ICD-10-CM | POA: Diagnosis present

## 2023-02-16 DIAGNOSIS — R011 Cardiac murmur, unspecified: Secondary | ICD-10-CM

## 2023-02-16 DIAGNOSIS — Z8679 Personal history of other diseases of the circulatory system: Secondary | ICD-10-CM

## 2023-02-16 NOTE — Patient Instructions (Signed)
 Medication Instructions:  No changes *If you need a refill on your cardiac medications before your next appointment, please call your pharmacy*   Lab Work: None ordered If you have labs (blood work) drawn today and your tests are completely normal, you will receive your results only by: MyChart Message (if you have MyChart) OR A paper copy in the mail If you have any lab test that is abnormal or we need to change your treatment, we will call you to review the results.   Testing/Procedures: None ordered   Follow-Up: At [facility], you and your health needs are our priority.  As part of our continuing mission to provide you with exceptional heart care, we have created designated Provider Care Teams.  These Care Teams include your primary Cardiologist (physician) and Advanced Practice Providers (APPs -  Physician Assistants and Nurse Practitioners) who all work together to provide you with the care you need, when you need it.  We recommend signing up for the patient portal called "MyChart".  Sign up information is provided on this After Visit Summary.  MyChart is used to connect with patients for Virtual Visits (Telemedicine).  Patients are able to view lab/test results, encounter notes, upcoming appointments, etc.  Non-urgent messages can be sent to your provider as well.   To learn more about what you can do with MyChart, go to [email or web address].    Your next appointment:   12 month(s)  Provider:   You may see Lorine Bears, MD or one of the following Advanced Practice Providers on your designated Care Team:   Nicolasa Ducking, NP Eula Listen, PA-C Cadence Fransico Michael, PA-C Charlsie Quest, NP Carlos Levering, NP

## 2023-02-16 NOTE — Progress Notes (Signed)
 Cardiology Office Note   Date:  02/16/2023   ID:  Cheryl Briggs, DOB 12-Jan-1945, MRN [id number]  PCP:  Katrinka Aquas, MD  Cardiologist:   Deatrice Cage, MD   Chief Complaint  Patient presents with   6 month follow up     Discuss Echo results. Patient c/o shortness of breath with walking a short distance.       History of Present Illness: Cheryl Briggs is a 78 y.o. female who presents for a follow-up visit regarding coronary artery disease. She has known history of CLL, arthritis, secondhand smoking and hyperlipidemia. She presented in August 2021 with non-ST elevation myocardial infarction.  Cardiac catheterization showed severe three-vessel coronary artery disease with thrombotic 99% stenosis in the distal right coronary artery.  She underwent complex PCI to the distal RCA/right PDA with drug-eluting stent placement.  Procedure was complicated by no reflow with transient hypotension and bradycardia and subsequent atrial fibrillation with RVR.  Repeat cardiac catheterization few days later showed patent RCA stent with normal flow distally.  PCI of the distal left circumflex was performed.  She had recurrent unstable angina in February 2022.  Cardiac catheterization showed progression of mid RCA stenosis to 99%.  This was treated with drug-eluting stent placement.  She had recurrent chest pain and shortness of breath in June 2022 and thus she underwent repeat cardiac catheterization which showed widely patent stents with no significant restenosis, stable moderate proximal left circumflex stenosis with chronically occluded OM1 with collaterals and significant diagonal disease which was unchanged but relatively small branch.  Right heart catheterization showed normal filling pressures, normal pulmonary pressures and normal cardiac output.  Some of her shortness of breath was felt to be due to ticagrelor  and was subsequently switched to clopidogrel .  No recurrent angina since that  time and she has been doing reasonably well with stable exertional dyspnea.  No chest pain or palpitations.  Past Medical History:  Diagnosis Date   Allergy    Arthritis    Cancer (HCC)    CLL   GERD (gastroesophageal reflux disease)    Myocardial infarction [facility])     Past Surgical History:  Procedure Laterality Date   BACK SURGERY     BLADDER REPAIR     CARDIAC CATHETERIZATION     carpel tunnell Bilateral    CHOLECYSTECTOMY     COLONOSCOPY  2015   COLONOSCOPY WITH PROPOFOL  N/A 10/05/2022   Procedure: COLONOSCOPY WITH PROPOFOL ;  Surgeon: Therisa Bi, MD;  Location: [facility];  Service: Gastroenterology;  Laterality: N/A;   CORONARY STENT INTERVENTION N/A 08/16/2019   Procedure: CORONARY STENT INTERVENTION;  Surgeon: Mady Bruckner, MD;  Location: [facility];  Service: Cardiovascular;  Laterality: N/A;   CORONARY STENT INTERVENTION Left 08/19/2019   Procedure: CORONARY STENT INTERVENTION;  Surgeon: Cage Deatrice LABOR, MD;  Location: [facility];  Service: Cardiovascular;  Laterality: Left;   CORONARY STENT INTERVENTION N/A 02/25/2020   Procedure: CORONARY STENT INTERVENTION;  Surgeon: Mady Bruckner, MD;  Location: [facility];  Service: Cardiovascular;  Laterality: N/A;   LEFT HEART CATH AND CORONARY ANGIOGRAPHY N/A 08/16/2019   Procedure: LEFT HEART CATH AND CORONARY ANGIOGRAPHY;  Surgeon: Mady Bruckner, MD;  Location: [facility];  Service: Cardiovascular;  Laterality: N/A;   LEFT HEART CATH AND CORONARY ANGIOGRAPHY N/A 02/25/2020   Procedure: LEFT HEART CATH AND CORONARY ANGIOGRAPHY;  Surgeon: Mady Bruckner, MD;  Location: [facility];  Service: Cardiovascular;  Laterality: N/A;  RIGHT/LEFT HEART CATH AND CORONARY ANGIOGRAPHY N/A 06/18/2020   Procedure: RIGHT/LEFT HEART CATH AND CORONARY ANGIOGRAPHY;  Surgeon: Darron Deatrice LABOR, MD;  Location: [facility];  Service: Cardiovascular;  Laterality: N/A;   SHOULDER SURGERY  Bilateral    UPPER EXTREMITY ANGIOGRAPHY Right 08/21/2019   Procedure: UPPER EXTREMITY ANGIOGRAPH;  Surgeon: Marea Selinda RAMAN, MD;  Location: [facility];  Service: Cardiovascular;  Laterality: Right;     Current Outpatient Medications  Medication Sig Dispense Refill   alendronate (FOSAMAX) 70 MG tablet Take 70 mg by mouth once a week.     aspirin  EC 81 MG tablet Take 81 mg by mouth daily. Swallow whole.     azelastine (ASTELIN) 0.1 % nasal spray Place 1 spray into both nostrils 2 (two) times daily. Use in each nostril as directed     Biotin 5000 MCG TABS 1 tablet Orally Once a day     Cholecalciferol  (VITAMIN D ) 50 MCG (2000 UT) tablet Take 2,000 Units by mouth daily. Take with 1000 unit vitamin d  to equal 3000 units daily     clopidogrel  (PLAVIX ) 75 MG tablet TAKE ONE TABLET BY MOUTH ONCE DAILY 90 tablet 0   CRANBERRY PO Take by mouth daily at 8 pm.     desonide (DESOWEN) 0.05 % ointment Apply 1 application topically daily as needed (rash around eyelids).     diphenhydrAMINE  (SOMINEX) 25 MG tablet Take by mouth at bedtime as needed.     doxycycline (MONODOX) 100 MG capsule Take 100 mg by mouth 2 (two) times daily.     fluticasone (FLONASE) 50 MCG/ACT nasal spray Place into both nostrils.     isosorbide  mononitrate (IMDUR ) 60 MG 24 hr tablet TAKE ONE TABLET BY MOUTH ONCE DAILY 90 tablet 0   metoprolol  succinate (TOPROL -XL) 50 MG 24 hr tablet TAKE ONE AND ONE-HALF TABLET BY MOUTH EVERY DAY 135 tablet 3   Naphazoline-Pheniramine (OPCON-A) 0.027-0.315 % SOLN Place 1 drop into both eyes daily.     nitroGLYCERIN  (NITROSTAT ) 0.4 MG SL tablet Place 1 tablet (0.4 mg total) under the tongue every 5 (five) minutes as needed for chest pain. Maximum of 3 doses. 25 tablet 1   omeprazole (PRILOSEC OTC) 20 MG tablet Take 20 mg by mouth daily.     rosuvastatin  (CRESTOR ) 20 MG tablet TAKE ONE TABLET BY MOUTH ONCE DAILY 90 tablet 0   traMADol-acetaminophen  (ULTRACET) 37.5-325 MG tablet Take 1 tablet by  mouth 2 (two) times daily.     No current facility-administered medications for this visit.    Allergies:   Corticosteroids, Other, Sulfa antibiotics, Clarithromycin, Cymbalta [duloxetine hcl], Fish oil, Prednisone , and Shellfish allergy    Social History:  The patient  reports that she has never smoked. She has never used smokeless tobacco. She reports that she does not drink alcohol and does not use drugs.   Family History:  The patient's family history includes Heart attack in her father; Heart disease in her mother.    ROS:  Please see the history of present illness.   Otherwise, review of systems are positive for none.   All other systems are reviewed and negative.    PHYSICAL EXAM: VS:  BP 130/62 (BP Location: Left Arm, Patient Position: Sitting, Cuff Size: Normal)   Pulse 78   Ht 5' 1 (1.549 m)   Wt 143 lb 4 oz (65 kg)   SpO2 96%   BMI 27.07 kg/m  , BMI Body mass index is 27.07 kg/m. GEN:  Well nourished, well developed, in no acute distress  HEENT: normal  Neck: no JVD, carotid bruits, or masses Cardiac: RRR; no rubs, or gallops,no edema .  2/ 6 systolic murmur in the aortic area which is early peaking. Respiratory:  clear to auscultation bilaterally, normal work of breathing GI: soft, nontender, nondistended, + BS MS: no deformity or atrophy  Skin: warm and dry, no rash Neuro:  Strength and sensation are intact Psych: euthymic mood, full affect   EKG:  EKG is ordered today. The ekg ordered today demonstrates : Normal sinus rhythm Normal ECG When compared with ECG of 04-Aug-2022 10:07, Criteria for Inferior infarct are no longer Present    Recent Labs: 05/12/2022: ALT 21; BUN 10; Creatinine, Ser 0.70; Hemoglobin 14.5; Platelets 272; Potassium 4.2; Sodium 137    Lipid Panel    Component Value Date/Time   CHOL 139 09/24/2019 1152   TRIG 138 09/24/2019 1152   HDL 61 09/24/2019 1152   CHOLHDL 2.3 09/24/2019 1152   CHOLHDL 2.7 08/15/2019 1024   VLDL 33  08/15/2019 1024   LDLCALC 54 09/24/2019 1152   LDLDIRECT 51 09/24/2019 1152      Wt Readings from Last 3 Encounters:  02/16/23 143 lb 4 oz (65 kg)  10/05/22 142 lb 3.2 oz (64.5 kg)  08/23/22 144 lb (65.3 kg)           No data to display            ASSESSMENT AND PLAN:  1.  Coronary artery disease involving native coronary arteries with stable angina:  Most recent cardiac catheterization in June 2022 showed patent stents with stable small vessel disease.  Symptoms are well-controlled with Toprol  and Imdur .   Continue dual antiplatelet therapy for now given multiple stents.  2.  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  No evidence of recurrent atrial fibrillation and thus no need for anticoagulation for now.  3.  Essential hypertension: Her blood pressure is well-controlled on current medications.  I also reviewed her home blood pressure readings which are lower than the office readings.  4.  Hyperlipidemia: I reviewed labs done last month which showed an LDL of 69.  The rest of her labs were unremarkable.  Continue rosuvastatin .  5.  Aortic sclerosis: There was concern about progression to aortic stenosis.  I did review the results of echocardiogram which was done last month and showed calcified aortic valve without significant stenosis.  Ejection fraction was normal.    Disposition:   FU with me in 12 months  Signed,  Deatrice Cage, MD  02/16/2023 9:50 AM    [facility]

## 2023-03-27 ENCOUNTER — Other Ambulatory Visit: Payer: Self-pay | Admitting: Cardiovascular Disease

## 2023-04-14 ENCOUNTER — Other Ambulatory Visit: Payer: Self-pay | Admitting: Cardiovascular Disease

## 2023-04-26 ENCOUNTER — Other Ambulatory Visit: Payer: Self-pay | Admitting: Cardiovascular Disease

## 2023-05-11 ENCOUNTER — Other Ambulatory Visit: Payer: Self-pay | Admitting: *Deleted

## 2023-05-11 DIAGNOSIS — C9110 Chronic lymphocytic leukemia of B-cell type not having achieved remission: Secondary | ICD-10-CM

## 2023-05-12 ENCOUNTER — Inpatient Hospital Stay (HOSPITAL_BASED_OUTPATIENT_CLINIC_OR_DEPARTMENT_OTHER): Payer: Medicare Other | Admitting: Nurse Practitioner

## 2023-05-12 ENCOUNTER — Inpatient Hospital Stay: Payer: Medicare Other | Attending: Nurse Practitioner

## 2023-05-12 ENCOUNTER — Encounter: Payer: Self-pay | Admitting: Nurse Practitioner

## 2023-05-12 VITALS — BP 134/73 | HR 73 | Temp 97.9°F | Resp 17 | Wt 143.0 lb

## 2023-05-12 DIAGNOSIS — C9110 Chronic lymphocytic leukemia of B-cell type not having achieved remission: Secondary | ICD-10-CM | POA: Diagnosis present

## 2023-05-12 LAB — CBC WITH DIFFERENTIAL (CANCER CENTER ONLY)
Abs Immature Granulocytes: 0.08 10*3/uL — ABNORMAL HIGH (ref 0.00–0.07)
Basophils Absolute: 0.1 10*3/uL (ref 0.0–0.1)
Basophils Relative: 0 %
Eosinophils Absolute: 0.3 10*3/uL (ref 0.0–0.5)
Eosinophils Relative: 1 %
HCT: 43.4 % (ref 36.0–46.0)
Hemoglobin: 14.1 g/dL (ref 12.0–15.0)
Immature Granulocytes: 0 %
Lymphocytes Relative: 75 %
Lymphs Abs: 17.1 10*3/uL — ABNORMAL HIGH (ref 0.7–4.0)
MCH: 32.3 pg (ref 26.0–34.0)
MCHC: 32.5 g/dL (ref 30.0–36.0)
MCV: 99.5 fL (ref 80.0–100.0)
Monocytes Absolute: 0.8 10*3/uL (ref 0.1–1.0)
Monocytes Relative: 3 %
Neutro Abs: 4.8 10*3/uL (ref 1.7–7.7)
Neutrophils Relative %: 21 %
Platelet Count: 247 10*3/uL (ref 150–400)
RBC: 4.36 MIL/uL (ref 3.87–5.11)
RDW: 12.8 % (ref 11.5–15.5)
WBC Count: 23.2 10*3/uL — ABNORMAL HIGH (ref 4.0–10.5)
nRBC: 0 % (ref 0.0–0.2)

## 2023-05-12 LAB — CMP (CANCER CENTER ONLY)
ALT: 17 U/L (ref 0–44)
AST: 26 U/L (ref 15–41)
Albumin: 3.9 g/dL (ref 3.5–5.0)
Alkaline Phosphatase: 57 U/L (ref 38–126)
Anion gap: 7 (ref 5–15)
BUN: 8 mg/dL (ref 8–23)
CO2: 25 mmol/L (ref 22–32)
Calcium: 9 mg/dL (ref 8.9–10.3)
Chloride: 105 mmol/L (ref 98–111)
Creatinine: 0.65 mg/dL (ref 0.44–1.00)
GFR, Estimated: >60 mL/min (ref >60)
Glucose, Bld: 116 mg/dL — ABNORMAL HIGH (ref 70–99)
Potassium: 4.2 mmol/L (ref 3.5–5.1)
Sodium: 137 mmol/L (ref 135–145)
Total Bilirubin: 0.6 mg/dL (ref 0.0–1.2)
Total Protein: 7 g/dL (ref 6.5–8.1)

## 2023-05-12 NOTE — Progress Notes (Signed)
 [facility]  [address] [place], [place] [address] [phone]   Clinic Day:  05/12/2023  Referring physician: No ref. provider found  Chief Complaint: Cheryl Briggs is a 78 y.o. 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  HPI: Patient initially presented as 78 year old female diagnosed with CLL in approximately 2015, transitioned care to Dr Beverely Buba in 2019 after moving from [facility] to [facility]. Her WBC had been < 14,000 since diagnosis and clinically asymptomatic.   She is a retired Actor. Her daughter lives in [place].    Interval History- Patient is 78 year old female, with above history of CLL, who returns to clinic for one year assessment. She continues to feel well. Denies new lumps, fevers, chills, hot flashes, night sweats, or recurrent infections.    Past Medical History:  Diagnosis Date   Allergy    Arthritis    Cancer (HCC)    CLL   GERD (gastroesophageal reflux disease)    Myocardial infarction [facility])     Past Surgical History:  Procedure Laterality Date   BACK SURGERY     BLADDER REPAIR     CARDIAC CATHETERIZATION     carpel tunnell Bilateral    CHOLECYSTECTOMY     COLONOSCOPY  2015   COLONOSCOPY WITH PROPOFOL  N/A 10/05/2022   Procedure: COLONOSCOPY WITH PROPOFOL ;  Surgeon: Luke Salaam, MD;  Location: [facility] ENDOSCOPY;  Service: Gastroenterology;  Laterality: N/A;   CORONARY STENT INTERVENTION N/A 08/16/2019   Procedure: CORONARY STENT INTERVENTION;  Surgeon: Sammy Crisp, MD;  Location: [facility] INVASIVE CV LAB;  Service: Cardiovascular;  Laterality: N/A;   CORONARY STENT INTERVENTION Left 08/19/2019   Procedure: CORONARY STENT INTERVENTION;  Surgeon: Wenona Hamilton, MD;  Location: [facility] INVASIVE CV LAB;  Service: Cardiovascular;  Laterality: Left;   CORONARY STENT INTERVENTION N/A 02/25/2020   Procedure: CORONARY STENT INTERVENTION;  Surgeon: Sammy Crisp, MD;  Location: [facility] INVASIVE CV LAB;  Service:  Cardiovascular;  Laterality: N/A;   LEFT HEART CATH AND CORONARY ANGIOGRAPHY N/A 08/16/2019   Procedure: LEFT HEART CATH AND CORONARY ANGIOGRAPHY;  Surgeon: Sammy Crisp, MD;  Location: [facility] INVASIVE CV LAB;  Service: Cardiovascular;  Laterality: N/A;   LEFT HEART CATH AND CORONARY ANGIOGRAPHY N/A 02/25/2020   Procedure: LEFT HEART CATH AND CORONARY ANGIOGRAPHY;  Surgeon: Sammy Crisp, MD;  Location: [facility] INVASIVE CV LAB;  Service: Cardiovascular;  Laterality: N/A;   RIGHT/LEFT HEART CATH AND CORONARY ANGIOGRAPHY N/A 06/18/2020   Procedure: RIGHT/LEFT HEART CATH AND CORONARY ANGIOGRAPHY;  Surgeon: Wenona Hamilton, MD;  Location: [facility] INVASIVE CV LAB;  Service: Cardiovascular;  Laterality: N/A;   SHOULDER SURGERY Bilateral    UPPER EXTREMITY ANGIOGRAPHY Right 08/21/2019   Procedure: UPPER EXTREMITY ANGIOGRAPH;  Surgeon: Celso College, MD;  Location: [facility] INVASIVE CV LAB;  Service: Cardiovascular;  Laterality: Right;    Family History  Problem Relation Age of Onset   Heart disease Mother    Heart attack Father    Breast cancer Neg Hx     Social History:  reports that she has never smoked. She has never used smokeless tobacco. She reports that she does not drink alcohol and does not use drugs. The patient previously lived in [place], [place]  for the past 40+ years.  She moved to [place]  to be closer to her daughter. Patient is a retired Licensed conveyancer.  Allergies:  Allergies  Allergen Reactions   Corticosteroids     Headache   Other Nausea  And Vomiting    Seafood - cramping   Sulfa Antibiotics Anaphylaxis   Clarithromycin Other (See Comments)    Rectal bleeding   Cymbalta [Duloxetine Hcl] Diarrhea    Abdominal cramping    Fish Oil Other (See Comments)   Prednisone  Other (See Comments)    Color changes in face, headache and difficulty walking in high doses   Shellfish Allergy     Other Reaction(s): abdominal cramps, nausea    Current Medications: Current  Outpatient Medications  Medication Sig Dispense Refill   alendronate (FOSAMAX) 70 MG tablet Take 70 mg by mouth once a week.     aspirin  EC 81 MG tablet Take 81 mg by mouth daily. Swallow whole.     azelastine (ASTELIN) 0.1 % nasal spray Place 1 spray into both nostrils 2 (two) times daily. Use in each nostril as directed     Biotin 5000 MCG TABS 1 tablet Orally Once a day     Cholecalciferol  (VITAMIN D ) 50 MCG (2000 UT) tablet Take 2,000 Units by mouth daily. Take with 1000 unit vitamin d  to equal 3000 units daily     clopidogrel  (PLAVIX ) 75 MG tablet TAKE ONE TABLET BY MOUTH ONCE DAILY 90 tablet 3   CRANBERRY PO Take by mouth daily at 8 pm.     desonide (DESOWEN) 0.05 % ointment Apply 1 application topically daily as needed (rash around eyelids).     diphenhydrAMINE  (SOMINEX) 25 MG tablet Take by mouth at bedtime as needed.     doxycycline (MONODOX) 100 MG capsule Take 100 mg by mouth 2 (two) times daily.     fluticasone (FLONASE) 50 MCG/ACT nasal spray Place into both nostrils.     isosorbide  mononitrate (IMDUR ) 60 MG 24 hr tablet TAKE ONE TABLET BY MOUTH ONCE DAILY 90 tablet 3   metoprolol  succinate (TOPROL -XL) 50 MG 24 hr tablet TAKE 1 AND 1/2 TABLETS BY MOUTH ONCE DAILY 135 tablet 3   Naphazoline-Pheniramine (OPCON-A) 0.027-0.315 % SOLN Place 1 drop into both eyes daily.     nitroGLYCERIN  (NITROSTAT ) 0.4 MG SL tablet DISSOLVE 1 TABLET UNDER THE TONGUE EVERY 5 MINUTES AS NEEDED FOR CHEST PAIN. DO NOT EXCEED A TOTAL OF 3 DOSES IN 15 MINUTES. 25 tablet 3   omeprazole (PRILOSEC OTC) 20 MG tablet Take 20 mg by mouth daily.     rosuvastatin  (CRESTOR ) 20 MG tablet TAKE ONE TABLET BY MOUTH ONCE DAILY 90 tablet 3   traMADol-acetaminophen  (ULTRACET) 37.5-325 MG tablet Take 1 tablet by mouth 2 (two) times daily.     No current facility-administered medications for this visit.   Review of Systems  Constitutional:  Negative for chills, fever, malaise/fatigue and weight loss.  HENT:  Negative for  congestion, ear pain and tinnitus.   Eyes:  Negative for blurred vision and double vision.  Respiratory:  Negative for cough, sputum production and shortness of breath.   Cardiovascular: Negative.  Negative for chest pain, palpitations and leg swelling.  Gastrointestinal:  Negative for abdominal pain, constipation, diarrhea, nausea and vomiting.  Genitourinary:  Negative for dysuria, frequency and urgency.  Musculoskeletal:  Negative for back pain and falls.  Skin:  Negative for itching and rash.  Neurological:  Negative for weakness and headaches.  Endo/Heme/Allergies:  Does not bruise/bleed easily.  Psychiatric/Behavioral:  Negative for depression. The patient is not nervous/anxious and does not have insomnia.     Performance status (ECOG): 0  Vitals Blood pressure 134/73, pulse 73, temperature 97.9 F (36.6 C), temperature source Tympanic, resp.  rate 17, weight 143 lb (64.9 kg), SpO2 97%.   Physical Exam Vitals reviewed.  Constitutional:      Appearance: Normal appearance. She is not ill-appearing.  Cardiovascular:     Rate and Rhythm: Normal rate and regular rhythm.  Pulmonary:     Effort: Pulmonary effort is normal.  Musculoskeletal:        General: Normal range of motion.  Skin:    Coloration: Skin is not pale.  Neurological:     Mental Status: She is oriented to person, place, and time.  Psychiatric:        Behavior: Behavior normal.       Latest Ref Rng & Units 05/12/2023    8:56 AM 05/12/2022    1:01 PM 05/11/2021    1:39 PM  CBC  WBC 4.0 - 10.5 K/uL 23.2  18.5  14.9   Hemoglobin 12.0 - 15.0 g/dL 40.9  81.1  91.4   Hematocrit 36.0 - 46.0 % 43.4  43.6  43.2   Platelets 150 - 400 K/uL 247  272  249       Latest Ref Rng & Units 05/12/2023    8:56 AM 05/12/2022    1:07 PM 06/05/2020   12:02 PM  CMP  Glucose 70 - 99 mg/dL 782  956  94   BUN 8 - 23 mg/dL 8  10  7    Creatinine 0.44 - 1.00 mg/dL 2.13  0.86  5.78   Sodium 135 - 145 mmol/L 137  137  137   Potassium 3.5 -  5.1 mmol/L 4.2  4.2  4.4   Chloride 98 - 111 mmol/L 105  103  101   CO2 22 - 32 mmol/L 25  26  21    Calcium  8.9 - 10.3 mg/dL 9.0  9.2  9.3   Total Protein 6.5 - 8.1 g/dL 7.0  7.0    Total Bilirubin 0.0 - 1.2 mg/dL 0.6  0.6    Alkaline Phos 38 - 126 U/L 57  81    AST 15 - 41 U/L 26  33    ALT 0 - 44 U/L 17  21      Assessment & Plan: Mrs. Vanegas is here for follow-up for CLL.    CLL- diagnosed in 2015 in [place] . WBC has been < 14,000 since diagnosis. She has not required treatment or further workup. Clinically asymptomatic. Labs today reviewed. WBC has increased to 23.2. We reviewed indications for treatment including end organ damage, progressive bulky disease, progressive cytopenias, or significant disease related symptoms. No indications for treatment. Continue monitoring.  NSTEMI- 08/2019. followed by cardiology, Dr Alvenia Aus. Health maintenance- managed by pcp.    Disposition: 1 year- lab (cbc, cmp), see me for CLL follow up- la  I spent 15 minutes dedicated to the care of this patient (face-to-face and non-face-to-face) on the date of the encounter to include what is described in the assessment and plan.  Thank you for allowing me to participate in the care of this very pleasant patient.   Kenney Peacemaker, DNP, AGNP-C, [facility] at [facility] [phone] (clinic) 05/12/2023

## 2023-05-12 NOTE — Progress Notes (Signed)
 Patient here for followup today,concerns of fatigue, constipation and sinuitis

## 2023-08-10 ENCOUNTER — Ambulatory Visit: Attending: Family Medicine | Admitting: Physical Therapy

## 2023-08-10 ENCOUNTER — Encounter: Payer: Self-pay | Admitting: Physical Therapy

## 2023-08-10 DIAGNOSIS — R2689 Other abnormalities of gait and mobility: Secondary | ICD-10-CM | POA: Diagnosis present

## 2023-08-10 DIAGNOSIS — M62838 Other muscle spasm: Secondary | ICD-10-CM | POA: Diagnosis present

## 2023-08-10 DIAGNOSIS — M542 Cervicalgia: Secondary | ICD-10-CM | POA: Insufficient documentation

## 2023-08-10 DIAGNOSIS — R262 Difficulty in walking, not elsewhere classified: Secondary | ICD-10-CM | POA: Insufficient documentation

## 2023-08-10 NOTE — Therapy (Signed)
 OUTPATIENT PHYSICAL THERAPY NECK EVALUATION   Patient Name: Cheryl Briggs MRN: [id number] DOB:[birth year], 78 y.o., female Today's Date: 08/10/2023  END OF SESSION:  PT End of Session - 08/10/23 0820     Visit Number 1    Number of Visits 17    Date for PT Re-Evaluation 10/05/23    PT Start Time 0821    PT Stop Time 0901    PT Time Calculation (min) 40 min    Activity Tolerance Patient tolerated treatment well    Behavior During Therapy [facility] for tasks assessed/performed          Past Medical History:  Diagnosis Date   Allergy    Arthritis    Cancer (HCC)    CLL   GERD (gastroesophageal reflux disease)    Myocardial infarction [facility])    Past Surgical History:  Procedure Laterality Date   BACK SURGERY     BLADDER REPAIR     CARDIAC CATHETERIZATION     carpel tunnell Bilateral    CHOLECYSTECTOMY     COLONOSCOPY  2015   COLONOSCOPY WITH PROPOFOL  N/A 10/05/2022   Procedure: COLONOSCOPY WITH PROPOFOL ;  Surgeon: Therisa Bi, MD;  Location: [facility] ENDOSCOPY;  Service: Gastroenterology;  Laterality: N/A;   CORONARY STENT INTERVENTION N/A 08/16/2019   Procedure: CORONARY STENT INTERVENTION;  Surgeon: Mady Bruckner, MD;  Location: [facility] INVASIVE CV LAB;  Service: Cardiovascular;  Laterality: N/A;   CORONARY STENT INTERVENTION Left 08/19/2019   Procedure: CORONARY STENT INTERVENTION;  Surgeon: Darron Deatrice LABOR, MD;  Location: [facility] INVASIVE CV LAB;  Service: Cardiovascular;  Laterality: Left;   CORONARY STENT INTERVENTION N/A 02/25/2020   Procedure: CORONARY STENT INTERVENTION;  Surgeon: Mady Bruckner, MD;  Location: [facility] INVASIVE CV LAB;  Service: Cardiovascular;  Laterality: N/A;   LEFT HEART CATH AND CORONARY ANGIOGRAPHY N/A 08/16/2019   Procedure: LEFT HEART CATH AND CORONARY ANGIOGRAPHY;  Surgeon: Mady Bruckner, MD;  Location: [facility] INVASIVE CV LAB;  Service: Cardiovascular;  Laterality: N/A;   LEFT HEART CATH AND CORONARY ANGIOGRAPHY N/A 02/25/2020   Procedure: LEFT HEART  CATH AND CORONARY ANGIOGRAPHY;  Surgeon: Mady Bruckner, MD;  Location: [facility] INVASIVE CV LAB;  Service: Cardiovascular;  Laterality: N/A;   RIGHT/LEFT HEART CATH AND CORONARY ANGIOGRAPHY N/A 06/18/2020   Procedure: RIGHT/LEFT HEART CATH AND CORONARY ANGIOGRAPHY;  Surgeon: Darron Deatrice LABOR, MD;  Location: [facility] INVASIVE CV LAB;  Service: Cardiovascular;  Laterality: N/A;   SHOULDER SURGERY Bilateral    UPPER EXTREMITY ANGIOGRAPHY Right 08/21/2019   Procedure: UPPER EXTREMITY ANGIOGRAPH;  Surgeon: Marea Selinda RAMAN, MD;  Location: [facility] INVASIVE CV LAB;  Service: Cardiovascular;  Laterality: Right;   Patient Active Problem List   Diagnosis Date Noted   Colon cancer screening 10/05/2022   Myocardial infarction (HCC) 08/19/2022   Degenerative joint disease involving multiple joints on both sides of body 05/11/2021   Acute non-ST segment elevation myocardial infarction (HCC) 11/30/2020   Prediabetes 11/30/2020   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   Tachycardia 11/29/2020   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)    Dyspnea    Hyperlipidemia    Gastroesophageal reflux disease without esophagitis    Chest pain 02/25/2020   Neutrophilic leukocytosis 02/25/2020   Essential hypertension    Atrial fibrillation with RVR (HCC) 08/16/2019   NSTEMI (non-ST elevated myocardial infarction) (HCC) 08/15/2019   Hematoma 08/15/2019   CLL (chronic lymphocytic leukemia) (HCC) 03/01/2017    PCP: Katrinka Aquas, MD  REFERRING PROVIDER: Loring Lye, MD  REFERRING DIAG:  F37.161 (ICD-10-CM) - Other muscle spasm    RATIONALE FOR EVALUATION AND TREATMENT: Rehabilitation  THERAPY DIAG: Cervicalgia  ONSET DATE: 2 years of neck symptoms  FOLLOW-UP APPT SCHEDULED WITH REFERRING PROVIDER: None currently in EMR   SUBJECTIVE:                                                                                                                                                                                          Chief Complaint: Pt is a 78 year old female with primary c/o neck pain. Pt has referral for neck pain/spasm and balance - no Hx of falls. Pt reports being unbalanced.  Pertinent History Pt is a 78 year old female with primary c/o neck pain. Atraumatic onset. Pt has referral for neck pain/spasm and balance - no Hx of falls. Pt reports being unbalanced. Pt reports being told by referring provider he thought neck was result of muscle spasm. Hx of L RCR x 3. Patient reports not being as strong in L arm/shoulder. Pt reports difficulty with turning her head. Pt reports she has to hold her head still. L>R-sided neck pain. Pt reports pain/tingling along L suboccipital region with referral in ram's horn pattern around L ear. Pt reports trying to massage neck without relief.   Pain:  Pain Intensity: Present: 5/10, Best: 0/10, Worst: 10/10 Pain location: L SCM/L cervical paraspinal musculature  Pain Quality: aching  Radiating: Yes, around L suboccipital/peri-aural region Numbness/Tingling: Yes; into L suboccipital region, L hand can go numb with holding elbow in flexed position lon enough  Focal Weakness: No Aggravating factors: L rotation worse than R rotation, overhead activity  Relieving factors: Biofreeze, heat, refraining from notable movement 24-hour pain behavior: None  History of prior neck injury, pain, surgery, or therapy: Yes; Right RCR x 1, L RCR x 3; pt given home exercise sheet in past  Dominant hand: right Imaging: No   Red flags (personal history of cancer, h/o spinal tumors, history of compression fracture, chills/fever, night sweats, nausea, vomiting, unrelenting pain):  Hx of chronic lymphcytic leukemia  PRECAUTIONS: None  WEIGHT BEARING RESTRICTIONS: No  FALLS: Has patient fallen in last 6 months? No  Living Environment Lives with: lives alone, daughter lives next door  Lives in: House/apartment Stairs: Yes: External: 5-6 steps; can reach  both; one-level inside of home  (10-12 steps in back of house)  One level inside of home   Tub shower  Pt has shower chair  Has following equipment at home: Vannie - 2 wheeled (used after back surgery) no walking aid used now  Prior level of function: Independent  Occupational demands: Retired   Hobbies: Getting to her storage  building to work outside of her home   Patient Goals: Help my neck   OBJECTIVE:   Patient Surveys  NDI = 17/50 = 34%  Cognition Patient is oriented to person, place, and time.  Recent memory is intact.  Remote memory is intact.  Attention span and concentration are intact.  Expressive speech is intact.  Patient's fund of knowledge is within normal limits for educational level.    Gross Musculoskeletal Assessment Tremor: None Bulk: Normal Tone: Normal  Gait Normal step cadence, shortened stride length, mild forward flexed posture   Posture L scapula sits higher than R at rest  AROM AROM (Normal range in degrees) AROM 08/10/23  Cervical  Flexion (50) 28*  Extension (80) 30  Right lateral flexion (45) 8  Left lateral flexion (45) 12  Right rotation (85) 20  Left rotation (85) 20  (* = pain; Blank rows = not tested)   Shoulder AROM: Flexion R 130 L 120; ABD 130 bilat; Functional ER: R T2, L C7; Functional IR: R T12, L T12   MMT MMT (out of 5) Right 08/10/23 Left 08/10/23      Shoulder   Flexion 4- 4-  Extension    Abduction 4- 4- (mild pain L arm)  Internal rotation 5 5  External rotation 4- 4-  Horizontal abduction    Horizontal adduction    Lower Trapezius    Rhomboids        Elbow  Flexion 5 5  Extension 4+ 4+  Pronation    Supination        Wrist  Flexion    Extension 5 5  Radial deviation    Ulnar deviation        (* = pain; Blank rows = not tested)  Sensation Deferred  Reflexes R/L Elbow: 2+/1+  Brachioradialis: 2+/2+  Tricep: 2+/1+  Palpation Location LEFT  RIGHT           Suboccipitals     Cervical paraspinals 1 1  Upper Trapezius 2 2  Levator Scapulae 2 2  Rhomboid Major/Minor 2 2  (Blank rows = not tested) Graded on 0-4 scale (0 = no pain, 1 = pain, 2 = pain with wincing/grimacing/flinching, 3 = pain with withdrawal, 4 = unwilling to allow palpation), (Blank rows = not tested)  Repeated Movements Deferred  Passive Accessory Intervertebral Motion Deferred   SPECIAL TESTS Spurlings A (ipsilateral lateral flexion/axial compression): R: Localized neck pain, no upper limb pain/paresthesias,  L: Localized neck pain, no upper limb pain/paresthesias Distraction Test: Negative  Hoffman Sign (cervical cord compression): R: Not examined L: Not examined ULTT Median: R: Not examined L: Not examined ULTT Ulnar: R: Not examined L: Not examined ULTT Radial: R: Not examined L: Not examined   OUTCOME MEASURES 5TSTS: 10.1 sec TUG: 9.2     TODAY'S TREATMENT    Therapeutic Exercise - for HEP establishment, discussion on appropriate exercise/activity modification, PT education   Reviewed baseline home exercises and provided handout for MedBridge program (see Access Code); tactile cueing and therapist demonstration utilized as needed for carryover of proper technique to HEP.    Patient education on current condition, anatomy involved, prognosis, plan of care. Discussed with pt fall risk management and considering use of AD/SPC.     PATIENT EDUCATION:  Education details: see above for patient education details Person educated: Patient Education method: Explanation, Demonstration, and Handouts Education comprehension: verbalized understanding and returned demonstration   HOME EXERCISE PROGRAM:  Access Code: [email or web address] URL: [email or web address] Date: 08/10/2023  Prepared by: Venetia Endo  Exercises - Seated Upper Trapezius Stretch  - 2 x daily - 7 x weekly - 3 sets - 30sec hold - Seated Levator Scapulae Stretch  - 2 x daily - 7 x weekly - 3 sets - 30sec  hold - Seated Scapular Retraction  - 2 x daily - 7 x weekly - 2 sets - 10 reps - 5sec hold  ASSESSMENT:  CLINICAL IMPRESSION: Patient is a 78 y.o. female who was seen today for physical therapy evaluation and treatment for neck pain and stiffness/motion loss and Hx of imbalance. Pt has primary complaint of neck stiffness and no significant recent fall history. We focused on upper quarter screen and cervical spine with discussion on home setup and fall risk management and initial fall risk screening measures completed in brief timeframe. Pt does not have clinical presentation consistent with radiculopathy. Pt has largely myofascial pain with moderate referred pain to UT/LS/rhomboid region and tension HA with ram's horn referral pattern. Pt has current deficits in cervical spine and shoulder AROM, postural dysfunction, taut/tender L>R UT/LS, C-spine stiffness, and dec UE strength. Pt will continue to benefit from skilled PT services to address deficits and improve function.   OBJECTIVE IMPAIRMENTS: decreased ROM, decreased strength, hypomobility, increased muscle spasms, impaired flexibility, impaired UE functional use, postural dysfunction, and pain.   ACTIVITY LIMITATIONS: carrying, lifting, bending, and reach over head  PARTICIPATION LIMITATIONS: meal prep, cleaning, laundry, community activity, and yard work  PERSONAL FACTORS: Age, Past/current experiences, Time since onset of injury/illness/exacerbation, and 3+ comorbidities: (Hx of MI, GERD, OA, chronic lymphocytic leukemia)  are also affecting patient's functional outcome.   REHAB POTENTIAL: Good  CLINICAL DECISION MAKING: Evolving/moderate complexity  EVALUATION COMPLEXITY: Moderate   GOALS: Goals reviewed with patient? Yes  SHORT TERM GOALS: Target date: 08/31/2023  Pt will be independent with HEP to improve strength and decrease neck pain to improve pain-free function at home and work. Baseline: 08/10/23: Baseline HEP initiated.   Goal status: INITIAL   LONG TERM GOALS: Target date: 10/05/2023  Pt will demonstrate cervical spine flexion to 50 deg and bilat rotation to 60 deg or greater as needed for scanning environment and functional ROM Baseline: 08/10/23: Significant motion loss (see chart above) Goal status: INITIAL  2.  Pt will decrease worst neck pain by at least 2 points on the NPRS in order to demonstrate clinically significant reduction in neck pain. Baseline: 08/10/23:  Goal status: INITIAL  3.  Pt will decrease NDI score by at least 19% in order demonstrate clinically significant reduction in neck pain/disability.       Baseline: 08/10/23: 34% Goal status: INITIAL  4.  Pt will improve BERG by 3 points or greater indicative of clinically meaningful improvement in pt's balance and fall risk Baseline: 08/10/23: BERG to be completed at visit # 2-3.  Goal status: INITIAL   PLAN: PT FREQUENCY: 1-2x/week  PT DURATION: 6 weeks  PLANNED INTERVENTIONS: Therapeutic exercises, Therapeutic activity, Neuromuscular re-education, Balance training, Gait training, Patient/Family education, Self Care, Joint mobilization, Joint manipulation, Vestibular training, Canalith repositioning, Orthotic/Fit training, DME instructions, Dry Needling, Electrical stimulation, Spinal manipulation, Spinal mobilization, Cryotherapy, Moist heat, Taping, Traction, Ultrasound, Ionotophoresis 4mg /ml Dexamethasone, Manual therapy, and Re-evaluation.  PLAN FOR NEXT SESSION: Manual techniques for L>R upper trap, levator scapulae, rhomboid musculature. MET and mobilization techniques to improve C-spine AROM. Complete BERG at future date. Provide HEP to promote improved balance and fall risk at future date.    Venetia Endo, PT, DPT [phone]  Venetia ONEIDA Endo, PT 08/10/2023, 8:24 AM

## 2023-08-16 ENCOUNTER — Encounter: Payer: Self-pay | Admitting: Physical Therapy

## 2023-08-16 ENCOUNTER — Ambulatory Visit: Attending: Family Medicine | Admitting: Physical Therapy

## 2023-08-16 DIAGNOSIS — R2689 Other abnormalities of gait and mobility: Secondary | ICD-10-CM | POA: Diagnosis present

## 2023-08-16 DIAGNOSIS — M62838 Other muscle spasm: Secondary | ICD-10-CM | POA: Diagnosis present

## 2023-08-16 DIAGNOSIS — R262 Difficulty in walking, not elsewhere classified: Secondary | ICD-10-CM | POA: Insufficient documentation

## 2023-08-16 DIAGNOSIS — M542 Cervicalgia: Secondary | ICD-10-CM | POA: Diagnosis present

## 2023-08-16 NOTE — Therapy (Unsigned)
 OUTPATIENT PHYSICAL THERAPY TREATMENT   Patient Name: Cheryl Briggs MRN: [id number] DOB:12-14-1945, 78 y.o., female Today's Date: 08/16/2023  END OF SESSION:  PT End of Session - 08/16/23 1407     Visit Number 2    Number of Visits 17    Date for PT Re-Evaluation 10/05/23    PT Start Time 1414    PT Stop Time 1457    PT Time Calculation (min) 43 min    Activity Tolerance Patient tolerated treatment well    Behavior During Therapy WFL for tasks assessed/performed           Past Medical History:  Diagnosis Date   Allergy    Arthritis    Cancer (HCC)    CLL   GERD (gastroesophageal reflux disease)    Myocardial infarction [facility])    Past Surgical History:  Procedure Laterality Date   BACK SURGERY     BLADDER REPAIR     CARDIAC CATHETERIZATION     carpel tunnell Bilateral    CHOLECYSTECTOMY     COLONOSCOPY  2015   COLONOSCOPY WITH PROPOFOL  N/A 10/05/2022   Procedure: COLONOSCOPY WITH PROPOFOL ;  Surgeon: Therisa Bi, MD;  Location: [facility] ENDOSCOPY;  Service: Gastroenterology;  Laterality: N/A;   CORONARY STENT INTERVENTION N/A 08/16/2019   Procedure: CORONARY STENT INTERVENTION;  Surgeon: Mady Bruckner, MD;  Location: [facility] INVASIVE CV LAB;  Service: Cardiovascular;  Laterality: N/A;   CORONARY STENT INTERVENTION Left 08/19/2019   Procedure: CORONARY STENT INTERVENTION;  Surgeon: Darron Deatrice LABOR, MD;  Location: [facility] INV[facility] CV [facility];  Service: Cardiovascular;  Laterality: Left;   CORONARY STENT INTERVENTION N/A 02/25/2020   Procedure: CORONARY STENT INTERVENTION;  Surgeon: Mady Bruckner, MD;  Location: [facility] INV[facility] CV [facility];  Service: Cardiovascular;  Laterality: N/A;   LEFT HEART CATH AND CORONARY ANGIOGRAPHY N/A 08/16/2019   Procedure: LEFT HEART CATH AND CORONARY ANGIOGRAPHY;  Surgeon: Mady Bruckner, MD;  Location: [facility] IN[facility] CV [facility];  Service: Cardiovascular;  Laterality: N/A;   LEFT HEART CATH AND CORONARY ANGIOGRAPHY N/A 02/25/2020   Procedure: LEFT HEART CATH  AND CORONARY ANGIOGRAPHY;  Surgeon: Mady Bruckner, MD;  Location: [facility] INV[facility] CV [facility];  Service: Cardiovascular;  Laterality: N/A;   RIGHT/LEFT HEART CATH AND CORONARY ANGIOGRAPHY N/A 06/18/2020   Procedure: RIGHT/LEFT HEART CATH AND CORONARY ANGIOGRAPHY;  Surgeon: Darron Deatrice LABOR, MD;  Location: [facility] IN[facility] CV [facility];  Service: Cardiovascular;  Laterality: N/A;   SHOULDER SURGERY Bilateral    UPPER EXTREMITY ANGIOGRAPHY Right 08/21/2019   Procedure: UPPER EXTREMITY ANGIOGRAPH;  Surgeon: Marea Selinda RAMAN, MD;  Location: [facility] INV[facility] CV [facility];  Service: Cardiovascular;  Laterality: Right;   Patient Active Problem List   Diagnosis Date Noted   Colon cancer screening 10/05/2022   Myocardial infarction (HCC) 08/19/2022   Degenerative joint disease involving multiple joints on both sides of body 05/11/2021   Acute non-ST segment elevation myocardial infarction (HCC) 11/30/2020   Prediabetes 11/30/2020   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   Tachycardia 11/29/2020   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)    Dyspnea    Hyperlipidemia    Gastroesophageal reflux disease without esophagitis    Chest pain 02/25/2020   Neutrophilic leukocytosis 02/25/2020   Essential hypertension    Atrial fibrillation with RVR (HCC) 08/16/2019   NSTEMI (non-ST elevated myocardial infarction) (HCC) 08/15/2019   Hematoma 08/15/2019   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 Atraumatic  onset. Pt has referral for neck pain/spasm and balance - no Hx of falls. Pt reports being unbalanced. Pt reports being told by referring provider he thought neck was result of muscle spasm. Hx of L RCR x 3. Patient reports not being as strong in L arm/shoulder. Pt reports difficulty with turning her head. Pt reports she has to hold her head still. L>R-sided neck pain. Pt reports pain/tingling along L suboccipital region with referral in ram's horn pattern around L ear. Pt reports trying to massage neck without relief.   Pain:  Pain Intensity: Present: 5/10, Best: 0/10, Worst: 10/10 Pain location: L SCM/L cervical paraspinal musculature  Pain Quality: aching  Radiating: Yes, around L suboccipital/peri-aural region Numbness/Tingling: Yes; into L suboccipital region, L hand can go numb with holding elbow in flexed position lon enough  Focal Weakness: No Aggravating factors: L rotation worse than R rotation, overhead activity  Relieving factors: Biofreeze, heat, refraining from notable movement 24-hour pain behavior: None  History of prior neck injury, pain, surgery, or therapy: Yes; Right RCR x 1, L RCR x 3; pt given home exercise sheet in past  Dominant hand: right Imaging: No   Red flags (personal history of cancer, h/o spinal tumors, history of compression fracture, chills/fever, night sweats, nausea, vomiting, unrelenting pain):  Hx of chronic lymphcytic leukemia  PRECAUTIONS: None  WEIGHT BEARING RESTRICTIONS: No  FALLS: Has patient fallen in last 6 months? 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   Patient Goals: Help my neck     OBJECTIVE (data from initial evaluation unless otherwise dated):    Patient Surveys  NDI = 17/50 = 34%  Gait Normal step cadence, shortened stride length, mild forward flexed posture   Posture L scapula sits higher than R at rest  AROM AROM (Normal range in degrees) AROM 08/10/23  Cervical  Flexion (50) 28*  Extension (80) 30  Right lateral flexion (45) 8  Left lateral flexion (45) 12  Right rotation (85) 20  Left rotation (85) 20  (* = pain; Blank rows = not tested)   Shoulder AROM: Flexion R 130 L 120; ABD 130 bilat; Functional ER: R T2, L C7; Functional IR: R T12, L T12   MMT MMT (out of 5) Right 08/10/23 Left 08/10/23      Shoulder   Flexion 4- 4-  Extension    Abduction 4- 4- (mild pain L arm)  Internal rotation 5 5  External rotation 4- 4-  Horizontal abduction    Horizontal adduction    Lower Trapezius    Rhomboids        Elbow  Flexion 5 5  Extension 4+ 4+  Pronation    Supination        Wrist  Flexion    Extension 5 5  Radial deviation    Ulnar deviation        (* = pain; Blank rows = not tested)  Reflexes R/L Elbow: 2+/1+  Brachioradialis: 2+/2+  Tricep: 2+/1+  Palpation Location LEFT  RIGHT           Suboccipitals    Cervical paraspinals 1 1  Upper Trapezius 2 2  Levator Scapulae 2 2  Rhomboid Major/Minor 2 2  (Blank rows = not tested) Graded on 0-4 scale (0 = no pain, 1 = pain, 2 = pain with wincing/grimacing/flinching, 3 = pain with withdrawal, 4 = unwilling to allow palpation), (Blank rows = not tested)  Repeated Movements Deferred  Passive Accessory Intervertebral Motion ***   SPECIAL TESTS Spurlings A (ipsilateral lateral flexion/axial compression): R: Localized neck pain, no upper limb pain/paresthesias,  L: Localized neck pain, no upper limb pain/paresthesias Distraction Test: Negative   Hoffman Sign (cervical cord compression): R: Not examined L: Not examined ULTT Median: R: Not examined L: Not examined ULTT Ulnar: R: Not examined L: Not examined ULTT Radial: R: Not examined L: Not examined   OUTCOME MEASURES 5TSTS: 10.1 sec TUG: 9.2    BERG: Next visit    TODAY'S TREATMENT    08/16/23   SUBJECTIVE STATEMENT:   Patient reports    Manual Therapy - for symptom modulation, soft tissue sensitivity and mobility, joint mobility, ROM   STM/DTM L>R upper trap, levator scapulae, C3-6 splenius cervicis/capitis Suboccipital STM; x 3 minutes      Therapeutic Exercise - for improved soft tissue flexibility and extensibility as needed for ROM, improved strength as needed to improve performance of CKC activities/functional movements    Neuromuscular Re-education - for improved sensory integration, static and dynamic postural control, equilibrium and non-equilibrium coordination as needed for negotiating home and community environment and stepping over obstacles       PATIENT EDUCATION:  Education details: see above for patient education details Person educated: Patient Education method: Explanation, Demonstration, and Handouts Education comprehension: verbalized understanding and returned demonstration   HOME EXERCISE PROGRAM:  Access Code: [email or web address] URL: [email or web address] Date: 08/16/2023 Prepared by: Venetia Endo  Exercises - Seated Upper Trapezius Stretch  - 2 x daily - 7 x weekly - 3 sets - 30sec hold - Seated Levator Scapulae Stretch  - 2 x daily - 7 x weekly - 3 sets - 30sec hold - Seated Scapular Retraction  - 2 x daily - 7 x weekly - 2 sets - 10 reps - 5sec hold - Seated Assisted Cervical Rotation with Towel  - 2 x daily - 7 x weekly - 2 sets - 10 reps - 1sec hold  ASSESSMENT:  CLINICAL IMPRESSION: Patient has notable sensitivity along L>R upper trap more so than levator scapulae. ***. Pt has largely myofascial pain with  moderate referred pain to UT/LS/rhomboid region and tension HA with ram's horn referral pattern. Pt has current deficits in cervical spine and shoulder AROM, postural dysfunction, taut/tender L>R UT/LS, C-spine stiffness, and dec UE strength. Pt will continue to benefit from skilled PT services to address deficits and improve function.   OBJECTIVE IMPAIRMENTS: decreased ROM, decreased strength, hypomobility, increased muscle spasms, impaired flexibility, impaired UE functional use, postural dysfunction, and pain.   ACTIVITY LIMITATIONS: carrying, lifting, bending, and reach over head  PARTICIPATION LIMITATIONS: meal prep, cleaning, laundry, community activity,  and yard work  PERSONAL FACTORS: Age, Past/current experiences, Time since onset of injury/illness/exacerbation, and 3+ comorbidities: (Hx of MI, GERD, OA, chronic lymphocytic leukemia)  are also affecting patient's functional outcome.   REHAB POTENTIAL: Good  CLINICAL DECISION MAKING: Evolving/moderate complexity  EVALUATION COMPLEXITY: Moderate   GOALS: Goals reviewed with patient? Yes  SHORT TERM GOALS: Target date: 08/31/2023  Pt will be independent with HEP to improve strength and decrease neck pain to improve pain-free function at home and work. Baseline: 08/10/23: Baseline HEP initiated.  Goal status: INITIAL   LONG TERM GOALS: Target date: 10/05/2023  Pt will demonstrate cervical spine flexion to 50 deg and bilat rotation to 60 deg or greater as needed for scanning environment and functional ROM Baseline: 08/10/23: Significant motion loss (see chart above) Goal status: INITIAL  2.  Pt will decrease worst neck pain by at least 2 points on the NPRS in order to demonstrate clinically significant reduction in neck pain. Baseline: 08/10/23:  Goal status: INITIAL  3.  Pt will decrease NDI score by at least 19% in order demonstrate clinically significant reduction in neck pain/disability.       Baseline: 08/10/23: 34% Goal  status: INITIAL  4.  Pt will improve BERG by 3 points or greater indicative of clinically meaningful improvement in pt's balance and fall risk Baseline: 08/10/23: BERG to be completed at visit # 2-3.     08/16/23: ***  Goal status: INITIAL   PLAN: PT FREQUENCY: 1-2x/week  PT DURATION: 6 weeks  PLANNED INTERVENTIONS: Therapeutic exercises, Therapeutic activity, Neuromuscular re-education, Balance training, Gait training, Patient/Family education, Self Care, Joint mobilization, Joint manipulation, Vestibular training, Canalith repositioning, Orthotic/Fit training, DME instructions, Dry Needling, Electrical stimulation, Spinal manipulation, Spinal mobilization, Cryotherapy, Moist heat, Taping, Traction, Ultrasound, Ionotophoresis 4mg /ml Dexamethasone, Manual therapy, and Re-evaluation.  PLAN FOR NEXT SESSION: Manual techniques for L>R upper trap, levator scapulae, rhomboid musculature. MET and mobilization techniques to improve C-spine AROM. Complete BERG at future date. Provide HEP to promote improved balance and fall risk at future date.    Venetia Endo, PT, DPT #[id number]  Venetia ONEIDA Endo, PT 08/16/2023, 2:42 PM

## 2023-08-17 ENCOUNTER — Ambulatory Visit: Admitting: Physical Therapy

## 2023-08-21 ENCOUNTER — Ambulatory Visit: Admitting: Physical Therapy

## 2023-08-21 DIAGNOSIS — R2689 Other abnormalities of gait and mobility: Secondary | ICD-10-CM

## 2023-08-21 DIAGNOSIS — R262 Difficulty in walking, not elsewhere classified: Secondary | ICD-10-CM

## 2023-08-21 DIAGNOSIS — M542 Cervicalgia: Secondary | ICD-10-CM | POA: Diagnosis not present

## 2023-08-21 DIAGNOSIS — M62838 Other muscle spasm: Secondary | ICD-10-CM

## 2023-08-21 NOTE — Therapy (Signed)
 OUTPATIENT PHYSICAL THERAPY TREATMENT   Patient Name: Cheryl Briggs MRN: [id number] DOB:1945-06-09, 78 y.o., female Today's Date: 08/21/2023  END OF SESSION:  PT End of Session - 08/21/23 0902     Visit Number 3    Number of Visits 17    Date for PT Re-Evaluation 10/05/23    PT Start Time 0902    PT Stop Time 0942    PT Time Calculation (min) 40 min    Activity Tolerance Patient tolerated treatment well    Behavior During Therapy [facility] for tasks assessed/performed          Past Medical History:  Diagnosis Date   Allergy    Arthritis    Cancer (HCC)    CLL   GERD (gastroesophageal reflux disease)    Myocardial infarction [facility])    Past Surgical History:  Procedure Laterality Date   BACK SURGERY     BLADDER REPAIR     CARDIAC CATHETERIZATION     carpel tunnell Bilateral    CHOLECYSTECTOMY     COLONOSCOPY  2015   COLONOSCOPY WITH PROPOFOL  N/A 10/05/2022   Procedure: COLONOSCOPY WITH PROPOFOL ;  Surgeon: Therisa Bi, MD;  Location: [facility] ENDOSCOPY;  Service: Gastroenterology;  Laterality: N/A;   CORONARY STENT INTERVENTION N/A 08/16/2019   Procedure: CORONARY STENT INTERVENTION;  Surgeon: Mady Bruckner, MD;  Location: [facility] INVASIVE CV LAB;  Service: Cardiovascular;  Laterality: N/A;   CORONARY STENT INTERVENTION Left 08/19/2019   Procedure: CORONARY STENT INTERVENTION;  Surgeon: Darron Deatrice LABOR, MD;  Location: [facility] INVASIVE CV LAB;  Service: Cardiovascular;  Laterality: Left;   CORONARY STENT INTERVENTION N/A 02/25/2020   Procedure: CORONARY STENT INTERVENTION;  Surgeon: Mady Bruckner, MD;  Location: [facility] INVASIVE CV LAB;  Service: Cardiovascular;  Laterality: N/A;   LEFT HEART CATH AND CORONARY ANGIOGRAPHY N/A 08/16/2019   Procedure: LEFT HEART CATH AND CORONARY ANGIOGRAPHY;  Surgeon: Mady Bruckner, MD;  Location: [facility] INVASIVE CV LAB;  Service: Cardiovascular;  Laterality: N/A;   LEFT HEART CATH AND CORONARY ANGIOGRAPHY N/A 02/25/2020   Procedure: LEFT HEART CATH AND  CORONARY ANGIOGRAPHY;  Surgeon: Mady Bruckner, MD;  Location: [facility] INVASIVE CV LAB;  Service: Cardiovascular;  Laterality: N/A;   RIGHT/LEFT HEART CATH AND CORONARY ANGIOGRAPHY N/A 06/18/2020   Procedure: RIGHT/LEFT HEART CATH AND CORONARY ANGIOGRAPHY;  Surgeon: Darron Deatrice LABOR, MD;  Location: [facility] INVASIVE CV LAB;  Service: Cardiovascular;  Laterality: N/A;   SHOULDER SURGERY Bilateral    UPPER EXTREMITY ANGIOGRAPHY Right 08/21/2019   Procedure: UPPER EXTREMITY ANGIOGRAPH;  Surgeon: Marea Selinda RAMAN, MD;  Location: [facility] INVASIVE CV LAB;  Service: Cardiovascular;  Laterality: Right;   Patient Active Problem List   Diagnosis Date Noted   Colon cancer screening 10/05/2022   Myocardial infarction (HCC) 08/19/2022   Degenerative joint disease involving multiple joints on both sides of body 05/11/2021   Acute non-ST segment elevation myocardial infarction [facility]) 11/30/2020   Prediabetes 11/30/2020   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   Tachycardia 11/29/2020   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)    Dyspnea    Hyperlipidemia    Gastroesophageal reflux disease without esophagitis    Chest pain 02/25/2020   Neutrophilic leukocytosis 02/25/2020   Essential hypertension    Atrial fibrillation with RVR (HCC) 08/16/2019   NSTEMI (non-ST elevated myocardial infarction) (HCC) 08/15/2019   Hematoma 08/15/2019   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 Atraumatic  onset. Pt has referral for neck pain/spasm and balance - no Hx of falls. Pt reports being unbalanced. Pt reports being told by referring provider he thought neck was result of muscle spasm. Hx of L RCR x 3. Patient reports not being as strong in L arm/shoulder. Pt reports difficulty with turning her head. Pt reports she has to hold her head still. L>R-sided neck pain. Pt reports pain/tingling along L suboccipital region with referral in ram's horn pattern around L ear. Pt reports trying to massage neck without relief.   Pain:  Pain Intensity: Present: 5/10, Best: 0/10, Worst: 10/10 Pain location: L SCM/L cervical paraspinal musculature  Pain Quality: aching  Radiating: Yes, around L suboccipital/peri-aural region Numbness/Tingling: Yes; into L suboccipital region, L hand can go numb with holding elbow in flexed position lon enough  Focal Weakness: No Aggravating factors: L rotation worse than R rotation, overhead activity  Relieving factors: Biofreeze, heat, refraining from notable movement 24-hour pain behavior: None  History of prior neck injury, pain, surgery, or therapy: Yes; Right RCR x 1, L RCR x 3; pt given home exercise sheet in past  Dominant hand: right Imaging: No   Red flags (personal history of cancer, h/o spinal tumors, history of compression fracture, chills/fever, night sweats, nausea, vomiting, unrelenting pain):  Hx of chronic lymphcytic leukemia  PRECAUTIONS: None  WEIGHT BEARING RESTRICTIONS: No  FALLS: Has patient fallen in last 6 months? 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   Patient Goals: Help my neck     OBJECTIVE (data from initial evaluation unless otherwise dated):    Patient Surveys  NDI = 17/50 = 34%  Posture L scapula sits higher than R at rest  AROM AROM (Normal range in degrees) AROM 08/10/23  Cervical  Flexion (50) 28*  Extension (80) 30  Right lateral flexion (45) 8  Left lateral flexion (45) 12  Right rotation (85) 20  Left rotation (85) 20  (* = pain; Blank rows = not tested)   Shoulder AROM: Flexion R 130 L 120; ABD 130 bilat; Functional ER: R T2, L C7; Functional IR: R T12, L T12   MMT MMT (out of 5) Right 08/10/23 Left 08/10/23      Shoulder   Flexion 4- 4-  Extension    Abduction 4- 4- (mild pain L arm)  Internal rotation 5 5  External rotation 4- 4-  Horizontal abduction    Horizontal adduction    Lower Trapezius    Rhomboids        Elbow  Flexion 5 5  Extension 4+ 4+  Pronation    Supination  Wrist  Flexion    Extension 5 5  Radial deviation    Ulnar deviation        (* = pain; Blank rows = not tested)  Reflexes R/L Elbow: 2+/1+  Brachioradialis: 2+/2+  Tricep: 2+/1+  Palpation Location LEFT  RIGHT           Suboccipitals    Cervical paraspinals 1 1  Upper Trapezius 2 2  Levator Scapulae 2 2  Rhomboid Major/Minor 2 2  (Blank rows = not tested) Graded on 0-4 scale (0 = no pain, 1 = pain, 2 = pain with wincing/grimacing/flinching, 3 = pain with withdrawal, 4 = unwilling to allow palpation), (Blank rows = not tested)  Repeated Movements Deferred   Passive Accessory Intervertebral Motion Decreased C3-6 R to L sideglide, Decreased C3-5 L to R sideglide.     SPECIAL TESTS Spurlings A (ipsilateral lateral flexion/axial compression): R: Localized neck pain, no upper limb pain/paresthesias,  L: Localized neck pain, no upper limb pain/paresthesias Distraction Test: Negative  Hoffman Sign  (cervical cord compression): R: Not examined L: Not examined ULTT Median: R: Not examined L: Not examined ULTT Ulnar: R: Not examined L: Not examined ULTT Radial: R: Not examined L: Not examined   OUTCOME MEASURES 5TSTS: 10.1 sec TUG: 9.2    BERG: 49/56 (08/21/23)   ____________________________    Clinical Test of Sensory Interaction for Balance    (CTSIB):  CONDITION TIME STRATEGY SWAY  Eyes open, firm surface 30 seconds ankle   Eyes closed, firm surface 30 seconds ankle   Eyes open, foam surface 30 seconds ankle   Eyes closed, foam surface 30 seconds ankle      FUNCTIONAL TASKS  Gait: Normal step cadence, shortened stride length, mild forward flexed posture  Sit to stand: Readily performed with no upper limb support on first attempted, no heavy trunk lean   Stair negotiation: Safe reciprocal stair negotiation, no UE support needed    LE Strength* R/L 4/4- Hip flexion 4+/4+ Hip abduction (seated) 5/5 Hip adduction (seated) 4+/4+ Knee extension 5/5 Knee flexion 4-/4 Ankle Dorsiflexion 4/4 Ankle Plantarflexion *indicates pain     TODAY'S TREATMENT    08/21/2023   SUBJECTIVE STATEMENT:   Patient reports notable neck pain at arrival. Patient reports feeling good waking up next morning after PT last week. Patient reports doing okay with her HEP. Patient reports no recent near-falls or safety concerns.   Manual Therapy - for symptom modulation, soft tissue sensitivity and mobility, joint mobility, ROM   STM/DTM L>R upper trap, levator scapulae, C3-6 splenius cervicis/capitis x 6 minutes  TPR bilat UT, LS; x 3 minute bouts for each    *not today* Suboccipital STM; x 5 minutes MET for cervical rotation with isometric contraction of antagonist muscle group; x 5 for R and L rotation    Therapeutic Exercise - for improved soft tissue flexibility and extensibility as needed for ROM, periscapular isometrics and isotonics to improve postural endurance; strengthening as  needed for power production to prevent fall during episode of LOB  Sit to stand; reviewed  Standing HR/TR; 1 x 10 - for HEP review  Manual muscle testing* for LE (see above)   PATIENT EDUCATION: Reviewed HEP and provided new/separate Med[employer] handout for LE strengthening and postural control training.    *not today* Self-SNAG with towel for cervical spine rotation; x 10 ea dir, 1 sec hold at motion barrier   Neuromuscular Re-education - for improved sensory integration, static and dynamic postural control, equilibrium and  non-equilibrium coordination as needed for negotiating home and community environment and stepping over obstacles  BERG and mCTSIB performance;  -discussed results obtained today and pt being above fall risk cut-off scores for testing to date    PATIENT EDUCATION:  Education details: see above for patient education details Person educated: Patient Education method: Explanation, Demonstration, and Handouts Education comprehension: verbalized understanding and returned demonstration   HOME EXERCISE PROGRAM:  Access Code: [email or web address] URL: [email or web address] Date: 08/16/2023 Prepared by: Venetia Endo  Exercises - Seated Upper Trapezius Stretch  - 2 x daily - 7 x weekly - 3 sets - 30sec hold - Seated Levator Scapulae Stretch  - 2 x daily - 7 x weekly - 3 sets - 30sec hold - Seated Scapular Retraction  - 2 x daily - 7 x weekly - 2 sets - 10 reps - 5sec hold - Seated Assisted Cervical Rotation with Towel  - 2 x daily - 7 x weekly - 2 sets - 10 reps - 1sec hold  Access Code: [email or web address] URL: [email or web address] Date: 08/21/2023 Prepared by: Venetia Endo  Exercises - Heel Toe Raises with Counter Support  - 2 x daily - 7 x weekly - 2-3 sets - 10-15 reps - Sit to Stand Without Arm Support  - 2 x daily - 7 x weekly - 2-3 sets - 10 reps - Standing Tandem Balance with Counter Support  - 2 x daily - 7 x weekly - 3 sets - 30sec  hold   ASSESSMENT:  CLINICAL IMPRESSION: We had brief time to spend on manual therapy for paracervical muscle tightness/sensitivity. Pt likely also has overuse and tightness of scapular elevators from shoulder hiking compensatory hiking in setting of previous RTC repairs and poor rehab progress. We completed further fall risk and balance testing as well as postural control testing. Pt has excellent mCTSIB performance and BERG > cut-off score of 45. She is in moderate fall risk category per BERG and meets cut-off for TUG and 5TSTS. We discussed strategies for maintaining safe home environment and use of SPC/unilateral walking aid for novel or uneven terrain. Pt was given HEP printout for LE strengthening and postural control exercise. Pt has primary complaint of upper quarter/neck pain - we will focus on these concerns moving forward in the clinic. Pt has current deficits in cervical spine and shoulder AROM, postural dysfunction, taut/tender L>R UT/LS, C-spine stiffness, and dec UE strength. Pt will continue to benefit from skilled PT services to address deficits and improve function.   OBJECTIVE IMPAIRMENTS: decreased ROM, decreased strength, hypomobility, increased muscle spasms, impaired flexibility, impaired UE functional use, postural dysfunction, and pain.   ACTIVITY LIMITATIONS: carrying, lifting, bending, and reach over head  PARTICIPATION LIMITATIONS: meal prep, cleaning, laundry, community activity, and yard work  PERSONAL FACTORS: Age, Past/current experiences, Time since onset of injury/illness/exacerbation, and 3+ comorbidities: (Hx of MI, GERD, OA, chronic lymphocytic leukemia)  are also affecting patient's functional outcome.   REHAB POTENTIAL: Good  CLINICAL DECISION MAKING: Evolving/moderate complexity  EVALUATION COMPLEXITY: Moderate   GOALS: Goals reviewed with patient? Yes  SHORT TERM GOALS: Target date: 08/31/2023  Pt will be independent with HEP to improve strength  and decrease neck pain to improve pain-free function at home and work. Baseline: 08/10/23: Baseline HEP initiated.  Goal status: INITIAL   LONG TERM GOALS: Target date: 10/05/2023  Pt will demonstrate cervical spine flexion to 50 deg and bilat rotation to 60 deg or greater as needed for scanning environment and functional ROM Baseline: 08/10/23: Significant motion  loss (see chart above) Goal status: INITIAL  2.  Pt will decrease worst neck pain by at least 2 points on the NPRS in order to demonstrate clinically significant reduction in neck pain. Baseline: 08/10/23:  Goal status: INITIAL  3.  Pt will decrease NDI score by at least 19% in order demonstrate clinically significant reduction in neck pain/disability.       Baseline: 08/10/23: 34% Goal status: INITIAL  4.  Pt will improve BERG by 3 points or greater indicative of clinically meaningful improvement in pt's balance and fall risk Baseline: 08/10/23: BERG to be completed at visit # 2-3.    08/21/23: 49/56 Goal status: INITIAL   PLAN: PT FREQUENCY: 1-2x/week  PT DURATION: 6 weeks  PLANNED INTERVENTIONS: Therapeutic exercises, Therapeutic activity, Neuromuscular re-education, Balance training, Gait training, Patient/Family education, Self Care, Joint mobilization, Joint manipulation, Vestibular training, Canalith repositioning, Orthotic/Fit training, DME instructions, Dry Needling, Electrical stimulation, Spinal manipulation, Spinal mobilization, Cryotherapy, Moist heat, Taping, Traction, Ultrasound, Ionotophoresis 4mg /ml Dexamethasone, Manual therapy, and Re-evaluation.  PLAN FOR NEXT SESSION: Manual techniques for L>R upper trap, levator scapulae, rhomboid musculature. MET and mobilization techniques to improve C-spine AROM. Update HEP for upper quarter pain or strengthening/balance training prn.   Venetia Endo, PT, DPT #[id number]  Venetia ONEIDA Endo, PT 08/21/2023, 9:02 AM

## 2023-08-22 ENCOUNTER — Encounter: Payer: Self-pay | Admitting: Physical Therapy

## 2023-08-23 ENCOUNTER — Ambulatory Visit: Admitting: Physical Therapy

## 2023-08-23 DIAGNOSIS — M542 Cervicalgia: Secondary | ICD-10-CM | POA: Diagnosis not present

## 2023-08-23 DIAGNOSIS — R2689 Other abnormalities of gait and mobility: Secondary | ICD-10-CM

## 2023-08-23 DIAGNOSIS — M62838 Other muscle spasm: Secondary | ICD-10-CM

## 2023-08-23 DIAGNOSIS — R262 Difficulty in walking, not elsewhere classified: Secondary | ICD-10-CM

## 2023-08-23 NOTE — Therapy (Signed)
 OUTPATIENT PHYSICAL THERAPY TREATMENT   Patient Name: Cheryl Briggs MRN: [id number] DOB:October 27, 1945, 78 y.o., female Today's Date: 08/23/2023  END OF SESSION:  PT End of Session - 08/23/23 1416     Visit Number 4    Number of Visits 17    Date for PT Re-Evaluation 10/05/23    PT Start Time 1416    PT Stop Time 1458    PT Time Calculation (min) 42 min    Activity Tolerance Patient tolerated treatment well    Behavior During Therapy WFL for tasks assessed/performed           Past Medical History:  Diagnosis Date   Allergy    Arthritis    Cancer (HCC)    CLL   GERD (gastroesophageal reflux disease)    Myocardial infarction [facility])    Past Surgical History:  Procedure Laterality Date   BACK SURGERY     BLADDER REPAIR     CARDIAC CATHETERIZATION     carpel tunnell Bilateral    CHOLECYSTECTOMY     COLONOSCOPY  2015   COLONOSCOPY WITH PROPOFOL  N/A 10/05/2022   Procedure: COLONOSCOPY WITH PROPOFOL ;  Surgeon: Therisa Bi, MD;  Location: [facility] ENDOSCOPY;  Service: Gastroenterology;  Laterality: N/A;   CORONARY STENT INTERVENTION N/A 08/16/2019   Procedure: CORONARY STENT INTERVENTION;  Surgeon: Mady Bruckner, MD;  Location: [facility] INVASIVE CV LAB;  Service: Cardiovascular;  Laterality: N/A;   CORONARY STENT INTERVENTION Left 08/19/2019   Procedure: CORONARY STENT INTERVENTION;  Surgeon: Darron Deatrice LABOR, MD;  Location: [facility] INVASIVE CV LAB;  Service: Cardiovascular;  Laterality: Left;   CORONARY STENT INTERVENTION N/A 02/25/2020   Procedure: CORONARY STENT INTERVENTION;  Surgeon: Mady Bruckner, MD;  Location: [facility] INVASIVE CV LAB;  Service: Cardiovascular;  Laterality: N/A;   LEFT HEART CATH AND CORONARY ANGIOGRAPHY N/A 08/16/2019   Procedure: LEFT HEART CATH AND CORONARY ANGIOGRAPHY;  Surgeon: Mady Bruckner, MD;  Location: [facility] INVASIVE CV LAB;  Service: Cardiovascular;  Laterality: N/A;   LEFT HEART CATH AND CORONARY ANGIOGRAPHY N/A 02/25/2020   Procedure: LEFT HEART CATH  AND CORONARY ANGIOGRAPHY;  Surgeon: Mady Bruckner, MD;  Location: [facility] INVASIVE CV LAB;  Service: Cardiovascular;  Laterality: N/A;   RIGHT/LEFT HEART CATH AND CORONARY ANGIOGRAPHY N/A 06/18/2020   Procedure: RIGHT/LEFT HEART CATH AND CORONARY ANGIOGRAPHY;  Surgeon: Darron Deatrice LABOR, MD;  Location: [facility] INVASIVE CV LAB;  Service: Cardiovascular;  Laterality: N/A;   SHOULDER SURGERY Bilateral    UPPER EXTREMITY ANGIOGRAPHY Right 08/21/2019   Procedure: UPPER EXTREMITY ANGIOGRAPH;  Surgeon: Marea Selinda RAMAN, MD;  Location: [facility] INVASIVE CV LAB;  Service: Cardiovascular;  Laterality: Right;   Patient Active Problem List   Diagnosis Date Noted   Colon cancer screening 10/05/2022   Myocardial infarction (HCC) 08/19/2022   Degenerative joint disease involving multiple joints on both sides of body 05/11/2021   Acute non-ST segment elevation myocardial infarction (HCC) 11/30/2020   Prediabetes 11/30/2020   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   Tachycardia 11/29/2020   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)    Dyspnea    Hyperlipidemia    Gastroesophageal reflux disease without esophagitis    Chest pain 02/25/2020   Neutrophilic leukocytosis 02/25/2020   Essential hypertension    Atrial fibrillation with RVR (HCC) 08/16/2019   NSTEMI (non-ST elevated myocardial infarction) (HCC) 08/15/2019   Hematoma 08/15/2019   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 Atraumatic  onset. Pt has referral for neck pain/spasm and balance - no Hx of falls. Pt reports being unbalanced. Pt reports being told by referring provider he thought neck was result of muscle spasm. Hx of L RCR x 3. Patient reports not being as strong in L arm/shoulder. Pt reports difficulty with turning her head. Pt reports she has to hold her head still. L>R-sided neck pain. Pt reports pain/tingling along L suboccipital region with referral in ram's horn pattern around L ear. Pt reports trying to massage neck without relief.   Pain:  Pain Intensity: Present: 5/10, Best: 0/10, Worst: 10/10 Pain location: L SCM/L cervical paraspinal musculature  Pain Quality: aching  Radiating: Yes, around L suboccipital/peri-aural region Numbness/Tingling: Yes; into L suboccipital region, L hand can go numb with holding elbow in flexed position lon enough  Focal Weakness: No Aggravating factors: L rotation worse than R rotation, overhead activity  Relieving factors: Biofreeze, heat, refraining from notable movement 24-hour pain behavior: None  History of prior neck injury, pain, surgery, or therapy: Yes; Right RCR x 1, L RCR x 3; pt given home exercise sheet in past  Dominant hand: right Imaging: No   Red flags (personal history of cancer, h/o spinal tumors, history of compression fracture, chills/fever, night sweats, nausea, vomiting, unrelenting pain):  Hx of chronic lymphcytic leukemia  PRECAUTIONS: None  WEIGHT BEARING RESTRICTIONS: No  FALLS: Has patient fallen in last 6 months? 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   Patient Goals: Help my neck     OBJECTIVE (data from initial evaluation unless otherwise dated):    Patient Surveys  NDI = 17/50 = 34%  Posture L scapula sits higher than R at rest  AROM AROM (Normal range in degrees) AROM 08/10/23  Cervical  Flexion (50) 28*  Extension (80) 30  Right lateral flexion (45) 8  Left lateral flexion (45) 12  Right rotation (85) 20  Left rotation (85) 20  (* = pain; Blank rows = not tested)   Shoulder AROM: Flexion R 130 L 120; ABD 130 bilat; Functional ER: R T2, L C7; Functional IR: R T12, L T12   MMT MMT (out of 5) Right 08/10/23 Left 08/10/23      Shoulder   Flexion 4- 4-  Extension    Abduction 4- 4- (mild pain L arm)  Internal rotation 5 5  External rotation 4- 4-  Horizontal abduction    Horizontal adduction    Lower Trapezius    Rhomboids        Elbow  Flexion 5 5  Extension 4+ 4+  Pronation  Supination        Wrist  Flexion    Extension 5 5  Radial deviation    Ulnar deviation        (* = pain; Blank rows = not tested)  Reflexes R/L Elbow: 2+/1+  Brachioradialis: 2+/2+  Tricep: 2+/1+  Palpation Location LEFT  RIGHT           Suboccipitals    Cervical paraspinals 1 1  Upper Trapezius 2 2  Levator Scapulae 2 2  Rhomboid Major/Minor 2 2  (Blank rows = not tested) Graded on 0-4 scale (0 = no pain, 1 = pain, 2 = pain with wincing/grimacing/flinching, 3 = pain with withdrawal, 4 = unwilling to allow palpation), (Blank rows = not tested)  Repeated Movements Deferred   Passive Accessory Intervertebral Motion Decreased C3-6 R to L sideglide, Decreased C3-5 L to R sideglide.     SPECIAL TESTS Spurlings A (ipsilateral lateral flexion/axial compression): R: Localized neck pain, no upper limb pain/paresthesias,  L: Localized neck pain, no upper limb pain/paresthesias Distraction Test: Negative  Hoffman Sign  (cervical cord compression): R: Not examined L: Not examined ULTT Median: R: Not examined L: Not examined ULTT Ulnar: R: Not examined L: Not examined ULTT Radial: R: Not examined L: Not examined   OUTCOME MEASURES 5TSTS: 10.1 sec TUG: 9.2    BERG: 49/56 (08/21/23)   ____________________________    Clinical Test of Sensory Interaction for Balance    (CTSIB):  CONDITION TIME STRATEGY SWAY  Eyes open, firm surface 30 seconds ankle   Eyes closed, firm surface 30 seconds ankle   Eyes open, foam surface 30 seconds ankle   Eyes closed, foam surface 30 seconds ankle      FUNCTIONAL TASKS  Gait: Normal step cadence, shortened stride length, mild forward flexed posture  Sit to stand: Readily performed with no upper limb support on first attempted, no heavy trunk lean   Stair negotiation: Safe reciprocal stair negotiation, no UE support needed    LE Strength* R/L 4/4- Hip flexion 4+/4+ Hip abduction (seated) 5/5 Hip adduction (seated) 4+/4+ Knee extension 5/5 Knee flexion 4-/4 Ankle Dorsiflexion 4/4 Ankle Plantarflexion *indicates pain     TODAY'S TREATMENT    08/23/2023   SUBJECTIVE STATEMENT:   Patient reports working on HEP some since last visit. Patient reports pain is minimal when not moving her head/neck. Patient reports catching along L paracervical region with rotation in either direction. Patient reports tolerating HEP relatively well.    Manual Therapy - for symptom modulation, soft tissue sensitivity and mobility, joint mobility, ROM   STM/DTM L>R upper trap, levator scapulae, C3-6 splenius cervicis/capitis x 15 minutes  TPR bilat UT, LS; x 3 minute bouts for each  Gentle passive cervical spine rotation with contralateral anterior facet glide; x 5 ea dir   MET for cervical rotation with isometric contraction of antagonist muscle group; x 5 for R and L rotation  *not today* Suboccipital STM; x 5 minutes   AROM  C-spine rotation: R 55, R  57    Therapeutic Exercise - for improved soft tissue flexibility and extensibility as needed for ROM, periscapular isometrics and isotonics to improve postural endurance; strengthening as needed for power production to prevent fall during episode of LOB   Self-SNAG with towel for cervical spine rotation; x 10 ea dir, 1 sec hold at motion barrier  Standing shoulder flexion AAROM with wand; x 15  PATIENT EDUCATION: Reviewed HEP and discussed PT POC.    *not today*  Sit to stand; reviewed  Standing HR/TR; 1 x 10 - for HEP review     PATIENT EDUCATION:  Education details: see above for patient education details Person educated: Patient Education method: Explanation, Demonstration, and Handouts Education comprehension: verbalized understanding and returned demonstration   HOME EXERCISE PROGRAM:  Access Code: [email or web address] URL: [email or web address] Date: 08/16/2023 Prepared by: Venetia Endo  Exercises - Seated Upper Trapezius Stretch  - 2 x daily - 7 x weekly - 3 sets - 30sec hold - Seated Levator Scapulae Stretch  - 2 x daily - 7 x weekly - 3 sets - 30sec hold - Seated Scapular Retraction  - 2 x daily - 7 x weekly - 2 sets - 10 reps - 5sec hold - Seated Assisted Cervical Rotation with Towel  - 2 x daily - 7 x weekly - 2 sets - 10 reps - 1sec hold  Access Code: [email or web address] URL: [email or web address] Date: 08/21/2023 Prepared by: Venetia Endo  Exercises - Heel Toe Raises with Counter Support  - 2 x daily - 7 x weekly - 2-3 sets - 10-15 reps - Sit to Stand Without Arm Support  - 2 x daily - 7 x weekly - 2-3 sets - 10 reps - Standing Tandem Balance with Counter Support  - 2 x daily - 7 x weekly - 3 sets - 30sec hold   ASSESSMENT:  CLINICAL IMPRESSION: Pt is continuing with postural control and LE strengthening exercise at home and via exercises with [facility]. She has not experienced recent near-fall or safety incident of note. She is  able to attain cervical spine rotation almost to functional range of 60 deg bilat, (just 3-5 deg short) which is a notable improvement compared to eval. Pt tolerates manual therapy relatively well today, though L upper trapezius is still sensitive. Pt has current deficits in cervical spine and shoulder AROM, postural dysfunction, taut/tender L>R UT/LS, C-spine stiffness, and dec UE strength. Pt will continue to benefit from skilled PT services to address deficits and improve function.   OBJECTIVE IMPAIRMENTS: decreased ROM, decreased strength, hypomobility, increased muscle spasms, impaired flexibility, impaired UE functional use, postural dysfunction, and pain.   ACTIVITY LIMITATIONS: carrying, lifting, bending, and reach over head  PARTICIPATION LIMITATIONS: meal prep, cleaning, laundry, community activity, and yard work  PERSONAL FACTORS: Age, Past/current experiences, Time since onset of injury/illness/exacerbation, and 3+ comorbidities: (Hx of MI, GERD, OA, chronic lymphocytic leukemia)  are also affecting patient's functional outcome.   REHAB POTENTIAL: Good  CLINICAL DECISION MAKING: Evolving/moderate complexity  EVALUATION COMPLEXITY: Moderate   GOALS: Goals reviewed with patient? Yes  SHORT TERM GOALS: Target date: 08/31/2023  Pt will be independent with HEP to improve strength and decrease neck pain to improve pain-free function at home and work. Baseline: 08/10/23: Baseline HEP initiated.  Goal status: INITIAL   LONG TERM GOALS: Target date: 10/05/2023  Pt will demonstrate cervical spine flexion to 50 deg and bilat rotation to 60 deg or greater as needed for scanning environment and functional ROM Baseline: 08/10/23: Significant motion loss (see chart above) Goal status: INITIAL  2.  Pt will decrease worst neck pain by at least 2 points on the NPRS in order to demonstrate clinically significant reduction in neck pain. Baseline: 08/10/23:  Goal status: INITIAL  3.  Pt will  decrease NDI score by at least 19% in order demonstrate clinically significant reduction in neck pain/disability.       Baseline: 08/10/23: 34% Goal status: INITIAL  4.  Pt will improve BERG  by 3 points or greater indicative of clinically meaningful improvement in pt's balance and fall risk Baseline: 08/10/23: BERG to be completed at visit # 2-3.    08/21/23: 49/56 Goal status: INITIAL   PLAN: PT FREQUENCY: 1-2x/week  PT DURATION: 6 weeks  PLANNED INTERVENTIONS: Therapeutic exercises, Therapeutic activity, Neuromuscular re-education, Balance training, Gait training, Patient/Family education, Self Care, Joint mobilization, Joint manipulation, Vestibular training, Canalith repositioning, Orthotic/Fit training, DME instructions, Dry Needling, Electrical stimulation, Spinal manipulation, Spinal mobilization, Cryotherapy, Moist heat, Taping, Traction, Ultrasound, Ionotophoresis 4mg /ml Dexamethasone, Manual therapy, and Re-evaluation.  PLAN FOR NEXT SESSION: Manual techniques for L>R upper trap, levator scapulae, rhomboid musculature. MET and mobilization techniques to improve C-spine AROM. Update HEP for upper quarter pain or strengthening/balance training prn.   Venetia Endo, PT, DPT #[id number]  Venetia ONEIDA Endo, PT 08/23/2023, 2:51 PM

## 2023-08-24 ENCOUNTER — Encounter: Payer: Self-pay | Admitting: Physical Therapy

## 2023-08-28 ENCOUNTER — Ambulatory Visit: Admitting: Physical Therapy

## 2023-08-28 ENCOUNTER — Encounter: Payer: Self-pay | Admitting: Physical Therapy

## 2023-08-28 DIAGNOSIS — M542 Cervicalgia: Secondary | ICD-10-CM | POA: Diagnosis not present

## 2023-08-28 DIAGNOSIS — R262 Difficulty in walking, not elsewhere classified: Secondary | ICD-10-CM

## 2023-08-28 DIAGNOSIS — M62838 Other muscle spasm: Secondary | ICD-10-CM

## 2023-08-28 DIAGNOSIS — R2689 Other abnormalities of gait and mobility: Secondary | ICD-10-CM

## 2023-08-28 NOTE — Therapy (Signed)
 OUTPATIENT PHYSICAL THERAPY TREATMENT   Patient Name: Cheryl Briggs MRN: [id number] DOB:12-25-1945, 78 y.o., female Today's Date: 08/28/2023  END OF SESSION:  PT End of Session - 08/28/23 0903     Visit Number 5    Number of Visits 17    Date for PT Re-Evaluation 10/05/23    PT Start Time 0901    PT Stop Time 0943    PT Time Calculation (min) 42 min    Activity Tolerance Patient tolerated treatment well    Behavior During Therapy [facility] for tasks assessed/performed            Past Medical History:  Diagnosis Date   Allergy    Arthritis    Cancer (HCC)    CLL   GERD (gastroesophageal reflux disease)    Myocardial infarction [facility])    Past Surgical History:  Procedure Laterality Date   BACK SURGERY     BLADDER REPAIR     CARDIAC CATHETERIZATION     carpel tunnell Bilateral    CHOLECYSTECTOMY     COLONOSCOPY  2015   COLONOSCOPY WITH PROPOFOL  N/A 10/05/2022   Procedure: COLONOSCOPY WITH PROPOFOL ;  Surgeon: Therisa Bi, MD;  Location: [facility] ENDOSCOPY;  Service: Gastroenterology;  Laterality: N/A;   CORONARY STENT INTERVENTION N/A 08/16/2019   Procedure: CORONARY STENT INTERVENTION;  Surgeon: Mady Bruckner, MD;  Location: [facility] INVASIVE CV LAB;  Service: Cardiovascular;  Laterality: N/A;   CORONARY STENT INTERVENTION Left 08/19/2019   Procedure: CORONARY STENT INTERVENTION;  Surgeon: Darron Deatrice LABOR, MD;  Location: [facility] INVASIVE CV LAB;  Service: Cardiovascular;  Laterality: Left;   CORONARY STENT INTERVENTION N/A 02/25/2020   Procedure: CORONARY STENT INTERVENTION;  Surgeon: Mady Bruckner, MD;  Location: [facility] INVASIVE CV LAB;  Service: Cardiovascular;  Laterality: N/A;   LEFT HEART CATH AND CORONARY ANGIOGRAPHY N/A 08/16/2019   Procedure: LEFT HEART CATH AND CORONARY ANGIOGRAPHY;  Surgeon: Mady Bruckner, MD;  Location: [facility] INVASIVE CV LAB;  Service: Cardiovascular;  Laterality: N/A;   LEFT HEART CATH AND CORONARY ANGIOGRAPHY N/A 02/25/2020   Procedure: LEFT HEART CATH  AND CORONARY ANGIOGRAPHY;  Surgeon: Mady Bruckner, MD;  Location: [facility] INVASIVE CV LAB;  Service: Cardiovascular;  Laterality: N/A;   RIGHT/LEFT HEART CATH AND CORONARY ANGIOGRAPHY N/A 06/18/2020   Procedure: RIGHT/LEFT HEART CATH AND CORONARY ANGIOGRAPHY;  Surgeon: Darron Deatrice LABOR, MD;  Location: [facility] INVASIVE CV LAB;  Service: Cardiovascular;  Laterality: N/A;   SHOULDER SURGERY Bilateral    UPPER EXTREMITY ANGIOGRAPHY Right 08/21/2019   Procedure: UPPER EXTREMITY ANGIOGRAPH;  Surgeon: Marea Selinda RAMAN, MD;  Location: [facility] INVASIVE CV LAB;  Service: Cardiovascular;  Laterality: Right;   Patient Active Problem List   Diagnosis Date Noted   Colon cancer screening 10/05/2022   Myocardial infarction (HCC) 08/19/2022   Degenerative joint disease involving multiple joints on both sides of body 05/11/2021   Acute non-ST segment elevation myocardial infarction (HCC) 11/30/2020   Prediabetes 11/30/2020   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   Tachycardia 11/29/2020   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)    Dyspnea    Hyperlipidemia    Gastroesophageal reflux disease without esophagitis    Chest pain 02/25/2020   Neutrophilic leukocytosis 02/25/2020   Essential hypertension    Atrial fibrillation with RVR (HCC) 08/16/2019   NSTEMI (non-ST elevated myocardial infarction) (HCC) 08/15/2019   Hematoma 08/15/2019   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 Atraumatic  onset. Pt has referral for neck pain/spasm and balance - no Hx of falls. Pt reports being unbalanced. Pt reports being told by referring provider he thought neck was result of muscle spasm. Hx of L RCR x 3. Patient reports not being as strong in L arm/shoulder. Pt reports difficulty with turning her head. Pt reports she has to hold her head still. L>R-sided neck pain. Pt reports pain/tingling along L suboccipital region with referral in ram's horn pattern around L ear. Pt reports trying to massage neck without relief.   Pain:  Pain Intensity: Present: 5/10, Best: 0/10, Worst: 10/10 Pain location: L SCM/L cervical paraspinal musculature  Pain Quality: aching  Radiating: Yes, around L suboccipital/peri-aural region Numbness/Tingling: Yes; into L suboccipital region, L hand can go numb with holding elbow in flexed position lon enough  Focal Weakness: No Aggravating factors: L rotation worse than R rotation, overhead activity  Relieving factors: Biofreeze, heat, refraining from notable movement 24-hour pain behavior: None  History of prior neck injury, pain, surgery, or therapy: Yes; Right RCR x 1, L RCR x 3; pt given home exercise sheet in past  Dominant hand: right Imaging: No   Red flags (personal history of cancer, h/o spinal tumors, history of compression fracture, chills/fever, night sweats, nausea, vomiting, unrelenting pain):  Hx of chronic lymphcytic leukemia  PRECAUTIONS: None  WEIGHT BEARING RESTRICTIONS: No  FALLS: Has patient fallen in last 6 months? 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   Patient Goals: Help my neck     OBJECTIVE (data from initial evaluation unless otherwise dated):    Patient Surveys  NDI = 17/50 = 34%  Posture L scapula sits higher than R at rest  AROM AROM (Normal range in degrees) AROM 08/10/23  Cervical  Flexion (50) 28*  Extension (80) 30  Right lateral flexion (45) 8  Left lateral flexion (45) 12  Right rotation (85) 20  Left rotation (85) 20  (* = pain; Blank rows = not tested)   Shoulder AROM: Flexion R 130 L 120; ABD 130 bilat; Functional ER: R T2, L C7; Functional IR: R T12, L T12   MMT MMT (out of 5) Right 08/10/23 Left 08/10/23      Shoulder   Flexion 4- 4-  Extension    Abduction 4- 4- (mild pain L arm)  Internal rotation 5 5  External rotation 4- 4-  Horizontal abduction    Horizontal adduction    Lower Trapezius    Rhomboids        Elbow  Flexion 5 5  Extension 4+ 4+  Pronation  Supination        Wrist  Flexion    Extension 5 5  Radial deviation    Ulnar deviation        (* = pain; Blank rows = not tested)  Reflexes R/L Elbow: 2+/1+  Brachioradialis: 2+/2+  Tricep: 2+/1+  Palpation Location LEFT  RIGHT           Suboccipitals    Cervical paraspinals 1 1  Upper Trapezius 2 2  Levator Scapulae 2 2  Rhomboid Major/Minor 2 2  (Blank rows = not tested) Graded on 0-4 scale (0 = no pain, 1 = pain, 2 = pain with wincing/grimacing/flinching, 3 = pain with withdrawal, 4 = unwilling to allow palpation), (Blank rows = not tested)  Repeated Movements Deferred   Passive Accessory Intervertebral Motion Decreased C3-6 R to L sideglide, Decreased C3-5 L to R sideglide.     SPECIAL TESTS Spurlings A (ipsilateral lateral flexion/axial compression): R: Localized neck pain, no upper limb pain/paresthesias,  L: Localized neck pain, no upper limb pain/paresthesias Distraction Test: Negative  Hoffman Sign  (cervical cord compression): R: Not examined L: Not examined ULTT Median: R: Not examined L: Not examined ULTT Ulnar: R: Not examined L: Not examined ULTT Radial: R: Not examined L: Not examined   OUTCOME MEASURES 5TSTS: 10.1 sec TUG: 9.2    BERG: 49/56 (08/21/23)   ____________________________    Clinical Test of Sensory Interaction for Balance    (CTSIB):  CONDITION TIME STRATEGY SWAY  Eyes open, firm surface 30 seconds ankle   Eyes closed, firm surface 30 seconds ankle   Eyes open, foam surface 30 seconds ankle   Eyes closed, foam surface 30 seconds ankle      FUNCTIONAL TASKS  Gait: Normal step cadence, shortened stride length, mild forward flexed posture  Sit to stand: Readily performed with no upper limb support on first attempted, no heavy trunk lean   Stair negotiation: Safe reciprocal stair negotiation, no UE support needed    LE Strength* R/L 4/4- Hip flexion 4+/4+ Hip abduction (seated) 5/5 Hip adduction (seated) 4+/4+ Knee extension 5/5 Knee flexion 4-/4 Ankle Dorsiflexion 4/4 Ankle Plantarflexion *indicates pain     TODAY'S TREATMENT    08/28/2023   SUBJECTIVE STATEMENT:   Patient reports 5/10 pain with turning her head; no pain at rest. Patient reports her condition is getting better.    Manual Therapy - for symptom modulation, soft tissue sensitivity and mobility, joint mobility, ROM   STM/DTM L>R upper trap, levator scapulae, C3-6 splenius cervicis/capitis x 15 minutes  TPR bilat UT, LS; x 3 minute bouts for each  Gentle passive cervical spine rotation with contralateral anterior facet glide; x 5 ea dir   MET for cervical rotation with isometric contraction of antagonist muscle group; x 5 for R and L rotation  *not today* Suboccipital STM; x 5 minutes   AROM  C-spine rotation: R 48, L 65    Therapeutic Exercise - for improved soft tissue flexibility and extensibility as needed for ROM, periscapular isometrics and isotonics to improve  postural endurance; strengthening as needed for power production to prevent fall during episode of LOB   Self-SNAG with towel for cervical spine rotation; x 10 ea dir, 1 sec hold at motion barrier   -for review  Standing shoulder flexion AAROM with wand; 2x10, back to wall for postural cue  Standing high row with Red Tband; x15, for eccentric phase AAROM for forward elevation  PATIENT EDUCATION: Reviewed HEP and discussed PT  POC.    *not today* Sit to stand; reviewed  Standing HR/TR; 1 x 10 - for HEP review     PATIENT EDUCATION:  Education details: see above for patient education details Person educated: Patient Education method: Explanation, Demonstration, and Handouts Education comprehension: verbalized understanding and returned demonstration   HOME EXERCISE PROGRAM:  Access Code: [email or web address] URL: [email or web address] Date: 08/16/2023 Prepared by: Venetia Endo  Exercises - Seated Upper Trapezius Stretch  - 2 x daily - 7 x weekly - 3 sets - 30sec hold - Seated Levator Scapulae Stretch  - 2 x daily - 7 x weekly - 3 sets - 30sec hold - Seated Scapular Retraction  - 2 x daily - 7 x weekly - 2 sets - 10 reps - 5sec hold - Seated Assisted Cervical Rotation with Towel  - 2 x daily - 7 x weekly - 2 sets - 10 reps - 1sec hold  Access Code: [email or web address] URL: [email or web address] Date: 08/21/2023 Prepared by: Venetia Endo  Exercises - Heel Toe Raises with Counter Support  - 2 x daily - 7 x weekly - 2-3 sets - 10-15 reps - Sit to Stand Without Arm Support  - 2 x daily - 7 x weekly - 2-3 sets - 10 reps - Standing Tandem Balance with Counter Support  - 2 x daily - 7 x weekly - 3 sets - 30sec hold   ASSESSMENT:  CLINICAL IMPRESSION: Pt exhibits markedly improving rotation to either side and feels that accessing rotation is easier subjectively for her. Pt is doing well with HEP and has improved tolerance of direct pressure onto UT/LS region. Pt has  current deficits in cervical spine and shoulder AROM, postural dysfunction, taut/tender L>R UT/LS, C-spine stiffness, and dec UE strength. Pt will continue to benefit from skilled PT services to address deficits and improve function.   OBJECTIVE IMPAIRMENTS: decreased ROM, decreased strength, hypomobility, increased muscle spasms, impaired flexibility, impaired UE functional use, postural dysfunction, and pain.   ACTIVITY LIMITATIONS: carrying, lifting, bending, and reach over head  PARTICIPATION LIMITATIONS: meal prep, cleaning, laundry, community activity, and yard work  PERSONAL FACTORS: Age, Past/current experiences, Time since onset of injury/illness/exacerbation, and 3+ comorbidities: (Hx of MI, GERD, OA, chronic lymphocytic leukemia)  are also affecting patient's functional outcome.   REHAB POTENTIAL: Good  CLINICAL DECISION MAKING: Evolving/moderate complexity  EVALUATION COMPLEXITY: Moderate   GOALS: Goals reviewed with patient? Yes  SHORT TERM GOALS: Target date: 08/31/2023  Pt will be independent with HEP to improve strength and decrease neck pain to improve pain-free function at home and work. Baseline: 08/10/23: Baseline HEP initiated.  Goal status: INITIAL   LONG TERM GOALS: Target date: 10/05/2023  Pt will demonstrate cervical spine flexion to 50 deg and bilat rotation to 60 deg or greater as needed for scanning environment and functional ROM Baseline: 08/10/23: Significant motion loss (see chart above) Goal status: INITIAL  2.  Pt will decrease worst neck pain by at least 2 points on the NPRS in order to demonstrate clinically significant reduction in neck pain. Baseline: 08/10/23:  Goal status: INITIAL  3.  Pt will decrease NDI score by at least 19% in order demonstrate clinically significant reduction in neck pain/disability.       Baseline: 08/10/23: 34% Goal status: INITIAL  4.  Pt will improve BERG by 3 points or greater indicative of clinically meaningful  improvement in pt's balance and fall risk Baseline: 08/10/23: BERG to be completed at visit # 2-3.    08/21/23: 49/56 Goal  status: INITIAL   PLAN: PT FREQUENCY: 1-2x/week  PT DURATION: 6 weeks  PLANNED INTERVENTIONS: Therapeutic exercises, Therapeutic activity, Neuromuscular re-education, Balance training, Gait training, Patient/Family education, Self Care, Joint mobilization, Joint manipulation, Vestibular training, Canalith repositioning, Orthotic/Fit training, DME instructions, Dry Needling, Electrical stimulation, Spinal manipulation, Spinal mobilization, Cryotherapy, Moist heat, Taping, Traction, Ultrasound, Ionotophoresis 4mg /ml Dexamethasone, Manual therapy, and Re-evaluation.  PLAN FOR NEXT SESSION: Manual techniques for L>R upper trap, levator scapulae, rhomboid musculature. MET and mobilization techniques to improve C-spine AROM. Update HEP for upper quarter pain or strengthening/balance training prn.   Venetia Endo, PT, DPT #[id number]  Venetia ONEIDA Endo, PT 08/28/2023, 9:03 AM

## 2023-08-30 ENCOUNTER — Encounter: Admitting: Physical Therapy

## 2023-08-31 ENCOUNTER — Encounter: Payer: Self-pay | Admitting: Physical Therapy

## 2023-08-31 ENCOUNTER — Ambulatory Visit: Admitting: Physical Therapy

## 2023-08-31 DIAGNOSIS — R2689 Other abnormalities of gait and mobility: Secondary | ICD-10-CM

## 2023-08-31 DIAGNOSIS — M542 Cervicalgia: Secondary | ICD-10-CM | POA: Diagnosis not present

## 2023-08-31 DIAGNOSIS — M62838 Other muscle spasm: Secondary | ICD-10-CM

## 2023-08-31 DIAGNOSIS — R262 Difficulty in walking, not elsewhere classified: Secondary | ICD-10-CM

## 2023-08-31 NOTE — Therapy (Unsigned)
 OUTPATIENT PHYSICAL THERAPY TREATMENT   Patient Name: Cheryl Briggs MRN: [id number] DOB:18-Nov-1945, 78 y.o., female Today's Date: 08/31/2023  END OF SESSION:  PT End of Session - 08/31/23 1348     Visit Number 6    Number of Visits 17    Date for PT Re-Evaluation 10/05/23    PT Start Time 1349    PT Stop Time 1430    PT Time Calculation (min) 41 min    Activity Tolerance Patient tolerated treatment well    Behavior During Therapy WFL for tasks assessed/performed          Past Medical History:  Diagnosis Date   Allergy    Arthritis    Cancer (HCC)    CLL   GERD (gastroesophageal reflux disease)    Myocardial infarction [facility])    Past Surgical History:  Procedure Laterality Date   BACK SURGERY     BLADDER REPAIR     CARDIAC CATHETERIZATION     carpel tunnell Bilateral    CHOLECYSTECTOMY     COLONOSCOPY  2015   COLONOSCOPY WITH PROPOFOL  N/A 10/05/2022   Procedure: COLONOSCOPY WITH PROPOFOL ;  Surgeon: Therisa Bi, MD;  Location: [facility] ENDOSCOPY;  Service: Gastroenterology;  Laterality: N/A;   CORONARY STENT INTERVENTION N/A 08/16/2019   Procedure: CORONARY STENT INTERVENTION;  Surgeon: Mady Bruckner, MD;  Location: [facility] INVASIVE CV LAB;  Service: Cardiovascular;  Laterality: N/A;   CORONARY STENT INTERVENTION Left 08/19/2019   Procedure: CORONARY STENT INTERVENTION;  Surgeon: Darron Deatrice LABOR, MD;  Location: [facility] INVASIVE CV LAB;  Service: Cardiovascular;  Laterality: Left;   CORONARY STENT INTERVENTION N/A 02/25/2020   Procedure: CORONARY STENT INTERVENTION;  Surgeon: Mady Bruckner, MD;  Location: [facility] INVASIVE CV LAB;  Service: Cardiovascular;  Laterality: N/A;   LEFT HEART CATH AND CORONARY ANGIOGRAPHY N/A 08/16/2019   Procedure: LEFT HEART CATH AND CORONARY ANGIOGRAPHY;  Surgeon: Mady Bruckner, MD;  Location: [facility] INVASIVE CV LAB;  Service: Cardiovascular;  Laterality: N/A;   LEFT HEART CATH AND CORONARY ANGIOGRAPHY N/A 02/25/2020   Procedure: LEFT HEART CATH AND  CORONARY ANGIOGRAPHY;  Surgeon: Mady Bruckner, MD;  Location: [facility] INVASIVE CV LAB;  Service: Cardiovascular;  Laterality: N/A;   RIGHT/LEFT HEART CATH AND CORONARY ANGIOGRAPHY N/A 06/18/2020   Procedure: RIGHT/LEFT HEART CATH AND CORONARY ANGIOGRAPHY;  Surgeon: Darron Deatrice LABOR, MD;  Location: [facility] INVASIVE CV LAB;  Service: Cardiovascular;  Laterality: N/A;   SHOULDER SURGERY Bilateral    UPPER EXTREMITY ANGIOGRAPHY Right 08/21/2019   Procedure: UPPER EXTREMITY ANGIOGRAPH;  Surgeon: Marea Selinda RAMAN, MD;  Location: [facility] INVASIVE CV LAB;  Service: Cardiovascular;  Laterality: Right;   Patient Active Problem List   Diagnosis Date Noted   Colon cancer screening 10/05/2022   Myocardial infarction (HCC) 08/19/2022   Degenerative joint disease involving multiple joints on both sides of body 05/11/2021   Acute non-ST segment elevation myocardial infarction 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 Atraumatic  onset. Pt has referral for neck pain/spasm and balance - no Hx of falls. Pt reports being unbalanced. Pt reports being told by referring provider he thought neck was result of muscle spasm. Hx of L RCR x 3. Patient reports not being as strong in L arm/shoulder. Pt reports difficulty with turning her head. Pt reports she has to hold her head still. L>R-sided neck pain. Pt reports pain/tingling along L suboccipital region with referral in ram's horn pattern around L ear. Pt reports trying to massage neck without relief.   Pain:  Pain Intensity: Present: 5/10, Best: 0/10, Worst: 10/10 Pain location: L SCM/L cervical paraspinal musculature  Pain Quality: aching  Radiating: Yes, around L suboccipital/peri-aural region Numbness/Tingling: Yes; into L suboccipital region, L hand can go numb with holding elbow in flexed position lon enough  Focal Weakness: No Aggravating factors: L rotation worse than R rotation, overhead activity  Relieving factors: Biofreeze, heat, refraining from notable movement 24-hour pain behavior: None  History of prior neck injury, pain, surgery, or therapy: Yes; Right RCR x 1, L RCR x 3; pt given home exercise sheet in past  Dominant hand: right Imaging: No   Red flags (personal history of cancer, h/o spinal tumors, history of compression fracture, chills/fever, night sweats, nausea, vomiting, unrelenting pain):  Hx of chronic lymphcytic leukemia  PRECAUTIONS: None  WEIGHT BEARING RESTRICTIONS: No  FALLS: Has patient fallen in last 6 months? 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   Patient Goals: Help my neck     OBJECTIVE (data from initial evaluation unless otherwise dated):    Patient Surveys  NDI = 17/50 = 34%  Posture L scapula sits higher than R at rest  AROM AROM (Normal range in degrees) AROM 08/10/23  Cervical  Flexion (50) 28*  Extension (80) 30  Right lateral flexion (45) 8  Left lateral flexion (45) 12  Right rotation (85) 20  Left rotation (85) 20  (* = pain; Blank rows = not tested)   Shoulder AROM: Flexion R 130 L 120; ABD 130 bilat; Functional ER: R T2, L C7; Functional IR: R T12, L T12   MMT MMT (out of 5) Right 08/10/23 Left 08/10/23      Shoulder   Flexion 4- 4-  Extension    Abduction 4- 4- (mild pain L arm)  Internal rotation 5 5  External rotation 4- 4-  Horizontal abduction    Horizontal adduction    Lower Trapezius    Rhomboids        Elbow  Flexion 5 5  Extension 4+ 4+  Pronation    Supination  Wrist  Flexion    Extension 5 5  Radial deviation    Ulnar deviation        (* = pain; Blank rows = not tested)  Reflexes R/L Elbow: 2+/1+  Brachioradialis: 2+/2+  Tricep: 2+/1+  Palpation Location LEFT  RIGHT           Suboccipitals    Cervical paraspinals 1 1  Upper Trapezius 2 2  Levator Scapulae 2 2  Rhomboid Major/Minor 2 2  (Blank rows = not tested) Graded on 0-4 scale (0 = no pain, 1 = pain, 2 = pain with wincing/grimacing/flinching, 3 = pain with withdrawal, 4 = unwilling to allow palpation), (Blank rows = not tested)  Repeated Movements Deferred   Passive Accessory Intervertebral Motion Decreased C3-6 R to L sideglide, Decreased C3-5 L to R sideglide.     SPECIAL TESTS Spurlings A (ipsilateral lateral flexion/axial compression): R: Localized neck pain, no upper limb pain/paresthesias,  L: Localized neck pain, no upper limb pain/paresthesias Distraction Test: Negative  Hoffman Sign  (cervical cord compression): R: Not examined L: Not examined ULTT Median: R: Not examined L: Not examined ULTT Ulnar: R: Not examined L: Not examined ULTT Radial: R: Not examined L: Not examined   OUTCOME MEASURES 5TSTS: 10.1 sec TUG: 9.2    BERG: 49/56 (08/21/23)   ____________________________    Clinical Test of Sensory Interaction for Balance    (CTSIB):  CONDITION TIME STRATEGY SWAY  Eyes open, firm surface 30 seconds ankle   Eyes closed, firm surface 30 seconds ankle   Eyes open, foam surface 30 seconds ankle   Eyes closed, foam surface 30 seconds ankle      FUNCTIONAL TASKS  Gait: Normal step cadence, shortened stride length, mild forward flexed posture  Sit to stand: Readily performed with no upper limb support on first attempted, no heavy trunk lean   Stair negotiation: Safe reciprocal stair negotiation, no UE support needed    LE Strength* R/L 4/4- Hip flexion 4+/4+ Hip abduction (seated) 5/5 Hip adduction (seated) 4+/4+ Knee extension 5/5 Knee flexion 4-/4 Ankle Dorsiflexion 4/4 Ankle Plantarflexion *indicates pain     TODAY'S TREATMENT    08/31/2023   SUBJECTIVE STATEMENT:   Patient reports pain affecting L scalene/SCM region at arrival. Pt reports continuous pain affecting upper periscapular region that is almost normal to her at this point. Patient reports her pain is somewhat better.    Manual Therapy - for symptom modulation, soft tissue sensitivity and mobility, joint mobility, ROM   STM/DTM L>R upper trap, levator scapulae, C3-6 splenius cervicis/capitis, L SCM x 15 minutes   -emphasis on L SCM today TPR bilat UT; x 3 minute bouts for each  Gentle passive cervical spine rotation with contralateral anterior facet glide; x 5 ea dir   Passive sidebend stretch to R and L with scapular depression; x 30 sec ea dir  MET for cervical rotation with isometric contraction of antagonist muscle group; x 5 for R and L rotation  *not today* Suboccipital  STM; x 5 minutes   AROM  C-spine rotation: R 50, L 58    Therapeutic Exercise - for improved soft tissue flexibility and extensibility as needed for ROM, periscapular isometrics and isotonics to improve postural endurance; upper limb AROM/AAROM as needed for reaching   Standing shoulder flexion AAROM with wand; 1x10, back to wall for postural cue Supine shoulder flexion AAROM with wand; 1x10  -cueing for ensuring minimal pain with ROM with some axillary discomfort at full end-range  Standing high row with Red Tband; x15, for eccentric phase AAROM for forward elevation  PATIENT EDUCATION: Reviewed HEP and discussed PT POC.    *not today* Self-SNAG with towel for cervical spine rotation; x 10 ea dir, 1 sec hold at motion barrier   -for review Sit to stand; reviewed  Standing HR/TR; 1 x 10 - for HEP review     PATIENT EDUCATION:  Education details: see above for patient education details Person educated: Patient Education method: Explanation, Demonstration, and Handouts Education comprehension: verbalized understanding and returned demonstration   HOME EXERCISE PROGRAM:  Access Code: [email or web address] URL: [email or web address] Date: 08/16/2023 Prepared by: Venetia Endo  Exercises - Seated Upper Trapezius Stretch  - 2 x daily - 7 x weekly - 3 sets - 30sec hold - Seated Levator Scapulae Stretch  - 2 x daily - 7 x weekly - 3 sets - 30sec hold - Seated Scapular Retraction  - 2 x daily - 7 x weekly - 2 sets - 10 reps - 5sec hold - Seated Assisted Cervical Rotation with Towel  - 2 x daily - 7 x weekly - 2 sets - 10 reps - 1sec hold  Access Code: [email or web address] URL: [email or web address] Date: 08/21/2023 Prepared by: Venetia Endo  Exercises - Heel Toe Raises with Counter Support  - 2 x daily - 7 x weekly - 2-3 sets - 10-15 reps - Sit to Stand Without Arm Support  - 2 x daily - 7 x weekly - 2-3 sets - 10 reps - Standing Tandem Balance with Counter  Support  - 2 x daily - 7 x weekly - 3 sets - 30sec hold   ASSESSMENT:  CLINICAL IMPRESSION: Pt has notably improved with ROM overall relative to initial evaluation and she feels that her condition has modestly improved. She has notably taut/tender L SCM and has fair response to STM. Pt has notably limited rotation to R that is improved with MET. Pt has current deficits in cervical spine and shoulder AROM, postural dysfunction, taut/tender L>R UT/LS, C-spine stiffness, and dec UE strength. Pt will continue to benefit from skilled PT services to address deficits and improve function.   OBJECTIVE IMPAIRMENTS: decreased ROM, decreased strength, hypomobility, increased muscle spasms, impaired flexibility, impaired UE functional use, postural dysfunction, and pain.   ACTIVITY LIMITATIONS: carrying, lifting, bending, and reach over head  PARTICIPATION LIMITATIONS: meal prep, cleaning, laundry, community activity, and yard work  PERSONAL FACTORS: Age, Past/current experiences, Time since onset of injury/illness/exacerbation, and 3+ comorbidities: (Hx of MI, GERD, OA, chronic lymphocytic leukemia)  are also affecting patient's functional outcome.   REHAB POTENTIAL: Good  CLINICAL DECISION MAKING: Evolving/moderate complexity  EVALUATION COMPLEXITY: Moderate   GOALS: Goals reviewed with patient? Yes  SHORT TERM GOALS: Target date: 08/31/2023  Pt will be independent with HEP to improve strength and decrease neck pain to improve pain-free function at home and work. Baseline: 08/10/23: Baseline HEP initiated.  Goal status: INITIAL   LONG TERM GOALS: Target date: 10/05/2023  Pt will demonstrate cervical spine flexion to 50 deg and bilat rotation to 60 deg or greater as needed for scanning environment and functional ROM Baseline: 08/10/23: Significant motion loss (see chart above) Goal status: INITIAL  2.  Pt will decrease worst neck pain by at least 2 points on the NPRS in order to demonstrate  clinically significant reduction in neck pain. Baseline: 08/10/23:  Goal status: INITIAL  3.  Pt will decrease NDI score by at least 19% in order demonstrate clinically significant reduction in  neck pain/disability.       Baseline: 08/10/23: 34% Goal status: INITIAL  4.  Pt will improve BERG by 3 points or greater indicative of clinically meaningful improvement in pt's balance and fall risk Baseline: 08/10/23: BERG to be completed at visit # 2-3.    08/21/23: 49/56 Goal status: INITIAL   PLAN: PT FREQUENCY: 1-2x/week  PT DURATION: 6 weeks  PLANNED INTERVENTIONS: Therapeutic exercises, Therapeutic activity, Neuromuscular re-education, Balance training, Gait training, Patient/Family education, Self Care, Joint mobilization, Joint manipulation, Vestibular training, Canalith repositioning, Orthotic/Fit training, DME instructions, Dry Needling, Electrical stimulation, Spinal manipulation, Spinal mobilization, Cryotherapy, Moist heat, Taping, Traction, Ultrasound, Ionotophoresis 4mg /ml Dexamethasone, Manual therapy, and Re-evaluation.  PLAN FOR NEXT SESSION: Manual techniques for L>R upper trap, levator scapulae, rhomboid musculature. MET and mobilization techniques to improve C-spine AROM. Update HEP for upper quarter pain or strengthening/balance training prn.   Venetia Endo, PT, DPT #[id number]  Venetia ONEIDA Endo, PT 08/31/2023, 1:49 PM

## 2023-09-04 ENCOUNTER — Ambulatory Visit: Admitting: Physical Therapy

## 2023-09-04 ENCOUNTER — Encounter: Payer: Self-pay | Admitting: Physical Therapy

## 2023-09-04 DIAGNOSIS — R2689 Other abnormalities of gait and mobility: Secondary | ICD-10-CM

## 2023-09-04 DIAGNOSIS — M62838 Other muscle spasm: Secondary | ICD-10-CM

## 2023-09-04 DIAGNOSIS — R262 Difficulty in walking, not elsewhere classified: Secondary | ICD-10-CM

## 2023-09-04 DIAGNOSIS — M542 Cervicalgia: Secondary | ICD-10-CM

## 2023-09-04 NOTE — Therapy (Signed)
 OUTPATIENT PHYSICAL THERAPY TREATMENT   Patient Name: Cheryl Briggs MRN: [id number] DOB:[birth year], 78 y.o., female Today's Date: 09/04/2023  END OF SESSION:  PT End of Session - 09/04/23 1425     Visit Number 7    Number of Visits 17    Date for PT Re-Evaluation 10/05/23    PT Start Time 1424    PT Stop Time 1506    PT Time Calculation (min) 42 min    Activity Tolerance Patient tolerated treatment well    Behavior During Therapy WFL for tasks assessed/performed           Past Medical History:  Diagnosis Date   Allergy    Arthritis    Cancer (HCC)    CLL   GERD (gastroesophageal reflux disease)    Myocardial infarction [facility])    Past Surgical History:  Procedure Laterality Date   BACK SURGERY     BLADDER REPAIR     CARDIAC CATHETERIZATION     carpel tunnell Bilateral    CHOLECYSTECTOMY     COLONOSCOPY  2015   COLONOSCOPY WITH PROPOFOL  N/A 10/05/2022   Procedure: COLONOSCOPY WITH PROPOFOL ;  Surgeon: Therisa Bi, MD;  Location[facility] ENDOSCOPY;  Service: Gastroenterology;  Laterality: N/A;   CORONARY STENT INTERVENTION N/A 08/16/2019   Procedure: CORONARY STENT INTERVENTION;  Surgeon: Mady Bruckner, MD;  Location: [facility] INVASIVE CV LAB;  Service: Cardiovascular;  Laterality: N/A;   CORONARY STENT INTERVENTION Left 08/19/2019   Procedure: CORONARY STENT INTERVENTION;  Surgeon: Darron Deatrice LABOR, MD;  Location: [facility] INVASIVE CV LAB;  Service: Cardiovascular;  Laterality: Left;   CORONARY STENT INTERVENTION N/A 02/25/2020   Procedure: CORONARY STENT INTERVENTION;  Surgeon: Mady Bruckner, MD;  Location: [facility] INVASIVE CV LAB;  Service: Cardiovascular;  Laterality: N/A;   LEFT HEART CATH AND CORONARY ANGIOGRAPHY N/A 08/16/2019   Procedure: LEFT HEART CATH AND CORONARY ANGIOGRAPHY;  Surgeon: Mady Bruckner, MD;  Location: [facility] INVASIVE CV LAB;  Service: Cardiovascular;  Laterality: N/A;   LEFT HEART CATH AND CORONARY ANGIOGRAPHY N/A 02/25/2020   Procedure: LEFT HEART CATH  AND CORONARY ANGIOGRAPHY;  Surgeon: Mady Bruckner, MD;  Location: [facility] INVASIVE CV LAB;  Service: Cardiovascular;  Laterality: N/A;   RIGHT/LEFT HEART CATH AND CORONARY ANGIOGRAPHY N/A 06/18/2020   Procedure: RIGHT/LEFT HEART CATH AND CORONARY ANGIOGRAPHY;  Surgeon: Darron Deatrice LABOR, MD;  Location: [facility] INVASIVE CV LAB;  Service: Cardiovascular;  Laterality: N/A;   SHOULDER SURGERY Bilateral    UPPER EXTREMITY ANGIOGRAPHY Right 08/21/2019   Procedure: UPPER EXTREMITY ANGIOGRAPH;  Surgeon: Marea Selinda RAMAN, MD;  Location: [facility] INVASIVE CV LAB;  Service: Cardiovascular;  Laterality: Right;   Patient Active Problem List   Diagnosis Date Noted   Colon cancer screening 10/05/2022   Myocardial infarction (HCC) 08/19/2022   Degenerative joint disease involving multiple joints on both sides of body 05/11/2021   Acute non-ST segment elevation myocardial infarction (HCC) 11/30/2020   Prediabetes 11/30/2020   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   Tachycardia 11/29/2020   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)    Dyspnea    Hyperlipidemia    Gastroesophageal reflux disease without esophagitis    Chest pain 02/25/2020   Neutrophilic leukocytosis 02/25/2020   Essential hypertension    Atrial fibrillation with RVR (HCC) 08/16/2019   NSTEMI (non-ST elevated myocardial infarction) (HCC) 08/15/2019   Hematoma 08/15/2019   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 Atraumatic  onset. Pt has referral for neck pain/spasm and balance - no Hx of falls. Pt reports being unbalanced. Pt reports being told by referring provider he thought neck was result of muscle spasm. Hx of L RCR x 3. Patient reports not being as strong in L arm/shoulder. Pt reports difficulty with turning her head. Pt reports she has to hold her head still. L>R-sided neck pain. Pt reports pain/tingling along L suboccipital region with referral in ram's horn pattern around L ear. Pt reports trying to massage neck without relief.   Pain:  Pain Intensity: Present: 5/10, Best: 0/10, Worst: 10/10 Pain location: L SCM/L cervical paraspinal musculature  Pain Quality: aching  Radiating: Yes, around L suboccipital/peri-aural region Numbness/Tingling: Yes; into L suboccipital region, L hand can go numb with holding elbow in flexed position lon enough  Focal Weakness: No Aggravating factors: L rotation worse than R rotation, overhead activity  Relieving factors: Biofreeze, heat, refraining from notable movement 24-hour pain behavior: None  History of prior neck injury, pain, surgery, or therapy: Yes; Right RCR x 1, L RCR x 3; pt given home exercise sheet in past  Dominant hand: right Imaging: No   Red flags (personal history of cancer, h/o spinal tumors, history of compression fracture, chills/fever, night sweats, nausea, vomiting, unrelenting pain):  Hx of chronic lymphcytic leukemia  PRECAUTIONS: None  WEIGHT BEARING RESTRICTIONS: No  FALLS: Has patient fallen in last 6 months? 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   Patient Goals: Help my neck     OBJECTIVE (data from initial evaluation unless otherwise dated):    Patient Surveys  NDI = 17/50 = 34%  Posture L scapula sits higher than R at rest  AROM AROM (Normal range in degrees) AROM 08/10/23  Cervical  Flexion (50) 28*  Extension (80) 30  Right lateral flexion (45) 8  Left lateral flexion (45) 12  Right rotation (85) 20  Left rotation (85) 20  (* = pain; Blank rows = not tested)   Shoulder AROM: Flexion R 130 L 120; ABD 130 bilat; Functional ER: R T2, L C7; Functional IR: R T12, L T12   MMT MMT (out of 5) Right 08/10/23 Left 08/10/23      Shoulder   Flexion 4- 4-  Extension    Abduction 4- 4- (mild pain L arm)  Internal rotation 5 5  External rotation 4- 4-  Horizontal abduction    Horizontal adduction    Lower Trapezius    Rhomboids        Elbow  Flexion 5 5  Extension 4+ 4+  Pronation  Supination        Wrist  Flexion    Extension 5 5  Radial deviation    Ulnar deviation        (* = pain; Blank rows = not tested)  Reflexes R/L Elbow: 2+/1+  Brachioradialis: 2+/2+  Tricep: 2+/1+  Palpation Location LEFT  RIGHT           Suboccipitals    Cervical paraspinals 1 1  Upper Trapezius 2 2  Levator Scapulae 2 2  Rhomboid Major/Minor 2 2  (Blank rows = not tested) Graded on 0-4 scale (0 = no pain, 1 = pain, 2 = pain with wincing/grimacing/flinching, 3 = pain with withdrawal, 4 = unwilling to allow palpation), (Blank rows = not tested)  Repeated Movements Deferred   Passive Accessory Intervertebral Motion Decreased C3-6 R to L sideglide, Decreased C3-5 L to R sideglide.     SPECIAL TESTS Spurlings A (ipsilateral lateral flexion/axial compression): R: Localized neck pain, no upper limb pain/paresthesias,  L: Localized neck pain, no upper limb pain/paresthesias Distraction Test: Negative  Hoffman Sign  (cervical cord compression): R: Not examined L: Not examined ULTT Median: R: Not examined L: Not examined ULTT Ulnar: R: Not examined L: Not examined ULTT Radial: R: Not examined L: Not examined   OUTCOME MEASURES 5TSTS: 10.1 sec TUG: 9.2    BERG: 49/56 (08/21/23)   ____________________________    Clinical Test of Sensory Interaction for Balance    (CTSIB):  CONDITION TIME STRATEGY SWAY  Eyes open, firm surface 30 seconds ankle   Eyes closed, firm surface 30 seconds ankle   Eyes open, foam surface 30 seconds ankle   Eyes closed, foam surface 30 seconds ankle      FUNCTIONAL TASKS  Gait: Normal step cadence, shortened stride length, mild forward flexed posture  Sit to stand: Readily performed with no upper limb support on first attempted, no heavy trunk lean   Stair negotiation: Safe reciprocal stair negotiation, no UE support needed    LE Strength* R/L 4/4- Hip flexion 4+/4+ Hip abduction (seated) 5/5 Hip adduction (seated) 4+/4+ Knee extension 5/5 Knee flexion 4-/4 Ankle Dorsiflexion 4/4 Ankle Plantarflexion *indicates pain     TODAY'S TREATMENT    09/04/2023   SUBJECTIVE STATEMENT:   Patient reports some L upper arm and L shoulder pain at arrival to PT. Patient reports no major issues with her home program. Patient reports pain toward mastoid behind ear on her L side. Patient reports no pain with no movement. She reports 8/10 pain with attempted movement.    Manual Therapy - for symptom modulation, soft tissue sensitivity and mobility, joint mobility, ROM   STM/DTM L>R upper trap, levator scapulae, C3-6 splenius cervicis/capitis, L SCM x 15 minutes   -emphasis on L SCM closer to insertion today TPR bilat UT; x 3 minute bouts for each  Gentle passive cervical spine rotation with contralateral anterior facet glide; x 5 ea dir   Attempted C2-3 SNAG with rotation, consistent pain with pressure onto upper cervical facets - discontinued  MET for cervical rotation  with isometric contraction of antagonist muscle group; x 5 for R and L rotation   *not today* Passive sidebend stretch to R and L with scapular depression; x 30 sec ea dir Suboccipital STM; x 5 minutes   AROM  C-spine rotation: R 55, L 56   MHP (unbilled) utilized during manual therapy for analgesic effect and improved soft tissue extensibility, x 5 min     Therapeutic Exercise - for  improved soft tissue flexibility and extensibility as needed for ROM, periscapular isometrics and isotonics to improve postural endurance; upper limb AROM/AAROM as needed for reaching  Self-SNAG with towel for cervical spine rotation; reviewed  PATIENT EDUCATION: Reviewed HEP and discussed PT POC. Discussed holding on Tband exercises for shoulders today and continuing with supine/unloaded AAROM exercise at home as tolerated.    *not today* Standing high row with Red Tband; x15, for eccentric phase AAROM for forward elevation Standing shoulder flexion AAROM with wand; 1x10, back to wall for postural cue Supine shoulder flexion AAROM with wand; 1x10  -cueing for ensuring minimal pain with ROM with some axillary discomfort at full end-range  Sit to stand; reviewed  Standing HR/TR; 1 x 10 - for HEP review     PATIENT EDUCATION:  Education details: see above for patient education details Person educated: Patient Education method: Explanation, Demonstration, and Handouts Education comprehension: verbalized understanding and returned demonstration   HOME EXERCISE PROGRAM:  Access Code: [email or web address] URL: [email or web address] Date: 08/16/2023 Prepared by: Venetia Endo  Exercises - Seated Upper Trapezius Stretch  - 2 x daily - 7 x weekly - 3 sets - 30sec hold - Seated Levator Scapulae Stretch  - 2 x daily - 7 x weekly - 3 sets - 30sec hold - Seated Scapular Retraction  - 2 x daily - 7 x weekly - 2 sets - 10 reps - 5sec hold - Seated Assisted Cervical Rotation with Towel  - 2 x daily  - 7 x weekly - 2 sets - 10 reps - 1sec hold  Access Code: [email or web address] URL: [email or web address] Date: 08/21/2023 Prepared by: Venetia Endo  Exercises - Heel Toe Raises with Counter Support  - 2 x daily - 7 x weekly - 2-3 sets - 10-15 reps - Sit to Stand Without Arm Support  - 2 x daily - 7 x weekly - 2-3 sets - 10 reps - Standing Tandem Balance with Counter Support  - 2 x daily - 7 x weekly - 3 sets - 30sec hold   ASSESSMENT:  CLINICAL IMPRESSION: Pt has high NPRS at baseline today with most pain affecting L paracervical region near mastoid and along L upper arm. Pt has Hx of multiple rotator cuff repairs with suboptimal outcome and remaining L shoulder mobility and strength deficits. We have incorporated shoulder AAROM work to reduce shoulder hiking compensation and overworking of upper trapezius/scapular elevators. We discussed holding on banded exercises today, and we focused on manual therapy and cervical spine mobility. Moist heat was used for analgesic effect to improve soft tissue mobility for ROM work. She does report feeling better later into session today.  Pt has current deficits in cervical spine and shoulder AROM, postural dysfunction, taut/tender L>R UT/LS, C-spine stiffness, and dec UE strength. Pt will continue to benefit from skilled PT services to address deficits and improve function.   OBJECTIVE IMPAIRMENTS: decreased ROM, decreased strength, hypomobility, increased muscle spasms, impaired flexibility, impaired UE functional use, postural dysfunction, and pain.   ACTIVITY LIMITATIONS: carrying, lifting, bending, and reach over head  PARTICIPATION LIMITATIONS: meal prep, cleaning, laundry, community activity, and yard work  PERSONAL FACTORS: Age, Past/current experiences, Time since onset of injury/illness/exacerbation, and 3+ comorbidities: (Hx of MI, GERD, OA, chronic lymphocytic leukemia)  are also affecting patient's functional outcome.   REHAB  POTENTIAL: Good  CLINICAL DECISION MAKING: Evolving/moderate complexity  EVALUATION COMPLEXITY: Moderate   GOALS: Goals reviewed with patient? Yes  SHORT TERM GOALS: Target date: 08/31/2023  Pt will be independent with  HEP to improve strength and decrease neck pain to improve pain-free function at home and work. Baseline: 08/10/23: Baseline HEP initiated.  Goal status: INITIAL   LONG TERM GOALS: Target date: 10/05/2023  Pt will demonstrate cervical spine flexion to 50 deg and bilat rotation to 60 deg or greater as needed for scanning environment and functional ROM Baseline: 08/10/23: Significant motion loss (see chart above) Goal status: INITIAL  2.  Pt will decrease worst neck pain by at least 2 points on the NPRS in order to demonstrate clinically significant reduction in neck pain. Baseline: 08/10/23:  Goal status: INITIAL  3.  Pt will decrease NDI score by at least 19% in order demonstrate clinically significant reduction in neck pain/disability.       Baseline: 08/10/23: 34% Goal status: INITIAL  4.  Pt will improve BERG by 3 points or greater indicative of clinically meaningful improvement in pt's balance and fall risk Baseline: 08/10/23: BERG to be completed at visit # 2-3.    08/21/23: 49/56 Goal status: INITIAL   PLAN: PT FREQUENCY: 1-2x/week  PT DURATION: 6 weeks  PLANNED INTERVENTIONS: Therapeutic exercises, Therapeutic activity, Neuromuscular re-education, Balance training, Gait training, Patient/Family education, Self Care, Joint mobilization, Joint manipulation, Vestibular training, Canalith repositioning, Orthotic/Fit training, DME instructions, Dry Needling, Electrical stimulation, Spinal manipulation, Spinal mobilization, Cryotherapy, Moist heat, Taping, Traction, Ultrasound, Ionotophoresis 4mg /ml Dexamethasone, Manual therapy, and Re-evaluation.  PLAN FOR NEXT SESSION: Manual techniques for L>R upper trap, levator scapulae, rhomboid musculature. MET and  mobilization techniques to improve C-spine AROM. Update HEP for upper quarter pain or strengthening/balance training prn.   Venetia Endo, PT, DPT #[id number]  Venetia ONEIDA Endo, PT 09/04/2023, 2:25 PM

## 2023-09-06 ENCOUNTER — Ambulatory Visit: Admitting: Physical Therapy

## 2023-09-06 DIAGNOSIS — M542 Cervicalgia: Secondary | ICD-10-CM | POA: Diagnosis not present

## 2023-09-06 DIAGNOSIS — R262 Difficulty in walking, not elsewhere classified: Secondary | ICD-10-CM

## 2023-09-06 DIAGNOSIS — M62838 Other muscle spasm: Secondary | ICD-10-CM

## 2023-09-06 DIAGNOSIS — R2689 Other abnormalities of gait and mobility: Secondary | ICD-10-CM

## 2023-09-06 NOTE — Therapy (Unsigned)
 OUTPATIENT PHYSICAL THERAPY TREATMENT   Patient Name: Cheryl Briggs MRN: [id number] DOB:[birth year], 78 y.o., female Today's Date: 09/06/2023  END OF SESSION:  PT End of Session - 09/06/23 0904     Visit Number 8    Number of Visits 17    Date for PT Re-Evaluation 10/05/23    PT Start Time 0902    PT Stop Time 0942    PT Time Calculation (min) 40 min    Activity Tolerance Patient tolerated treatment well    Behavior During Therapy [facility] for tasks assessed/performed            Past Medical History:  Diagnosis Date   Allergy    Arthritis    Cancer (HCC)    CLL   GERD (gastroesophageal reflux disease)    Myocardial infarction [facility])    Past Surgical History:  Procedure Laterality Date   BACK SURGERY     BLADDER REPAIR     CARDIAC CATHETERIZATION     carpel tunnell Bilateral    CHOLECYSTECTOMY     COLONOSCOPY  2015   COLONOSCOPY WITH PROPOFOL  N/A 10/05/2022   Procedure: COLONOSCOPY WITH PROPOFOL ;  Surgeon: Therisa Bi, MD;  Location: [facility];  Service: Gastroenterology;  Laterality: N/A;   CORONARY STENT INTERVENTION N/A 08/16/2019   Procedure: CORONARY STENT INTERVENTION;  Surgeon: Mady Bruckner, MD;  Location: [facility];  Service: Cardiovascular;  Laterality: N/A;   CORONARY STENT INTERVENTION Left 08/19/2019   Procedure: CORONARY STENT INTERVENTION;  Surgeon: Darron Deatrice LABOR, MD;  Location: [facility];  Service: Cardiovascular;  Laterality: Left;   CORONARY STENT INTERVENTION N/A 02/25/2020   Procedure: CORONARY STENT INTERVENTION;  Surgeon: Mady Bruckner, MD;  Location: [facility];  Service: Cardiovascular;  Laterality: N/A;   LEFT HEART CATH AND CORONARY ANGIOGRAPHY N/A 08/16/2019   Procedure: LEFT HEART CATH AND CORONARY ANGIOGRAPHY;  Surgeon: Mady Bruckner, MD;  Location: [facility];  Service: Cardiovascular;  Laterality: N/A;   LEFT HEART CATH AND CORONARY ANGIOGRAPHY N/A 02/25/2020   Procedure: LEFT HEART CATH  AND CORONARY ANGIOGRAPHY;  Surgeon: Mady Bruckner, MD;  Location: [facility];  Service: Cardiovascular;  Laterality: N/A;   RIGHT/LEFT HEART CATH AND CORONARY ANGIOGRAPHY N/A 06/18/2020   Procedure: RIGHT/LEFT HEART CATH AND CORONARY ANGIOGRAPHY;  Surgeon: Darron Deatrice LABOR, MD;  Location: [facility];  Service: Cardiovascular;  Laterality: N/A;   SHOULDER SURGERY Bilateral    UPPER EXTREMITY ANGIOGRAPHY Right 08/21/2019   Procedure: UPPER EXTREMITY ANGIOGRAPH;  Surgeon: Marea Selinda RAMAN, MD;  Location: [facility];  Service: Cardiovascular;  Laterality: Right;   Patient Active Problem List   Diagnosis Date Noted   Colon cancer screening 10/05/2022   Myocardial infarction (HCC) 08/19/2022   Degenerative joint disease involving multiple joints on both sides of body 05/11/2021   Acute non-ST segment elevation myocardial infarction (HCC) 11/30/2020   Prediabetes 11/30/2020   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   Tachycardia 11/29/2020   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)    Dyspnea    Hyperlipidemia    Gastroesophageal reflux disease without esophagitis    Chest pain 02/25/2020   Neutrophilic leukocytosis 02/25/2020   Essential hypertension    Atrial fibrillation with RVR (HCC) 08/16/2019   NSTEMI (non-ST elevated myocardial infarction) (HCC) 08/15/2019   Hematoma 08/15/2019   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 Atraumatic  onset. Pt has referral for neck pain/spasm and balance - no Hx of falls. Pt reports being unbalanced. Pt reports being told by referring provider he thought neck was result of muscle spasm. Hx of L RCR x 3. Patient reports not being as strong in L arm/shoulder. Pt reports difficulty with turning her head. Pt reports she has to hold her head still. L>R-sided neck pain. Pt reports pain/tingling along L suboccipital region with referral in ram's horn pattern around L ear. Pt reports trying to massage neck without relief.   Pain:  Pain Intensity: Present: 5/10, Best: 0/10, Worst: 10/10 Pain location: L SCM/L cervical paraspinal musculature  Pain Quality: aching  Radiating: Yes, around L suboccipital/peri-aural region Numbness/Tingling: Yes; into L suboccipital region, L hand can go numb with holding elbow in flexed position lon enough  Focal Weakness: No Aggravating factors: L rotation worse than R rotation, overhead activity  Relieving factors: Biofreeze, heat, refraining from notable movement 24-hour pain behavior: None  History of prior neck injury, pain, surgery, or therapy: Yes; Right RCR x 1, L RCR x 3; pt given home exercise sheet in past  Dominant hand: right Imaging: No   Red flags (personal history of cancer, h/o spinal tumors, history of compression fracture, chills/fever, night sweats, nausea, vomiting, unrelenting pain):  Hx of chronic lymphcytic leukemia  PRECAUTIONS: None  WEIGHT BEARING RESTRICTIONS: No  FALLS: Has patient fallen in last 6 months? 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   Patient Goals: Help my neck     OBJECTIVE (data from initial evaluation unless otherwise dated):    Patient Surveys  NDI = 17/50 = 34%  Posture L scapula sits higher than R at rest  AROM AROM (Normal range in degrees) AROM 08/10/23  Cervical  Flexion (50) 28*  Extension (80) 30  Right lateral flexion (45) 8  Left lateral flexion (45) 12  Right rotation (85) 20  Left rotation (85) 20  (* = pain; Blank rows = not tested)   Shoulder AROM: Flexion R 130 L 120; ABD 130 bilat; Functional ER: R T2, L C7; Functional IR: R T12, L T12   MMT MMT (out of 5) Right 08/10/23 Left 08/10/23      Shoulder   Flexion 4- 4-  Extension    Abduction 4- 4- (mild pain L arm)  Internal rotation 5 5  External rotation 4- 4-  Horizontal abduction    Horizontal adduction    Lower Trapezius    Rhomboids        Elbow  Flexion 5 5  Extension 4+ 4+  Pronation  Supination        Wrist  Flexion    Extension 5 5  Radial deviation    Ulnar deviation        (* = pain; Blank rows = not tested)  Reflexes R/L Elbow: 2+/1+  Brachioradialis: 2+/2+  Tricep: 2+/1+  Palpation Location LEFT  RIGHT           Suboccipitals    Cervical paraspinals 1 1  Upper Trapezius 2 2  Levator Scapulae 2 2  Rhomboid Major/Minor 2 2  (Blank rows = not tested) Graded on 0-4 scale (0 = no pain, 1 = pain, 2 = pain with wincing/grimacing/flinching, 3 = pain with withdrawal, 4 = unwilling to allow palpation), (Blank rows = not tested)  Repeated Movements Deferred   Passive Accessory Intervertebral Motion Decreased C3-6 R to L sideglide, Decreased C3-5 L to R sideglide.     SPECIAL TESTS Spurlings A (ipsilateral lateral flexion/axial compression): R: Localized neck pain, no upper limb pain/paresthesias,  L: Localized neck pain, no upper limb pain/paresthesias Distraction Test: Negative  Hoffman Sign  (cervical cord compression): R: Not examined L: Not examined ULTT Median: R: Not examined L: Not examined ULTT Ulnar: R: Not examined L: Not examined ULTT Radial: R: Not examined L: Not examined   OUTCOME MEASURES 5TSTS: 10.1 sec TUG: 9.2    BERG: 49/56 (08/21/23)   ____________________________    Clinical Test of Sensory Interaction for Balance    (CTSIB):  CONDITION TIME STRATEGY SWAY  Eyes open, firm surface 30 seconds ankle   Eyes closed, firm surface 30 seconds ankle   Eyes open, foam surface 30 seconds ankle   Eyes closed, foam surface 30 seconds ankle      FUNCTIONAL TASKS  Gait: Normal step cadence, shortened stride length, mild forward flexed posture  Sit to stand: Readily performed with no upper limb support on first attempted, no heavy trunk lean   Stair negotiation: Safe reciprocal stair negotiation, no UE support needed    LE Strength* R/L 4/4- Hip flexion 4+/4+ Hip abduction (seated) 5/5 Hip adduction (seated) 4+/4+ Knee extension 5/5 Knee flexion 4-/4 Ankle Dorsiflexion 4/4 Ankle Plantarflexion *indicates pain     TODAY'S TREATMENT    09/06/2023   SUBJECTIVE STATEMENT:   Patient reports 2/10 pain at arrival to PT; more pain with turning head/moving head and neck.    Manual Therapy - for symptom modulation, soft tissue sensitivity and mobility, joint mobility, ROM   STM/DTM L>R upper trap, levator scapulae, C3-6 splenius cervicis/capitis, L SCM x 15 minutes   -emphasis on L SCM closer to insertion today TPR bilat UT; x 3 minute bouts for each  Gentle passive cervical spine rotation with contralateral anterior facet glide; x 5 ea dir  Sideglide C3-6 bilaterally; 2 x 30 sec bouts  *not today* MET for cervical rotation with isometric contraction of antagonist muscle group; x 5 for R and L rotation Attempted C2-3 SNAG with rotation, consistent pain with pressure onto upper cervical facets - discontinued Passive sidebend stretch to R and L with  scapular depression; x 30 sec ea dir Suboccipital STM; x 5 minutes  MHP (unbilled) utilized during manual therapy for analgesic effect and improved soft tissue extensibility, x 5 min     AROM C-spine flexion: 45 C-spine extension: 32  C-spine rotation: R 55, L 56   Therapeutic Exercise - for improved soft tissue flexibility and extensibility as needed for ROM, periscapular isometrics and isotonics to improve postural endurance; upper limb AROM/AAROM as  needed for reaching  Self-SNAG with towel for cervical spine rotation; reviewed  Pulleys; x 2 min for shoulder forward elevation AAROM Wall slide with bilat UE c towel; x15, shoulder flexion AAROM  PATIENT EDUCATION: Reviewed HEP and discussed PT POC.    *not today* Standing high row with Red Tband; x15, for eccentric phase AAROM for forward elevation Standing shoulder flexion AAROM with wand; 1x10, back to wall for postural cue Supine shoulder flexion AAROM with wand; 1x10  -cueing for ensuring minimal pain with ROM with some axillary discomfort at full end-range  Sit to stand; reviewed  Standing HR/TR; 1 x 10 - for HEP review     PATIENT EDUCATION:  Education details: see above for patient education details Person educated: Patient Education method: Explanation, Demonstration, and Handouts Education comprehension: verbalized understanding and returned demonstration   HOME EXERCISE PROGRAM:  Access Code: [email or web address] URL: [email or web address] Date: 08/16/2023 Prepared by: Venetia Endo  Exercises - Seated Upper Trapezius Stretch  - 2 x daily - 7 x weekly - 3 sets - 30sec hold - Seated Levator Scapulae Stretch  - 2 x daily - 7 x weekly - 3 sets - 30sec hold - Seated Scapular Retraction  - 2 x daily - 7 x weekly - 2 sets - 10 reps - 5sec hold - Seated Assisted Cervical Rotation with Towel  - 2 x daily - 7 x weekly - 2 sets - 10 reps - 1sec hold  Access Code: [email or web address] URL:  [email or web address] Date: 08/21/2023 Prepared by: Venetia Endo  Exercises - Heel Toe Raises with Counter Support  - 2 x daily - 7 x weekly - 2-3 sets - 10-15 reps - Sit to Stand Without Arm Support  - 2 x daily - 7 x weekly - 2-3 sets - 10 reps - Standing Tandem Balance with Counter Support  - 2 x daily - 7 x weekly - 3 sets - 30sec hold   ASSESSMENT:  CLINICAL IMPRESSION: Patient exhibits markedly improving ROM in all planes of motion. Her rotation to either direction has gotten better significantly, but she is just short of WFL ROM. Pt has notably taut/tender SCM near mastoid process attachment; sensitivity of upper traps is markedly less. Pt has remaining deficits in cervical spine and shoulder AROM, postural dysfunction, taut/tender L>R UT/LS, C-spine stiffness, and dec UE strength. Pt will continue to benefit from skilled PT services to address deficits and improve function.   OBJECTIVE IMPAIRMENTS: decreased ROM, decreased strength, hypomobility, increased muscle spasms, impaired flexibility, impaired UE functional use, postural dysfunction, and pain.   ACTIVITY LIMITATIONS: carrying, lifting, bending, and reach over head  PARTICIPATION LIMITATIONS: meal prep, cleaning, laundry, community activity, and yard work  PERSONAL FACTORS: Age, Past/current experiences, Time since onset of injury/illness/exacerbation, and 3+ comorbidities: (Hx of MI, GERD, OA, chronic lymphocytic leukemia)  are also affecting patient's functional outcome.   REHAB POTENTIAL: Good  CLINICAL DECISION MAKING: Evolving/moderate complexity  EVALUATION COMPLEXITY: Moderate   GOALS: Goals reviewed with patient? Yes  SHORT TERM GOALS: Target date: 08/31/2023  Pt will be independent with HEP to improve strength and decrease neck pain to improve pain-free function at home and work. Baseline: 08/10/23: Baseline HEP initiated.  Goal status: INITIAL   LONG TERM GOALS: Target date:  10/05/2023  Pt will demonstrate cervical spine flexion to 50 deg and bilat rotation to 60 deg or greater as needed for scanning environment and functional ROM Baseline: 08/10/23: Significant motion loss (see chart above) Goal status: INITIAL  2.  Pt will  decrease worst neck pain by at least 2 points on the NPRS in order to demonstrate clinically significant reduction in neck pain. Baseline: 08/10/23:  Goal status: INITIAL  3.  Pt will decrease NDI score by at least 19% in order demonstrate clinically significant reduction in neck pain/disability.       Baseline: 08/10/23: 34% Goal status: INITIAL  4.  Pt will improve BERG by 3 points or greater indicative of clinically meaningful improvement in pt's balance and fall risk Baseline: 08/10/23: BERG to be completed at visit # 2-3.    08/21/23: 49/56 Goal status: INITIAL   PLAN: PT FREQUENCY: 1-2x/week  PT DURATION: 6 weeks  PLANNED INTERVENTIONS: Therapeutic exercises, Therapeutic activity, Neuromuscular re-education, Balance training, Gait training, Patient/Family education, Self Care, Joint mobilization, Joint manipulation, Vestibular training, Canalith repositioning, Orthotic/Fit training, DME instructions, Dry Needling, Electrical stimulation, Spinal manipulation, Spinal mobilization, Cryotherapy, Moist heat, Taping, Traction, Ultrasound, Ionotophoresis 4mg /ml Dexamethasone, Manual therapy, and Re-evaluation.  PLAN FOR NEXT SESSION: Manual techniques for L>R upper trap, levator scapulae, rhomboid musculature. MET and mobilization techniques to improve C-spine AROM. Update HEP for upper quarter pain or strengthening/balance training prn.   Venetia Endo, PT, DPT #[id number]  Venetia ONEIDA Endo, PT 09/06/2023, 9:04 AM

## 2023-09-07 ENCOUNTER — Encounter: Payer: Self-pay | Admitting: Physical Therapy

## 2023-09-12 ENCOUNTER — Ambulatory Visit: Admitting: Physical Therapy

## 2023-09-12 NOTE — Therapy (Deleted)
 OUTPATIENT PHYSICAL THERAPY TREATMENT   Patient Name: Cheryl Briggs MRN: [id number] DOB:Jun 02, 1945, 78 y.o., female Today's Date: 09/12/2023  END OF SESSION:      Past Medical History:  Diagnosis Date   Allergy    Arthritis    Cancer (HCC)    CLL   GERD (gastroesophageal reflux disease)    Myocardial infarction [facility])    Past Surgical History:  Procedure Laterality Date   BACK SURGERY     BLADDER REPAIR     CARDIAC CATHETERIZATION     carpel tunnell Bilateral    CHOLECYSTECTOMY     COLONOSCOPY  2015   COLONOSCOPY WITH PROPOFOL  N/A 10/05/2022   Procedure: COLONOSCOPY WITH PROPOFOL ;  Surgeon: Therisa Bi, MD;  Location: [facility] ENDOSCOPY;  Service: Gastroenterology;  Laterality: N/A;   CORONARY STENT INTERVENTION N/A 08/16/2019   Procedure: CORONARY STENT INTERVENTION;  Surgeon: Mady Bruckner, MD;  Location: [facility] INVASIVE CV LAB;  Service: Cardiovascular;  Laterality: N/A;   CORONARY STENT INTERVENTION Left 08/19/2019   Procedure: CORONARY STENT INTERVENTION;  Surgeon: Darron Deatrice LABOR, MD;  Location: [facility] INVASIVE CV LAB;  Service: Cardiovascular;  Laterality: Left;   CORONARY STENT INTERVENTION N/A 02/25/2020   Procedure: CORONARY STENT INTERVENTION;  Surgeon: Mady Bruckner, MD;  Location: [facility] INVASIVE CV LAB;  Service: Cardiovascular;  Laterality: N/A;   LEFT HEART CATH AND CORONARY ANGIOGRAPHY N/A 08/16/2019   Procedure: LEFT HEART CATH AND CORONARY ANGIOGRAPHY;  Surgeon: Mady Bruckner, MD;  Location: [facility] INVASIVE CV LAB;  Service: Cardiovascular;  Laterality: N/A;   LEFT HEART CATH AND CORONARY ANGIOGRAPHY N/A 02/25/2020   Procedure: LEFT HEART CATH AND CORONARY ANGIOGRAPHY;  Surgeon: Mady Bruckner, MD;  Location: [facility] INVASIVE CV LAB;  Service: Cardiovascular;  Laterality: N/A;   RIGHT/LEFT HEART CATH AND CORONARY ANGIOGRAPHY N/A 06/18/2020   Procedure: RIGHT/LEFT HEART CATH AND CORONARY ANGIOGRAPHY;  Surgeon: Darron Deatrice LABOR, MD;  Location: [facility] INVASIVE CV LAB;   Service: Cardiovascular;  Laterality: N/A;   SHOULDER SURGERY Bilateral    UPPER EXTREMITY ANGIOGRAPHY Right 08/21/2019   Procedure: UPPER EXTREMITY ANGIOGRAPH;  Surgeon: Marea Selinda RAMAN, MD;  Location: [facility] INVASIVE CV LAB;  Service: Cardiovascular;  Laterality: Right;   Patient Active Problem List   Diagnosis Date Noted   Colon cancer screening 10/05/2022   Myocardial infarction (HCC) 08/19/2022   Degenerative joint disease involving multiple joints on both sides of body 05/11/2021   Acute non-ST segment elevation myocardial infarction (HCC) 11/30/2020   Prediabetes 11/30/2020   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   Tachycardia 11/29/2020   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)    Dyspnea    Hyperlipidemia    Gastroesophageal reflux disease without esophagitis    Chest pain 02/25/2020   Neutrophilic leukocytosis 02/25/2020   Essential hypertension    Atrial fibrillation with RVR (HCC) 08/16/2019   NSTEMI (non-ST elevated myocardial infarction) (HCC) 08/15/2019   Hematoma 08/15/2019   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 Atraumatic onset. Pt has referral for neck pain/spasm and balance - no Hx  of falls. Pt reports being unbalanced. Pt reports being told by referring provider he thought neck was result of muscle spasm. Hx of L RCR x 3. Patient reports not being as strong in L arm/shoulder. Pt reports difficulty with turning her head. Pt reports she  has to hold her head still. L>R-sided neck pain. Pt reports pain/tingling along L suboccipital region with referral in ram's horn pattern around L ear. Pt reports trying to massage neck without relief.   Pain:  Pain Intensity: Present: 5/10, Best: 0/10, Worst: 10/10 Pain location: L SCM/L cervical paraspinal musculature  Pain Quality: aching  Radiating: Yes, around L suboccipital/peri-aural region Numbness/Tingling: Yes; into L suboccipital region, L hand can go numb with holding elbow in flexed position lon enough  Focal Weakness: No Aggravating factors: L rotation worse than R rotation, overhead activity  Relieving factors: Biofreeze, heat, refraining from notable movement 24-hour pain behavior: None  History of prior neck injury, pain, surgery, or therapy: Yes; Right RCR x 1, L RCR x 3; pt given home exercise sheet in past  Dominant hand: right Imaging: No   Red flags (personal history of cancer, h/o spinal tumors, history of compression fracture, chills/fever, night sweats, nausea, vomiting, unrelenting pain):  Hx of chronic lymphcytic leukemia  PRECAUTIONS: None  WEIGHT BEARING RESTRICTIONS: No  FALLS: Has patient fallen in last 6 months? 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   Patient Goals: Help my neck     OBJECTIVE (data from initial evaluation unless otherwise dated):    Patient Surveys  NDI = 17/50 = 34%  Posture L scapula sits higher than R at rest  AROM AROM (Normal range in degrees)  AROM 08/10/23  Cervical  Flexion (50) 28*  Extension (80) 30  Right lateral flexion (45) 8  Left lateral flexion (45) 12  Right rotation (85) 20  Left rotation (85) 20  (* = pain; Blank rows = not tested)   Shoulder AROM: Flexion R 130 L 120; ABD 130 bilat; Functional ER: R T2, L C7; Functional IR: R T12, L T12   MMT MMT (out of 5) Right 08/10/23 Left 08/10/23      Shoulder   Flexion 4- 4-  Extension    Abduction 4- 4- (mild pain L arm)  Internal rotation 5 5  External rotation 4- 4-  Horizontal abduction    Horizontal adduction    Lower Trapezius    Rhomboids        Elbow  Flexion 5 5  Extension 4+ 4+  Pronation    Supination        Wrist  Flexion    Extension 5 5  Radial deviation    Ulnar deviation        (* = pain; Blank rows = not tested)  Reflexes R/L Elbow: 2+/1+  Brachioradialis: 2+/2+  Tricep: 2+/1+  Palpation Location LEFT  RIGHT           Suboccipitals    Cervical paraspinals 1 1  Upper  Trapezius 2 2  Levator Scapulae 2 2  Rhomboid Major/Minor 2 2  (Blank rows = not tested) Graded on 0-4 scale (0 = no pain, 1 = pain, 2 = pain with wincing/grimacing/flinching, 3 = pain with withdrawal, 4 = unwilling to allow palpation), (Blank rows = not tested)  Repeated Movements Deferred   Passive Accessory Intervertebral Motion Decreased C3-6 R to L sideglide, Decreased C3-5 L to R sideglide.     SPECIAL TESTS Spurlings A (ipsilateral lateral flexion/axial compression): R: Localized neck pain, no upper limb pain/paresthesias,  L: Localized neck pain, no upper limb pain/paresthesias Distraction Test: Negative  Hoffman Sign (cervical cord compression): R: Not examined L: Not examined ULTT Median: R: Not examined L: Not examined ULTT Ulnar: R: Not examined L: Not examined ULTT Radial: R: Not examined L: Not examined   OUTCOME MEASURES 5TSTS: 10.1 sec TUG: 9.2    BERG: 49/56 (08/21/23)   ____________________________    Clinical Test of  Sensory Interaction for Balance    (CTSIB):  CONDITION TIME STRATEGY SWAY  Eyes open, firm surface 30 seconds ankle   Eyes closed, firm surface 30 seconds ankle   Eyes open, foam surface 30 seconds ankle   Eyes closed, foam surface 30 seconds ankle      FUNCTIONAL TASKS  Gait: Normal step cadence, shortened stride length, mild forward flexed posture  Sit to stand: Readily performed with no upper limb support on first attempted, no heavy trunk lean   Stair negotiation: Safe reciprocal stair negotiation, no UE support needed    LE Strength* R/L 4/4- Hip flexion 4+/4+ Hip abduction (seated) 5/5 Hip adduction (seated) 4+/4+ Knee extension 5/5 Knee flexion 4-/4 Ankle Dorsiflexion 4/4 Ankle Plantarflexion *indicates pain     TODAY'S TREATMENT    09/12/2023   SUBJECTIVE STATEMENT:   Patient reports 2/10 pain at arrival to PT; more pain with turning head/moving head and neck.    Manual Therapy - for symptom modulation, soft tissue sensitivity and mobility, joint mobility, ROM   STM/DTM L>R upper trap, levator scapulae, C3-6 splenius cervicis/capitis, L SCM x 15 minutes   -emphasis on L SCM closer to insertion today TPR bilat UT; x 3 minute bouts for each  Gentle passive cervical spine rotation with contralateral anterior facet glide; x 5 ea dir  Sideglide C3-6 bilaterally; 2 x 30 sec bouts  *not today* MET for cervical rotation with isometric contraction of antagonist muscle group; x 5 for R and L rotation Attempted C2-3 SNAG with rotation, consistent pain with pressure onto upper cervical facets - discontinued Passive sidebend stretch to R and L with scapular depression; x 30 sec ea dir Suboccipital STM; x 5 minutes  MHP (unbilled) utilized during manual therapy for analgesic effect and improved soft tissue extensibility, x 5 min     AROM C-spine flexion: 45 C-spine extension: 32  C-spine rotation: R 55, L 56   Therapeutic Exercise - for improved soft tissue  flexibility and extensibility as needed for ROM, periscapular isometrics and isotonics to improve postural endurance; upper limb AROM/AAROM as needed for reaching  Self-SNAG with towel for cervical spine rotation; reviewed  Pulleys; x 2 min for shoulder forward elevation AAROM Wall slide with bilat UE c towel; x15, shoulder flexion AAROM  PATIENT EDUCATION: Reviewed HEP and discussed PT POC.    *not today* Standing high row with Red Tband; x15, for eccentric phase AAROM for forward elevation Standing shoulder flexion AAROM with wand; 1x10, back to wall for postural cue Supine shoulder flexion  AAROM with wand; 1x10  -cueing for ensuring minimal pain with ROM with some axillary discomfort at full end-range  Sit to stand; reviewed  Standing HR/TR; 1 x 10 - for HEP review     PATIENT EDUCATION:  Education details: see above for patient education details Person educated: Patient Education method: Explanation, Demonstration, and Handouts Education comprehension: verbalized understanding and returned demonstration   HOME EXERCISE PROGRAM:  Access Code: [email or web address] URL: [email or web address] Date: 08/16/2023 Prepared by: Venetia Endo  Exercises - Seated Upper Trapezius Stretch  - 2 x daily - 7 x weekly - 3 sets - 30sec hold - Seated Levator Scapulae Stretch  - 2 x daily - 7 x weekly - 3 sets - 30sec hold - Seated Scapular Retraction  - 2 x daily - 7 x weekly - 2 sets - 10 reps - 5sec hold - Seated Assisted Cervical Rotation with Towel  - 2 x daily - 7 x weekly - 2 sets - 10 reps - 1sec hold  Access Code: [email or web address] URL: [email or web address] Date: 08/21/2023 Prepared by: Venetia Endo  Exercises - Heel Toe Raises with Counter Support  - 2 x daily - 7 x weekly - 2-3 sets - 10-15 reps - Sit to Stand Without Arm Support  - 2 x daily - 7 x weekly - 2-3 sets - 10 reps - Standing Tandem Balance with Counter Support  - 2 x daily - 7 x weekly - 3 sets -  30sec hold   ASSESSMENT:  CLINICAL IMPRESSION: Patient exhibits markedly improving ROM in all planes of motion. Her rotation to either direction has gotten better significantly, but she is just short of WFL ROM. Pt has notably taut/tender SCM near mastoid process attachment; sensitivity of upper traps is markedly less. Pt has remaining deficits in cervical spine and shoulder AROM, postural dysfunction, taut/tender L>R UT/LS, C-spine stiffness, and dec UE strength. Pt will continue to benefit from skilled PT services to address deficits and improve function.   OBJECTIVE IMPAIRMENTS: decreased ROM, decreased strength, hypomobility, increased muscle spasms, impaired flexibility, impaired UE functional use, postural dysfunction, and pain.   ACTIVITY LIMITATIONS: carrying, lifting, bending, and reach over head  PARTICIPATION LIMITATIONS: meal prep, cleaning, laundry, community activity, and yard work  PERSONAL FACTORS: Age, Past/current experiences, Time since onset of injury/illness/exacerbation, and 3+ comorbidities: (Hx of MI, GERD, OA, chronic lymphocytic leukemia)  are also affecting patient's functional outcome.   REHAB POTENTIAL: Good  CLINICAL DECISION MAKING: Evolving/moderate complexity  EVALUATION COMPLEXITY: Moderate   GOALS: Goals reviewed with patient? Yes  SHORT TERM GOALS: Target date: 08/31/2023  Pt will be independent with HEP to improve strength and decrease neck pain to improve pain-free function at home and work. Baseline: 08/10/23: Baseline HEP initiated.  Goal status: INITIAL   LONG TERM GOALS: Target date: 10/05/2023  Pt will demonstrate cervical spine flexion to 50 deg and bilat rotation to 60 deg or greater as needed for scanning environment and functional ROM Baseline: 08/10/23: Significant motion loss (see chart above) Goal status: INITIAL  2.  Pt will decrease worst neck pain by at least 2 points on the NPRS in order to demonstrate clinically significant  reduction in neck pain. Baseline: 08/10/23:  Goal status: INITIAL  3.  Pt will decrease NDI score by at least 19% in order demonstrate clinically significant reduction in neck pain/disability.       Baseline: 08/10/23: 34% Goal status: INITIAL  4.  Pt will improve BERG by 3 points or greater indicative of clinically meaningful  improvement in pt's balance and fall risk Baseline: 08/10/23: BERG to be completed at visit # 2-3.    08/21/23: 49/56 Goal status: INITIAL   PLAN: PT FREQUENCY: 1-2x/week  PT DURATION: 6 weeks  PLANNED INTERVENTIONS: Therapeutic exercises, Therapeutic activity, Neuromuscular re-education, Balance training, Gait training, Patient/Family education, Self Care, Joint mobilization, Joint manipulation, Vestibular training, Canalith repositioning, Orthotic/Fit training, DME instructions, Dry Needling, Electrical stimulation, Spinal manipulation, Spinal mobilization, Cryotherapy, Moist heat, Taping, Traction, Ultrasound, Ionotophoresis 4mg /ml Dexamethasone, Manual therapy, and Re-evaluation.  PLAN FOR NEXT SESSION: Manual techniques for L>R upper trap, levator scapulae, rhomboid musculature. MET and mobilization techniques to improve C-spine AROM. Update HEP for upper quarter pain or strengthening/balance training prn.   Venetia Endo, PT, DPT #[id number]  Venetia ONEIDA Endo, PT 09/12/2023, 7:41 AM

## 2023-09-14 ENCOUNTER — Ambulatory Visit: Admitting: Physical Therapy

## 2023-09-19 ENCOUNTER — Ambulatory Visit: Attending: Family Medicine | Admitting: Physical Therapy

## 2023-09-19 DIAGNOSIS — M542 Cervicalgia: Secondary | ICD-10-CM | POA: Insufficient documentation

## 2023-09-19 DIAGNOSIS — M62838 Other muscle spasm: Secondary | ICD-10-CM | POA: Diagnosis present

## 2023-09-19 DIAGNOSIS — R2689 Other abnormalities of gait and mobility: Secondary | ICD-10-CM | POA: Diagnosis present

## 2023-09-19 DIAGNOSIS — R262 Difficulty in walking, not elsewhere classified: Secondary | ICD-10-CM | POA: Diagnosis present

## 2023-09-19 NOTE — Therapy (Signed)
 OUTPATIENT PHYSICAL THERAPY TREATMENT   Patient Name: Cheryl Briggs MRN: [id number] DOB:12-25-1945, 78 y.o., female Today's Date: 09/19/2023  END OF SESSION:  PT End of Session - 09/19/23 0843     Visit Number 9    Number of Visits 17    Date for PT Re-Evaluation 10/05/23    PT Start Time 0846    PT Stop Time 0932    PT Time Calculation (min) 46 min    Activity Tolerance Patient tolerated treatment well    Behavior During Therapy [name] for tasks assessed/performed             Past Medical History:  Diagnosis Date   Allergy    Arthritis    Cancer (HCC)    CLL   GERD (gastroesophageal reflux disease)    Myocardial infarction [name])    Past Surgical History:  Procedure Laterality Date   BACK SURGERY     BLADDER REPAIR     CARDIAC CATHETERIZATION     carpel tunnell Bilateral    CHOLECYSTECTOMY     COLONOSCOPY  2015   COLONOSCOPY WITH PROPOFOL  N/A 10/05/2022   Procedure: COLONOSCOPY WITH PROPOFOL ;  Surgeon: Therisa Bi, MD;  Location: [facility] ENDOSCOPY;  Service: Gastroenterology;  Laterality: N/A;   CORONARY STENT INTERVENTION N/A 08/16/2019   Procedure: CORONARY STENT INTERVENTION;  Surgeon: Mady Bruckner, MD;  Location: [facility] INVASIVE CV LAB;  Service: Cardiovascular;  Laterality: N/A;   CORONARY STENT INTERVENTION Left 08/19/2019   Procedure: CORONARY STENT INTERVENTION;  Surgeon: Darron Deatrice LABOR, MD;  Location: [facility] INVASIVE CV LAB;  Service: Cardiovascular;  Laterality: Left;   CORONARY STENT INTERVENTION N/A 02/25/2020   Procedure: CORONARY STENT INTERVENTION;  Surgeon: Mady Bruckner, MD;  Location: [facility] INVASIVE CV LAB;  Service: Cardiovascular;  Laterality: N/A;   LEFT HEART CATH AND CORONARY ANGIOGRAPHY N/A 08/16/2019   Procedure: LEFT HEART CATH AND CORONARY ANGIOGRAPHY;  Surgeon: Mady Bruckner, MD;  Location: [facility] INVASIVE CV LAB;  Service: Cardiovascular;  Laterality: N/A;   LEFT HEART CATH AND CORONARY ANGIOGRAPHY N/A 02/25/2020   Procedure: LEFT HEART  CATH AND CORONARY ANGIOGRAPHY;  Surgeon: Mady Bruckner, MD;  Location: [facility] INVASIVE CV LAB;  Service: Cardiovascular;  Laterality: N/A;   RIGHT/LEFT HEART CATH AND CORONARY ANGIOGRAPHY N/A 06/18/2020   Procedure: RIGHT/LEFT HEART CATH AND CORONARY ANGIOGRAPHY;  Surgeon: Darron Deatrice LABOR, MD;  Location: [facility] INVASIVE CV LAB;  Service: Cardiovascular;  Laterality: N/A;   SHOULDER SURGERY Bilateral    UPPER EXTREMITY ANGIOGRAPHY Right 08/21/2019   Procedure: UPPER EXTREMITY ANGIOGRAPH;  Surgeon: Marea Selinda RAMAN, MD;  Location: [facility] INVASIVE CV LAB;  Service: Cardiovascular;  Laterality: Right;   Patient Active Problem List   Diagnosis Date Noted   Colon cancer screening 10/05/2022   Myocardial infarction (HCC) 08/19/2022   Degenerative joint disease involving multiple joints on both sides of body 05/11/2021   Acute non-ST segment elevation myocardial infarction (HCC) 11/30/2020   Prediabetes 11/30/2020   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   Tachycardia 11/29/2020   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)    Dyspnea    Hyperlipidemia    Gastroesophageal reflux disease without esophagitis    Chest pain 02/25/2020   Neutrophilic leukocytosis 02/25/2020   Essential hypertension    Atrial fibrillation with RVR (HCC) 08/16/2019   NSTEMI (non-ST elevated myocardial infarction) (HCC) 08/15/2019   Hematoma 08/15/2019   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  Atraumatic onset. Pt has referral for neck pain/spasm and balance - no Hx of falls. Pt reports being unbalanced. Pt reports being told by referring provider he thought neck was result of muscle spasm. Hx of L RCR x 3. Patient reports not being as strong in L arm/shoulder. Pt reports difficulty with turning her head. Pt reports she has to hold her head still. L>R-sided neck pain. Pt reports pain/tingling along L suboccipital region with referral in ram's horn pattern around L ear. Pt reports trying to massage neck without relief.   Pain:  Pain Intensity: Present: 5/10, Best: 0/10, Worst: 10/10 Pain location: L SCM/L cervical paraspinal musculature  Pain Quality: aching  Radiating: Yes, around L suboccipital/peri-aural region Numbness/Tingling: Yes; into L suboccipital region, L hand can go numb with holding elbow in flexed position lon enough  Focal Weakness: No Aggravating factors: L rotation worse than R rotation, overhead activity  Relieving factors: Biofreeze, heat, refraining from notable movement 24-hour pain behavior: None  History of prior neck injury, pain, surgery, or therapy: Yes; Right RCR x 1, L RCR x 3; pt given home exercise sheet in past  Dominant hand: right Imaging: No   Red flags (personal history of cancer, h/o spinal tumors, history of compression fracture, chills/fever, night sweats, nausea, vomiting, unrelenting pain):  Hx of chronic lymphcytic leukemia  PRECAUTIONS: None  WEIGHT BEARING RESTRICTIONS: No  FALLS: Has patient fallen in last 6 months? 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   Patient Goals: Help my neck     OBJECTIVE (data from initial evaluation unless otherwise dated):    Patient Surveys  NDI = 17/50 = 34%  Posture L scapula sits higher than R at rest  AROM AROM (Normal range in degrees) AROM 08/10/23  Cervical  Flexion (50) 28*  Extension (80) 30  Right lateral flexion (45) 8  Left lateral flexion (45) 12  Right rotation (85) 20  Left rotation (85) 20  (* = pain; Blank rows = not tested)   Shoulder AROM: Flexion R 130 L 120; ABD 130 bilat; Functional ER: R T2, L C7; Functional IR: R T12, L T12   MMT MMT (out of 5) Right 08/10/23 Left 08/10/23      Shoulder   Flexion 4- 4-  Extension    Abduction 4- 4- (mild pain L arm)  Internal rotation 5 5  External rotation 4- 4-  Horizontal abduction    Horizontal adduction    Lower Trapezius    Rhomboids        Elbow  Flexion 5 5  Extension 4+ 4+  Pronation  Supination        Wrist  Flexion    Extension 5 5  Radial deviation    Ulnar deviation        (* = pain; Blank rows = not tested)  Reflexes R/L Elbow: 2+/1+  Brachioradialis: 2+/2+  Tricep: 2+/1+  Palpation Location LEFT  RIGHT           Suboccipitals    Cervical paraspinals 1 1  Upper Trapezius 2 2  Levator Scapulae 2 2  Rhomboid Major/Minor 2 2  (Blank rows = not tested) Graded on 0-4 scale (0 = no pain, 1 = pain, 2 = pain with wincing/grimacing/flinching, 3 = pain with withdrawal, 4 = unwilling to allow palpation), (Blank rows = not tested)  Repeated Movements Deferred   Passive Accessory Intervertebral Motion Decreased C3-6 R to L sideglide, Decreased C3-5 L to R sideglide.     SPECIAL TESTS Spurlings A (ipsilateral lateral flexion/axial compression): R: Localized neck pain, no upper limb pain/paresthesias,  L: Localized neck pain, no upper limb pain/paresthesias Distraction Test:  Negative  Hoffman Sign (cervical cord compression): R: Not examined L: Not examined ULTT Median: R: Not examined L: Not examined ULTT Ulnar: R: Not examined L: Not examined ULTT Radial: R: Not examined L: Not examined   OUTCOME MEASURES 5TSTS: 10.1 sec TUG: 9.2    BERG: 49/56 (08/21/23)   ____________________________    Clinical Test of Sensory Interaction for Balance    (CTSIB):  CONDITION TIME STRATEGY SWAY  Eyes open, firm surface 30 seconds ankle   Eyes closed, firm surface 30 seconds ankle   Eyes open, foam surface 30 seconds ankle   Eyes closed, foam surface 30 seconds ankle      FUNCTIONAL TASKS  Gait: Normal step cadence, shortened stride length, mild forward flexed posture  Sit to stand: Readily performed with no upper limb support on first attempted, no heavy trunk lean   Stair negotiation: Safe reciprocal stair negotiation, no UE support needed    LE Strength* R/L 4/4- Hip flexion 4+/4+ Hip abduction (seated) 5/5 Hip adduction (seated) 4+/4+ Knee extension 5/5 Knee flexion 4-/4 Ankle Dorsiflexion 4/4 Ankle Plantarflexion *indicates pain     TODAY'S TREATMENT    09/19/2023   SUBJECTIVE STATEMENT:   Patient missed PT last week due to sinus pressure and some associated coughing and dizziness. Patient reports more pain with cold weather. Patient reports pain along L paracervical region and toward insertion of SCM. Pt reports turning her head better to view TV in her group fitness. She reports notable L shouler/upper arm pain with attempted high row exercise, and she wishes to avoid flare-up of L shoulder issue.   OBJECTIVE FINDINGS  AROM Cervical flexion: WNL* (some pain at end-range)  Cervical extension: 50% Lateral flexion: Right 50%* , Left WFL Cervical rotation: Right 75%, Left 75%* *Indicates pain    Manual Therapy - for symptom modulation, soft tissue sensitivity and mobility, joint mobility, ROM   STM/DTM L>R upper trap, levator scapulae, C3-6  splenius cervicis/capitis, L SCM x 15 minutes   -emphasis on L SCM closer to insertion today TPR bilat UT; x 3 minute bouts for each  Gentle passive cervical spine rotation with contralateral anterior facet glide; x 5 ea dir  Sideglide C3-6 bilaterally; 3 x 30 sec bouts  *not today* MET for cervical rotation with isometric contraction of antagonist muscle group; x 5 for R and L rotation Attempted C2-3 SNAG with rotation, consistent pain with pressure onto upper cervical facets -  discontinued Passive sidebend stretch to R and L with scapular depression; x 30 sec ea dir Suboccipital STM; x 5 minutes  MHP (unbilled) utilized during manual therapy for analgesic effect and improved soft tissue extensibility, x 5 min     AROM C-spine rotation: R 56, L 60   Therapeutic Exercise - for improved soft tissue flexibility and extensibility as needed for ROM, periscapular isometrics and isotonics to improve postural endurance; upper limb AROM/AAROM as needed for reaching  Self-SNAG with towel for cervical spine rotation; x10 for L rotation   -technique reviewed, demo and verbal cueing Standing shoulder flexion AAROM with wand; 1x15, back to wall for postural cue  PATIENT EDUCATION: Reviewed HEP and discussed PT POC.    *not today* Wall slide with bilat UE c towel; x15, shoulder flexion AAROM Pulleys; x 2 min for shoulder forward elevation AAROM Standing high row with Red Tband; x15, for eccentric phase AAROM for forward elevation Supine shoulder flexion AAROM with wand; 1x10  -cueing for ensuring minimal pain with ROM with some axillary discomfort at full end-range  Sit to stand; reviewed  Standing HR/TR; 1 x 10 - for HEP review     PATIENT EDUCATION:  Education details: see above for patient education details Person educated: Patient Education method: Explanation, Demonstration, and Handouts Education comprehension: verbalized understanding and returned demonstration   HOME  EXERCISE PROGRAM:  Access Code: [email or web address] URL: [email or web address] Date: 08/16/2023 Prepared by: Venetia Endo  Exercises - Seated Upper Trapezius Stretch  - 2 x daily - 7 x weekly - 3 sets - 30sec hold - Seated Levator Scapulae Stretch  - 2 x daily - 7 x weekly - 3 sets - 30sec hold - Seated Scapular Retraction  - 2 x daily - 7 x weekly - 2 sets - 10 reps - 5sec hold - Seated Assisted Cervical Rotation with Towel  - 2 x daily - 7 x weekly - 2 sets - 10 reps - 1sec hold  Access Code: [email or web address] URL: [email or web address] Date: 08/21/2023 Prepared by: Venetia Endo  Exercises - Heel Toe Raises with Counter Support  - 2 x daily - 7 x weekly - 2-3 sets - 10-15 reps - Sit to Stand Without Arm Support  - 2 x daily - 7 x weekly - 2-3 sets - 10 reps - Standing Tandem Balance with Counter Support  - 2 x daily - 7 x weekly - 3 sets - 30sec hold   ASSESSMENT:  CLINICAL IMPRESSION: Patient was out of PT last week due to upper respiratory issues/sinusitis and associated cough and dizziness per pt. Patient fortunately has not regressed notably with ROM compared to last week; she reports more axial C-spine pain and L SCM pain with colder weather. Pt tolerates manual therapy well today and is able to obtain [facility] R C-spine rotation, L C-spine rotation just short of functional ROM. Pt has remaining deficits in cervical spine and shoulder AROM, postural dysfunction, taut/tender L>R UT/LS, C-spine stiffness, and dec UE strength. Pt will continue to benefit from skilled PT services to address deficits and improve function.   OBJECTIVE IMPAIRMENTS: decreased ROM, decreased strength, hypomobility, increased muscle spasms, impaired flexibility, impaired UE functional use, postural dysfunction, and pain.   ACTIVITY LIMITATIONS: carrying, lifting, bending, and reach over head  PARTICIPATION LIMITATIONS: meal prep, cleaning, laundry, community activity, and yard work  PERSONAL  FACTORS: Age, Past/current experiences, Time since onset of injury/illness/exacerbation, and 3+ comorbidities: (Hx of MI, GERD, OA, chronic lymphocytic leukemia)  are also affecting patient's functional  outcome.   REHAB POTENTIAL: Good  CLINICAL DECISION MAKING: Evolving/moderate complexity  EVALUATION COMPLEXITY: Moderate   GOALS: Goals reviewed with patient? Yes  SHORT TERM GOALS: Target date: 08/31/2023  Pt will be independent with HEP to improve strength and decrease neck pain to improve pain-free function at home and work. Baseline: 08/10/23: Baseline HEP initiated.  Goal status: INITIAL   LONG TERM GOALS: Target date: 10/05/2023  Pt will demonstrate cervical spine flexion to 50 deg and bilat rotation to 60 deg or greater as needed for scanning environment and functional ROM Baseline: 08/10/23: Significant motion loss (see chart above) Goal status: INITIAL  2.  Pt will decrease worst neck pain by at least 2 points on the NPRS in order to demonstrate clinically significant reduction in neck pain. Baseline: 08/10/23:  Goal status: INITIAL  3.  Pt will decrease NDI score by at least 19% in order demonstrate clinically significant reduction in neck pain/disability.       Baseline: 08/10/23: 34% Goal status: INITIAL  4.  Pt will improve BERG by 3 points or greater indicative of clinically meaningful improvement in pt's balance and fall risk Baseline: 08/10/23: BERG to be completed at visit # 2-3.    08/21/23: 49/56 Goal status: INITIAL   PLAN: PT FREQUENCY: 1-2x/week  PT DURATION: 6 weeks  PLANNED INTERVENTIONS: Therapeutic exercises, Therapeutic activity, Neuromuscular re-education, Balance training, Gait training, Patient/Family education, Self Care, Joint mobilization, Joint manipulation, Vestibular training, Canalith repositioning, Orthotic/Fit training, DME instructions, Dry Needling, Electrical stimulation, Spinal manipulation, Spinal mobilization, Cryotherapy, Moist heat,  Taping, Traction, Ultrasound, Ionotophoresis 4mg /ml Dexamethasone, Manual therapy, and Re-evaluation.  PLAN FOR NEXT SESSION: Manual techniques for L>R upper trap, levator scapulae, rhomboid musculature. MET and mobilization techniques to improve C-spine AROM. Update HEP for upper quarter pain or strengthening/balance training prn.   Venetia Endo, PT, DPT #[id number]  Venetia ONEIDA Endo, PT 09/19/2023, 9:40 AM

## 2023-09-25 ENCOUNTER — Ambulatory Visit: Admitting: Physical Therapy

## 2023-09-25 DIAGNOSIS — M542 Cervicalgia: Secondary | ICD-10-CM | POA: Diagnosis not present

## 2023-09-25 DIAGNOSIS — M62838 Other muscle spasm: Secondary | ICD-10-CM

## 2023-09-25 DIAGNOSIS — R2689 Other abnormalities of gait and mobility: Secondary | ICD-10-CM

## 2023-09-25 DIAGNOSIS — R262 Difficulty in walking, not elsewhere classified: Secondary | ICD-10-CM

## 2023-09-25 NOTE — Therapy (Unsigned)
 OUTPATIENT PHYSICAL THERAPY TREATMENT AND PROGRESS NOTE/RE-CERTIFICATION   Dates of reporting period  08/10/23   to   09/25/23   Patient Name: Cheryl Briggs MRN: [id number] DOB:[birth year], 78 y.o., female Today's Date: 09/25/2023  END OF SESSION:  PT End of Session - 09/25/23 1036     Visit Number 10    Number of Visits 17    Date for PT Re-Evaluation 10/05/23    PT Start Time 1035    PT Stop Time 1114    PT Time Calculation (min) 39 min    Activity Tolerance Patient tolerated treatment well    Behavior During Therapy WFL for tasks assessed/performed          Past Medical History:  Diagnosis Date   Allergy    Arthritis    Cancer (HCC)    CLL   GERD (gastroesophageal reflux disease)    Myocardial infarction [facility])    Past Surgical History:  Procedure Laterality Date   BACK SURGERY     BLADDER REPAIR     CARDIAC CATHETERIZATION     carpel tunnell Bilateral    CHOLECYSTECTOMY     COLONOSCOPY  2015   COLONOSCOPY WITH PROPOFOL  N/A 10/05/2022   Procedure: COLONOSCOPY WITH PROPOFOL ;  Surgeon: Therisa Bi, MD;  Location: [facility] ENDOSCOPY;  Service: Gastroenterology;  Laterality: N/A;   CORONARY STENT INTERVENTION N/A 08/16/2019   Procedure: CORONARY STENT INTERVENTION;  Surgeon: Mady Bruckner, MD;  Location: [facility] INVASIVE CV LAB;  Service: Cardiovascular;  Laterality: N/A;   CORONARY STENT INTERVENTION Left 08/19/2019   Procedure: CORONARY STENT INTERVENTION;  Surgeon: Darron Deatrice LABOR, MD;  Location: [facility] INVASIVE CV LAB;  Service: Cardiovascular;  Laterality: Left;   CORONARY STENT INTERVENTION N/A 02/25/2020   Procedure: CORONARY STENT INTERVENTION;  Surgeon: Mady Bruckner, MD;  Location: [facility] INVASIVE CV LAB;  Service: Cardiovascular;  Laterality: N/A;   LEFT HEART CATH AND CORONARY ANGIOGRAPHY N/A 08/16/2019   Procedure: LEFT HEART CATH AND CORONARY ANGIOGRAPHY;  Surgeon: Mady Bruckner, MD;  Location: [facility] INVASIVE CV LAB;  Service: Cardiovascular;  Laterality: N/A;    LEFT HEART CATH AND CORONARY ANGIOGRAPHY N/A 02/25/2020   Procedure: LEFT HEART CATH AND CORONARY ANGIOGRAPHY;  Surgeon: Mady Bruckner, MD;  Location: [facility] INVASIVE CV LAB;  Service: Cardiovascular;  Laterality: N/A;   RIGHT/LEFT HEART CATH AND CORONARY ANGIOGRAPHY N/A 06/18/2020   Procedure: RIGHT/LEFT HEART CATH AND CORONARY ANGIOGRAPHY;  Surgeon: Darron Deatrice LABOR, MD;  Location: [facility] INVASIVE CV LAB;  Service: Cardiovascular;  Laterality: N/A;   SHOULDER SURGERY Bilateral    UPPER EXTREMITY ANGIOGRAPHY Right 08/21/2019   Procedure: UPPER EXTREMITY ANGIOGRAPH;  Surgeon: Marea Selinda RAMAN, MD;  Location: [facility] INVASIVE CV LAB;  Service: Cardiovascular;  Laterality: Right;   Patient Active Problem List   Diagnosis Date Noted   Colon cancer screening 10/05/2022   Myocardial infarction (HCC) 08/19/2022   Degenerative joint disease involving multiple joints on both sides of body 05/11/2021   Acute non-ST segment elevation myocardial infarction [facility]) 11/30/2020   Prediabetes 11/30/2020   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   Tachycardia 11/29/2020   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)    Dyspnea    Hyperlipidemia    Gastroesophageal reflux disease without esophagitis    Chest pain 02/25/2020   Neutrophilic leukocytosis 02/25/2020   Essential hypertension    Atrial fibrillation with RVR (HCC) 08/16/2019   NSTEMI (non-ST elevated myocardial infarction) (HCC) 08/15/2019   Hematoma 08/15/2019   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 Atraumatic onset. Pt has referral for neck pain/spasm and balance - no Hx of falls. Pt reports being unbalanced. Pt reports being told by referring provider he thought neck was result of muscle spasm. Hx of L RCR x 3. Patient reports not being as strong in L arm/shoulder. Pt reports difficulty with turning her head. Pt reports she has to hold her head still. L>R-sided neck pain. Pt reports pain/tingling along L suboccipital region with referral in ram's horn pattern around L ear. Pt reports trying to massage neck without relief.   Pain:  Pain Intensity: Present: 5/10, Best: 0/10, Worst: 10/10 Pain location: L SCM/L cervical paraspinal musculature  Pain Quality: aching  Radiating: Yes, around L suboccipital/peri-aural region Numbness/Tingling: Yes; into L suboccipital region, L hand can go numb with holding elbow in flexed position lon enough  Focal Weakness: No Aggravating factors: L rotation worse than R rotation, overhead activity  Relieving factors: Biofreeze, heat, refraining from notable movement 24-hour pain behavior: None  History of prior neck injury, pain, surgery, or therapy: Yes; Right RCR x 1, L RCR x 3; pt given home exercise sheet in past  Dominant hand: right Imaging: No   Red flags (personal history of cancer, h/o spinal tumors, history of compression fracture, chills/fever, night sweats, nausea, vomiting, unrelenting pain):  Hx of chronic lymphcytic leukemia  PRECAUTIONS: None  WEIGHT BEARING RESTRICTIONS: No  FALLS: Has patient fallen in last 6 months? 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   Patient Goals: Help my neck     OBJECTIVE (data from initial evaluation unless otherwise dated):    Patient Surveys  NDI = 17/50 = 34%  Posture L scapula sits higher than R at rest  AROM AROM (Normal range in degrees) AROM 08/10/23 AROM 09/25/23  Cervical   Flexion (50) 28* 52  Extension (80) 30 32  Right lateral flexion (45) 8 41  Left lateral flexion (45) 12 41  Right rotation (85) 20 57  Left rotation (85) 20 55  (* = pain; Blank rows = not tested)   Shoulder AROM: Flexion R 130 L 120; ABD 130 bilat; Functional ER: R T2, L C7; Functional IR: R T12, L T12   MMT MMT (out of 5) Right 08/10/23 Left 08/10/23      Shoulder   Flexion 4- 4-  Extension    Abduction 4- 4- (mild pain L arm)  Internal rotation 5 5  External rotation 4- 4-  Horizontal abduction    Horizontal adduction    Lower Trapezius  Rhomboids        Elbow  Flexion 5 5  Extension 4+ 4+  Pronation    Supination        Wrist  Flexion    Extension 5 5  Radial deviation    Ulnar deviation        (* = pain; Blank rows = not tested)  Reflexes R/L Elbow: 2+/1+  Brachioradialis: 2+/2+  Tricep: 2+/1+  Palpation Location LEFT  RIGHT           Suboccipitals    Cervical paraspinals 1 1  Upper Trapezius 2 2  Levator Scapulae 2 2  Rhomboid Major/Minor 2 2  (Blank rows = not tested) Graded on 0-4 scale (0 = no pain, 1 = pain, 2 = pain with wincing/grimacing/flinching, 3 = pain with withdrawal, 4 = unwilling to allow palpation), (Blank rows = not tested)  Repeated Movements Deferred   Passive Accessory Intervertebral Motion Decreased C3-6 R to L sideglide, Decreased C3-5 L to R sideglide.     SPECIAL TESTS Spurlings A (ipsilateral lateral flexion/axial compression): R: Localized neck pain, no upper limb  pain/paresthesias,  L: Localized neck pain, no upper limb pain/paresthesias Distraction Test: Negative  Hoffman Sign (cervical cord compression): R: Not examined L: Not examined ULTT Median: R: Not examined L: Not examined ULTT Ulnar: R: Not examined L: Not examined ULTT Radial: R: Not examined L: Not examined   OUTCOME MEASURES 5TSTS: 10.1 sec TUG: 9.2    BERG: 49/56 (08/21/23)   ____________________________    Clinical Test of Sensory Interaction for Balance    (CTSIB):  CONDITION TIME STRATEGY SWAY  Eyes open, firm surface 30 seconds ankle   Eyes closed, firm surface 30 seconds ankle   Eyes open, foam surface 30 seconds ankle   Eyes closed, foam surface 30 seconds ankle      FUNCTIONAL TASKS  Gait: Normal step cadence, shortened stride length, mild forward flexed posture  Sit to stand: Readily performed with no upper limb support on first attempted, no heavy trunk lean   Stair negotiation: Safe reciprocal stair negotiation, no UE support needed    LE Strength* R/L 4/4- Hip flexion 4+/4+ Hip abduction (seated) 5/5 Hip adduction (seated) 4+/4+ Knee extension 5/5 Knee flexion 4-/4 Ankle Dorsiflexion 4/4 Ankle Plantarflexion *indicates pain     TODAY'S TREATMENT    09/25/2023   SUBJECTIVE STATEMENT:   Patient reports having difficulty tolerating vibration and turns on train this past weekend when [place] Patient reports one spot along L paracervical region is bothering her this AM; she feels that train ride irritate it. Patient reports 5-6/10 pain at arrival to PT. Pt has comorbid L shoulder pain with attempting to help her daughter move dog that had died on property.     *GOAL UPDATE PERFORMED   Manual Therapy - for symptom modulation, soft tissue sensitivity and mobility, joint mobility, ROM   STM/DTM L>R upper trap, levator scapulae, C3-6 splenius cervicis/capitis, L SCM x 10 minutes   -emphasis on L SCM  closer to insertion today TPR bilat UT; x 3 minute bouts for each   Sideglide C3-6 bilaterally; 3 x 30 sec bouts  *not today* Gentle passive cervical spine rotation with contralateral anterior facet glide; x 5 ea dir MET for cervical rotation with isometric contraction of antagonist muscle group; x 5 for R and L rotation Attempted C2-3 SNAG with rotation, consistent pain with pressure onto upper cervical facets - discontinued Passive sidebend stretch to  R and L with scapular depression; x 30 sec ea dir Suboccipital STM; x 5 minutes  MHP (unbilled) utilized during manual therapy for analgesic effect and improved soft tissue extensibility, x 5 min     Therapeutic Exercise - for improved soft tissue flexibility and extensibility as needed for ROM, periscapular isometrics and isotonics to improve postural endurance; upper limb AROM/AAROM as needed for reaching  Self-SNAG with towel for cervical spine rotation; x10 for L rotation   -technique reviewed, demo and verbal cueing  PATIENT EDUCATION: Reviewed HEP and discussed current progress and PT POC.    *not today* Standing shoulder flexion AAROM with wand; 1x15, back to wall for postural cue Wall slide with bilat UE c towel; x15, shoulder flexion AAROM Pulleys; x 2 min for shoulder forward elevation AAROM Standing high row with Red Tband; x15, for eccentric phase AAROM for forward elevation Supine shoulder flexion AAROM with wand; 1x10  -cueing for ensuring minimal pain with ROM with some axillary discomfort at full end-range Sit to stand; reviewed  Standing HR/TR; 1 x 10 - for HEP review     Neuromuscular Re-education - for improved sensory integration, static and dynamic postural control, equilibrium and non-equilibrium coordination as needed for negotiating home and community environment and stepping over obstacles   BERG performed for goal [facility] PT Assessment - 09/25/23 0001       Berg Balance Test   Sit to Stand  Able to stand without using hands and stabilize independently    Standing Unsupported Able to stand safely 2 minutes    Sitting with Back Unsupported but Feet Supported on Floor or Stool Able to sit safely and securely 2 minutes    Stand to Sit Sits safely with minimal use of hands    Transfers Able to transfer safely, minor use of hands    Standing Unsupported with Eyes Closed Able to stand 10 seconds safely    Standing Unsupported with Feet Together Able to place feet together independently and stand 1 minute safely    From Standing, Reach Forward with Outstretched Arm Can reach confidently >25 cm (10)    From Standing Position, Pick up Object from Floor Able to pick up shoe safely and easily    From Standing Position, Turn to Look Behind Over each Shoulder Looks behind from both sides and weight shifts well    Turn 360 Degrees Able to turn 360 degrees safely in 4 seconds or less    Standing Unsupported, Alternately Place Feet on Step/Stool Able to stand independently and safely and complete 8 steps in 20 seconds    Standing Unsupported, One Foot in Front Able to plae foot ahead of the other independently and hold 30 seconds    Standing on One Leg Able to lift leg independently and hold equal to or more than 3 seconds    Total Score 53            PATIENT EDUCATION:  Education details: see above for patient education details Person educated: Patient Education method: Explanation, Demonstration, and Handouts Education comprehension: verbalized understanding and returned demonstration   HOME EXERCISE PROGRAM:  Access Code: [email or web address] URL: [email or web address] Date: 08/16/2023 Prepared by: Venetia Endo  Exercises - Seated Upper Trapezius Stretch  - 2 x daily - 7 x weekly - 3 sets - 30sec hold - Seated Levator Scapulae Stretch  - 2 x daily - 7 x weekly - 3 sets - 30sec hold - Seated Scapular Retraction  - 2 x daily -  7 x weekly - 2 sets - 10 reps - 5sec hold -  Seated Assisted Cervical Rotation with Towel  - 2 x daily - 7 x weekly - 2 sets - 10 reps - 1sec hold  Access Code: [email or web address] URL: [email or web address] Date: 08/21/2023 Prepared by: Venetia Endo  Exercises - Heel Toe Raises with Counter Support  - 2 x daily - 7 x weekly - 2-3 sets - 10-15 reps - Sit to Stand Without Arm Support  - 2 x daily - 7 x weekly - 2-3 sets - 10 reps - Standing Tandem Balance with Counter Support  - 2 x daily - 7 x weekly - 3 sets - 30sec hold   ASSESSMENT:  CLINICAL IMPRESSION: Patient exhibits WFL sagittal plane AROM for cervical spine; rotation is mildly limited with R and L rotation being just short of 60 deg. She is significantly limited with functional use of L>R upper limb with Hx of multiple RCRs and ongoing pain/mobility deficits in L more than R shoulder. Tolerance of overhead exercise is limited due to shoulder condition. Pt fortunately has decreased NDI, but she has not yet met long-term goal for this outcome measure. Pain on average is 5/10, but pt has experienced 10/10 symptoms during recent mountain trip. She has excellent BERG score and significant improvement from baseline measure, highlighting decreased risk of falling and low risk per current BERG score. Pt has remaining deficits in cervical spine and shoulder AROM, postural dysfunction, taut/tender L>R UT/LS, C-spine stiffness, and dec UE strength. Pt will continue to benefit from skilled PT services to address deficits and improve function.   OBJECTIVE IMPAIRMENTS: decreased ROM, decreased strength, hypomobility, increased muscle spasms, impaired flexibility, impaired UE functional use, postural dysfunction, and pain.   ACTIVITY LIMITATIONS: carrying, lifting, bending, and reach over head  PARTICIPATION LIMITATIONS: meal prep, cleaning, laundry, community activity, and yard work  PERSONAL FACTORS: Age, Past/current experiences, Time since onset of injury/illness/exacerbation, and  3+ comorbidities: (Hx of MI, GERD, OA, chronic lymphocytic leukemia)  are also affecting patient's functional outcome.   REHAB POTENTIAL: Good  CLINICAL DECISION MAKING: Evolving/moderate complexity  EVALUATION COMPLEXITY: Moderate   GOALS: Goals reviewed with patient? Yes  SHORT TERM GOALS: Target date: 08/31/2023  Pt will be independent with HEP to improve strength and decrease neck pain to improve pain-free function at home and work. Baseline: 08/10/23: Baseline HEP initiated.       09/25/23: Pt is keeping up senior group fitness classes; pt is not compliant with prescribed exercises as given on printout.  Goal status: NOT MET    LONG TERM GOALS: Target date: 10/05/2023  Pt will demonstrate cervical spine flexion to 50 deg and bilat rotation to 60 deg or greater as needed for scanning environment and functional ROM Baseline: 08/10/23: Significant motion loss (see chart above).       09/25/23: Met for flexion, significant progress for bilat rotation with pt within 3-5 deg of goal.  Goal status: IN PROGRESS  2.  Pt will decrease worst neck pain by at least 2 points on the NPRS in order to demonstrate clinically significant reduction in neck pain. Baseline: 08/10/23: 10/10 at worst.    09/25/23: 10/10 at worst, 5/10 on average Goal status: NOT MET   3.  Pt will decrease NDI score by at least 19% in order demonstrate clinically significant reduction in neck pain/disability.       Baseline: 08/10/23: 34%.    09/25/23: 10/50 = 20% Goal status: IN PROGRESS  4.  Pt  will improve BERG by 3 points or greater indicative of clinically meaningful improvement in pt's balance and fall risk Baseline: 08/10/23: BERG to be completed at visit # 2-3.    08/21/23: 49/56.    09/25/23: 53/56 Goal status: ACHIEVED    PLAN: PT FREQUENCY: 1-2x/week  PT DURATION: 3-4 weeks  PLANNED INTERVENTIONS: Therapeutic exercises, Therapeutic activity, Neuromuscular re-education, Balance training, Gait training,  Patient/Family education, Self Care, Joint mobilization, Joint manipulation, Vestibular training, Canalith repositioning, Orthotic/Fit training, DME instructions, Dry Needling, Electrical stimulation, Spinal manipulation, Spinal mobilization, Cryotherapy, Moist heat, Taping, Traction, Ultrasound, Ionotophoresis 4mg /ml Dexamethasone, Manual therapy, and Re-evaluation.  PLAN FOR NEXT SESSION: Manual techniques for L>R upper trap, levator scapulae, rhomboid musculature. MET and mobilization techniques to improve C-spine AROM. Update HEP for upper quarter pain or strengthening/balance training prn.    Venetia Endo, PT, DPT #[id number]  Venetia ONEIDA Endo, PT 09/25/2023, 11:02 AM

## 2023-09-27 ENCOUNTER — Encounter: Payer: Self-pay | Admitting: Physical Therapy

## 2023-10-03 ENCOUNTER — Ambulatory Visit: Admitting: Physical Therapy

## 2023-10-03 ENCOUNTER — Encounter: Payer: Self-pay | Admitting: Physical Therapy

## 2023-10-03 DIAGNOSIS — R262 Difficulty in walking, not elsewhere classified: Secondary | ICD-10-CM

## 2023-10-03 DIAGNOSIS — M542 Cervicalgia: Secondary | ICD-10-CM

## 2023-10-03 DIAGNOSIS — M62838 Other muscle spasm: Secondary | ICD-10-CM

## 2023-10-03 DIAGNOSIS — R2689 Other abnormalities of gait and mobility: Secondary | ICD-10-CM

## 2023-10-03 NOTE — Therapy (Unsigned)
 OUTPATIENT PHYSICAL THERAPY TREATMENT    Patient Name: Cheryl Briggs MRN: [id number] DOB:1945/04/23, 78 y.o., female Today's Date: 10/03/2023  END OF SESSION:  PT End of Session - 10/03/23 1513     Visit Number 11    Number of Visits 17    Date for Recertification  10/05/23    PT Start Time 1515    PT Stop Time 1554    PT Time Calculation (min) 39 min    Activity Tolerance Patient tolerated treatment well    Behavior During Therapy WFL for tasks assessed/performed          Past Medical History:  Diagnosis Date   Allergy    Arthritis    Cancer (HCC)    CLL   GERD (gastroesophageal reflux disease)    Myocardial infarction [facility])    Past Surgical History:  Procedure Laterality Date   BACK SURGERY     BLADDER REPAIR     CARDIAC CATHETERIZATION     carpel tunnell Bilateral    CHOLECYSTECTOMY     COLONOSCOPY  2015   COLONOSCOPY WITH PROPOFOL  N/A 10/05/2022   Procedure: COLONOSCOPY WITH PROPOFOL ;  Surgeon: Therisa Bi, MD;  Location: [facility] ENDOSCOPY;  Service: Gastroenterology;  Laterality: N/A;   CORONARY STENT INTERVENTION N/A 08/16/2019   Procedure: CORONARY STENT INTERVENTION;  Surgeon: Mady Bruckner, MD;  Location: [facility] INVASIVE CV LAB;  Service: Cardiovascular;  Laterality: N/A;   CORONARY STENT INTERVENTION Left 08/19/2019   Procedure: CORONARY STENT INTERVENTION;  Surgeon: Darron Deatrice LABOR, MD;  Location: [facility] INVASIVE CV LAB;  Service: Cardiovascular;  Laterality: Left;   CORONARY STENT INTERVENTION N/A 02/25/2020   Procedure: CORONARY STENT INTERVENTION;  Surgeon: Mady Bruckner, MD;  Location: [facility] INVASIVE CV LAB;  Service: Cardiovascular;  Laterality: N/A;   LEFT HEART CATH AND CORONARY ANGIOGRAPHY N/A 08/16/2019   Procedure: LEFT HEART CATH AND CORONARY ANGIOGRAPHY;  Surgeon: Mady Bruckner, MD;  Location: [facility] INVASIVE CV LAB;  Service: Cardiovascular;  Laterality: N/A;   LEFT HEART CATH AND CORONARY ANGIOGRAPHY N/A 02/25/2020   Procedure: LEFT HEART CATH  AND CORONARY ANGIOGRAPHY;  Surgeon: Mady Bruckner, MD;  Location: [facility] INVASIVE CV LAB;  Service: Cardiovascular;  Laterality: N/A;   RIGHT/LEFT HEART CATH AND CORONARY ANGIOGRAPHY N/A 06/18/2020   Procedure: RIGHT/LEFT HEART CATH AND CORONARY ANGIOGRAPHY;  Surgeon: Darron Deatrice LABOR, MD;  Location: [facility] INVASIVE CV LAB;  Service: Cardiovascular;  Laterality: N/A;   SHOULDER SURGERY Bilateral    UPPER EXTREMITY ANGIOGRAPHY Right 08/21/2019   Procedure: UPPER EXTREMITY ANGIOGRAPH;  Surgeon: Marea Selinda RAMAN, MD;  Location: [facility] INVASIVE CV LAB;  Service: Cardiovascular;  Laterality: Right;   Patient Active Problem List   Diagnosis Date Noted   Colon cancer screening 10/05/2022   Myocardial infarction (HCC) 08/19/2022   Degenerative joint disease involving multiple joints on both sides of body 05/11/2021   Acute non-ST segment elevation myocardial infarction (HCC) 11/30/2020   Prediabetes 11/30/2020   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   Tachycardia 11/29/2020   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)    Dyspnea    Hyperlipidemia    Gastroesophageal reflux disease without esophagitis    Chest pain 02/25/2020   Neutrophilic leukocytosis 02/25/2020   Essential hypertension    Atrial fibrillation with RVR (HCC) 08/16/2019   NSTEMI (non-ST elevated myocardial infarction) (HCC) 08/15/2019   Hematoma 08/15/2019   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 Atraumatic  onset. Pt has referral for neck pain/spasm and balance - no Hx of falls. Pt reports being unbalanced. Pt reports being told by referring provider he thought neck was result of muscle spasm. Hx of L RCR x 3. Patient reports not being as strong in L arm/shoulder. Pt reports difficulty with turning her head. Pt reports she has to hold her head still. L>R-sided neck pain. Pt reports pain/tingling along L suboccipital region with referral in ram's horn pattern around L ear. Pt reports trying to massage neck without relief.   Pain:  Pain Intensity: Present: 5/10, Best: 0/10, Worst: 10/10 Pain location: L SCM/L cervical paraspinal musculature  Pain Quality: aching  Radiating: Yes, around L suboccipital/peri-aural region Numbness/Tingling: Yes; into L suboccipital region, L hand can go numb with holding elbow in flexed position lon enough  Focal Weakness: No Aggravating factors: L rotation worse than R rotation, overhead activity  Relieving factors: Biofreeze, heat, refraining from notable movement 24-hour pain behavior: None  History of prior neck injury, pain, surgery, or therapy: Yes; Right RCR x 1, L RCR x 3; pt given home exercise sheet in past  Dominant hand: right Imaging: No   Red flags (personal history of cancer, h/o spinal tumors, history of compression fracture, chills/fever, night sweats, nausea, vomiting, unrelenting pain):  Hx of chronic lymphcytic leukemia  PRECAUTIONS: None  WEIGHT BEARING RESTRICTIONS: No  FALLS: Has patient fallen in last 6 months? 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   Patient Goals: Help my neck     OBJECTIVE (data from initial evaluation unless otherwise dated):    Patient Surveys  NDI = 17/50 = 34%  Posture L scapula sits higher than R at rest  AROM AROM (Normal range in degrees) AROM 08/10/23 AROM 09/25/23  Cervical   Flexion (50) 28* 52  Extension (80) 30 32  Right lateral flexion (45) 8 41  Left lateral flexion (45) 12 41  Right rotation (85) 20 57  Left rotation (85) 20 55  (* = pain; Blank rows = not tested)   Shoulder AROM: Flexion R 130 L 120; ABD 130 bilat; Functional ER: R T2, L C7; Functional IR: R T12, L T12   MMT MMT (out of 5) Right 08/10/23 Left 08/10/23      Shoulder   Flexion 4- 4-  Extension    Abduction 4- 4- (mild pain L arm)  Internal rotation 5 5  External rotation 4- 4-  Horizontal abduction    Horizontal adduction    Lower Trapezius    Rhomboids        Elbow  Flexion 5 5  Extension 4+ 4+  Pronation    Supination        Wrist  Flexion    Extension 5 5  Radial deviation    Ulnar deviation        (* = pain; Blank rows = not tested)  Reflexes R/L Elbow: 2+/1+  Brachioradialis: 2+/2+  Tricep: 2+/1+  Palpation Location LEFT  RIGHT           Suboccipitals    Cervical paraspinals 1 1  Upper Trapezius 2 2  Levator Scapulae 2 2  Rhomboid Major/Minor 2 2  (Blank rows = not tested) Graded on 0-4 scale (0 = no pain, 1 = pain, 2 = pain with wincing/grimacing/flinching, 3 = pain with withdrawal, 4 = unwilling to allow palpation), (Blank rows = not tested)  Repeated Movements Deferred   Passive Accessory Intervertebral Motion Decreased C3-6 R to L sideglide, Decreased C3-5 L to R sideglide.     SPECIAL TESTS Spurlings A (ipsilateral lateral flexion/axial compression): R: Localized neck pain, no upper limb pain/paresthesias,  L: Localized neck pain, no upper limb pain/paresthesias Distraction  Test: Negative  Hoffman Sign (cervical cord compression): R: Not examined L: Not examined ULTT Median: R: Not examined L: Not examined ULTT Ulnar: R: Not examined L: Not examined ULTT Radial: R: Not examined L: Not examined   OUTCOME MEASURES 5TSTS: 10.1 sec TUG: 9.2    BERG: 49/56 (08/21/23)   ____________________________    Clinical Test of Sensory Interaction for Balance    (CTSIB):  CONDITION TIME STRATEGY SWAY  Eyes open, firm surface 30 seconds ankle   Eyes closed, firm surface 30 seconds ankle   Eyes open, foam surface 30 seconds ankle   Eyes closed, foam surface 30 seconds ankle      FUNCTIONAL TASKS  Gait: Normal step cadence, shortened stride length, mild forward flexed posture  Sit to stand: Readily performed with no upper limb support on first attempted, no heavy trunk lean   Stair negotiation: Safe reciprocal stair negotiation, no UE support needed    LE Strength* R/L 4/4- Hip flexion 4+/4+ Hip abduction (seated) 5/5 Hip adduction (seated) 4+/4+ Knee extension 5/5 Knee flexion 4-/4 Ankle Dorsiflexion 4/4 Ankle Plantarflexion *indicates pain     TODAY'S TREATMENT    10/03/2023   SUBJECTIVE STATEMENT:   Patient reports L paracervical pain primarily; she states it wasn't bad yesterday. Pt reports that sometimes she wakes up and feels that her ROM is doing well. She reports 8/10 pain this afternoon. Patient reports compliance with her HEP.     Manual Therapy - for symptom modulation, soft tissue sensitivity and mobility, joint mobility, ROM   STM/DTM L>R upper trap, levator scapulae, C3-6 splenius cervicis/capitis, L SCM x 15 minutes   -emphasis on L SCM closer to insertion today TPR bilat UT; x 3 minute bouts for each   Sideglide C3-6 bilaterally; 3 x 30 sec bouts  Passive sidebend stretch to R and L with scapular depression; x 30 sec ea dir   *not today* Gentle passive cervical spine rotation with contralateral anterior facet glide; x 5 ea  dir MET for cervical rotation with isometric contraction of antagonist muscle group; x 5 for R and L rotation Attempted C2-3 SNAG with rotation, consistent pain with pressure onto upper cervical facets - discontinued Suboccipital STM; x 5 minutes  MHP (unbilled) utilized during manual therapy for analgesic effect and improved soft tissue extensibility, x 5 min     Therapeutic Exercise - for improved soft tissue flexibility  and extensibility as needed for ROM, periscapular isometrics and isotonics to improve postural endurance; upper limb AROM/AAROM as needed for reaching  Self-SNAG with towel for cervical spine rotation; reviewed  PATIENT EDUCATION: Reviewed HEP and discussed expectations moving forward with PT/POC.    *not today* Standing shoulder flexion AAROM with wand; 1x15, back to wall for postural cue Wall slide with bilat UE c towel; x15, shoulder flexion AAROM Pulleys; x 2 min for shoulder forward elevation AAROM Standing high row with Red Tband; x15, for eccentric phase AAROM for forward elevation Supine shoulder flexion AAROM with wand; 1x10  -cueing for ensuring minimal pain with ROM with some axillary discomfort at full end-range Sit to stand; reviewed  Standing HR/TR; 1 x 10 - for HEP review        PATIENT EDUCATION:  Education details: see above for patient education details Person educated: Patient Education method: Explanation, Demonstration, and Handouts Education comprehension: verbalized understanding and returned demonstration   HOME EXERCISE PROGRAM:  Access Code: [email or web address] URL: [email or web address] Date: 08/16/2023 Prepared by: Venetia Endo  Exercises - Seated Upper Trapezius Stretch  - 2 x daily - 7 x weekly - 3 sets - 30sec hold - Seated Levator Scapulae Stretch  - 2 x daily - 7 x weekly - 3 sets - 30sec hold - Seated Scapular Retraction  - 2 x daily - 7 x weekly - 2 sets - 10 reps - 5sec hold - Seated Assisted Cervical  Rotation with Towel  - 2 x daily - 7 x weekly - 2 sets - 10 reps - 1sec hold  Access Code: [email or web address] URL: [email or web address] Date: 08/21/2023 Prepared by: Venetia Endo  Exercises - Heel Toe Raises with Counter Support  - 2 x daily - 7 x weekly - 2-3 sets - 10-15 reps - Sit to Stand Without Arm Support  - 2 x daily - 7 x weekly - 2-3 sets - 10 reps - Standing Tandem Balance with Counter Support  - 2 x daily - 7 x weekly - 3 sets - 30sec hold   ASSESSMENT:  CLINICAL IMPRESSION: Patient has markedly improved overall with ROM, but she has ongoing pain affecting L paracervical region with most pain on superior substance of SCM. Pt has notable sensitivity along this region and L>R upper trap during manual therapy today. We did not complete therapeutic exercise today due to extensive time spent on manual therapy/symptom modulation. We will resume mobility work and improving upper trap/scapular hike compensation next visit. Pt has remaining deficits in cervical spine and shoulder AROM, postural dysfunction, taut/tender L>R UT/LS, C-spine stiffness, and dec UE strength. Pt will continue to benefit from skilled PT services to address deficits and improve function.   OBJECTIVE IMPAIRMENTS: decreased ROM, decreased strength, hypomobility, increased muscle spasms, impaired flexibility, impaired UE functional use, postural dysfunction, and pain.   ACTIVITY LIMITATIONS: carrying, lifting, bending, and reach over head  PARTICIPATION LIMITATIONS: meal prep, cleaning, laundry, community activity, and yard work  PERSONAL FACTORS: Age, Past/current experiences, Time since onset of injury/illness/exacerbation, and 3+ comorbidities: (Hx of MI, GERD, OA, chronic lymphocytic leukemia)  are also affecting patient's functional outcome.   REHAB POTENTIAL: Good  CLINICAL DECISION MAKING: Evolving/moderate complexity  EVALUATION COMPLEXITY: Moderate   GOALS: Goals reviewed with patient?  Yes  SHORT TERM GOALS: Target date: 08/31/2023  Pt will be independent with HEP to improve strength and decrease neck pain to improve pain-free function at home and work. Baseline: 08/10/23: Baseline HEP initiated.       09/25/23:  Pt is keeping up senior group fitness classes; pt is not compliant with prescribed exercises as given on printout.  Goal status: NOT MET    LONG TERM GOALS: Target date: 10/05/2023  Pt will demonstrate cervical spine flexion to 50 deg and bilat rotation to 60 deg or greater as needed for scanning environment and functional ROM Baseline: 08/10/23: Significant motion loss (see chart above).       09/25/23: Met for flexion, significant progress for bilat rotation with pt within 3-5 deg of goal.  Goal status: IN PROGRESS  2.  Pt will decrease worst neck pain by at least 2 points on the NPRS in order to demonstrate clinically significant reduction in neck pain. Baseline: 08/10/23: 10/10 at worst.    09/25/23: 10/10 at worst, 5/10 on average Goal status: NOT MET   3.  Pt will decrease NDI score by at least 19% in order demonstrate clinically significant reduction in neck pain/disability.       Baseline: 08/10/23: 34%.    09/25/23: 10/50 = 20% Goal status: IN PROGRESS  4.  Pt will improve BERG by 3 points or greater indicative of clinically meaningful improvement in pt's balance and fall risk Baseline: 08/10/23: BERG to be completed at visit # 2-3.    08/21/23: 49/56.    09/25/23: 53/56 Goal status: ACHIEVED    PLAN: PT FREQUENCY: 1-2x/week  PT DURATION: 3-4 weeks  PLANNED INTERVENTIONS: Therapeutic exercises, Therapeutic activity, Neuromuscular re-education, Balance training, Gait training, Patient/Family education, Self Care, Joint mobilization, Joint manipulation, Vestibular training, Canalith repositioning, Orthotic/Fit training, DME instructions, Dry Needling, Electrical stimulation, Spinal manipulation, Spinal mobilization, Cryotherapy, Moist heat, Taping, Traction,  Ultrasound, Ionotophoresis 4mg /ml Dexamethasone, Manual therapy, and Re-evaluation.  PLAN FOR NEXT SESSION: Manual techniques for L>R upper trap, levator scapulae, rhomboid musculature. MET and mobilization techniques to improve C-spine AROM. Update HEP for upper quarter pain or strengthening/balance training prn.    Venetia Endo, PT, DPT #[id number]  Venetia ONEIDA Endo, PT 10/03/2023, 3:13 PM

## 2023-10-10 ENCOUNTER — Ambulatory Visit: Admitting: Physical Therapy

## 2023-10-10 ENCOUNTER — Encounter: Payer: Self-pay | Admitting: Physical Therapy

## 2023-10-10 DIAGNOSIS — R262 Difficulty in walking, not elsewhere classified: Secondary | ICD-10-CM

## 2023-10-10 DIAGNOSIS — M542 Cervicalgia: Secondary | ICD-10-CM | POA: Diagnosis not present

## 2023-10-10 DIAGNOSIS — M62838 Other muscle spasm: Secondary | ICD-10-CM

## 2023-10-10 DIAGNOSIS — R2689 Other abnormalities of gait and mobility: Secondary | ICD-10-CM

## 2023-10-10 NOTE — Therapy (Signed)
 OUTPATIENT PHYSICAL THERAPY TREATMENT   Patient Name: Cheryl Briggs MRN: [id number] DOB:15-Jun-1945, 78 y.o., female Today's Date: 10/10/2023  END OF SESSION:  PT End of Session - 10/10/23 1522     Visit Number 12    Number of Visits 17    Date for Recertification  10/05/23    PT Start Time 1521    PT Stop Time 1600    PT Time Calculation (min) 39 min    Activity Tolerance Patient tolerated treatment well    Behavior During Therapy WFL for tasks assessed/performed           Past Medical History:  Diagnosis Date   Allergy    Arthritis    Cancer (HCC)    CLL   GERD (gastroesophageal reflux disease)    Myocardial infarction [facility])    Past Surgical History:  Procedure Laterality Date   BACK SURGERY     BLADDER REPAIR     CARDIAC CATHETERIZATION     carpel tunnell Bilateral    CHOLECYSTECTOMY     COLONOSCOPY  2015   COLONOSCOPY WITH PROPOFOL  N/A 10/05/2022   Procedure: COLONOSCOPY WITH PROPOFOL ;  Surgeon: Therisa Bi, MD;  Location: [facility] ENDOSCOPY;  Service: Gastroenterology;  Laterality: N/A;   CORONARY STENT INTERVENTION N/A 08/16/2019   Procedure: CORONARY STENT INTERVENTION;  Surgeon: Mady Bruckner, MD;  Location: [facility] INVASIVE CV LAB;  Service: Cardiovascular;  Laterality: N/A;   CORONARY STENT INTERVENTION Left 08/19/2019   Procedure: CORONARY STENT INTERVENTION;  Surgeon: Darron Deatrice LABOR, MD;  Location: [facility] INVASIVE CV LAB;  Service: Cardiovascular;  Laterality: Left;   CORONARY STENT INTERVENTION N/A 02/25/2020   Procedure: CORONARY STENT INTERVENTION;  Surgeon: Mady Bruckner, MD;  Location: [facility] INVASIVE CV LAB;  Service: Cardiovascular;  Laterality: N/A;   LEFT HEART CATH AND CORONARY ANGIOGRAPHY N/A 08/16/2019   Procedure: LEFT HEART CATH AND CORONARY ANGIOGRAPHY;  Surgeon: Mady Bruckner, MD;  Location: [facility] INVASIVE CV LAB;  Service: Cardiovascular;  Laterality: N/A;   LEFT HEART CATH AND CORONARY ANGIOGRAPHY N/A 02/25/2020   Procedure: LEFT HEART CATH  AND CORONARY ANGIOGRAPHY;  Surgeon: Mady Bruckner, MD;  Location: [facility] INVASIVE CV LAB;  Service: Cardiovascular;  Laterality: N/A;   RIGHT/LEFT HEART CATH AND CORONARY ANGIOGRAPHY N/A 06/18/2020   Procedure: RIGHT/LEFT HEART CATH AND CORONARY ANGIOGRAPHY;  Surgeon: Darron Deatrice LABOR, MD;  Location: [facility] INVASIVE CV LAB;  Service: Cardiovascular;  Laterality: N/A;   SHOULDER SURGERY Bilateral    UPPER EXTREMITY ANGIOGRAPHY Right 08/21/2019   Procedure: UPPER EXTREMITY ANGIOGRAPH;  Surgeon: Marea Selinda RAMAN, MD;  Location: [facility] INVASIVE CV LAB;  Service: Cardiovascular;  Laterality: Right;   Patient Active Problem List   Diagnosis Date Noted   Colon cancer screening 10/05/2022   Myocardial infarction (HCC) 08/19/2022   Degenerative joint disease involving multiple joints on both sides of body 05/11/2021   Acute non-ST segment elevation myocardial infarction (HCC) 11/30/2020   Prediabetes 11/30/2020   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   Tachycardia 11/29/2020   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)    Dyspnea    Hyperlipidemia    Gastroesophageal reflux disease without esophagitis    Chest pain 02/25/2020   Neutrophilic leukocytosis 02/25/2020   Essential hypertension    Atrial fibrillation with RVR (HCC) 08/16/2019   NSTEMI (non-ST elevated myocardial infarction) (HCC) 08/15/2019   Hematoma 08/15/2019   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 Atraumatic  onset. Pt has referral for neck pain/spasm and balance - no Hx of falls. Pt reports being unbalanced. Pt reports being told by referring provider he thought neck was result of muscle spasm. Hx of L RCR x 3. Patient reports not being as strong in L arm/shoulder. Pt reports difficulty with turning her head. Pt reports she has to hold her head still. L>R-sided neck pain. Pt reports pain/tingling along L suboccipital region with referral in ram's horn pattern around L ear. Pt reports trying to massage neck without relief.   Pain:  Pain Intensity: Present: 5/10, Best: 0/10, Worst: 10/10 Pain location: L SCM/L cervical paraspinal musculature  Pain Quality: aching  Radiating: Yes, around L suboccipital/peri-aural region Numbness/Tingling: Yes; into L suboccipital region, L hand can go numb with holding elbow in flexed position lon enough  Focal Weakness: No Aggravating factors: L rotation worse than R rotation, overhead activity  Relieving factors: Biofreeze, heat, refraining from notable movement 24-hour pain behavior: None  History of prior neck injury, pain, surgery, or therapy: Yes; Right RCR x 1, L RCR x 3; pt given home exercise sheet in past  Dominant hand: right Imaging: No   Red flags (personal history of cancer, h/o spinal tumors, history of compression fracture, chills/fever, night sweats, nausea, vomiting, unrelenting pain):  Hx of chronic lymphcytic leukemia  PRECAUTIONS: None  WEIGHT BEARING RESTRICTIONS: No  FALLS: Has patient fallen in last 6 months? 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   Patient Goals: Help my neck     OBJECTIVE (data from initial evaluation unless otherwise dated):    Patient Surveys  NDI = 17/50 = 34%  Posture L scapula sits higher than R at rest  AROM AROM (Normal range in degrees) AROM 08/10/23 AROM 09/25/23  Cervical   Flexion (50) 28* 52  Extension (80) 30 32  Right lateral flexion (45) 8 41  Left lateral flexion (45) 12 41  Right rotation (85) 20 57  Left rotation (85) 20 55  (* = pain; Blank rows = not tested)   Shoulder AROM: Flexion R 130 L 120; ABD 130 bilat; Functional ER: R T2, L C7; Functional IR: R T12, L T12   MMT MMT (out of 5) Right 08/10/23 Left 08/10/23      Shoulder   Flexion 4- 4-  Extension    Abduction 4- 4- (mild pain L arm)  Internal rotation 5 5  External rotation 4- 4-  Horizontal abduction    Horizontal adduction    Lower Trapezius    Rhomboids        Elbow  Flexion 5 5  Extension 4+ 4+  Pronation    Supination        Wrist  Flexion    Extension 5 5  Radial deviation    Ulnar deviation        (* = pain; Blank rows = not tested)  Reflexes R/L Elbow: 2+/1+  Brachioradialis: 2+/2+  Tricep: 2+/1+  Palpation Location LEFT  RIGHT           Suboccipitals    Cervical paraspinals 1 1  Upper Trapezius 2 2  Levator Scapulae 2 2  Rhomboid Major/Minor 2 2  (Blank rows = not tested) Graded on 0-4 scale (0 = no pain, 1 = pain, 2 = pain with wincing/grimacing/flinching, 3 = pain with withdrawal, 4 = unwilling to allow palpation), (Blank rows = not tested)  Repeated Movements Deferred   Passive Accessory Intervertebral Motion Decreased C3-6 R to L sideglide, Decreased C3-5 L to R sideglide.     SPECIAL TESTS Spurlings A (ipsilateral lateral flexion/axial compression): R: Localized neck pain, no upper limb pain/paresthesias,  L: Localized neck pain, no upper limb pain/paresthesias Distraction  Test: Negative  Hoffman Sign (cervical cord compression): R: Not examined L: Not examined ULTT Median: R: Not examined L: Not examined ULTT Ulnar: R: Not examined L: Not examined ULTT Radial: R: Not examined L: Not examined   OUTCOME MEASURES 5TSTS: 10.1 sec TUG: 9.2    BERG: 49/56 (08/21/23)   ____________________________    Clinical Test of Sensory Interaction for Balance    (CTSIB):  CONDITION TIME STRATEGY SWAY  Eyes open, firm surface 30 seconds ankle   Eyes closed, firm surface 30 seconds ankle   Eyes open, foam surface 30 seconds ankle   Eyes closed, foam surface 30 seconds ankle      FUNCTIONAL TASKS  Gait: Normal step cadence, shortened stride length, mild forward flexed posture  Sit to stand: Readily performed with no upper limb support on first attempted, no heavy trunk lean   Stair negotiation: Safe reciprocal stair negotiation, no UE support needed    LE Strength* R/L 4/4- Hip flexion 4+/4+ Hip abduction (seated) 5/5 Hip adduction (seated) 4+/4+ Knee extension 5/5 Knee flexion 4-/4 Ankle Dorsiflexion 4/4 Ankle Plantarflexion *indicates pain     TODAY'S TREATMENT    10/10/2023   SUBJECTIVE STATEMENT:   Patient reports doing well with HEP and stretches. She reports similar region of pain along L SCM/lateral paracervical region. Patient reports L side of neck wasn't bad this AM, but it is hurting more this afternoon. Pt reports she's been doing well until this afternoon. 5/10 pain.     Manual Therapy - for symptom modulation, soft tissue sensitivity and mobility, joint mobility, ROM   STM/DTM L>R upper trap, levator scapulae, C3-6 splenius cervicis/capitis, L SCM, L scalene x 15 minutes   -emphasis on L SCM closer to insertion today TPR bilat L upper trap; x 3 minute bout   Sideglide C3-6 bilaterally; 3 x 30 sec bouts  Passive sidebend stretch to R with scapular depression; 2 x 30 sec   *not today* Gentle passive cervical spine rotation with  contralateral anterior facet glide; x 5 ea dir MET for cervical rotation with isometric contraction of antagonist muscle group; x 5 for R and L rotation Attempted C2-3 SNAG with rotation, consistent pain with pressure onto upper cervical facets - discontinued Suboccipital STM; x 5 minutes  MHP (unbilled) utilized during manual therapy for analgesic effect and improved soft tissue extensibility, x 5 min     Therapeutic  Exercise - for improved soft tissue flexibility and extensibility as needed for ROM, periscapular isometrics and isotonics to improve postural endurance; upper limb AROM/AAROM as needed for reaching    Standing shoulder flexion AAROM with wand; 1x15, back to wall for postural cue  Bow and arrow; 1 x 10 either direction  PATIENT EDUCATION: Reviewed HEP and discussed expectations moving forward with PT/POC.    *not today* Self-SNAG with towel for cervical spine rotation; reviewed Wall slide with bilat UE c towel; x15, shoulder flexion AAROM Pulleys; x 2 min for shoulder forward elevation AAROM Standing high row with Red Tband; x15, for eccentric phase AAROM for forward elevation Supine shoulder flexion AAROM with wand; 1x10  -cueing for ensuring minimal pain with ROM with some axillary discomfort at full end-range Sit to stand; reviewed  Standing HR/TR; 1 x 10 - for HEP review    PATIENT EDUCATION:  Education details: see above for patient education details Person educated: Patient Education method: Explanation, Demonstration, and Handouts Education comprehension: verbalized understanding and returned demonstration   HOME EXERCISE PROGRAM:  Access Code: [email or web address] URL: [email or web address] Date: 08/16/2023 Prepared by: Venetia Endo  Exercises - Seated Upper Trapezius Stretch  - 2 x daily - 7 x weekly - 3 sets - 30sec hold - Seated Levator Scapulae Stretch  - 2 x daily - 7 x weekly - 3 sets - 30sec hold - Seated Scapular Retraction  - 2 x daily  - 7 x weekly - 2 sets - 10 reps - 5sec hold - Seated Assisted Cervical Rotation with Towel  - 2 x daily - 7 x weekly - 2 sets - 10 reps - 1sec hold  Access Code: [email or web address] URL: [email or web address] Date: 08/21/2023 Prepared by: Venetia Endo  Exercises - Heel Toe Raises with Counter Support  - 2 x daily - 7 x weekly - 2-3 sets - 10-15 reps - Sit to Stand Without Arm Support  - 2 x daily - 7 x weekly - 2-3 sets - 10 reps - Standing Tandem Balance with Counter Support  - 2 x daily - 7 x weekly - 3 sets - 30sec hold   ASSESSMENT:  CLINICAL IMPRESSION: Patient has made improvements to date, but she does have ongoing sensitivity affecting L scalene/paracervical region. Her R cervical rotation is WFL, but left C-spine rotation is still lacking 9 deg for functional ROM. She has notable c/o periscapular pain with repetitive overhead activity e.g. use of pulleys or high row/pulldown exercise. We introduced standing thoracic mobility work to improve mid-back stiffness and ability to complete shoulder elevation and C-spine AROM. Pt has remaining deficits in cervical spine and shoulder AROM, postural dysfunction, taut/tender L>R UT/LS, C-spine stiffness, and dec UE strength. Pt will continue to benefit from skilled PT services to address deficits and improve function.   OBJECTIVE IMPAIRMENTS: decreased ROM, decreased strength, hypomobility, increased muscle spasms, impaired flexibility, impaired UE functional use, postural dysfunction, and pain.   ACTIVITY LIMITATIONS: carrying, lifting, bending, and reach over head  PARTICIPATION LIMITATIONS: meal prep, cleaning, laundry, community activity, and yard work  PERSONAL FACTORS: Age, Past/current experiences, Time since onset of injury/illness/exacerbation, and 3+ comorbidities: (Hx of MI, GERD, OA, chronic lymphocytic leukemia)  are also affecting patient's functional outcome.   REHAB POTENTIAL: Good  CLINICAL DECISION MAKING:  Evolving/moderate complexity  EVALUATION COMPLEXITY: Moderate   GOALS: Goals reviewed with patient? Yes  SHORT TERM GOALS: Target date: 08/31/2023  Pt will be independent with HEP to improve strength and decrease neck pain to improve pain-free  function at home and work. Baseline: 08/10/23: Baseline HEP initiated.       09/25/23: Pt is keeping up senior group fitness classes; pt is not compliant with prescribed exercises as given on printout.  Goal status: NOT MET    LONG TERM GOALS: Target date: 10/05/2023  Pt will demonstrate cervical spine flexion to 50 deg and bilat rotation to 60 deg or greater as needed for scanning environment and functional ROM Baseline: 08/10/23: Significant motion loss (see chart above).       09/25/23: Met for flexion, significant progress for bilat rotation with pt within 3-5 deg of goal.  Goal status: IN PROGRESS  2.  Pt will decrease worst neck pain by at least 2 points on the NPRS in order to demonstrate clinically significant reduction in neck pain. Baseline: 08/10/23: 10/10 at worst.    09/25/23: 10/10 at worst, 5/10 on average Goal status: NOT MET   3.  Pt will decrease NDI score by at least 19% in order demonstrate clinically significant reduction in neck pain/disability.       Baseline: 08/10/23: 34%.    09/25/23: 10/50 = 20% Goal status: IN PROGRESS  4.  Pt will improve BERG by 3 points or greater indicative of clinically meaningful improvement in pt's balance and fall risk Baseline: 08/10/23: BERG to be completed at visit # 2-3.    08/21/23: 49/56.    09/25/23: 53/56 Goal status: ACHIEVED    PLAN: PT FREQUENCY: 1-2x/week  PT DURATION: 3-4 weeks  PLANNED INTERVENTIONS: Therapeutic exercises, Therapeutic activity, Neuromuscular re-education, Balance training, Gait training, Patient/Family education, Self Care, Joint mobilization, Joint manipulation, Vestibular training, Canalith repositioning, Orthotic/Fit training, DME instructions, Dry Needling,  Electrical stimulation, Spinal manipulation, Spinal mobilization, Cryotherapy, Moist heat, Taping, Traction, Ultrasound, Ionotophoresis 4mg /ml Dexamethasone, Manual therapy, and Re-evaluation.  PLAN FOR NEXT SESSION: Manual techniques for L>R upper trap, levator scapulae, rhomboid musculature. MET and mobilization techniques to improve C-spine AROM. Update HEP for upper quarter pain or strengthening/balance training prn.    Venetia Endo, PT, DPT #[id number]  Venetia ONEIDA Endo, PT 10/10/2023, 3:23 PM

## 2023-10-19 ENCOUNTER — Ambulatory Visit: Attending: Family Medicine | Admitting: Physical Therapy

## 2023-10-19 DIAGNOSIS — R2689 Other abnormalities of gait and mobility: Secondary | ICD-10-CM | POA: Diagnosis present

## 2023-10-19 DIAGNOSIS — M542 Cervicalgia: Secondary | ICD-10-CM | POA: Insufficient documentation

## 2023-10-19 DIAGNOSIS — M62838 Other muscle spasm: Secondary | ICD-10-CM | POA: Diagnosis present

## 2023-10-19 DIAGNOSIS — R262 Difficulty in walking, not elsewhere classified: Secondary | ICD-10-CM | POA: Insufficient documentation

## 2023-10-19 NOTE — Therapy (Signed)
 OUTPATIENT PHYSICAL THERAPY TREATMENT   Patient Name: Cheryl Briggs MRN: [id number] DOB:1945/01/29, 78 y.o., female Today's Date: 10/19/2023  END OF SESSION:  PT End of Session - 10/19/23 1234     Visit Number 13    Number of Visits 17    Date for Recertification  10/05/23    PT Start Time 1242    PT Stop Time 1321    PT Time Calculation (min) 39 min    Activity Tolerance Patient tolerated treatment well    Behavior During Therapy WFL for tasks assessed/performed           Past Medical History:  Diagnosis Date   Allergy    Arthritis    Cancer (HCC)    CLL   GERD (gastroesophageal reflux disease)    Myocardial infarction [facility])    Past Surgical History:  Procedure Laterality Date   BACK SURGERY     BLADDER REPAIR     CARDIAC CATHETERIZATION     carpel tunnell Bilateral    CHOLECYSTECTOMY     COLONOSCOPY  2015   COLONOSCOPY WITH PROPOFOL  N/A 10/05/2022   Procedure: COLONOSCOPY WITH PROPOFOL ;  Surgeon: Therisa Bi, MD;  Location: [facility] ENDOSCOPY;  Service: Gastroenterology;  Laterality: N/A;   CORONARY STENT INTERVENTION N/A 08/16/2019   Procedure: CORONARY STENT INTERVENTION;  Surgeon: Mady Bruckner, MD;  Location: [facility] INVASIVE CV LAB;  Service: Cardiovascular;  Laterality: N/A;   CORONARY STENT INTERVENTION Left 08/19/2019   Procedure: CORONARY STENT INTERVENTION;  Surgeon: Darron Deatrice LABOR, MD;  Location: [facility] INVASIVE CV LAB;  Service: Cardiovascular;  Laterality: Left;   CORONARY STENT INTERVENTION N/A 02/25/2020   Procedure: CORONARY STENT INTERVENTION;  Surgeon: Mady Bruckner, MD;  Location: [facility] INVASIVE CV LAB;  Service: Cardiovascular;  Laterality: N/A;   LEFT HEART CATH AND CORONARY ANGIOGRAPHY N/A 08/16/2019   Procedure: LEFT HEART CATH AND CORONARY ANGIOGRAPHY;  Surgeon: Mady Bruckner, MD;  Location: [facility] INVASIVE CV LAB;  Service: Cardiovascular;  Laterality: N/A;   LEFT HEART CATH AND CORONARY ANGIOGRAPHY N/A 02/25/2020   Procedure: LEFT HEART CATH  AND CORONARY ANGIOGRAPHY;  Surgeon: Mady Bruckner, MD;  Location: [facility] INVASIVE CV LAB;  Service: Cardiovascular;  Laterality: N/A;   RIGHT/LEFT HEART CATH AND CORONARY ANGIOGRAPHY N/A 06/18/2020   Procedure: RIGHT/LEFT HEART CATH AND CORONARY ANGIOGRAPHY;  Surgeon: Darron Deatrice LABOR, MD;  Location: [facility] INVASIVE CV LAB;  Service: Cardiovascular;  Laterality: N/A;   SHOULDER SURGERY Bilateral    UPPER EXTREMITY ANGIOGRAPHY Right 08/21/2019   Procedure: UPPER EXTREMITY ANGIOGRAPH;  Surgeon: Marea Selinda RAMAN, MD;  Location: [facility] INVASIVE CV LAB;  Service: Cardiovascular;  Laterality: Right;   Patient Active Problem List   Diagnosis Date Noted   Colon cancer screening 10/05/2022   Myocardial infarction (HCC) 08/19/2022   Degenerative joint disease involving multiple joints on both sides of body 05/11/2021   Acute non-ST segment elevation myocardial infarction (HCC) 11/30/2020   Prediabetes 11/30/2020   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   Tachycardia 11/29/2020   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)    Dyspnea    Hyperlipidemia    Gastroesophageal reflux disease without esophagitis    Chest pain 02/25/2020   Neutrophilic leukocytosis 02/25/2020   Essential hypertension    Atrial fibrillation with RVR (HCC) 08/16/2019   NSTEMI (non-ST elevated myocardial infarction) (HCC) 08/15/2019   Hematoma 08/15/2019   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 Atraumatic onset. Pt has referral for neck pain/spasm and balance - no Hx  of falls. Pt reports being unbalanced. Pt reports being told by referring provider he thought neck was result of muscle spasm. Hx of L RCR x 3. Patient reports not being as strong in L arm/shoulder. Pt reports difficulty with turning her head. Pt reports she has to hold her head still. L>R-sided neck pain. Pt reports pain/tingling along L suboccipital region with referral in ram's horn pattern around L ear. Pt reports trying to massage neck without relief.   Pain:  Pain Intensity: Present: 5/10, Best: 0/10, Worst: 10/10 Pain location: L SCM/L cervical paraspinal musculature  Pain Quality: aching  Radiating: Yes, around L suboccipital/peri-aural region Numbness/Tingling: Yes; into L suboccipital region, L hand can go numb with holding elbow in flexed position lon enough  Focal Weakness: No Aggravating factors: L rotation worse than R rotation, overhead activity  Relieving factors: Biofreeze, heat, refraining from notable movement 24-hour pain behavior: None  History of prior neck injury, pain, surgery, or therapy: Yes; Right RCR x 1, L RCR x 3; pt given home exercise sheet in past  Dominant hand: right Imaging: No   Red flags (personal history of cancer, h/o spinal tumors, history of compression fracture, chills/fever, night sweats, nausea, vomiting, unrelenting pain):  Hx of chronic lymphcytic leukemia  PRECAUTIONS: None  WEIGHT BEARING RESTRICTIONS: No  FALLS: Has patient fallen in last 6 months? 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   Patient Goals: Help my neck     OBJECTIVE (data from initial evaluation unless otherwise dated):    Patient Surveys  NDI = 17/50 = 34%  Posture L scapula sits higher than R at rest  AROM AROM (Normal range in degrees) AROM 08/10/23 AROM 09/25/23  Cervical   Flexion (50) 28* 52  Extension (80) 30 32  Right lateral flexion (45) 8 41  Left lateral flexion (45) 12 41  Right rotation (85) 20 57  Left rotation (85) 20 55  (* = pain; Blank rows = not tested)   Shoulder AROM: Flexion R 130 L 120; ABD 130 bilat; Functional ER: R T2, L C7; Functional IR: R T12, L T12   MMT MMT (out of 5) Right 08/10/23 Left 08/10/23      Shoulder   Flexion 4- 4-  Extension    Abduction 4- 4- (mild pain L arm)  Internal rotation 5 5  External rotation 4- 4-  Horizontal abduction    Horizontal adduction    Lower Trapezius    Rhomboids        Elbow  Flexion 5 5  Extension 4+ 4+  Pronation  Supination        Wrist  Flexion    Extension 5 5  Radial deviation    Ulnar deviation        (* = pain; Blank rows = not tested)  Reflexes R/L Elbow: 2+/1+  Brachioradialis: 2+/2+  Tricep: 2+/1+  Palpation Location LEFT  RIGHT           Suboccipitals    Cervical paraspinals 1 1  Upper Trapezius 2 2  Levator Scapulae 2 2  Rhomboid Major/Minor 2 2  (Blank rows = not tested) Graded on 0-4 scale (0 = no pain, 1 = pain, 2 = pain with wincing/grimacing/flinching, 3 = pain with withdrawal, 4 = unwilling to allow palpation), (Blank rows = not tested)  Repeated Movements Deferred   Passive Accessory Intervertebral Motion Decreased C3-6 R to L sideglide, Decreased C3-5 L to R sideglide.     SPECIAL TESTS Spurlings A (ipsilateral lateral flexion/axial compression): R: Localized neck pain, no upper limb pain/paresthesias,  L: Localized neck pain, no upper limb pain/paresthesias Distraction Test: Negative  Hoffman Sign (cervical cord compression): R: Not  examined L: Not examined ULTT Median: R: Not examined L: Not examined ULTT Ulnar: R: Not examined L: Not examined ULTT Radial: R: Not examined L: Not examined   OUTCOME MEASURES 5TSTS: 10.1 sec TUG: 9.2    BERG: 49/56 (08/21/23)   ____________________________    Clinical Test of Sensory Interaction for Balance    (CTSIB):  CONDITION TIME STRATEGY SWAY  Eyes open, firm surface 30 seconds ankle   Eyes closed, firm surface 30 seconds ankle   Eyes open, foam surface 30 seconds ankle   Eyes closed, foam surface 30 seconds ankle      FUNCTIONAL TASKS  Gait: Normal step cadence, shortened stride length, mild forward flexed posture  Sit to stand: Readily performed with no upper limb support on first attempted, no heavy trunk lean   Stair negotiation: Safe reciprocal stair negotiation, no UE support needed    LE Strength* R/L 4/4- Hip flexion 4+/4+ Hip abduction (seated) 5/5 Hip adduction (seated) 4+/4+ Knee extension 5/5 Knee flexion 4-/4 Ankle Dorsiflexion 4/4 Ankle Plantarflexion *indicates pain     TODAY'S TREATMENT    10/19/2023   SUBJECTIVE STATEMENT:   Patient reports getting some L paracervical pain with moving wrong. Patient reports doing relatively well with cervical rotation to merge in traffic. Patient reports minimal pain at baseline today.   C-spine AROM: Rotation R 65, L 60   Manual Therapy - for symptom modulation, soft tissue sensitivity and mobility, joint mobility, ROM   STM/DTM L>R upper trap, levator scapulae, C3-6 splenius cervicis/capitis, L SCM, L scalene x 15 minutes   -emphasis on L SCM closer to insertion today TPR bilat  upper trap; x 3 minute bout   Sideglide C3-6 bilaterally; 2 x 30 sec bouts   *not today* Passive sidebend stretch to R with scapular depression; 2 x 30 sec Gentle passive cervical spine rotation with contralateral anterior facet glide; x 5 ea dir MET for cervical rotation with isometric contraction of antagonist muscle  group; x 5 for R and L rotation Attempted C2-3 SNAG with rotation, consistent pain with pressure onto upper cervical facets - discontinued Suboccipital STM; x 5 minutes  MHP (unbilled) utilized during manual therapy for analgesic effect and improved soft tissue extensibility, x 5 min     Therapeutic Exercise - for improved soft tissue flexibility and extensibility as needed for ROM, periscapular isometrics and isotonics to improve postural  endurance; upper limb AROM/AAROM as needed for reaching    Standing shoulder flexion AAROM with wand; 2 x 10, back to wall for postural cue  Bow and arrow, in standing; 1 x 10 either direction  PATIENT EDUCATION: HEP update/review. Discussed use of thoracic mobility drills to improve C-spine and shoulder mobility. Discussed POC and upcoming final visit.    *not today* Self-SNAG with towel for cervical spine rotation; reviewed Wall slide with bilat UE c towel; x15, shoulder flexion AAROM Pulleys; x 2 min for shoulder forward elevation AAROM Standing high row with Red Tband; x15, for eccentric phase AAROM for forward elevation Supine shoulder flexion AAROM with wand; 1x10  -cueing for ensuring minimal pain with ROM with some axillary discomfort at full end-range Sit to stand; reviewed  Standing HR/TR; 1 x 10 - for HEP review    PATIENT EDUCATION:  Education details: see above for patient education details Person educated: Patient Education method: Explanation, Demonstration, and Handouts Education comprehension: verbalized understanding and returned demonstration   HOME EXERCISE PROGRAM:  Access Code: [email or web address] URL: [email or web address] Date: 10/19/2023 Prepared by: Venetia Endo  Exercises - Seated Upper Trapezius Stretch  - 2 x daily - 7 x weekly - 3 sets - 30sec hold - Seated Levator Scapulae Stretch  - 2 x daily - 7 x weekly - 3 sets - 30sec hold - Seated Scapular Retraction  - 2 x daily - 7 x weekly - 2 sets - 10 reps  - 5sec hold - Seated Assisted Cervical Rotation with Towel  - 2 x daily - 7 x weekly - 2 sets - 10 reps - 1sec hold - Sidelying Bow and Arrow Stretch  - 2 x daily - 7 x weekly - 2 sets - 10 reps - 2sec hold  Access Code: [email or web address] URL: [email or web address] Date: 08/21/2023 Prepared by: Venetia Endo  Exercises - Heel Toe Raises with Counter Support  - 2 x daily - 7 x weekly - 2-3 sets - 10-15 reps - Sit to Stand Without Arm Support  - 2 x daily - 7 x weekly - 2-3 sets - 10 reps - Standing Tandem Balance with Counter Support  - 2 x daily - 7 x weekly - 3 sets - 30sec hold   ASSESSMENT:  CLINICAL IMPRESSION: Patient exhibits WFL cervical spine rotation and other planes of motion normalizing compared to severe motion limitation and stiffness at eval. Patient does have ongoing chronic shoulder mobility deficits and intermittent shoulder/upper arm pain associated with failed RCR on L side. We have worked on [employer] to prevent upper trap compensation, and her program was updated to include thoracic mobility work to improve ability to access cervical spine and shoulder ROM.  Pt will continue to benefit from skilled PT services to address deficits and improve function.   OBJECTIVE IMPAIRMENTS: decreased ROM, decreased strength, hypomobility, increased muscle spasms, impaired flexibility, impaired UE functional use, postural dysfunction, and pain.   ACTIVITY LIMITATIONS: carrying, lifting, bending, and reach over head  PARTICIPATION LIMITATIONS: meal prep, cleaning, laundry, community activity, and yard work  PERSONAL FACTORS: Age, Past/current experiences, Time since onset of injury/illness/exacerbation, and 3+ comorbidities: (Hx of MI, GERD, OA, chronic lymphocytic leukemia)  are also affecting patient's functional outcome.   REHAB POTENTIAL: Good  CLINICAL DECISION MAKING: Evolving/moderate complexity  EVALUATION COMPLEXITY: Moderate   GOALS: Goals reviewed with  patient? Yes  SHORT TERM GOALS: Target date: 08/31/2023  Pt will be independent with HEP to improve strength and decrease neck pain to improve pain-free  function at home and work. Baseline: 08/10/23: Baseline HEP initiated.       09/25/23: Pt is keeping up senior group fitness classes; pt is not compliant with prescribed exercises as given on printout.  Goal status: NOT MET    LONG TERM GOALS: Target date: 10/05/2023  Pt will demonstrate cervical spine flexion to 50 deg and bilat rotation to 60 deg or greater as needed for scanning environment and functional ROM Baseline: 08/10/23: Significant motion loss (see chart above).       09/25/23: Met for flexion, significant progress for bilat rotation with pt within 3-5 deg of goal.  Goal status: IN PROGRESS  2.  Pt will decrease worst neck pain by at least 2 points on the NPRS in order to demonstrate clinically significant reduction in neck pain. Baseline: 08/10/23: 10/10 at worst.    09/25/23: 10/10 at worst, 5/10 on average Goal status: NOT MET   3.  Pt will decrease NDI score by at least 19% in order demonstrate clinically significant reduction in neck pain/disability.       Baseline: 08/10/23: 34%.    09/25/23: 10/50 = 20% Goal status: IN PROGRESS  4.  Pt will improve BERG by 3 points or greater indicative of clinically meaningful improvement in pt's balance and fall risk Baseline: 08/10/23: BERG to be completed at visit # 2-3.    08/21/23: 49/56.    09/25/23: 53/56 Goal status: ACHIEVED    PLAN: PT FREQUENCY: 1-2x/week  PT DURATION: 3-4 weeks  PLANNED INTERVENTIONS: Therapeutic exercises, Therapeutic activity, Neuromuscular re-education, Balance training, Gait training, Patient/Family education, Self Care, Joint mobilization, Joint manipulation, Vestibular training, Canalith repositioning, Orthotic/Fit training, DME instructions, Dry Needling, Electrical stimulation, Spinal manipulation, Spinal mobilization, Cryotherapy, Moist heat, Taping,  Traction, Ultrasound, Ionotophoresis 4mg /ml Dexamethasone, Manual therapy, and Re-evaluation.  PLAN FOR NEXT SESSION: Manual techniques for L>R upper trap, levator scapulae, rhomboid musculature. MET and mobilization techniques to improve C-spine AROM. Update HEP for upper quarter pain or strengthening/balance training prn.    Venetia Endo, PT, DPT #[id number]  Venetia ONEIDA Endo, PT 10/19/2023, 1:30 PM

## 2023-10-26 ENCOUNTER — Ambulatory Visit: Admitting: Physical Therapy

## 2023-10-26 DIAGNOSIS — M542 Cervicalgia: Secondary | ICD-10-CM

## 2023-10-26 DIAGNOSIS — M62838 Other muscle spasm: Secondary | ICD-10-CM

## 2023-10-26 DIAGNOSIS — R262 Difficulty in walking, not elsewhere classified: Secondary | ICD-10-CM

## 2023-10-26 DIAGNOSIS — R2689 Other abnormalities of gait and mobility: Secondary | ICD-10-CM

## 2023-10-26 NOTE — Therapy (Addendum)
 OUTPATIENT PHYSICAL THERAPY TREATMENT/GOAL UPDATE AND DISCHARGE   Patient Name: Cheryl Briggs MRN: [id number] DOB:Mar 27, 1945, 78 y.o., female Today's Date: 10/26/2023  END OF SESSION:  PT End of Session - 10/26/23 1258     Visit Number 14    Number of Visits 17    Date for Recertification  10/05/23    PT Start Time 1259    PT Stop Time 1338    PT Time Calculation (min) 39 min    Activity Tolerance Patient tolerated treatment well    Behavior During Therapy WFL for tasks assessed/performed            Past Medical History:  Diagnosis Date   Allergy    Arthritis    Cancer (HCC)    CLL   GERD (gastroesophageal reflux disease)    Myocardial infarction [facility])    Past Surgical History:  Procedure Laterality Date   BACK SURGERY     BLADDER REPAIR     CARDIAC CATHETERIZATION     carpel tunnell Bilateral    CHOLECYSTECTOMY     COLONOSCOPY  2015   COLONOSCOPY WITH PROPOFOL  N/A 10/05/2022   Procedure: COLONOSCOPY WITH PROPOFOL ;  Surgeon: Therisa Bi, MD;  Location: [facility] ENDOSCOPY;  Service: Gastroenterology;  Laterality: N/A;   CORONARY STENT INTERVENTION N/A 08/16/2019   Procedure: CORONARY STENT INTERVENTION;  Surgeon: Mady Bruckner, MD;  Location: [facility] INVASIVE CV LAB;  Service: Cardiovascular;  Laterality: N/A;   CORONARY STENT INTERVENTION Left 08/19/2019   Procedure: CORONARY STENT INTERVENTION;  Surgeon: Darron Deatrice LABOR, MD;  Location: [facility] INVASIVE CV LAB;  Service: Cardiovascular;  Laterality: Left;   CORONARY STENT INTERVENTION N/A 02/25/2020   Procedure: CORONARY STENT INTERVENTION;  Surgeon: Mady Bruckner, MD;  Location: [facility] INVASIVE CV LAB;  Service: Cardiovascular;  Laterality: N/A;   LEFT HEART CATH AND CORONARY ANGIOGRAPHY N/A 08/16/2019   Procedure: LEFT HEART CATH AND CORONARY ANGIOGRAPHY;  Surgeon: Mady Bruckner, MD;  Location: [facility] INVASIVE CV LAB;  Service: Cardiovascular;  Laterality: N/A;   LEFT HEART CATH AND CORONARY ANGIOGRAPHY N/A 02/25/2020    Procedure: LEFT HEART CATH AND CORONARY ANGIOGRAPHY;  Surgeon: Mady Bruckner, MD;  Location: [facility] INVASIVE CV LAB;  Service: Cardiovascular;  Laterality: N/A;   RIGHT/LEFT HEART CATH AND CORONARY ANGIOGRAPHY N/A 06/18/2020   Procedure: RIGHT/LEFT HEART CATH AND CORONARY ANGIOGRAPHY;  Surgeon: Darron Deatrice LABOR, MD;  Location: [facility] INVASIVE CV LAB;  Service: Cardiovascular;  Laterality: N/A;   SHOULDER SURGERY Bilateral    UPPER EXTREMITY ANGIOGRAPHY Right 08/21/2019   Procedure: UPPER EXTREMITY ANGIOGRAPH;  Surgeon: Marea Selinda RAMAN, MD;  Location: [facility] INVASIVE CV LAB;  Service: Cardiovascular;  Laterality: Right;   Patient Active Problem List   Diagnosis Date Noted   Colon cancer screening 10/05/2022   Myocardial infarction (HCC) 08/19/2022   Degenerative joint disease involving multiple joints on both sides of body 05/11/2021   Acute non-ST segment elevation myocardial infarction [facility]) 11/30/2020   Prediabetes 11/30/2020   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   Tachycardia 11/29/2020   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)    Dyspnea    Hyperlipidemia    Gastroesophageal reflux disease without esophagitis    Chest pain 02/25/2020   Neutrophilic leukocytosis 02/25/2020   Essential hypertension    Atrial fibrillation with RVR (HCC) 08/16/2019   NSTEMI (non-ST elevated myocardial infarction) (HCC) 08/15/2019   Hematoma 08/15/2019   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 Atraumatic onset. Pt has referral for neck pain/spasm and balance - no Hx of falls. Pt reports being unbalanced. Pt reports being told by referring provider he thought neck was result of muscle spasm. Hx of L RCR x 3. Patient reports not being as strong in L arm/shoulder. Pt reports difficulty with turning her head. Pt reports she has to hold her head still. L>R-sided neck pain. Pt reports pain/tingling along L suboccipital region with referral in ram's horn pattern around L ear. Pt reports trying to massage neck without relief.   Pain:  Pain Intensity: Present: 5/10, Best: 0/10, Worst: 10/10 Pain location: L SCM/L cervical paraspinal musculature  Pain Quality: aching  Radiating: Yes, around L suboccipital/peri-aural region Numbness/Tingling: Yes; into L suboccipital region, L hand can go numb with holding elbow in flexed position lon enough  Focal Weakness: No Aggravating factors: L rotation worse than R rotation, overhead activity  Relieving factors: Biofreeze, heat, refraining from notable movement 24-hour pain behavior: None  History of prior neck injury, pain, surgery, or therapy: Yes; Right RCR x 1, L RCR x 3; pt given home exercise sheet in past  Dominant hand: right Imaging: No   Red flags (personal history of cancer, h/o spinal tumors, history of compression fracture, chills/fever, night sweats, nausea, vomiting, unrelenting pain):  Hx of chronic lymphcytic leukemia  PRECAUTIONS: None  WEIGHT BEARING RESTRICTIONS: No  FALLS: Has patient fallen in last 6 months? 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   Patient Goals: Help my neck     OBJECTIVE (data from initial evaluation unless otherwise dated):    Patient Surveys  NDI = 17/50 = 34%  Posture L scapula sits higher than R at rest  AROM AROM (Normal range in degrees) AROM 08/10/23 AROM 09/25/23 AROM 10/26/23  Cervical    Flexion (50) 28* 52 WNL  Extension (80) 30 32 40  Right lateral flexion (45) 8 41 40*  Left lateral flexion (45) 12 41 40*  Right rotation (85) 20 57 59  Left rotation (85) 20 55 57  (* = pain; Blank rows = not tested)   Shoulder AROM: Flexion R 130 L 120; ABD 130 bilat; Functional ER: R T2, L C7; Functional IR: R T12, L T12   MMT MMT (out of 5) Right 08/10/23 Left 08/10/23      Shoulder   Flexion 4- 4-  Extension    Abduction 4- 4- (mild pain L arm)  Internal rotation 5 5  External rotation 4- 4-  Horizontal abduction    Horizontal adduction    Lower Trapezius    Rhomboids  Elbow  Flexion 5 5  Extension 4+ 4+  Pronation    Supination        Wrist  Flexion    Extension 5 5  Radial deviation    Ulnar deviation        (* = pain; Blank rows = not tested)  Reflexes R/L Elbow: 2+/1+  Brachioradialis: 2+/2+  Tricep: 2+/1+  Palpation Location LEFT  RIGHT           Suboccipitals    Cervical paraspinals 1 1  Upper Trapezius 2 2  Levator Scapulae 2 2  Rhomboid Major/Minor 2 2  (Blank rows = not tested) Graded on 0-4 scale (0 = no pain, 1 = pain, 2 = pain with wincing/grimacing/flinching, 3 = pain with withdrawal, 4 = unwilling to allow palpation), (Blank rows = not tested)  Repeated Movements Deferred   Passive Accessory Intervertebral Motion Decreased C3-6 R to L sideglide, Decreased C3-5 L to R sideglide.     SPECIAL TESTS Spurlings A (ipsilateral lateral flexion/axial compression): R: Localized neck pain, no upper limb  pain/paresthesias,  L: Localized neck pain, no upper limb pain/paresthesias Distraction Test: Negative  Hoffman Sign (cervical cord compression): R: Not examined L: Not examined ULTT Median: R: Not examined L: Not examined ULTT Ulnar: R: Not examined L: Not examined ULTT Radial: R: Not examined L: Not examined   OUTCOME MEASURES 5TSTS: 10.1 sec TUG: 9.2    BERG: 49/56 (08/21/23)   ____________________________    Clinical Test of Sensory Interaction for Balance    (CTSIB):  CONDITION TIME STRATEGY SWAY  Eyes open, firm surface 30 seconds ankle   Eyes closed, firm surface 30 seconds ankle   Eyes open, foam surface 30 seconds ankle   Eyes closed, foam surface 30 seconds ankle      FUNCTIONAL TASKS  Gait: Normal step cadence, shortened stride length, mild forward flexed posture  Sit to stand: Readily performed with no upper limb support on first attempted, no heavy trunk lean   Stair negotiation: Safe reciprocal stair negotiation, no UE support needed    LE Strength* R/L 4/4- Hip flexion 4+/4+ Hip abduction (seated) 5/5 Hip adduction (seated) 4+/4+ Knee extension 5/5 Knee flexion 4-/4 Ankle Dorsiflexion 4/4 Ankle Plantarflexion *indicates pain     TODAY'S TREATMENT    10/26/2023   SUBJECTIVE STATEMENT:   Patient reports still having some degree of pain affecting L SCM region. Patient reports mild discomfort at rest, but she has notable catching with attempting to rotate neck further. Patient reports that heat helps in short term. Patient reports notable improvement to date given change in ROM. Pt reports no disturbed sleep or disrupted household ADLs. Pt reports she has to be careful with turning her head during driving, but it is better.     *GOAL UPDATE PERFORMED   Manual Therapy - for symptom modulation, soft tissue sensitivity and mobility, joint mobility, ROM   STM/DTM L>R upper trap, levator scapulae, C3-6 splenius cervicis/capitis, L SCM, L scalene x 15  minutes   -emphasis on L SCM closer to insertion today  Sideglide C3-6 bilaterally; 2 x 30 sec bouts Gentle passive cervical spine rotation with contralateral anterior facet glide; x 5 ea dir  *not today* Passive sidebend stretch to R with scapular depression; 2 x 30 sec MET for cervical rotation with isometric contraction of antagonist muscle group; x 5 for R and L rotation Attempted C2-3 SNAG with rotation, consistent pain with pressure onto upper cervical facets - discontinued Suboccipital STM; x  5 minutes  MHP (unbilled) utilized during manual therapy for analgesic effect and improved soft tissue extensibility, x 5 min     Therapeutic Exercise - for improved soft tissue flexibility and extensibility as needed for ROM, periscapular isometrics and isotonics to improve postural endurance; upper limb AROM/AAROM as needed for reaching      Sternocleidomastoid stretch; x 30 sec, bilat    Standing shoulder flexion AAROM with wand; reviewed  Bow and arrow, in standing; reviewed  PATIENT EDUCATION: HEP update/review. Discussed current progress and provided new handout for maintenance program.    *not today* Self-SNAG with towel for cervical spine rotation; reviewed Wall slide with bilat UE c towel; x15, shoulder flexion AAROM Pulleys; x 2 min for shoulder forward elevation AAROM Standing high row with Red Tband; x15, for eccentric phase AAROM for forward elevation Supine shoulder flexion AAROM with wand; 1x10  -cueing for ensuring minimal pain with ROM with some axillary discomfort at full end-range Sit to stand; reviewed  Standing HR/TR; 1 x 10 - for HEP review    PATIENT EDUCATION:  Education details: see above for patient education details Person educated: Patient Education method: Explanation, Demonstration, and Handouts Education comprehension: verbalized understanding and returned demonstration   HOME EXERCISE PROGRAM:  Access Code: [email or web address] URL:  [email or web address] Date: 10/26/2023 Prepared by: Venetia Endo  Exercises - Seated Upper Trapezius Stretch  - 2 x daily - 7 x weekly - 3 sets - 30sec hold - Seated Levator Scapulae Stretch  - 2 x daily - 7 x weekly - 3 sets - 30sec hold - Seated Scapular Retraction  - 2 x daily - 7 x weekly - 2 sets - 10 reps - 5sec hold - Seated Assisted Cervical Rotation with Towel  - 2 x daily - 7 x weekly - 2 sets - 10 reps - 1sec hold - Sidelying Bow and Arrow Stretch  - 2 x daily - 7 x weekly - 2 sets - 10 reps - 2sec hold - Sternocleidomastoid Stretch  - 2 x daily - 7 x weekly - 2-3 sets - 30sec hold   Access Code: [email or web address] URL: [email or web address] Date: 08/21/2023 Prepared by: Venetia Endo  Exercises - Heel Toe Raises with Counter Support  - 2 x daily - 7 x weekly - 2-3 sets - 10-15 reps - Sit to Stand Without Arm Support  - 2 x daily - 7 x weekly - 2-3 sets - 10 reps - Standing Tandem Balance with Counter Support  - 2 x daily - 7 x weekly - 3 sets - 30sec hold   ASSESSMENT:  CLINICAL IMPRESSION: Patient has largely [facility] cervical spine AROM at this time. She is just shy of functional ROM for cervical rotation in either direction. We have been able to attain 60+ deg for rotation in either direction, and she has functional sagittal plane ROM at this time without notable reproduction of neck pain. She has ongoing sensitivity along L scalenes/SCM; she has fair response with manual therapy, but this region intermittently irritates her with ADLs requiring scanning environment/turning head. Pt has met NDI goal and feels she has progressed to point she can continue with independent home program for neck mobility. She has previously passed fall risk screening and does not pose heightened fall risk at this time. Pt is appropriate for transition to HEP at this time, and she may f/u with PT as needed if her condition regresses or in the case of suboptimal  progress.   OBJECTIVE IMPAIRMENTS: decreased ROM, decreased strength,  hypomobility, increased muscle spasms, impaired flexibility, impaired UE functional use, postural dysfunction, and pain.   ACTIVITY LIMITATIONS: carrying, lifting, bending, and reach over head  PARTICIPATION LIMITATIONS: meal prep, cleaning, laundry, community activity, and yard work  PERSONAL FACTORS: Age, Past/current experiences, Time since onset of injury/illness/exacerbation, and 3+ comorbidities: (Hx of MI, GERD, OA, chronic lymphocytic leukemia)  are also affecting patient's functional outcome.   REHAB POTENTIAL: Good  CLINICAL DECISION MAKING: Evolving/moderate complexity  EVALUATION COMPLEXITY: Moderate   GOALS: Goals reviewed with patient? Yes  SHORT TERM GOALS: Target date: 08/31/2023  Pt will be independent with HEP to improve strength and decrease neck pain to improve pain-free function at home and work. Baseline: 08/10/23: Baseline HEP initiated.       09/25/23: Pt is keeping up senior group fitness classes; pt is not compliant with prescribed exercises as given on printout.      10/26/23: Pt is largely compliant with HEP, missing only some days due to childcare for great grandson.  Goal status: ACHIEVED    LONG TERM GOALS: Target date: 10/05/2023  Pt will demonstrate cervical spine flexion to 50 deg and bilat rotation to 60 deg or greater as needed for scanning environment and functional ROM Baseline: 08/10/23: Significant motion loss (see chart above).       09/25/23: Met for flexion, significant progress for bilat rotation with pt within 3-5 deg of goal.      10/26/23: Mostly met for rotation, met for flexion.  Goal status: PARTIALLY MET  2.  Pt will decrease worst neck pain by at least 2 points on the NPRS in order to demonstrate clinically significant reduction in neck pain. Baseline: 08/10/23: 10/10 at worst.    09/25/23: 10/10 at worst, 5/10 on average.      10/26/23: 10/10 intermittently with  moving head, 4-5/10 on average.  Goal status: NOT MET   3.  Pt will decrease NDI score by at least 19% in order demonstrate clinically significant reduction in neck pain/disability.       Baseline: 08/10/23: 34%.    09/25/23: 10/50 = 20%.    10/26/23: 0/50 = 0% Goal status: ACHIEVED   4.  Pt will improve BERG by 3 points or greater indicative of clinically meaningful improvement in pt's balance and fall risk Baseline: 08/10/23: BERG to be completed at visit # 2-3.    08/21/23: 49/56.    09/25/23: 53/56 Goal status: ACHIEVED    PLAN: PT FREQUENCY: -  PT DURATION: -  PLANNED INTERVENTIONS: Therapeutic exercises, Therapeutic activity, Neuromuscular re-education, Balance training, Gait training, Patient/Family education, Self Care, Joint mobilization, Joint manipulation, Vestibular training, Canalith repositioning, Orthotic/Fit training, DME instructions, Dry Needling, Electrical stimulation, Spinal manipulation, Spinal mobilization, Cryotherapy, Moist heat, Taping, Traction, Ultrasound, Ionotophoresis 4mg /ml Dexamethasone, Manual therapy, and Re-evaluation.  PLAN FOR NEXT SESSION: Pt to continue with maintenance HEP at this time. Discharge formal PT.     Venetia Endo, PT, DPT #[id number]  Venetia ONEIDA Endo, PT 10/26/2023, 1:45 PM

## 2023-10-30 ENCOUNTER — Encounter: Payer: Self-pay | Admitting: Physical Therapy

## 2024-01-16 DIAGNOSIS — Z1231 Encounter for screening mammogram for malignant neoplasm of breast: Secondary | ICD-10-CM

## 2024-01-31 ENCOUNTER — Other Ambulatory Visit: Payer: Self-pay | Admitting: Family Medicine

## 2024-01-31 DIAGNOSIS — R55 Syncope and collapse: Secondary | ICD-10-CM

## 2024-01-31 DIAGNOSIS — I2510 Atherosclerotic heart disease of native coronary artery without angina pectoris: Secondary | ICD-10-CM

## 2024-01-31 DIAGNOSIS — I10 Essential (primary) hypertension: Secondary | ICD-10-CM

## 2024-01-31 DIAGNOSIS — I214 Non-ST elevation (NSTEMI) myocardial infarction: Secondary | ICD-10-CM

## 2024-02-02 ENCOUNTER — Ambulatory Visit
Admission: RE | Admit: 2024-02-02 | Discharge: 2024-02-02 | Disposition: A | Source: Ambulatory Visit | Attending: Family Medicine | Admitting: Family Medicine

## 2024-02-02 DIAGNOSIS — I214 Non-ST elevation (NSTEMI) myocardial infarction: Secondary | ICD-10-CM | POA: Insufficient documentation

## 2024-02-02 DIAGNOSIS — I2510 Atherosclerotic heart disease of native coronary artery without angina pectoris: Secondary | ICD-10-CM | POA: Insufficient documentation

## 2024-02-02 DIAGNOSIS — R55 Syncope and collapse: Secondary | ICD-10-CM | POA: Insufficient documentation

## 2024-02-02 DIAGNOSIS — I10 Essential (primary) hypertension: Secondary | ICD-10-CM | POA: Insufficient documentation

## 2024-02-08 ENCOUNTER — Ambulatory Visit

## 2024-02-21 ENCOUNTER — Ambulatory Visit: Admitting: Cardiovascular Disease

## 2024-03-05 ENCOUNTER — Ambulatory Visit

## 2024-05-10 ENCOUNTER — Other Ambulatory Visit

## 2024-05-10 ENCOUNTER — Ambulatory Visit: Admitting: Nurse Practitioner

## 2024-05-13 ENCOUNTER — Other Ambulatory Visit

## 2024-05-13 ENCOUNTER — Ambulatory Visit: Admitting: Nurse Practitioner
